# Patient Record
Sex: Male | Born: 1970
Health system: Southern US, Community
[De-identification: ages and names within clinical notes are randomized; demographics above are authoritative.]

## PROBLEM LIST (undated history)

## (undated) ENCOUNTER — Emergency Department (HOSPITAL_COMMUNITY): Admission: EM | Payer: Self-pay | Source: Home / Self Care

## (undated) ENCOUNTER — Ambulatory Visit (HOSPITAL_COMMUNITY): Payer: BLUE CROSS/BLUE SHIELD

## (undated) DIAGNOSIS — C801 Malignant (primary) neoplasm, unspecified: Secondary | ICD-10-CM

## (undated) DIAGNOSIS — N2889 Other specified disorders of kidney and ureter: Secondary | ICD-10-CM

## (undated) DIAGNOSIS — B20 Human immunodeficiency virus [HIV] disease: Secondary | ICD-10-CM

## (undated) DIAGNOSIS — K5792 Diverticulitis of intestine, part unspecified, without perforation or abscess without bleeding: Secondary | ICD-10-CM

## (undated) DIAGNOSIS — I10 Essential (primary) hypertension: Secondary | ICD-10-CM

## (undated) DIAGNOSIS — Z21 Asymptomatic human immunodeficiency virus [HIV] infection status: Secondary | ICD-10-CM

## (undated) DIAGNOSIS — J40 Bronchitis, not specified as acute or chronic: Secondary | ICD-10-CM

## (undated) DIAGNOSIS — N289 Disorder of kidney and ureter, unspecified: Principal | ICD-10-CM

## (undated) DIAGNOSIS — D849 Immunodeficiency, unspecified: Secondary | ICD-10-CM

## (undated) DIAGNOSIS — K219 Gastro-esophageal reflux disease without esophagitis: Secondary | ICD-10-CM

## (undated) HISTORY — PX: NO PAST SURGERIES: SHX2092

## (undated) HISTORY — DX: Disorder of kidney and ureter, unspecified: N28.9

---

## 1999-08-10 ENCOUNTER — Emergency Department (HOSPITAL_COMMUNITY): Admission: EM | Admit: 1999-08-10 | Discharge: 1999-08-11 | Payer: Self-pay | Admitting: Emergency Medicine

## 1999-10-29 ENCOUNTER — Emergency Department (HOSPITAL_COMMUNITY): Admission: EM | Admit: 1999-10-29 | Discharge: 1999-10-29 | Payer: Self-pay | Admitting: Emergency Medicine

## 1999-11-08 ENCOUNTER — Emergency Department (HOSPITAL_COMMUNITY): Admission: EM | Admit: 1999-11-08 | Discharge: 1999-11-08 | Payer: Self-pay | Admitting: *Deleted

## 1999-11-13 ENCOUNTER — Emergency Department (HOSPITAL_COMMUNITY): Admission: EM | Admit: 1999-11-13 | Discharge: 1999-11-13 | Payer: Self-pay | Admitting: Emergency Medicine

## 2000-01-24 ENCOUNTER — Encounter: Payer: Self-pay | Admitting: Emergency Medicine

## 2000-01-24 ENCOUNTER — Emergency Department (HOSPITAL_COMMUNITY): Admission: EM | Admit: 2000-01-24 | Discharge: 2000-01-24 | Payer: Self-pay | Admitting: Emergency Medicine

## 2000-02-01 ENCOUNTER — Emergency Department (HOSPITAL_COMMUNITY): Admission: EM | Admit: 2000-02-01 | Discharge: 2000-02-01 | Payer: Self-pay | Admitting: *Deleted

## 2002-07-22 ENCOUNTER — Emergency Department (HOSPITAL_COMMUNITY): Admission: EM | Admit: 2002-07-22 | Discharge: 2002-07-22 | Payer: Self-pay | Admitting: Emergency Medicine

## 2002-07-23 ENCOUNTER — Emergency Department (HOSPITAL_COMMUNITY): Admission: EM | Admit: 2002-07-23 | Discharge: 2002-07-23 | Payer: Self-pay | Admitting: Emergency Medicine

## 2002-07-24 ENCOUNTER — Emergency Department (HOSPITAL_COMMUNITY): Admission: EM | Admit: 2002-07-24 | Discharge: 2002-07-24 | Payer: Self-pay | Admitting: Emergency Medicine

## 2002-07-24 ENCOUNTER — Emergency Department (HOSPITAL_COMMUNITY): Admission: EM | Admit: 2002-07-24 | Discharge: 2002-07-25 | Payer: Self-pay | Admitting: Emergency Medicine

## 2002-07-25 ENCOUNTER — Inpatient Hospital Stay (HOSPITAL_COMMUNITY): Admission: EM | Admit: 2002-07-25 | Discharge: 2002-07-26 | Payer: Self-pay | Admitting: Emergency Medicine

## 2002-11-02 ENCOUNTER — Emergency Department (HOSPITAL_COMMUNITY): Admission: EM | Admit: 2002-11-02 | Discharge: 2002-11-02 | Payer: Self-pay | Admitting: Emergency Medicine

## 2003-03-02 ENCOUNTER — Emergency Department (HOSPITAL_COMMUNITY): Admission: EM | Admit: 2003-03-02 | Discharge: 2003-03-03 | Payer: Self-pay | Admitting: Emergency Medicine

## 2003-03-16 ENCOUNTER — Emergency Department (HOSPITAL_COMMUNITY): Admission: EM | Admit: 2003-03-16 | Discharge: 2003-03-16 | Payer: Self-pay | Admitting: Emergency Medicine

## 2003-08-14 ENCOUNTER — Emergency Department (HOSPITAL_COMMUNITY): Admission: EM | Admit: 2003-08-14 | Discharge: 2003-08-14 | Payer: Self-pay | Admitting: Emergency Medicine

## 2003-08-15 ENCOUNTER — Emergency Department (HOSPITAL_COMMUNITY): Admission: EM | Admit: 2003-08-15 | Discharge: 2003-08-16 | Payer: Self-pay | Admitting: Emergency Medicine

## 2003-10-06 ENCOUNTER — Emergency Department (HOSPITAL_COMMUNITY): Admission: EM | Admit: 2003-10-06 | Discharge: 2003-10-06 | Payer: Self-pay | Admitting: Family Medicine

## 2004-11-11 ENCOUNTER — Emergency Department (HOSPITAL_COMMUNITY): Admission: EM | Admit: 2004-11-11 | Discharge: 2004-11-11 | Payer: Self-pay | Admitting: Family Medicine

## 2004-11-15 ENCOUNTER — Emergency Department (HOSPITAL_COMMUNITY): Admission: EM | Admit: 2004-11-15 | Discharge: 2004-11-15 | Payer: Self-pay | Admitting: Emergency Medicine

## 2004-11-16 ENCOUNTER — Emergency Department (HOSPITAL_COMMUNITY): Admission: EM | Admit: 2004-11-16 | Discharge: 2004-11-16 | Payer: Self-pay | Admitting: Emergency Medicine

## 2004-11-18 ENCOUNTER — Observation Stay (HOSPITAL_COMMUNITY): Admission: EM | Admit: 2004-11-18 | Discharge: 2004-11-19 | Payer: Self-pay | Admitting: Emergency Medicine

## 2005-09-21 ENCOUNTER — Emergency Department (HOSPITAL_COMMUNITY): Admission: EM | Admit: 2005-09-21 | Discharge: 2005-09-21 | Payer: Self-pay | Admitting: Emergency Medicine

## 2005-09-28 ENCOUNTER — Emergency Department (HOSPITAL_COMMUNITY): Admission: EM | Admit: 2005-09-28 | Discharge: 2005-09-28 | Payer: Self-pay | Admitting: Family Medicine

## 2005-10-13 ENCOUNTER — Emergency Department (HOSPITAL_COMMUNITY): Admission: EM | Admit: 2005-10-13 | Discharge: 2005-10-13 | Payer: Self-pay | Admitting: Emergency Medicine

## 2006-03-07 ENCOUNTER — Emergency Department (HOSPITAL_COMMUNITY): Admission: EM | Admit: 2006-03-07 | Discharge: 2006-03-07 | Payer: Self-pay | Admitting: Emergency Medicine

## 2006-03-17 ENCOUNTER — Emergency Department (HOSPITAL_COMMUNITY): Admission: EM | Admit: 2006-03-17 | Discharge: 2006-03-18 | Payer: Self-pay | Admitting: Emergency Medicine

## 2006-04-09 ENCOUNTER — Emergency Department (HOSPITAL_COMMUNITY): Admission: EM | Admit: 2006-04-09 | Discharge: 2006-04-09 | Payer: Self-pay | Admitting: Family Medicine

## 2006-08-28 DIAGNOSIS — B20 Human immunodeficiency virus [HIV] disease: Secondary | ICD-10-CM | POA: Insufficient documentation

## 2006-08-29 ENCOUNTER — Emergency Department (HOSPITAL_COMMUNITY): Admission: EM | Admit: 2006-08-29 | Discharge: 2006-08-29 | Payer: Self-pay | Admitting: Emergency Medicine

## 2006-10-03 ENCOUNTER — Ambulatory Visit: Payer: Self-pay | Admitting: Internal Medicine

## 2006-10-03 ENCOUNTER — Encounter: Admission: RE | Admit: 2006-10-03 | Discharge: 2006-10-03 | Payer: Self-pay | Admitting: Internal Medicine

## 2006-10-04 ENCOUNTER — Encounter: Payer: Self-pay | Admitting: Internal Medicine

## 2006-10-10 ENCOUNTER — Emergency Department (HOSPITAL_COMMUNITY): Admission: EM | Admit: 2006-10-10 | Discharge: 2006-10-10 | Payer: Self-pay | Admitting: Emergency Medicine

## 2006-10-22 ENCOUNTER — Ambulatory Visit: Payer: Self-pay | Admitting: Internal Medicine

## 2006-10-22 DIAGNOSIS — G44209 Tension-type headache, unspecified, not intractable: Secondary | ICD-10-CM | POA: Insufficient documentation

## 2006-11-12 ENCOUNTER — Telehealth: Payer: Self-pay | Admitting: Internal Medicine

## 2006-11-15 ENCOUNTER — Telehealth: Payer: Self-pay | Admitting: Internal Medicine

## 2006-12-04 ENCOUNTER — Encounter: Admission: RE | Admit: 2006-12-04 | Discharge: 2006-12-04 | Payer: Self-pay | Admitting: Internal Medicine

## 2006-12-04 ENCOUNTER — Ambulatory Visit: Payer: Self-pay | Admitting: Internal Medicine

## 2006-12-04 LAB — CONVERTED CEMR LAB
ALT: 24 units/L (ref 0–53)
AST: 18 units/L (ref 0–37)
Albumin: 3.8 g/dL (ref 3.5–5.2)
Alkaline Phosphatase: 68 units/L (ref 39–117)
BUN: 16 mg/dL (ref 6–23)
Basophils Absolute: 0 10*3/uL (ref 0.0–0.1)
Basophils Relative: 1 % (ref 0–1)
CD4 Count: 260 microliters
CO2: 21 meq/L (ref 19–32)
Calcium: 8.6 mg/dL (ref 8.4–10.5)
Chloride: 106 meq/L (ref 96–112)
Creatinine, Ser: 1.22 mg/dL (ref 0.40–1.50)
Eosinophils Absolute: 0.2 10*3/uL (ref 0.0–0.7)
Eosinophils Relative: 5 % (ref 0–5)
Glucose, Bld: 89 mg/dL (ref 70–99)
HCT: 41.5 % (ref 39.0–52.0)
HIV 1 RNA Quant: 87 copies/mL — ABNORMAL HIGH (ref <50)
HIV-1 RNA Quant, Log: 1.94 — ABNORMAL HIGH (ref <1.70)
Hemoglobin: 14.4 g/dL (ref 13.0–17.0)
Lymphocytes Relative: 50 % — ABNORMAL HIGH (ref 12–46)
Lymphs Abs: 1.9 10*3/uL (ref 0.7–3.3)
MCHC: 34.7 g/dL (ref 30.0–36.0)
MCV: 90.8 fL (ref 78.0–100.0)
Monocytes Absolute: 0.2 10*3/uL (ref 0.2–0.7)
Monocytes Relative: 6 % (ref 3–11)
Neutro Abs: 1.4 10*3/uL — ABNORMAL LOW (ref 1.7–7.7)
Neutrophils Relative %: 38 % — ABNORMAL LOW (ref 43–77)
Platelets: 156 10*3/uL (ref 150–400)
Potassium: 4.1 meq/L (ref 3.5–5.3)
RBC: 4.57 M/uL (ref 4.22–5.81)
RDW: 13.6 % (ref 11.5–14.0)
Sodium: 134 meq/L — ABNORMAL LOW (ref 135–145)
Total Bilirubin: 0.2 mg/dL — ABNORMAL LOW (ref 0.3–1.2)
Total Protein: 8.2 g/dL (ref 6.0–8.3)
WBC: 3.8 10*3/uL — ABNORMAL LOW (ref 4.0–10.5)

## 2006-12-10 ENCOUNTER — Telehealth: Payer: Self-pay | Admitting: Internal Medicine

## 2006-12-11 ENCOUNTER — Emergency Department (HOSPITAL_COMMUNITY): Admission: EM | Admit: 2006-12-11 | Discharge: 2006-12-11 | Payer: Self-pay | Admitting: Emergency Medicine

## 2006-12-18 ENCOUNTER — Telehealth: Payer: Self-pay | Admitting: Internal Medicine

## 2006-12-18 ENCOUNTER — Encounter: Payer: Self-pay | Admitting: Internal Medicine

## 2006-12-20 ENCOUNTER — Ambulatory Visit: Payer: Self-pay | Admitting: Internal Medicine

## 2006-12-20 DIAGNOSIS — F322 Major depressive disorder, single episode, severe without psychotic features: Secondary | ICD-10-CM | POA: Insufficient documentation

## 2006-12-20 DIAGNOSIS — R0789 Other chest pain: Secondary | ICD-10-CM | POA: Insufficient documentation

## 2006-12-27 ENCOUNTER — Telehealth: Payer: Self-pay | Admitting: Internal Medicine

## 2007-01-10 ENCOUNTER — Telehealth: Payer: Self-pay | Admitting: Internal Medicine

## 2007-01-13 ENCOUNTER — Encounter: Payer: Self-pay | Admitting: Internal Medicine

## 2007-01-24 ENCOUNTER — Emergency Department (HOSPITAL_COMMUNITY): Admission: EM | Admit: 2007-01-24 | Discharge: 2007-01-24 | Payer: Self-pay | Admitting: Emergency Medicine

## 2007-02-07 ENCOUNTER — Telehealth: Payer: Self-pay | Admitting: Internal Medicine

## 2007-02-18 ENCOUNTER — Emergency Department (HOSPITAL_COMMUNITY): Admission: EM | Admit: 2007-02-18 | Discharge: 2007-02-18 | Payer: Self-pay | Admitting: Emergency Medicine

## 2007-03-03 ENCOUNTER — Encounter: Admission: RE | Admit: 2007-03-03 | Discharge: 2007-03-03 | Payer: Self-pay | Admitting: Internal Medicine

## 2007-03-03 ENCOUNTER — Ambulatory Visit: Payer: Self-pay | Admitting: Internal Medicine

## 2007-03-03 LAB — CONVERTED CEMR LAB
ALT: 34 units/L (ref 0–53)
AST: 31 units/L (ref 0–37)
Albumin: 3.9 g/dL (ref 3.5–5.2)
Alkaline Phosphatase: 85 units/L (ref 39–117)
BUN: 15 mg/dL (ref 6–23)
Basophils Absolute: 0 10*3/uL (ref 0.0–0.1)
Basophils Relative: 0 % (ref 0–1)
CO2: 22 meq/L (ref 19–32)
Calcium: 8.5 mg/dL (ref 8.4–10.5)
Chloride: 110 meq/L (ref 96–112)
Creatinine, Ser: 1.4 mg/dL (ref 0.40–1.50)
Eosinophils Absolute: 0.2 10*3/uL (ref 0.0–0.7)
Eosinophils Relative: 4 % (ref 0–5)
Glucose, Bld: 91 mg/dL (ref 70–99)
HCT: 42.1 % (ref 39.0–52.0)
HIV 1 RNA Quant: 101 copies/mL — ABNORMAL HIGH (ref <50)
HIV-1 RNA Quant, Log: 2 — ABNORMAL HIGH (ref <1.70)
Hemoglobin: 14.2 g/dL (ref 13.0–17.0)
Lymphocytes Relative: 50 % — ABNORMAL HIGH (ref 12–46)
Lymphs Abs: 2.3 10*3/uL (ref 0.7–3.3)
MCHC: 33.7 g/dL (ref 30.0–36.0)
MCV: 96.6 fL (ref 78.0–100.0)
Monocytes Absolute: 0.3 10*3/uL (ref 0.2–0.7)
Monocytes Relative: 7 % (ref 3–11)
Neutro Abs: 1.8 10*3/uL (ref 1.7–7.7)
Neutrophils Relative %: 39 % — ABNORMAL LOW (ref 43–77)
Platelets: 205 10*3/uL (ref 150–400)
Potassium: 5.1 meq/L (ref 3.5–5.3)
RBC: 4.36 M/uL (ref 4.22–5.81)
RDW: 14.7 % — ABNORMAL HIGH (ref 11.5–14.0)
Sodium: 140 meq/L (ref 135–145)
Total Bilirubin: 0.3 mg/dL (ref 0.3–1.2)
Total Protein: 7.5 g/dL (ref 6.0–8.3)
WBC: 4.6 10*3/uL (ref 4.0–10.5)

## 2007-03-07 ENCOUNTER — Telehealth: Payer: Self-pay | Admitting: Internal Medicine

## 2007-03-12 ENCOUNTER — Telehealth: Payer: Self-pay

## 2007-03-21 ENCOUNTER — Ambulatory Visit: Payer: Self-pay | Admitting: Internal Medicine

## 2007-03-21 DIAGNOSIS — B3789 Other sites of candidiasis: Secondary | ICD-10-CM | POA: Insufficient documentation

## 2007-04-01 ENCOUNTER — Emergency Department (HOSPITAL_COMMUNITY): Admission: EM | Admit: 2007-04-01 | Discharge: 2007-04-01 | Payer: Self-pay | Admitting: Emergency Medicine

## 2007-04-08 ENCOUNTER — Telehealth: Payer: Self-pay | Admitting: Internal Medicine

## 2007-04-23 ENCOUNTER — Telehealth: Payer: Self-pay | Admitting: Internal Medicine

## 2007-05-06 ENCOUNTER — Telehealth: Payer: Self-pay | Admitting: Internal Medicine

## 2007-06-04 ENCOUNTER — Telehealth: Payer: Self-pay | Admitting: Internal Medicine

## 2007-06-09 ENCOUNTER — Telehealth: Payer: Self-pay | Admitting: Internal Medicine

## 2007-06-24 ENCOUNTER — Encounter: Admission: RE | Admit: 2007-06-24 | Discharge: 2007-06-24 | Payer: Self-pay | Admitting: Internal Medicine

## 2007-06-24 ENCOUNTER — Ambulatory Visit: Payer: Self-pay | Admitting: Internal Medicine

## 2007-06-24 LAB — CONVERTED CEMR LAB
ALT: 30 units/L (ref 0–53)
AST: 21 units/L (ref 0–37)
Albumin: 3.9 g/dL (ref 3.5–5.2)
Alkaline Phosphatase: 84 units/L (ref 39–117)
BUN: 11 mg/dL (ref 6–23)
Basophils Absolute: 0 10*3/uL (ref 0.0–0.1)
Basophils Relative: 1 % (ref 0–1)
CO2: 24 meq/L (ref 19–32)
Calcium: 9.2 mg/dL (ref 8.4–10.5)
Chloride: 111 meq/L (ref 96–112)
Creatinine, Ser: 1.07 mg/dL (ref 0.40–1.50)
Eosinophils Absolute: 0.3 10*3/uL (ref 0.2–0.7)
Eosinophils Relative: 7 % — ABNORMAL HIGH (ref 0–5)
Glucose, Bld: 86 mg/dL (ref 70–99)
HCT: 43.7 % (ref 39.0–52.0)
HIV 1 RNA Quant: 59 copies/mL — ABNORMAL HIGH (ref <50)
HIV-1 RNA Quant, Log: 1.77 — ABNORMAL HIGH (ref <1.70)
Hemoglobin: 14.8 g/dL (ref 13.0–17.0)
Lymphocytes Relative: 46 % (ref 12–46)
Lymphs Abs: 2.2 10*3/uL (ref 0.7–4.0)
MCHC: 33.9 g/dL (ref 30.0–36.0)
MCV: 96.5 fL (ref 78.0–100.0)
Monocytes Absolute: 0.5 10*3/uL (ref 0.1–1.0)
Monocytes Relative: 10 % (ref 3–12)
Neutro Abs: 1.7 10*3/uL (ref 1.7–7.7)
Neutrophils Relative %: 36 % — ABNORMAL LOW (ref 43–77)
Platelets: 184 10*3/uL (ref 150–400)
Potassium: 4.1 meq/L (ref 3.5–5.3)
RBC: 4.53 M/uL (ref 4.22–5.81)
RDW: 13.3 % (ref 11.5–15.5)
Sodium: 143 meq/L (ref 135–145)
Total Bilirubin: 0.4 mg/dL (ref 0.3–1.2)
Total Protein: 7.3 g/dL (ref 6.0–8.3)
WBC: 4.6 10*3/uL (ref 4.0–10.5)

## 2007-07-08 ENCOUNTER — Ambulatory Visit: Payer: Self-pay | Admitting: Internal Medicine

## 2007-07-08 DIAGNOSIS — J029 Acute pharyngitis, unspecified: Secondary | ICD-10-CM | POA: Insufficient documentation

## 2007-07-10 ENCOUNTER — Telehealth: Payer: Self-pay | Admitting: Internal Medicine

## 2007-07-28 ENCOUNTER — Encounter: Payer: Self-pay | Admitting: Internal Medicine

## 2007-07-29 ENCOUNTER — Encounter (INDEPENDENT_AMBULATORY_CARE_PROVIDER_SITE_OTHER): Payer: Self-pay | Admitting: *Deleted

## 2007-08-06 ENCOUNTER — Telehealth: Payer: Self-pay | Admitting: Internal Medicine

## 2007-08-25 ENCOUNTER — Encounter: Payer: Self-pay | Admitting: Internal Medicine

## 2007-08-26 ENCOUNTER — Telehealth: Payer: Self-pay | Admitting: Internal Medicine

## 2008-01-22 ENCOUNTER — Encounter: Payer: Self-pay | Admitting: Internal Medicine

## 2008-02-02 ENCOUNTER — Encounter: Payer: Self-pay | Admitting: Internal Medicine

## 2008-03-03 ENCOUNTER — Ambulatory Visit: Payer: Self-pay | Admitting: Internal Medicine

## 2008-03-03 ENCOUNTER — Encounter (INDEPENDENT_AMBULATORY_CARE_PROVIDER_SITE_OTHER): Payer: Self-pay | Admitting: *Deleted

## 2008-03-03 DIAGNOSIS — M25569 Pain in unspecified knee: Secondary | ICD-10-CM | POA: Insufficient documentation

## 2008-03-19 ENCOUNTER — Encounter (INDEPENDENT_AMBULATORY_CARE_PROVIDER_SITE_OTHER): Payer: Self-pay | Admitting: *Deleted

## 2008-04-13 ENCOUNTER — Telehealth (INDEPENDENT_AMBULATORY_CARE_PROVIDER_SITE_OTHER): Payer: Self-pay | Admitting: *Deleted

## 2008-04-14 ENCOUNTER — Ambulatory Visit: Payer: Self-pay | Admitting: Internal Medicine

## 2008-04-14 LAB — CONVERTED CEMR LAB
ALT: 31 units/L (ref 0–53)
AST: 22 units/L (ref 0–37)
Albumin: 3.9 g/dL (ref 3.5–5.2)
Alkaline Phosphatase: 87 units/L (ref 39–117)
BUN: 15 mg/dL (ref 6–23)
Basophils Absolute: 0 10*3/uL (ref 0.0–0.1)
Basophils Relative: 0 % (ref 0–1)
CO2: 21 meq/L (ref 19–32)
Calcium: 8.8 mg/dL (ref 8.4–10.5)
Chloride: 111 meq/L (ref 96–112)
Creatinine, Ser: 1.11 mg/dL (ref 0.40–1.50)
Eosinophils Absolute: 0.3 10*3/uL (ref 0.0–0.7)
Eosinophils Relative: 5 % (ref 0–5)
Glucose, Bld: 78 mg/dL (ref 70–99)
HCT: 42.3 % (ref 39.0–52.0)
HIV 1 RNA Quant: 293 copies/mL — ABNORMAL HIGH (ref <50)
HIV-1 RNA Quant, Log: 2.47 — ABNORMAL HIGH (ref <1.70)
Hemoglobin: 14.1 g/dL (ref 13.0–17.0)
Lymphocytes Relative: 42 % (ref 12–46)
Lymphs Abs: 2.3 10*3/uL (ref 0.7–4.0)
MCHC: 33.3 g/dL (ref 30.0–36.0)
MCV: 96.8 fL (ref 78.0–100.0)
Monocytes Absolute: 0.4 10*3/uL (ref 0.1–1.0)
Monocytes Relative: 6 % (ref 3–12)
Neutro Abs: 2.5 10*3/uL (ref 1.7–7.7)
Neutrophils Relative %: 46 % (ref 43–77)
Platelets: 182 10*3/uL (ref 150–400)
Potassium: 4.1 meq/L (ref 3.5–5.3)
RBC: 4.37 M/uL (ref 4.22–5.81)
RDW: 14 % (ref 11.5–15.5)
Sodium: 142 meq/L (ref 135–145)
Total Bilirubin: 0.2 mg/dL — ABNORMAL LOW (ref 0.3–1.2)
Total Protein: 7.1 g/dL (ref 6.0–8.3)
WBC: 5.5 10*3/uL (ref 4.0–10.5)

## 2008-04-19 ENCOUNTER — Telehealth (INDEPENDENT_AMBULATORY_CARE_PROVIDER_SITE_OTHER): Payer: Self-pay | Admitting: *Deleted

## 2008-04-28 ENCOUNTER — Ambulatory Visit: Payer: Self-pay | Admitting: Internal Medicine

## 2008-04-28 DIAGNOSIS — I10 Essential (primary) hypertension: Secondary | ICD-10-CM | POA: Insufficient documentation

## 2008-04-29 ENCOUNTER — Encounter: Payer: Self-pay | Admitting: Internal Medicine

## 2008-05-13 ENCOUNTER — Telehealth (INDEPENDENT_AMBULATORY_CARE_PROVIDER_SITE_OTHER): Payer: Self-pay | Admitting: *Deleted

## 2008-06-10 ENCOUNTER — Telehealth (INDEPENDENT_AMBULATORY_CARE_PROVIDER_SITE_OTHER): Payer: Self-pay | Admitting: *Deleted

## 2008-06-20 ENCOUNTER — Emergency Department (HOSPITAL_COMMUNITY): Admission: EM | Admit: 2008-06-20 | Discharge: 2008-06-21 | Payer: Self-pay | Admitting: Emergency Medicine

## 2008-06-22 ENCOUNTER — Emergency Department (HOSPITAL_COMMUNITY): Admission: EM | Admit: 2008-06-22 | Discharge: 2008-06-22 | Payer: Self-pay | Admitting: Emergency Medicine

## 2008-06-23 ENCOUNTER — Emergency Department (HOSPITAL_COMMUNITY): Admission: EM | Admit: 2008-06-23 | Discharge: 2008-06-23 | Payer: Self-pay | Admitting: Emergency Medicine

## 2008-07-07 ENCOUNTER — Telehealth (INDEPENDENT_AMBULATORY_CARE_PROVIDER_SITE_OTHER): Payer: Self-pay | Admitting: *Deleted

## 2008-07-28 ENCOUNTER — Ambulatory Visit: Payer: Self-pay | Admitting: Internal Medicine

## 2008-07-28 LAB — CONVERTED CEMR LAB
BUN: 7 mg/dL (ref 6–23)
CO2: 23 meq/L (ref 19–32)
Creatinine, Ser: 1.04 mg/dL (ref 0.40–1.50)
Eosinophils Relative: 6 % — ABNORMAL HIGH (ref 0–5)
Glucose, Bld: 65 mg/dL — ABNORMAL LOW (ref 70–99)
HCT: 40.3 % (ref 39.0–52.0)
HIV 1 RNA Quant: 219 copies/mL — ABNORMAL HIGH (ref <48)
HIV-1 RNA Quant, Log: 2.34 — ABNORMAL HIGH (ref <1.68)
Hemoglobin: 13.5 g/dL (ref 13.0–17.0)
Lymphocytes Relative: 44 % (ref 12–46)
Lymphs Abs: 2.1 10*3/uL (ref 0.7–4.0)
Monocytes Absolute: 0.4 10*3/uL (ref 0.1–1.0)
Monocytes Relative: 8 % (ref 3–12)
Total Bilirubin: 0.4 mg/dL (ref 0.3–1.2)
Total Protein: 7.4 g/dL (ref 6.0–8.3)
WBC: 4.9 10*3/uL (ref 4.0–10.5)

## 2008-08-04 ENCOUNTER — Telehealth (INDEPENDENT_AMBULATORY_CARE_PROVIDER_SITE_OTHER): Payer: Self-pay | Admitting: *Deleted

## 2008-08-18 ENCOUNTER — Ambulatory Visit: Payer: Self-pay | Admitting: Internal Medicine

## 2008-09-01 ENCOUNTER — Telehealth (INDEPENDENT_AMBULATORY_CARE_PROVIDER_SITE_OTHER): Payer: Self-pay | Admitting: *Deleted

## 2008-09-30 ENCOUNTER — Telehealth (INDEPENDENT_AMBULATORY_CARE_PROVIDER_SITE_OTHER): Payer: Self-pay | Admitting: *Deleted

## 2008-10-04 ENCOUNTER — Emergency Department (HOSPITAL_COMMUNITY): Admission: EM | Admit: 2008-10-04 | Discharge: 2008-10-04 | Payer: Self-pay | Admitting: Family Medicine

## 2008-10-20 ENCOUNTER — Encounter (INDEPENDENT_AMBULATORY_CARE_PROVIDER_SITE_OTHER): Payer: Self-pay | Admitting: *Deleted

## 2008-11-16 ENCOUNTER — Ambulatory Visit: Payer: Self-pay | Admitting: Internal Medicine

## 2008-11-16 LAB — CONVERTED CEMR LAB
ALT: 37 units/L (ref 0–53)
AST: 25 units/L (ref 0–37)
Basophils Relative: 1 % (ref 0–1)
Calcium: 8.8 mg/dL (ref 8.4–10.5)
Chloride: 110 meq/L (ref 96–112)
Creatinine, Ser: 1.04 mg/dL (ref 0.40–1.50)
Eosinophils Absolute: 0.2 10*3/uL (ref 0.0–0.7)
Hemoglobin: 14.7 g/dL (ref 13.0–17.0)
MCHC: 34.1 g/dL (ref 30.0–36.0)
MCV: 95.8 fL (ref 78.0–100.0)
Monocytes Absolute: 0.4 10*3/uL (ref 0.1–1.0)
Monocytes Relative: 9 % (ref 3–12)
Neutrophils Relative %: 41 % — ABNORMAL LOW (ref 43–77)
RBC: 4.5 M/uL (ref 4.22–5.81)

## 2008-11-25 ENCOUNTER — Telehealth (INDEPENDENT_AMBULATORY_CARE_PROVIDER_SITE_OTHER): Payer: Self-pay | Admitting: *Deleted

## 2008-12-01 ENCOUNTER — Ambulatory Visit: Payer: Self-pay | Admitting: Internal Medicine

## 2008-12-01 DIAGNOSIS — K648 Other hemorrhoids: Secondary | ICD-10-CM | POA: Insufficient documentation

## 2008-12-21 ENCOUNTER — Ambulatory Visit: Payer: Self-pay | Admitting: Internal Medicine

## 2008-12-21 DIAGNOSIS — K6289 Other specified diseases of anus and rectum: Secondary | ICD-10-CM | POA: Insufficient documentation

## 2009-01-10 ENCOUNTER — Emergency Department (HOSPITAL_COMMUNITY): Admission: EM | Admit: 2009-01-10 | Discharge: 2009-01-10 | Payer: Self-pay | Admitting: *Deleted

## 2009-01-10 ENCOUNTER — Encounter: Payer: Self-pay | Admitting: Internal Medicine

## 2009-01-12 ENCOUNTER — Telehealth (INDEPENDENT_AMBULATORY_CARE_PROVIDER_SITE_OTHER): Payer: Self-pay | Admitting: *Deleted

## 2009-02-08 ENCOUNTER — Telehealth (INDEPENDENT_AMBULATORY_CARE_PROVIDER_SITE_OTHER): Payer: Self-pay | Admitting: *Deleted

## 2009-02-18 ENCOUNTER — Telehealth (INDEPENDENT_AMBULATORY_CARE_PROVIDER_SITE_OTHER): Payer: Self-pay | Admitting: *Deleted

## 2009-02-18 ENCOUNTER — Telehealth: Payer: Self-pay | Admitting: Internal Medicine

## 2009-02-18 ENCOUNTER — Emergency Department (HOSPITAL_COMMUNITY): Admission: EM | Admit: 2009-02-18 | Discharge: 2009-02-18 | Payer: Self-pay | Admitting: Emergency Medicine

## 2009-03-01 ENCOUNTER — Ambulatory Visit: Payer: Self-pay | Admitting: Internal Medicine

## 2009-03-01 LAB — CONVERTED CEMR LAB
Albumin: 3.8 g/dL (ref 3.5–5.2)
BUN: 9 mg/dL (ref 6–23)
Basophils Absolute: 0.1 10*3/uL (ref 0.0–0.1)
CO2: 23 meq/L (ref 19–32)
Calcium: 8.6 mg/dL (ref 8.4–10.5)
Chloride: 111 meq/L (ref 96–112)
Glucose, Bld: 76 mg/dL (ref 70–99)
HIV 1 RNA Quant: 171 copies/mL — ABNORMAL HIGH (ref <48)
HIV-1 RNA Quant, Log: 2.23 — ABNORMAL HIGH (ref <1.68)
Hemoglobin: 13.3 g/dL (ref 13.0–17.0)
Lymphocytes Relative: 51 % — ABNORMAL HIGH (ref 12–46)
Monocytes Absolute: 0.4 10*3/uL (ref 0.1–1.0)
Monocytes Relative: 6 % (ref 3–12)
Neutro Abs: 2.2 10*3/uL (ref 1.7–7.7)
Potassium: 4 meq/L (ref 3.5–5.3)
RBC: 4.18 M/uL — ABNORMAL LOW (ref 4.22–5.81)

## 2009-03-07 ENCOUNTER — Telehealth (INDEPENDENT_AMBULATORY_CARE_PROVIDER_SITE_OTHER): Payer: Self-pay | Admitting: *Deleted

## 2009-03-15 ENCOUNTER — Encounter (INDEPENDENT_AMBULATORY_CARE_PROVIDER_SITE_OTHER): Payer: Self-pay | Admitting: *Deleted

## 2009-03-16 ENCOUNTER — Ambulatory Visit: Payer: Self-pay | Admitting: Internal Medicine

## 2009-04-01 ENCOUNTER — Telehealth (INDEPENDENT_AMBULATORY_CARE_PROVIDER_SITE_OTHER): Payer: Self-pay | Admitting: *Deleted

## 2009-04-25 ENCOUNTER — Telehealth (INDEPENDENT_AMBULATORY_CARE_PROVIDER_SITE_OTHER): Payer: Self-pay | Admitting: *Deleted

## 2009-05-18 ENCOUNTER — Telehealth (INDEPENDENT_AMBULATORY_CARE_PROVIDER_SITE_OTHER): Payer: Self-pay | Admitting: *Deleted

## 2009-06-01 ENCOUNTER — Emergency Department (HOSPITAL_COMMUNITY): Admission: EM | Admit: 2009-06-01 | Discharge: 2009-06-01 | Payer: Self-pay | Admitting: Emergency Medicine

## 2009-06-09 ENCOUNTER — Ambulatory Visit: Payer: Self-pay | Admitting: Internal Medicine

## 2009-06-09 LAB — CONVERTED CEMR LAB
ALT: 19 units/L (ref 0–53)
AST: 22 units/L (ref 0–37)
Albumin: 3.8 g/dL (ref 3.5–5.2)
Alkaline Phosphatase: 84 units/L (ref 39–117)
Basophils Absolute: 0 10*3/uL (ref 0.0–0.1)
Glucose, Bld: 83 mg/dL (ref 70–99)
Hemoglobin: 14 g/dL (ref 13.0–17.0)
Lymphocytes Relative: 45 % (ref 12–46)
Monocytes Absolute: 0.5 10*3/uL (ref 0.1–1.0)
Neutro Abs: 2 10*3/uL (ref 1.7–7.7)
Neutrophils Relative %: 40 % — ABNORMAL LOW (ref 43–77)
Potassium: 4.3 meq/L (ref 3.5–5.3)
RBC: 4.35 M/uL (ref 4.22–5.81)
RDW: 13.2 % (ref 11.5–15.5)
Sodium: 142 meq/L (ref 135–145)
Total Bilirubin: 0.3 mg/dL (ref 0.3–1.2)
Total Protein: 6.5 g/dL (ref 6.0–8.3)

## 2009-06-10 ENCOUNTER — Ambulatory Visit: Payer: Self-pay | Admitting: Internal Medicine

## 2009-06-27 ENCOUNTER — Telehealth (INDEPENDENT_AMBULATORY_CARE_PROVIDER_SITE_OTHER): Payer: Self-pay | Admitting: *Deleted

## 2009-07-12 ENCOUNTER — Telehealth: Payer: Self-pay | Admitting: Internal Medicine

## 2009-07-13 ENCOUNTER — Ambulatory Visit: Payer: Self-pay | Admitting: Internal Medicine

## 2009-07-13 ENCOUNTER — Ambulatory Visit (HOSPITAL_COMMUNITY): Admission: RE | Admit: 2009-07-13 | Discharge: 2009-07-13 | Payer: Self-pay | Admitting: Internal Medicine

## 2009-07-26 ENCOUNTER — Telehealth (INDEPENDENT_AMBULATORY_CARE_PROVIDER_SITE_OTHER): Payer: Self-pay | Admitting: *Deleted

## 2009-08-16 ENCOUNTER — Emergency Department (HOSPITAL_COMMUNITY): Admission: EM | Admit: 2009-08-16 | Discharge: 2009-08-17 | Payer: Self-pay | Admitting: Emergency Medicine

## 2009-09-15 ENCOUNTER — Telehealth (INDEPENDENT_AMBULATORY_CARE_PROVIDER_SITE_OTHER): Payer: Self-pay | Admitting: *Deleted

## 2009-10-04 ENCOUNTER — Encounter (INDEPENDENT_AMBULATORY_CARE_PROVIDER_SITE_OTHER): Payer: Self-pay | Admitting: *Deleted

## 2009-10-10 ENCOUNTER — Telehealth (INDEPENDENT_AMBULATORY_CARE_PROVIDER_SITE_OTHER): Payer: Self-pay | Admitting: *Deleted

## 2009-11-09 ENCOUNTER — Ambulatory Visit: Payer: Self-pay | Admitting: Internal Medicine

## 2009-11-09 DIAGNOSIS — M25539 Pain in unspecified wrist: Secondary | ICD-10-CM | POA: Insufficient documentation

## 2009-11-09 LAB — CONVERTED CEMR LAB
AST: 26 units/L (ref 0–37)
Albumin: 4.1 g/dL (ref 3.5–5.2)
Alkaline Phosphatase: 90 units/L (ref 39–117)
BUN: 13 mg/dL (ref 6–23)
Basophils Absolute: 0 10*3/uL (ref 0.0–0.1)
Calcium: 8.8 mg/dL (ref 8.4–10.5)
Creatinine, Ser: 1.08 mg/dL (ref 0.40–1.50)
Eosinophils Absolute: 0.3 10*3/uL (ref 0.0–0.7)
Eosinophils Relative: 6 % — ABNORMAL HIGH (ref 0–5)
Glucose, Bld: 75 mg/dL (ref 70–99)
HCT: 42.5 % (ref 39.0–52.0)
HDL: 37 mg/dL — ABNORMAL LOW (ref >39)
Hemoglobin: 14.6 g/dL (ref 13.0–17.0)
LDL Cholesterol: 129 mg/dL — ABNORMAL HIGH (ref 0–99)
MCHC: 34.4 g/dL (ref 30.0–36.0)
MCV: 95.9 fL (ref 78.0–100.0)
Monocytes Absolute: 0.5 10*3/uL (ref 0.1–1.0)
Platelets: 170 10*3/uL (ref 150–400)
Potassium: 4.4 meq/L (ref 3.5–5.3)
RDW: 13.8 % (ref 11.5–15.5)
Total CHOL/HDL Ratio: 5
Triglycerides: 96 mg/dL (ref <150)

## 2009-11-14 ENCOUNTER — Telehealth: Payer: Self-pay | Admitting: Internal Medicine

## 2009-12-09 ENCOUNTER — Telehealth: Payer: Self-pay | Admitting: Internal Medicine

## 2009-12-23 ENCOUNTER — Encounter (INDEPENDENT_AMBULATORY_CARE_PROVIDER_SITE_OTHER): Payer: Self-pay | Admitting: Internal Medicine

## 2009-12-23 ENCOUNTER — Emergency Department (HOSPITAL_COMMUNITY): Admission: EM | Admit: 2009-12-23 | Discharge: 2009-12-23 | Payer: Self-pay | Admitting: Emergency Medicine

## 2010-01-05 ENCOUNTER — Telehealth: Payer: Self-pay | Admitting: Internal Medicine

## 2010-01-18 ENCOUNTER — Ambulatory Visit: Payer: Self-pay | Admitting: Internal Medicine

## 2010-01-18 DIAGNOSIS — L0203 Carbuncle of face: Secondary | ICD-10-CM

## 2010-01-18 DIAGNOSIS — L0202 Furuncle of face: Secondary | ICD-10-CM | POA: Insufficient documentation

## 2010-02-06 ENCOUNTER — Ambulatory Visit: Payer: Self-pay | Admitting: Internal Medicine

## 2010-02-06 LAB — CONVERTED CEMR LAB
AST: 40 units/L — ABNORMAL HIGH (ref 0–37)
Albumin: 3.9 g/dL (ref 3.5–5.2)
Alkaline Phosphatase: 116 units/L (ref 39–117)
BUN: 12 mg/dL (ref 6–23)
Basophils Relative: 1 % (ref 0–1)
Creatinine, Ser: 1.13 mg/dL (ref 0.40–1.50)
Eosinophils Absolute: 0.3 10*3/uL (ref 0.0–0.7)
Eosinophils Relative: 5 % (ref 0–5)
Glucose, Bld: 98 mg/dL (ref 70–99)
HCT: 42.7 % (ref 39.0–52.0)
Lymphs Abs: 2.6 10*3/uL (ref 0.7–4.0)
MCHC: 34 g/dL (ref 30.0–36.0)
MCV: 96.4 fL (ref 78.0–100.0)
Monocytes Relative: 8 % (ref 3–12)
RBC: 4.43 M/uL (ref 4.22–5.81)
Total Bilirubin: 0.3 mg/dL (ref 0.3–1.2)
WBC: 7.5 10*3/uL (ref 4.0–10.5)

## 2010-02-13 ENCOUNTER — Telehealth: Payer: Self-pay | Admitting: Internal Medicine

## 2010-02-17 ENCOUNTER — Telehealth: Payer: Self-pay | Admitting: Internal Medicine

## 2010-02-20 ENCOUNTER — Ambulatory Visit: Payer: Self-pay | Admitting: Internal Medicine

## 2010-03-08 ENCOUNTER — Ambulatory Visit: Payer: Self-pay | Admitting: Internal Medicine

## 2010-03-09 ENCOUNTER — Emergency Department (HOSPITAL_COMMUNITY): Admission: EM | Admit: 2010-03-09 | Discharge: 2010-03-09 | Payer: Self-pay | Admitting: Emergency Medicine

## 2010-03-16 ENCOUNTER — Telehealth (INDEPENDENT_AMBULATORY_CARE_PROVIDER_SITE_OTHER): Payer: Self-pay | Admitting: *Deleted

## 2010-04-07 ENCOUNTER — Telehealth: Payer: Self-pay | Admitting: Internal Medicine

## 2010-04-27 ENCOUNTER — Telehealth: Payer: Self-pay | Admitting: Internal Medicine

## 2010-04-28 ENCOUNTER — Telehealth: Payer: Self-pay | Admitting: Internal Medicine

## 2010-05-01 ENCOUNTER — Ambulatory Visit: Payer: Self-pay | Admitting: Internal Medicine

## 2010-05-01 LAB — CONVERTED CEMR LAB
ALT: 35 units/L (ref 0–53)
AST: 27 units/L (ref 0–37)
Albumin: 4.3 g/dL (ref 3.5–5.2)
Alkaline Phosphatase: 89 units/L (ref 39–117)
BUN: 13 mg/dL (ref 6–23)
Basophils Absolute: 0 10*3/uL (ref 0.0–0.1)
Basophils Relative: 1 % (ref 0–1)
Creatinine, Ser: 1.11 mg/dL (ref 0.40–1.50)
Eosinophils Absolute: 0.3 10*3/uL (ref 0.0–0.7)
MCHC: 34.6 g/dL (ref 30.0–36.0)
MCV: 94.4 fL (ref 78.0–100.0)
Monocytes Relative: 8 % (ref 3–12)
Neutrophils Relative %: 65 % (ref 43–77)
Potassium: 3.8 meq/L (ref 3.5–5.3)
RBC: 4.63 M/uL (ref 4.22–5.81)
RDW: 13.4 % (ref 11.5–15.5)

## 2010-05-03 ENCOUNTER — Emergency Department (HOSPITAL_COMMUNITY): Admission: EM | Admit: 2010-05-03 | Discharge: 2010-05-03 | Payer: Self-pay | Admitting: Emergency Medicine

## 2010-05-19 ENCOUNTER — Telehealth (INDEPENDENT_AMBULATORY_CARE_PROVIDER_SITE_OTHER): Payer: Self-pay | Admitting: Licensed Clinical Social Worker

## 2010-06-02 ENCOUNTER — Telehealth (INDEPENDENT_AMBULATORY_CARE_PROVIDER_SITE_OTHER): Payer: Self-pay | Admitting: *Deleted

## 2010-06-21 ENCOUNTER — Encounter (INDEPENDENT_AMBULATORY_CARE_PROVIDER_SITE_OTHER): Payer: Self-pay | Admitting: *Deleted

## 2010-07-14 ENCOUNTER — Emergency Department (HOSPITAL_COMMUNITY)
Admission: EM | Admit: 2010-07-14 | Discharge: 2010-07-14 | Payer: Self-pay | Source: Home / Self Care | Admitting: Emergency Medicine

## 2010-08-07 ENCOUNTER — Encounter: Payer: Self-pay | Admitting: Internal Medicine

## 2010-08-07 ENCOUNTER — Ambulatory Visit
Admission: RE | Admit: 2010-08-07 | Discharge: 2010-08-07 | Payer: Self-pay | Source: Home / Self Care | Attending: Internal Medicine | Admitting: Internal Medicine

## 2010-08-07 LAB — CONVERTED CEMR LAB
Albumin: 4.1 g/dL (ref 3.5–5.2)
Basophils Relative: 1 % (ref 0–1)
Blood, UA: NEGATIVE
CO2: 24 meq/L (ref 19–32)
GC Probe Amp, Urine: NEGATIVE
Glucose, Bld: 78 mg/dL (ref 70–99)
HIV 1 RNA Quant: <20 copies/mL (ref <20)
HIV-1 RNA Quant, Log: <1.3 (ref <1.30)
Leukocytes, UA: NEGATIVE
Lymphocytes Relative: 41 % (ref 12–46)
Lymphs Abs: 2.2 10*3/uL (ref 0.7–4.0)
Monocytes Relative: 9 % (ref 3–12)
Neutro Abs: 2.4 10*3/uL (ref 1.7–7.7)
Neutrophils Relative %: 45 % (ref 43–77)
Potassium: 4.3 meq/L (ref 3.5–5.3)
Protein, ur: NEGATIVE mg/dL
RBC: 4.77 M/uL (ref 4.22–5.81)
Sodium: 140 meq/L (ref 135–145)
Total Protein: 6.9 g/dL (ref 6.0–8.3)
Urine Glucose: NEGATIVE mg/dL
WBC: 5.3 10*3/uL (ref 4.0–10.5)

## 2010-08-14 LAB — T-HELPER CELL (CD4) - (RCID CLINIC ONLY)
CD4 % Helper T Cell: 23 % — ABNORMAL LOW (ref 33–55)
CD4 T Cell Abs: 510 uL (ref 400–2700)

## 2010-08-24 ENCOUNTER — Ambulatory Visit: Admit: 2010-08-24 | Payer: Self-pay | Admitting: Internal Medicine

## 2010-08-25 ENCOUNTER — Telehealth (INDEPENDENT_AMBULATORY_CARE_PROVIDER_SITE_OTHER): Payer: Self-pay | Admitting: Licensed Clinical Social Worker

## 2010-08-25 ENCOUNTER — Ambulatory Visit
Admission: RE | Admit: 2010-08-25 | Discharge: 2010-08-25 | Payer: Self-pay | Source: Home / Self Care | Attending: Adult Health | Admitting: Adult Health

## 2010-08-27 LAB — CONVERTED CEMR LAB
ALT: 24 units/L (ref 0–53)
AST: 28 units/L (ref 0–37)
Albumin: 3.7 g/dL (ref 3.5–5.2)
Alkaline Phosphatase: 64 units/L (ref 39–117)
BUN: 15 mg/dL (ref 6–23)
Basophils Absolute: 0 10*3/uL (ref 0.0–0.1)
Basophils Relative: 1 % (ref 0–1)
Bilirubin Urine: NEGATIVE
CD4 Count: 381 microliters
CO2: 23 meq/L (ref 19–32)
Calcium: 8.7 mg/dL (ref 8.4–10.5)
Chlamydia, Swab/Urine, PCR: NEGATIVE
Chloride: 105 meq/L (ref 96–112)
Cholesterol: 165 mg/dL (ref 0–200)
Creatinine, Ser: 1.08 mg/dL (ref 0.40–1.50)
Eosinophils Absolute: 0.3 10*3/uL (ref 0.0–0.7)
Eosinophils Relative: 6 % — ABNORMAL HIGH (ref 0–5)
GC Probe Amp, Urine: NEGATIVE
Glucose, Bld: 83 mg/dL (ref 70–99)
HCT: 42.9 % (ref 39.0–52.0)
HCV Ab: NEGATIVE
HCV Ab: NEGATIVE
HDL: 42 mg/dL (ref >39)
HIV 1 RNA Quant: 22900 copies/mL — ABNORMAL HIGH (ref <50)
HIV-1 RNA Quant, Log: 4.36 — ABNORMAL HIGH (ref <1.70)
HIV-1 antibody: POSITIVE — AB
HIV-2 Ab: UNDETERMINED — AB
HIV: REACTIVE
Hemoglobin, Urine: NEGATIVE
Hemoglobin: 14.1 g/dL (ref 13.0–17.0)
Hep B Core Total Ab: NEGATIVE
Hep B S Ab: NEGATIVE
Hep B S Ab: NEGATIVE
Hepatitis B Surface Ag: NEGATIVE
Hepatitis B Surface Ag: NEGATIVE
Ketones, ur: NEGATIVE mg/dL
LDL Cholesterol: 108 mg/dL — ABNORMAL HIGH (ref 0–99)
Leukocytes, UA: NEGATIVE
Lymphocytes Relative: 42 % (ref 12–46)
Lymphs Abs: 1.8 10*3/uL (ref 0.7–3.3)
MCHC: 32.9 g/dL (ref 30.0–36.0)
MCV: 94.3 fL (ref 78.0–100.0)
Monocytes Absolute: 0.4 10*3/uL (ref 0.2–0.7)
Monocytes Relative: 8 % (ref 3–11)
Neutro Abs: 1.9 10*3/uL (ref 1.7–7.7)
Neutrophils Relative %: 44 % (ref 43–77)
Nitrite: NEGATIVE
Platelets: 126 10*3/uL — ABNORMAL LOW (ref 150–400)
Potassium: 4.4 meq/L (ref 3.5–5.3)
Protein, ur: NEGATIVE mg/dL
RBC: 4.55 M/uL (ref 4.22–5.81)
RDW: 13.9 % (ref 11.5–14.0)
Sodium: 139 meq/L (ref 135–145)
Specific Gravity, Urine: 1.022 (ref 1.005–1.03)
Total Bilirubin: 0.3 mg/dL (ref 0.3–1.2)
Total CHOL/HDL Ratio: 3.9
Total Protein: 8.4 g/dL — ABNORMAL HIGH (ref 6.0–8.3)
Triglycerides: 74 mg/dL (ref <150)
Urine Glucose: NEGATIVE mg/dL
Urobilinogen, UA: 0.2 (ref 0.0–1.0)
VLDL: 15 mg/dL (ref 0–40)
WBC: 4.2 10*3/uL (ref 4.0–10.5)
pH: 7 (ref 5.0–8.0)

## 2010-08-29 NOTE — Progress Notes (Signed)
Summary: [facility]/pt assist med arrived for June via [facility]  Phone Note Refill Request      Prescriptions: ATRIPLA 600-200-300 MG TABS (EFAVIRENZ-EMTRICITAB-TENOFOVIR) Take 1 tablet by mouth at bedtime  #30 x 0   Entered by:   Paulo Fruit  BS,CPht II,MPH   Authorized by:   Yisroel Ramming MD   Signed by:   Paulo Fruit  BS,CPht II,MPH on 01/05/2010   Method used:   Samples Given   RxID:   [id number]  Patient Assist Medication Verification: Medication name: [name] # [id number] Tech approval:MLD Prescription/Samples picked up by: patient Paulo Fruit  BS,CPht II,MPH  January 05, 2010 9:50 AM

## 2010-08-29 NOTE — Assessment & Plan Note (Signed)
Summary: BOIL/VS   CC:  pt. c/o boil left buttocks x 1 day.  History of Present Illness: Pt c/o recurrent boil on buttock.  It is somewhat painful.  Preventive Screening-Counseling & Management  Alcohol-Tobacco     Alcohol drinks/day: 0     Smoking Status: current     Smoking Cessation Counseling: yes     Smoke Cessation Stage: precontemplative     Packs/Day: 0.5     Year Started: AT THE AGE OF 15     Passive Smoke Exposure: yes  Caffeine-Diet-Exercise     Caffeine use/day: soda 2 per day     Does Patient Exercise: yes     Type of exercise: walking     Exercise (avg: min/session): >60     Times/week: 5  Hep-HIV-STD-Contraception     HIV Risk: no  Safety-Violence-Falls     Seat Belt Use: yes      Sexual History:  n/a.        Drug Use:  former.        Blood Transfusions:  no.        Travel History:  no.    Comments: pt. declined condoms   Updated Prior Medication List: ATRIPLA 600-200-300 MG TABS (EFAVIRENZ-EMTRICITAB-TENOFOVIR) Take 1 tablet by mouth at bedtime DOXYCYCLINE HYCLATE 100 MG CAPS (DOXYCYCLINE HYCLATE) Take 1 capsule by mouth two times a day  Current Allergies (reviewed today): ! IBUPROFEN Past History:  Past Medical History: Last updated: 10/22/2006 HIV disease  Review of Systems  The patient denies anorexia, fever, and weight loss.    Vital Signs:  Patient profile:   40 year old male Height:      65 inches (165.10 cm) Weight:      216.8 pounds (98.55 kg) BMI:     36.21 Temp:     98.6 degrees F (37.00 degrees C) oral Pulse rate:   81 / minute BP sitting:   132 / 88  (left arm)  Vitals Entered By: Wendall Mola CMA Duncan Dull) (March 08, 2010 10:39 AM) CC: pt. c/o boil left buttocks x 1 day Is Patient Diabetic? No Pain Assessment Patient in pain? yes     Location: buttocks Intensity: 3 Type: stinging Onset of pain  Constant Nutritional Status BMI of > 30 = obese Nutritional Status Detail appetite "normal"  Does patient  need assistance? Functional Status Self care Ambulation Normal Comments no missed doses of Atripla per patient   Physical Exam  General:  alert, well-developed, well-nourished, and well-hydrated.   Head:  normocephalic and atraumatic.   Rectal:  indurated area on left buttock that is slightly tender no surrounding erythema    Impression & Recommendations:  Problem # 1:  CARBUNCLE AND FURUNCLE OF FACE (ICD-680.0) will treat with doxy for 10 days Orders: Est. Patient Level III (60454)  Problem # 2:  HIV DISEASE (ICD-042) continue current meds and f/u as scheduled. Diagnostics Reviewed:  HIV: HIV positive - not AIDS (11/09/2009)   HIV-Western blot: POSITIVE (10/03/2006)   CD4: 570 (02/07/2010)   WBC: 7.5 (02/06/2010)   Hgb: 14.5 (02/06/2010)   HCT: 42.7 (02/06/2010)   Platelets: 182 (02/06/2010) HIV-1 RNA: <48 copies/mL (02/06/2010)   HBSAg: NEG (10/03/2006) Prescriptions: DOXYCYCLINE HYCLATE 100 MG CAPS (DOXYCYCLINE HYCLATE) Take 1 capsule by mouth two times a day  #20 x 0   Entered and Authorized by:   Yisroel Ramming MD   Signed by:   Yisroel Ramming MD on 03/08/2010   Method used:   Print then Give  to Patient   RxID:   [id number]

## 2010-08-29 NOTE — Miscellaneous (Signed)
Summary: clinical update/ryan whtie NCADAP appr til 10/28/10  Clinical Lists Changes  Observations: Added new observation of AIDSDAP: Yes 2011 (10/04/2009 10:29)

## 2010-08-29 NOTE — Miscellaneous (Signed)
Summary: RW Update  Clinical Lists Changes  Observations: Added new observation of AIDSDAP: Yes 2012 (06/21/2010 9:53) Added new observation of YEARLYEXPEN: 5801  (06/21/2010 9:53) Added new observation of PCTFPL: 146.04  (06/21/2010 9:53) Added new observation of HOUSEINCOME: 16109  (06/21/2010 9:53) Added new observation of FINASSESSDT: 05/01/2010  (06/21/2010 9:53)  Appended Document: RW Update ADAP should be: Yes 2011

## 2010-08-29 NOTE — Assessment & Plan Note (Signed)
Summary: boil on forehead/jc   History of Present Illness: Pt c/o painful lump on his forhead for several days. He has been putting hear on it.   Updated Prior Medication List: ATRIPLA 600-200-300 MG TABS (EFAVIRENZ-EMTRICITAB-TENOFOVIR) Take 1 tablet by mouth at bedtime PROCHLORPERAZINE MALEATE 5 MG TABS (PROCHLORPERAZINE MALEATE) Take 1-2 tabs by mouth every 8 hours. ULTRAM 50 MG TABS (TRAMADOL HCL) Take 1 tab by mouth every 8 hours as needed for pain. DOXYCYCLINE HYCLATE 100 MG CAPS (DOXYCYCLINE HYCLATE) Take 1 capsule by mouth two times a day  Current Allergies: ! IBUPROFEN Past History:  Past Medical History: Last updated: 10/22/2006 HIV disease  Review of Systems  The patient denies anorexia, fever, and weight loss.    Physical Exam  General:  alert, well-developed, well-nourished, and well-hydrated.   Head:  small boil on right forehead no surrounding erythema.   Impression & Recommendations:  Problem # 1:  CARBUNCLE AND FURUNCLE OF FACE (ICD-680.0)  doxycycline heat  Orders: Est. Patient Level II (16109)

## 2010-08-29 NOTE — Miscellaneous (Signed)
  Clinical Lists Changes  Observations: Added new observation of AIDSDAP: Yes 2011 (06/21/2010 12:52)

## 2010-08-29 NOTE — Assessment & Plan Note (Signed)
Summary: 22month f/u/vs   CC:  follow-up visit and lab work and complaining of right wrist pain x 2 days.  History of Present Illness: Pt c/o pain in his left wrist.  No injury or new activity.  The pain started yesterday. He has not tried anything for it.  Depression History:      The patient is having a depressed mood most of the day and has a diminished interest in his usual daily activities.        The patient denies that he feels like life is not worth living, denies that he wishes that he were dead, and denies that he has thought about ending his life.        Preventive Screening-Counseling & Management  Alcohol-Tobacco     Alcohol drinks/day: 0     Smoking Status: current     Smoking Cessation Counseling: yes     Smoke Cessation Stage: precontemplative     Packs/Day: 0.5     Year Started: AT THE AGE OF 15     Passive Smoke Exposure: yes  Caffeine-Diet-Exercise     Caffeine use/day: no     Does Patient Exercise: yes     Type of exercise: walking     Exercise (avg: min/session): >60     Times/week: 5  Hep-HIV-STD-Contraception     HIV Risk: no  Safety-Violence-Falls     Seat Belt Use: yes      Sexual History:  n/a.        Drug Use:  former.        Blood Transfusions:  no.        Travel History:  no.     Updated Prior Medication List: ATRIPLA 600-200-300 MG TABS (EFAVIRENZ-EMTRICITAB-TENOFOVIR) Take 1 tablet by mouth at bedtime ENSURE  LIQD (NUTRITIONAL SUPPLEMENTS) Drink one can twice a day CELEXA 40 MG TABS (CITALOPRAM HYDROBROMIDE) Take 1 tablet by mouth once a day HYDROCHLOROTHIAZIDE 25 MG TABS (HYDROCHLOROTHIAZIDE) Take 1 tablet by mouth once a day  Current Allergies (reviewed today): ! IBUPROFEN Past History:  Past Medical History: Last updated: 10/22/2006 HIV disease  Review of Systems  The patient denies anorexia, fever, and weight loss.    Vital Signs:  Patient profile:   40 year old male Height:      65 inches (165.10 cm) Weight:       212.8 pounds (96.73 kg) BMI:     35.54 Temp:     97.6 degrees F (36.44 degrees C) oral Pulse rate:   80 / minute BP sitting:   141 / 92  (left arm)  Vitals Entered By: Wendall Mola CMA Duncan Dull) (November 09, 2009 9:05 AM) CC: follow-up visit and lab work, complaining of right wrist pain x 2 days Is Patient Diabetic? No Pain Assessment Patient in pain? yes     Location: right wrist Intensity: 5 Type: sharp Onset of pain  Constant Nutritional Status BMI of > 30 = obese Nutritional Status Detail appetite "so-so"  Does patient need assistance? Functional Status Self care Ambulation Normal Comments no missed doses of meds per patient   Physical Exam  General:  alert, well-developed, well-nourished, and well-hydrated.   Head:  normocephalic and atraumatic.   Lungs:  normal breath sounds.   Msk:  no swelling or point tenderness over right wrist FROM        Medication Adherence: 11/09/2009   Adherence to medications reviewed with patient. Counseling to provide adequate adherence provided  Impression & Recommendations:  Problem # 1:  HIV DISEASE (ICD-042) Wil obtain labs today.  He will call for results. he is to continue his Atripla and f/u in 3 months. Diagnostics Reviewed:  HIV: REACTIVE (10/03/2006)   HIV-Western blot: POSITIVE (10/03/2006)   CD4: 520 (06/10/2009)   WBC: 5.0 (06/09/2009)   Hgb: 14.0 (06/09/2009)   HCT: 41.7 (06/09/2009)   Platelets: 202 (06/09/2009) HIV-1 RNA: 190 (06/09/2009)   HBSAg: NEG (10/03/2006)  Problem # 2:  WRIST PAIN, RIGHT (ICD-719.43) trial of NSAIDs if no improvement pt to call.  Other Orders: T-RPR (Syphilis) [phone]) T-Lipid Profile [phone]) Est. Patient Level III (29562) Future Orders: T-CD4SP ([facility]) (CD4SP) ... 02/07/2010 T-HIV Viral Load [phone]) ... 02/07/2010 T-Comprehensive Metabolic Panel [phone]) ... 02/07/2010 T-CBC w/Diff ([id number]) ...  02/07/2010  Patient Instructions: 1)  Please schedule a follow-up appointment in 3 months, 2 weeks after labs.

## 2010-08-29 NOTE — Progress Notes (Signed)
Summary: ncadap med arrived for Sept--unable to reach pt  Phone Note Refill Request      Prescriptions: ATRIPLA 600-200-300 MG TABS (EFAVIRENZ-EMTRICITAB-TENOFOVIR) Take 1 tablet by mouth at bedtime  #30 x 0   Entered by:   Paulo Fruit  BS,CPht II,MPH   Authorized by:   Yisroel Ramming MD   Signed by:   Paulo Fruit  BS,CPht II,MPH on 04/07/2010   Method used:   Samples Given   RxID:   [id number]  Patient Assist Medication Verification: Medication name: Atripla RX # [id number] Tech approval:MLD  Tried to contact patient at phone number we have on file.  I could not leave a message at the time call was placed because patient has his phone set to not accept incoming call at this time.  No vmPaulo Fruit  BS,CPht II,MPH  April 07, 2010 1:32 PM

## 2010-08-29 NOTE — Progress Notes (Signed)
Summary: ncadap med arrived for Sept-Unable to reach patient  Phone Note Refill Request      Prescriptions: ATRIPLA 600-200-300 MG TABS (EFAVIRENZ-EMTRICITAB-TENOFOVIR) Take 1 tablet by mouth at bedtime  #30 x 0   Entered by:   Paulo Fruit  BS,CPht II,MPH   Authorized by:   Yisroel Ramming MD   Signed by:   Paulo Fruit  BS,CPht II,MPH on 04/28/2010   Method used:   Samples Given   RxID:   [id number]  Patient Assist Medication Verification: Medication name: [name] # [id number] Tech approval:mld Tried to contact patient. Unable to reach him.  At the time call placed; patient's phone was set to not accept incoming calls therefore I could not leave a message to call the office. Paulo Fruit  BS,CPht II,MPH  April 28, 2010 3:21 PM

## 2010-08-29 NOTE — Assessment & Plan Note (Signed)
Summary: TB/VS    Medical History Prior Medications: ATRIPLA 600-200-300 MG TABS (EFAVIRENZ-EMTRICITAB-TENOFOVIR) Take 1 tablet by mouth at bedtime PROCHLORPERAZINE MALEATE 5 MG TABS (PROCHLORPERAZINE MALEATE) Take 1-2 tabs by mouth every 8 hours. ULTRAM 50 MG TABS (TRAMADOL HCL) Take 1 tab by mouth every 8 hours as needed for pain. Current Allergies: ! IBUPROFEN Immunizations Administered:  PPD Skin Test:    Vaccine Type: PPD    Site: right forearm    Mfr: Sanofi Pasteur    Dose: 0.1 ml    Route: ID    Given by: Wendall Mola CMA ( AAMA)    Exp. Date: 12/02/2011    Lot #: [id number]  Orders Added: 1)  TB Skin Test [86580] 2)  Admin 1st Vaccine [90471] Prescriptions: DOXYCYCLINE HYCLATE 100 MG CAPS (DOXYCYCLINE HYCLATE) Take 1 capsule by mouth two times a day  #14 x 0   Entered and Authorized by:   Yisroel Ramming MD   Signed by:   Yisroel Ramming MD on 01/18/2010   Method used:   Print then Give to Patient   RxID:   [id number]   Appended Document: TB Skin test reading     Clinical Lists Changes  Observations: Added new observation of TB PPDRESULT: negative (01/24/2010 11:50) Added new observation of PPD RESULT: < 5mm (01/24/2010 11:50) Added new observation of TB-PPD RDDTE: 01/20/2010 (01/24/2010 11:50)       PPD Results    Date of reading: 01/20/2010    Results: < 5mm    Interpretation: negative

## 2010-08-29 NOTE — Assessment & Plan Note (Signed)
Summary: F/U/VS   CC:  follow-up visit, lab results, c/o boil right buttocks, and frequent headache.  History of Present Illness: Pt here for lab results.  He gets recurrent boils on his buttock.  Currently he does not have one. No missed doses of his Atripla.  Preventive Screening-Counseling & Management  Alcohol-Tobacco     Alcohol drinks/day: 0     Smoking Status: current     Smoking Cessation Counseling: yes     Smoke Cessation Stage: precontemplative     Packs/Day: 0.5     Year Started: AT THE AGE OF 40     Passive Smoke Exposure: yes  Caffeine-Diet-Exercise     Caffeine use/day: soda 2 per day     Does Patient Exercise: yes     Type of exercise: walking     Exercise (avg: min/session): >60     Times/week: 5  Hep-HIV-STD-Contraception     HIV Risk: no  Safety-Violence-Falls     Seat Belt Use: yes      Sexual History:  n/a.        Drug Use:  former.        Blood Transfusions:  no.        Travel History:  no.    Comments: pt. declined condoms   Updated Prior Medication List: ATRIPLA 600-200-300 MG TABS (EFAVIRENZ-EMTRICITAB-TENOFOVIR) Take 1 tablet by mouth at bedtime DOXYCYCLINE HYCLATE 100 MG CAPS (DOXYCYCLINE HYCLATE) Take 1 capsule by mouth two times a day  Current Allergies (reviewed today): ! IBUPROFEN Past History:  Past Medical History: Last updated: 10/22/2006 HIV disease  Review of Systems  The patient denies anorexia, fever, and weight loss.    Vital Signs:  Patient profile:   40 year old male Height:      65 inches (165.10 cm) Weight:      217.8 pounds (99 kg) BMI:     36.37 Temp:     98.4 degrees F (36.89 degrees C) oral Pulse rate:   92 / minute BP sitting:   134 / 87  (right arm)  Vitals Entered By: Wendall Mola CMA Duncan Dull) (February 20, 2010 9:53 AM) CC: follow-up visit, lab results, c/o boil right buttocks, frequent headache Is Patient Diabetic? No Pain Assessment Patient in pain? no      Nutritional Status  BMI of 25 - 29 = overweight Nutritional Status Detail appetite "so-so"  Have you ever been in a relationship where you felt threatened, hurt or afraid?No   Does patient need assistance? Functional Status Self care Ambulation Normal Comments no missed doses of Atripla per patient   Physical Exam  General:  alert, well-developed, well-nourished, and well-hydrated.   Head:  normocephalic and atraumatic.   Mouth:  pharynx pink and moist.   Lungs:  normal breath sounds.   Rectal:  no current boils present    Impression & Recommendations:  Problem # 1:  HIV DISEASE (ICD-042) Pt.s most recent CD4ct was 570 and VL <48 .  Pt instructed to continue the current antiretroviral regimen.  Pt encouraged to take medication regularly and not miss doses.  Pt will f/u in 3 months for repeat blood work and will see me 2 weeks later.  Diagnostics Reviewed:  HIV: HIV positive - not AIDS (11/09/2009)   HIV-Western blot: POSITIVE (10/03/2006)   CD4: 570 (02/07/2010)   WBC: 7.5 (02/06/2010)   Hgb: 14.5 (02/06/2010)   HCT: 42.7 (02/06/2010)   Platelets: 182 (02/06/2010) HIV-1 RNA: <48 copies/mL (02/06/2010)   HBSAg: NEG (  10/03/2006)  Medications Added to Medication List This Visit: 1)  Doxycycline Hyclate 100 Mg Caps (Doxycycline hyclate) .... Take 1 capsule by mouth two times a day  Other Orders: Est. Patient Level III (16109) Future Orders: T-CD4SP ([facility]) (CD4SP) ... 05/21/2010 T-HIV Viral Load [phone]) ... 05/21/2010 T-Comprehensive Metabolic Panel [phone]) ... 05/21/2010 T-CBC w/Diff ([id number]) ... 05/21/2010  Patient Instructions: 1)  Please schedule a follow-up appointment in 3 months, 2 weeks after labs.  Prescriptions: DOXYCYCLINE HYCLATE 100 MG CAPS (DOXYCYCLINE HYCLATE) Take 1 capsule by mouth two times a day  #14 x 0   Entered and Authorized by:   Yisroel Ramming MD   Signed by:   Yisroel Ramming MD on 02/20/2010   Method used:   Print then Give to Patient   RxID:    [id number]

## 2010-08-29 NOTE — Progress Notes (Signed)
Summary: refill/mld  Phone Note Call from Patient   Caller: Patient Reason for Call: Refill Medication Summary of Call: Patient called to inform me that he does not have anymore refills on his medications.  He is aking that we call in refills to the [facility] pharmacy for him. Initial call taken by: Paulo Fruit  BS,CPht II,MPH,  February 13, 2010 11:54 AM    Prescriptions: ATRIPLA 600-200-300 MG TABS (EFAVIRENZ-EMTRICITAB-TENOFOVIR) Take 1 tablet by mouth at bedtime  #30 x 6   Entered by:   Paulo Fruit  BS,CPht II,MPH   Authorized by:   Yisroel Ramming MD   Signed by:   Paulo Fruit  BS,CPht II,MPH on 02/13/2010   Method used:   Electronically to        [facility] [phone]* (retail)       [address]       [place], [place]  [address]       Ph: [phone]       Fax:    RxID:   [id number]  Paulo Fruit  BS,CPht II,MPH  February 13, 2010 11:55 AM

## 2010-08-29 NOTE — Letter (Signed)
Summary: TB Skin Test  All     ,     Phone:   Fax:           TB Skin Test    Drew Cuevas    Date TB Test Placed:  ________________  L or R forearm  TB Test Placed by:  ___________________  Lot #:  __________________        Expiration Date: _____________  Date TB Test Read:  ____________________    Result ___________MM  TB Test Read by:  _______________

## 2010-08-29 NOTE — Progress Notes (Signed)
Summary: Celexa arrived via [facility] 12-26-06/mld  Phone Note Refill Request      Prescriptions: CELEXA 20 MG TABS (CITALOPRAM HYDROBROMIDE) Take 1 tablet by mouth once a day  #17 x 0   Entered by:   Paulo Fruit   Authorized by:   Yisroel Ramming MD   Signed by:   Paulo Fruit on 12/27/2006   Method used:   Samples Given   RxID:   [id number]  Patient Assist Medication Verification: Medication name: Citalopram Hydrobromide 20mg  RX # [id number] Tech approval:MLD  [facility] mail order pharmacy sent enough tablets for patient to get through until his next order is ready to be shipped.  Then, he will receive a 30-day supply.  Call placed to patient with message that assistance medications are ready for pick-up. ...................................................................Paulo Fruit  Dec 27, 2006 8:11 AM

## 2010-08-29 NOTE — Progress Notes (Signed)
Summary: ncadap med arrived for Aug--Unable to reach pt  Phone Note Refill Request      Prescriptions: ATRIPLA 600-200-300 MG TABS (EFAVIRENZ-EMTRICITAB-TENOFOVIR) Take 1 tablet by mouth at bedtime  #30 x 0   Entered by:   Paulo Fruit  BS,CPht II,MPH   Authorized by:   Yisroel Ramming MD   Signed by:   Paulo Fruit  BS,CPht II,MPH on 03/16/2010   Method used:   Samples Given   RxID:   [id number]  Patient Assist Medication Verification: Medication name: Atripla RX #  [id number] Tech approval:MLD  Tried to contact patient. Phone set to not accept phone calls or messages at the time call was placed. Paulo Fruit  BS,CPht II,MPH  March 16, 2010 4:04 PM

## 2010-08-29 NOTE — Progress Notes (Signed)
Summary: NCADAP/pt assist med arrived for May  Phone Note Refill Request      Prescriptions: ATRIPLA 600-200-300 MG TABS (EFAVIRENZ-EMTRICITAB-TENOFOVIR) Take 1 tablet by mouth at bedtime  #30 x 0   Entered by:   Paulo Fruit  BS,CPht II,MPH   Authorized by:   Yisroel Ramming MD   Signed by:   Paulo Fruit  BS,CPht II,MPH on 12/09/2009   Method used:   Samples Given   RxID:   [id number]   Patient Assist Medication Verification: Medication: Atripla Lot# [id number] Exp Date:10 2013 Tech approval:MLD Tried to contact patient.  Unable to leave a message; his phone is set to not accept calls at the time call was placed. Paulo Fruit  BS,CPht II,MPH  Dec 09, 2009 10:53 AM

## 2010-08-29 NOTE — Progress Notes (Signed)
Summary: NCADAP/pt assist med arrived for Mar  Phone Note Refill Request      Prescriptions: ATRIPLA 600-200-300 MG TABS (EFAVIRENZ-EMTRICITAB-TENOFOVIR) Take 1 tablet by mouth at bedtime  #30 x 0   Entered by:   Paulo Fruit  BS,CPht II,MPH   Authorized by:   Yisroel Ramming MD   Signed by:   Paulo Fruit  BS,CPht II,MPH on 10/10/2009   Method used:   Samples Given   RxID:   [id number]   Patient Assist Medication Verification: Medication: Atripla UUV#[id number] Exp Date:02 2013 Tech approval:MLD  **Patient already aware that February's order is ready to be picked up. Paulo Fruit  BS,CPht II,MPH  October 10, 2009 2:40 PM

## 2010-08-29 NOTE — Progress Notes (Signed)
Summary: [facility]AP rx arrived  Phone Note Outgoing Call   Call placed by: Jennet Maduro RN,  June 02, 2010 3:14 PM Call placed to: Patient Action Taken: Phone Call Completed, Assistance medications ready for pick up Summary of Call: [facility] rx arrived, Atripla.  Pt. notified.  Jennet Maduro RN  June 02, 2010 3:15 PM

## 2010-08-29 NOTE — Miscellaneous (Signed)
Summary: Orders Update  Clinical Lists Changes  Orders: Added new Test order of T-CD4SP ([facility]) (CD4SP) - Signed 

## 2010-08-29 NOTE — Miscellaneous (Signed)
Summary: Hospital consultation  INTERNAL MEDICINE CONSULTATION NOTE  PCP: Dr. Philipp Deputy  CC:  HA  HPI:  Pt is a 40 yo male w/ past med hx of well controlled HIV w/ nl CD4 count and undetectable viral load who presented to the ER c/o of an anterior HA that started gradually yesterday.  It is not the worst HA of his life.  He has associated nausea, photophobia, and neck stiffness.  He said he has had a HA just like this in the past and was tx'd in the ER w/ meds and it went away.  He denies fevers, vomiting, tick bites, visual changes, hearing changes, recent URI symptoms, SOB, chest pain, abd'l pain, and sick contacts. His pain was 8/10 but after the compazine is 4-5/10.   ALLERGIES: IBUPROFEN  PAST MEDICAL HISTORY: HIV disease HTN Rectal abscess Tension HA  MEDICATIONS: ATRIPLA 600-200-300 MG TABS (EFAVIRENZ-EMTRICITAB-TENOFOVIR) Take 1 tablet by mouth at bedtime  SOCIAL HISTORY: Current Smoker, denies alcohol use, occasionally smokes pot, denies cocaine.  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   FAMILY HISTORY Family History Hypertension Family History of Prostate CA 1st degree relative <50 Grandmother had a stroke No hx of aneurysms  ROS:  per HPI.  VITALS: T: 98.3 P: 90 BP: 158/99 R: 18 O2SAT:  98% ON: RA PHYSICAL EXAM: General:  alert, oriented, asleep but arouses easily, no distress. Head:  normocephalic and atraumatic.   Eyes:  PERRL, EOMI, no papilledema.    Mouth:  pharynx pink and moist, no erythema, and no exudates.   Neck:  supple, full ROM, no thyromegaly, no JVD, Kernig and Brudenski is negative.   Lungs:  normal respiratory effort, no accessory muscle use, normal breath sounds, no crackles, and no wheezes. Heart:  normal rate, regular rhythm, no murmur, no gallop, and no rub.   Abdomen:  soft, non-tender, normal bowel sounds, no distention, no guarding, no rebound tenderness, no hepatomegaly, and no splenomegaly.   Msk:  no joint  swelling, no joint warmth, and no redness over joints.   Pulses:  2+ DP/PT pulses bilaterally Extremities:  No cyanosis, clubbing, edema Neurologic:  alert & oriented X3, cranial nerves II-XII intact, strength normal in all extremities, sensation intact to light touch, Kernig/Brud normal. Skin: no rashes, multiple tatoos.   Psych:  Oriented X3, memory intact for recent and remote, normally interactive, good eye contact,   LABS:  WBC                                      7.2               4.0-10.5         K/uL  RBC                                      4.28              4.22-5.81        MIL/uL  Hemoglobin (HGB)                         14.6              13.0-17.0        g/dL  Hematocrit (HCT)  41.1              39.0-52.0        %  MCV                                      96.0              78.0-100.0       fL  MCHC                                     35.5              30.0-36.0        g/dL  RDW                                      13.5              11.5-15.5        %  Platelet Count (PLT)                     152               150-400          K/uL  Neutrophils, %                           56                43-77            %  Lymphocytes, %                           31                12-46            %  Monocytes, %                             7                 3-12             %  Eosinophils, %                           5                 0-5              %  Basophils, %                             1                 0-1              %  Neutrophils, Absolute                    4.1               1.7-7.7  K/uL  Lymphocytes, Absolute                    2.2               0.7-4.0          K/uL  Monocytes, Absolute                      0.5               0.1-1.0          K/uL  Eosinophils, Absolute                    0.3               0.0-0.7          K/uL  Basophils, Absolute                      0.0               0.0-0.1          K/uL  TCO2                                      28                0-100            mmol/L  Ionized Calcium                          1.11       l      1.12-1.32        mmol/L  Hemoglobin (HGB)                         15.3              13.0-17.0        g/dL  Hematocrit (HCT)                         45.0              39.0-52.0        %  Sodium (NA)                              142               135-145          mEq/L  Potassium (K)                            3.4        l      3.5-5.1          mEq/L  Chloride                                 106               96-112           mEq/L  Glucose  83                70-99            mg/dL  BUN                                      8                 6-23             mg/dL  Creatinine                               1.2               0.4-1.5          mg/dL  ASSESSMENT AND PLAN:  Pt is a 40 yo male w/: 1. HA: Likely migraine given the hx of previous symptoms, nausea, and photophobia.  He is immunocompetent, making things like crypto, toxo, etc less likely.  However, do consider meningitis on the diff'l given hx of stiff neck and photophobia but he has no focal neurological exam findings, normal white count w/ diff, no fever, and no meningeal signs on exam, this would be extremely unlikely.  Also HSV is on the diff'l but he is not encephalopathic and no prior hx of herpes.  No hx or risk factors for tick illness.  He has declined LP.  Given that he is feeling better w/ conservative tx, think it is ok to send him home w/ close OP f/u.  Dr. Sampson Goon and I have discussed the plan w/ him.  He will return if he develops fevers, rashes, etc.  I have scheduled him an appointment with the [facility] on June 6th at 3 pm with Dr. Onalee Hua for ER f/u but he knows to call/return if he does not get better.   ATTENDING: I performed and/or observed a history and physical examination of the patient.  I discussed the case with the residents as noted and reviewed the residents' notes.  I agree with the  findings and plan--please refer to the attending physician note for more details.  Signature________________________________  Printed Name_____________________________     Appended Document: Hospital consultation Forgot to add: CT scan of the head neg.  Also, pt arranged for transportation home from ER b/c told not to drive b/c of receiving compazine and vicodin.  Also instructed not to drive after given prescription for compazine and ultram.   Clinical Lists Changes  Medications: Added new medication of PROCHLORPERAZINE MALEATE 5 MG TABS (PROCHLORPERAZINE MALEATE) Take 1-2 tabs by mouth every 8 hours. - Signed Added new medication of ULTRAM 50 MG TABS (TRAMADOL HCL) Take 1 tab by mouth every 8 hours as needed for pain. - Signed Removed medication of CELEXA 40 MG TABS (CITALOPRAM HYDROBROMIDE) Take 1 tablet by mouth once a day Removed medication of HYDROCHLOROTHIAZIDE 25 MG TABS (HYDROCHLOROTHIAZIDE) Take 1 tablet by mouth once a day Removed medication of ENSURE  LIQD (NUTRITIONAL SUPPLEMENTS) Drink one can twice a day Rx of PROCHLORPERAZINE MALEATE 5 MG TABS (PROCHLORPERAZINE MALEATE) Take 1-2 tabs by mouth every 8 hours.;  #28 x 0;  Signed;  Entered by: Joaquin Courts  MD;  Authorized by: Joaquin Courts  MD;  Method used: Historical Rx of ULTRAM 50 MG TABS (TRAMADOL HCL) Take 1 tab by mouth every  8 hours as needed for pain.;  #28 x 0;  Signed;  Entered by: Joaquin Courts  MD;  Authorized by: Joaquin Courts  MD;  Method used: Historical    Prescriptions: ULTRAM 50 MG TABS (TRAMADOL HCL) Take 1 tab by mouth every 8 hours as needed for pain.  #28 x 0   Entered and Authorized by:   Joaquin Courts  MD   Signed by:   Joaquin Courts  MD on 12/23/2009   Method used:   Historical   RxID:   [id number] PROCHLORPERAZINE MALEATE 5 MG TABS (PROCHLORPERAZINE MALEATE) Take 1-2 tabs by mouth every 8 hours.  #28 x 0   Entered and Authorized by:   Joaquin Courts  MD   Signed by:   Joaquin Courts  MD on 12/23/2009   Method used:   Historical   RxID:   [id number]

## 2010-08-29 NOTE — Progress Notes (Signed)
Summary: adap medications arrived  Phone Note Outgoing Call   Call placed by: Starleen Arms CMA,  May 19, 2010 4:09 PM Call placed to: Patient Summary of Call: medications arrived from [facility]. Patient aware Initial call taken by: Starleen Arms CMA,  May 19, 2010 4:11 PM

## 2010-08-29 NOTE — Progress Notes (Signed)
Summary: [facility]/pt assist med arrived for Feb  Phone Note Refill Request      Prescriptions: ATRIPLA 600-200-300 MG TABS (EFAVIRENZ-EMTRICITAB-TENOFOVIR) Take 1 tablet by mouth at bedtime  #30 x 0   Entered by:   Paulo Fruit  BS,CPht II,MPH   Authorized by:   Yisroel Ramming MD   Signed by:   Paulo Fruit  BS,CPht II,MPH on 09/15/2009   Method used:   Samples Given   RxID:   [id number]   Patient Assist Medication Verification: Medication: Atripla UXL#[id number] Exp Date:02 2013 Tech approval:MLD Call placed to patient with message that assistance medications are ready for pick-up. Left message on VM.  His future orders after this month will go to his home per his request. Paulo Fruit  BS,CPht II,MPH  September 15, 2009 11:51 AM

## 2010-08-29 NOTE — Progress Notes (Signed)
Summary: [facility]/pt assist med arrived for Jul  Phone Note Refill Request      Prescriptions: ATRIPLA 600-200-300 MG TABS (EFAVIRENZ-EMTRICITAB-TENOFOVIR) Take 1 tablet by mouth at bedtime  #30 x 0   Entered by:   Paulo Fruit  BS,CPht II,MPH   Authorized by:   Yisroel Ramming MD   Signed by:   Paulo Fruit  BS,CPht II,MPH on 02/17/2010   Method used:   Samples Given   RxID:   [id number]  Patient Assist Medication Verification: Medication name: Atripla RX # [id number] Tech approval:MLD Tried to  contact patient.  Could not get through on cell phone. Tried the house number and at the time call was placed it was not accepting incoming calls. Paulo Fruit  BS,CPht II,MPH  February 17, 2010 10:54 AM

## 2010-08-29 NOTE — Progress Notes (Signed)
Summary: leg pain  Phone Note Call from Patient   Caller: Patient Summary of Call: Pt. called c/o leg pain x 1 year, he has a lab appt. tomorrow and appt with Dr.Vollmer for 05/24/10.  Advised pt. to discuss with Dr. Philipp Deputy at office visit. Initial call taken by: Wendall Mola CMA Duncan Dull),  April 27, 2010 4:51 PM

## 2010-08-29 NOTE — Progress Notes (Signed)
Summary: [facility]/pt assist med arrived for Apr  Phone Note Refill Request      Prescriptions: ATRIPLA 600-200-300 MG TABS (EFAVIRENZ-EMTRICITAB-TENOFOVIR) Take 1 tablet by mouth at bedtime  #30 x 0   Entered by:   Paulo Fruit  BS,CPht II,MPH   Authorized by:   Yisroel Ramming MD   Signed by:   Paulo Fruit  BS,CPht II,MPH on 11/14/2009   Method used:   Samples Given   RxID:   [id number]   Patient Assist Medication Verification: Medication: Atripla Lot# [id number] Exp Date:08 2013 Tech approval:MLD Call placed to patient with message that assistance medications are ready for pick-up. Left message for patient to contact [facility] @ [phone] Paulo Fruit  BS,CPht II,MPH  November 14, 2009 10:20 AM

## 2010-08-31 NOTE — Progress Notes (Signed)
  Prescriptions: ATRIPLA 600-200-300 MG TABS (EFAVIRENZ-EMTRICITAB-TENOFOVIR) Take 1 tablet by mouth at bedtime  #30 x 0   Entered and Authorized by:   Starleen Arms CMA   Signed by:   Starleen Arms CMA on 08/25/2010   Method used:   Samples Given   RxID:   [id number]  Medications arrived and patient aware Starleen Arms CMA  August 25, 2010 4:05 PM

## 2010-08-31 NOTE — Miscellaneous (Signed)
Summary: lab orders  Clinical Lists Changes  Orders: Added new Test order of T-Chlamydia  Probe, urine [id number]) - Signed Added new Test order of T-CBC w/Diff [phone]) - Signed Added new Test order of T-CD4SP [facility]) (CD4SP) - Signed Added new Test order of T-GC Probe, urine [id number]) - Signed Added new Test order of T-Comprehensive Metabolic Panel [id number]) - Signed Added new Test order of T-HIV Viral Load [phone]) - Signed Added new Test order of T-RPR (Syphilis) ([id number]) - Signed Added new Test order of T-Urine Culture (Spectrum Order) [phone]) - Signed  Appended Document: Orders Update    Clinical Lists Changes  Orders: Added new Test order of T-Urinalysis ([id number]) - Signed

## 2010-10-12 LAB — CBC
HCT: 39.1 % (ref 39.0–52.0)
MCHC: 34.5 g/dL (ref 30.0–36.0)
Platelets: 172 10*3/uL (ref 150–400)
RDW: 12.8 % (ref 11.5–15.5)

## 2010-10-12 LAB — COMPREHENSIVE METABOLIC PANEL
BUN: 7 mg/dL (ref 6–23)
Calcium: 8.3 mg/dL — ABNORMAL LOW (ref 8.4–10.5)
Glucose, Bld: 84 mg/dL (ref 70–99)
Sodium: 140 mEq/L (ref 135–145)
Total Protein: 6.7 g/dL (ref 6.0–8.3)

## 2010-10-12 LAB — URINALYSIS, ROUTINE W REFLEX MICROSCOPIC
Glucose, UA: NEGATIVE mg/dL
Specific Gravity, Urine: 1.003 — ABNORMAL LOW (ref 1.005–1.030)
pH: 7 (ref 5.0–8.0)

## 2010-10-12 LAB — DIFFERENTIAL
Lymphs Abs: 2.5 10*3/uL (ref 0.7–4.0)
Monocytes Relative: 10 % (ref 3–12)
Neutro Abs: 2.3 10*3/uL (ref 1.7–7.7)
Neutrophils Relative %: 39 % — ABNORMAL LOW (ref 43–77)

## 2010-10-15 LAB — COMPREHENSIVE METABOLIC PANEL
ALT: 51 U/L (ref 0–53)
AST: 39 U/L — ABNORMAL HIGH (ref 0–37)
CO2: 23 mEq/L (ref 19–32)
Calcium: 7.9 mg/dL — ABNORMAL LOW (ref 8.4–10.5)
Chloride: 106 mEq/L (ref 96–112)
GFR calc Af Amer: >60 mL/min (ref >60)
GFR calc non Af Amer: >60 mL/min (ref >60)
Sodium: 135 mEq/L (ref 135–145)

## 2010-10-15 LAB — URINALYSIS, ROUTINE W REFLEX MICROSCOPIC
Bilirubin Urine: NEGATIVE
Glucose, UA: NEGATIVE mg/dL
Hgb urine dipstick: NEGATIVE
Ketones, ur: NEGATIVE mg/dL
Nitrite: NEGATIVE
pH: 8 (ref 5.0–8.0)

## 2010-10-15 LAB — CBC
MCHC: 35.2 g/dL (ref 30.0–36.0)
RBC: 4.03 MIL/uL — ABNORMAL LOW (ref 4.22–5.81)
WBC: 6.5 10*3/uL (ref 4.0–10.5)

## 2010-10-15 LAB — DIFFERENTIAL
Eosinophils Absolute: 0.1 10*3/uL (ref 0.0–0.7)
Eosinophils Relative: 2 % (ref 0–5)
Lymphs Abs: 1 10*3/uL (ref 0.7–4.0)
Monocytes Absolute: 0.5 10*3/uL (ref 0.1–1.0)

## 2010-10-15 LAB — T-HELPER CELL (CD4) - (RCID CLINIC ONLY): CD4 T Cell Abs: 570 uL (ref 400–2700)

## 2010-10-15 LAB — LIPASE, BLOOD: Lipase: 46 U/L (ref 11–59)

## 2010-10-16 LAB — CBC
HCT: 41.1 % (ref 39.0–52.0)
Hemoglobin: 14.6 g/dL (ref 13.0–17.0)
MCHC: 35.5 g/dL (ref 30.0–36.0)
MCV: 96 fL (ref 78.0–100.0)
RDW: 13.5 % (ref 11.5–15.5)

## 2010-10-16 LAB — DIFFERENTIAL
Basophils Absolute: 0 10*3/uL (ref 0.0–0.1)
Basophils Relative: 1 % (ref 0–1)
Eosinophils Relative: 5 % (ref 0–5)
Monocytes Absolute: 0.5 10*3/uL (ref 0.1–1.0)
Monocytes Relative: 7 % (ref 3–12)

## 2010-10-16 LAB — CULTURE, BLOOD (ROUTINE X 2): Culture: NO GROWTH

## 2010-10-16 LAB — POCT I-STAT, CHEM 8
Calcium, Ion: 1.11 mmol/L — ABNORMAL LOW (ref 1.12–1.32)
Glucose, Bld: 83 mg/dL (ref 70–99)
HCT: 45 % (ref 39.0–52.0)
TCO2: 28 mmol/L (ref 0–100)

## 2010-10-18 LAB — T-HELPER CELL (CD4) - (RCID CLINIC ONLY): CD4 T Cell Abs: 530 uL (ref 400–2700)

## 2010-10-31 ENCOUNTER — Inpatient Hospital Stay (INDEPENDENT_AMBULATORY_CARE_PROVIDER_SITE_OTHER)
Admission: RE | Admit: 2010-10-31 | Discharge: 2010-10-31 | Disposition: A | Discharge status: Home / Self Care | Payer: Self-pay | Source: Ambulatory Visit | Attending: Family Medicine | Admitting: Family Medicine

## 2010-10-31 DIAGNOSIS — L989 Disorder of the skin and subcutaneous tissue, unspecified: Secondary | ICD-10-CM

## 2010-11-01 LAB — T-HELPER CELL (CD4) - (RCID CLINIC ONLY)
CD4 % Helper T Cell: 24 % — ABNORMAL LOW (ref 33–55)
CD4 T Cell Abs: 520 uL (ref 400–2700)

## 2010-11-06 LAB — CULTURE, ROUTINE-ABSCESS

## 2010-11-09 ENCOUNTER — Other Ambulatory Visit (INDEPENDENT_AMBULATORY_CARE_PROVIDER_SITE_OTHER): Payer: Self-pay

## 2010-11-09 DIAGNOSIS — Z79899 Other long term (current) drug therapy: Secondary | ICD-10-CM

## 2010-11-09 DIAGNOSIS — B20 Human immunodeficiency virus [HIV] disease: Secondary | ICD-10-CM

## 2010-11-10 LAB — COMPLETE METABOLIC PANEL WITH GFR
ALT: 39 U/L (ref 0–53)
AST: 27 U/L (ref 0–37)
Alkaline Phosphatase: 80 U/L (ref 39–117)
BUN: 13 mg/dL (ref 6–23)
Calcium: 9.3 mg/dL (ref 8.4–10.5)
Chloride: 108 mEq/L (ref 96–112)
Creat: 1.17 mg/dL (ref 0.40–1.50)
Total Bilirubin: 0.3 mg/dL (ref 0.3–1.2)

## 2010-11-10 LAB — LIPID PANEL
Cholesterol: 197 mg/dL (ref 0–200)
HDL: 40 mg/dL (ref >39)
Total CHOL/HDL Ratio: 4.9 Ratio

## 2010-11-10 LAB — CBC WITH DIFFERENTIAL/PLATELET
Basophils Absolute: 0.1 10*3/uL (ref 0.0–0.1)
Basophils Relative: 1 % (ref 0–1)
Eosinophils Absolute: 0.3 10*3/uL (ref 0.0–0.7)
Eosinophils Relative: 5 % (ref 0–5)
HCT: 44.3 % (ref 39.0–52.0)
Lymphocytes Relative: 44 % (ref 12–46)
MCHC: 34.8 g/dL (ref 30.0–36.0)
MCV: 94.9 fL (ref 78.0–100.0)
Monocytes Absolute: 0.2 10*3/uL (ref 0.1–1.0)
Platelets: 191 10*3/uL (ref 150–400)
RDW: 13.6 % (ref 11.5–15.5)
WBC: 5.1 10*3/uL (ref 4.0–10.5)

## 2010-11-10 LAB — T-HELPER CELL (CD4) - (RCID CLINIC ONLY): CD4 T Cell Abs: 450 uL (ref 400–2700)

## 2010-11-11 LAB — HIV-1 RNA QUANT-NO REFLEX-BLD
HIV 1 RNA Quant: <20 copies/mL (ref <20)
HIV-1 RNA Quant, Log: <1.3 {Log} (ref <1.30)

## 2010-11-23 ENCOUNTER — Ambulatory Visit: Payer: Self-pay | Admitting: Adult Health

## 2010-11-24 ENCOUNTER — Emergency Department (HOSPITAL_COMMUNITY)
Admission: EM | Admit: 2010-11-24 | Discharge: 2010-11-24 | Disposition: A | Discharge status: Home / Self Care | Payer: Self-pay | Attending: Emergency Medicine | Admitting: Emergency Medicine

## 2010-11-24 ENCOUNTER — Emergency Department (HOSPITAL_COMMUNITY): Payer: Self-pay

## 2010-11-24 DIAGNOSIS — R05 Cough: Secondary | ICD-10-CM | POA: Insufficient documentation

## 2010-11-24 DIAGNOSIS — J40 Bronchitis, not specified as acute or chronic: Secondary | ICD-10-CM | POA: Insufficient documentation

## 2010-11-24 DIAGNOSIS — R079 Chest pain, unspecified: Secondary | ICD-10-CM | POA: Insufficient documentation

## 2010-11-24 DIAGNOSIS — R059 Cough, unspecified: Secondary | ICD-10-CM | POA: Insufficient documentation

## 2010-11-24 DIAGNOSIS — R6883 Chills (without fever): Secondary | ICD-10-CM | POA: Insufficient documentation

## 2010-11-24 DIAGNOSIS — R112 Nausea with vomiting, unspecified: Secondary | ICD-10-CM | POA: Insufficient documentation

## 2010-11-24 DIAGNOSIS — F17200 Nicotine dependence, unspecified, uncomplicated: Secondary | ICD-10-CM | POA: Insufficient documentation

## 2010-11-24 LAB — POCT CARDIAC MARKERS
CKMB, poc: <1 ng/mL — ABNORMAL LOW (ref 1.0–8.0)
Troponin i, poc: <0.05 ng/mL (ref 0.00–0.09)

## 2010-11-24 LAB — DIFFERENTIAL
Eosinophils Absolute: 0 10*3/uL (ref 0.0–0.7)
Eosinophils Relative: 0 % (ref 0–5)
Lymphs Abs: 0.8 10*3/uL (ref 0.7–4.0)
Monocytes Relative: 5 % (ref 3–12)

## 2010-11-24 LAB — CBC
MCH: 33.3 pg (ref 26.0–34.0)
MCHC: 36.1 g/dL — ABNORMAL HIGH (ref 30.0–36.0)
MCV: 92.3 fL (ref 78.0–100.0)
Platelets: 158 10*3/uL (ref 150–400)
RDW: 12.6 % (ref 11.5–15.5)

## 2010-11-24 LAB — POCT I-STAT, CHEM 8
BUN: 14 mg/dL (ref 6–23)
Calcium, Ion: 1.08 mmol/L — ABNORMAL LOW (ref 1.12–1.32)
Chloride: 108 mEq/L (ref 96–112)
Glucose, Bld: 93 mg/dL (ref 70–99)
HCT: 44 % (ref 39.0–52.0)
Potassium: 3.8 mEq/L (ref 3.5–5.1)

## 2010-11-28 ENCOUNTER — Ambulatory Visit: Payer: Self-pay | Admitting: Adult Health

## 2010-11-30 ENCOUNTER — Other Ambulatory Visit: Payer: Self-pay | Admitting: *Deleted

## 2010-11-30 ENCOUNTER — Telehealth: Payer: Self-pay | Admitting: *Deleted

## 2010-11-30 DIAGNOSIS — J069 Acute upper respiratory infection, unspecified: Secondary | ICD-10-CM

## 2010-11-30 MED ORDER — CIPROFLOXACIN HCL 500 MG PO TABS: 500.0000 mg | ORAL_TABLET | Freq: Two times a day (BID) | ORAL | Status: DC

## 2010-11-30 MED ORDER — CIPROFLOXACIN HCL 500 MG PO TABS: 500.0000 mg | ORAL_TABLET | Freq: Two times a day (BID) | ORAL | Status: AC

## 2010-11-30 NOTE — Telephone Encounter (Signed)
[phone] uses [facility] on [address].  Went to ED for bronchitis & was given rx for azithromycin & albuterol inhaler.  Cannot afford these.  Wants something else from the $4 list. Told him I will check with his provider & will call him with the response

## 2010-12-07 ENCOUNTER — Telehealth: Payer: Self-pay | Admitting: *Deleted

## 2010-12-07 NOTE — Telephone Encounter (Signed)
States he did get the new antibiotic last week & is almost finished with it. Has not been able to afford inhaler. Does not feel much better. Offered appt. States he will wait for his appt next week

## 2010-12-08 ENCOUNTER — Ambulatory Visit: Payer: Self-pay | Admitting: Adult Health

## 2010-12-11 ENCOUNTER — Ambulatory Visit: Payer: Self-pay | Admitting: Adult Health

## 2010-12-12 ENCOUNTER — Ambulatory Visit (HOSPITAL_COMMUNITY)
Admission: RE | Admit: 2010-12-12 | Discharge: 2010-12-12 | Disposition: A | Discharge status: Home / Self Care | Payer: Self-pay | Source: Ambulatory Visit | Attending: Adult Health | Admitting: Adult Health

## 2010-12-12 ENCOUNTER — Ambulatory Visit (INDEPENDENT_AMBULATORY_CARE_PROVIDER_SITE_OTHER): Payer: Self-pay | Admitting: Adult Health

## 2010-12-12 ENCOUNTER — Telehealth: Payer: Self-pay | Admitting: *Deleted

## 2010-12-12 ENCOUNTER — Encounter: Payer: Self-pay | Admitting: Adult Health

## 2010-12-12 DIAGNOSIS — M79605 Pain in left leg: Secondary | ICD-10-CM

## 2010-12-12 DIAGNOSIS — M25561 Pain in right knee: Secondary | ICD-10-CM

## 2010-12-12 DIAGNOSIS — B20 Human immunodeficiency virus [HIV] disease: Secondary | ICD-10-CM

## 2010-12-12 DIAGNOSIS — M25669 Stiffness of unspecified knee, not elsewhere classified: Secondary | ICD-10-CM | POA: Insufficient documentation

## 2010-12-12 DIAGNOSIS — Z21 Asymptomatic human immunodeficiency virus [HIV] infection status: Secondary | ICD-10-CM

## 2010-12-12 DIAGNOSIS — M25569 Pain in unspecified knee: Secondary | ICD-10-CM

## 2010-12-12 DIAGNOSIS — F322 Major depressive disorder, single episode, severe without psychotic features: Secondary | ICD-10-CM

## 2010-12-12 DIAGNOSIS — M79609 Pain in unspecified limb: Secondary | ICD-10-CM

## 2010-12-12 DIAGNOSIS — M25469 Effusion, unspecified knee: Secondary | ICD-10-CM | POA: Insufficient documentation

## 2010-12-12 DIAGNOSIS — J45909 Unspecified asthma, uncomplicated: Secondary | ICD-10-CM

## 2010-12-12 MED ORDER — CITALOPRAM HYDROBROMIDE 40 MG PO TABS: 40.0000 mg | ORAL_TABLET | Freq: Every day | ORAL | Status: DC

## 2010-12-12 MED ORDER — ALBUTEROL SULFATE HFA 108 (90 BASE) MCG/ACT IN AERS: 2.0000 | INHALATION_SPRAY | Freq: Four times a day (QID) | RESPIRATORY_TRACT | Status: DC | PRN

## 2010-12-12 NOTE — Telephone Encounter (Signed)
He called to let us know he had his orange [facility] card. Provider entered x ray orders & pt proceeded to [facility] to get them done

## 2010-12-12 NOTE — Progress Notes (Signed)
Subjective:    Patient ID: Drew Cuevas, male    DOB: [birth year], 40 y.o.   MRN: [id number]  HPI Presents to clinic for followup. Remains adherent to his antiretrovirals with good tolerance. Presents with ongoing upper respiratory symptoms. That has been occurring for the past month. Was treated in urgent care with antibiotics and was prescribed an albuterol inhaler for which he did not have money to purchase. Still having sinus drainage and congestion, with slight cough, but no wheezing or shortness of breath. Also complains of pain with FWB to the left lower extremity, from thigh region downward. Also, pain with FWB to right knee while standing. Pain is described as dull ache and "stiffness." He also related to the nurse that he has been having problems with depression and anger in such a way that it has affected his work performance and he has been sent home from work as a result of this. He previously was on citalopram therapy for depression, but has been off of it for some time. When discussing additional issues with him, he declined to discuss the same matters with this provider.   Review of Systems  Constitutional: Negative for fever, chills, diaphoresis, activity change, appetite change, fatigue and unexpected weight change.  HENT: Positive for congestion, rhinorrhea and postnasal drip. Negative for hearing loss, ear pain, nosebleeds, sore throat, facial swelling, sneezing, drooling, mouth sores, trouble swallowing, neck pain, neck stiffness, dental problem, voice change, sinus pressure, tinnitus and ear discharge.   Eyes: Negative for photophobia, pain, discharge, redness, itching and visual disturbance.  Respiratory: Positive for cough. Negative for apnea, choking, chest tightness, shortness of breath, wheezing and stridor.   Cardiovascular: Negative for chest pain, palpitations and leg swelling.  Gastrointestinal: Negative.   Genitourinary: Negative.   Musculoskeletal: Positive for  arthralgias and gait problem. Negative for myalgias, back pain and joint swelling.  Skin: Negative for color change, pallor, rash and wound.  Neurological: Negative for dizziness, tremors, seizures, syncope, facial asymmetry, speech difficulty, weakness, light-headedness, numbness and headaches.  Hematological: Negative for adenopathy. Does not bruise/bleed easily.  Psychiatric/Behavioral: Positive for behavioral problems, decreased concentration and agitation. Negative for suicidal ideas, hallucinations, confusion, sleep disturbance, self-injury and dysphoric mood. The patient is nervous/anxious. The patient is not hyperactive.        All emotional subjective complaints related via interview with the clinic nurse.       Objective:   Physical Exam  Constitutional: He is oriented to person, place, and time. He appears well-developed and well-nourished. No distress.  HENT:  Head: Normocephalic and atraumatic.  Right Ear: External ear normal.  Left Ear: External ear normal.  Mouth/Throat: Oropharynx is clear and moist.       Active postnasal drip noted. Rhinorrhea noted  Eyes: Conjunctivae are normal.  Neck: Normal range of motion. Neck supple.  Cardiovascular: Normal rate, regular rhythm, normal heart sounds and intact distal pulses.   Pulmonary/Chest: Effort normal and breath sounds normal. No respiratory distress. He has no wheezes. He has no rales. He exhibits no tenderness.  Abdominal: Soft. Bowel sounds are normal.  Musculoskeletal:       Demonstrates some enlargement of the right knee joint, but no active edema noted. Good flexion and extension observed. Demonstrates no difficulty in flexion or extension of the left hip, although didn't elicit some discomfort with full weightbearing.  Neurological: He is alert and oriented to person, place, and time. No cranial nerve deficit. He exhibits normal muscle tone. Coordination normal.  Skin: Skin is warm  and dry.  Psychiatric: He has a normal  mood and affect. His behavior is normal. Judgment and thought content normal.          Assessment & Plan:  1. HIV. Labs obtained on 11/24/2010 show a CD4 count of 450 at 23% with a viral load less than 20 copies per mL. Stable on current regimen. Recommend continuing present management with repeat labs in 10 weeks and followup in 3 months. Verbally acknowledged and agreed with plan of care.  2. Hip and Knee Pain. Originally ordered x-rays of the left hip and right knee, but because he currently has a [employer]. Card, we need to make a referral to orthopedics for them to proceed with the radiographic films. We will write an outside, and it for a referral for orthopedic evaluation.  3. Reactive Airway Disease. Seems more associated with a rhinitis. That is causing some reactive lung condition. Although no active wheezing, we will go ahead and prescribe an albuterol MDI HFA for him to use when necessary. Additionally, we recommend that he should begin cetirizine, 10 mg by mouth daily, until otherwise notified. He verbally acknowledged this and agreed with plan of care.  4. Depression and Agitation. Given the history that was provided by the nurse, we offered him to resume his Celexa at 40 mg per day. He was instructed that should that does be initially too much. He may take one half tablet daily for a week and then increase to 1 full tab thereafter. A prescription for 90 day supply was written. According to clinic nurse, he verbally acknowledged this information and agreed with plan of care. We will followup with this to monitor this on his next visit.

## 2010-12-15 NOTE — Op Note (Signed)
   NAME:  Drew Cuevas, Drew Cuevas                            ACCOUNT NO.:  [id number]   MEDICAL RECORD NO.:  [id number]                   PATIENT TYPE:  EMS   LOCATION:  [facility]                                 FACILITY:  [facility]   PHYSICIAN:  Currie Paris, M.D.           DATE OF BIRTH:  July 23, 1971   DATE OF PROCEDURE:  07/25/2002  DATE OF DISCHARGE:                                 OPERATIVE REPORT   PREOPERATIVE DIAGNOSIS:  Posterior perirectal abscess.   POSTOPERATIVE DIAGNOSIS:  Posterior perirectal abscess.   PROCEDURE:  Examination under anesthesia with drainage of perirectal  abscess.   ANESTHESIA:  General.   INDICATIONS FOR PROCEDURE:  This patient is a 40 year old male who has had a  prior perirectal abscess drained about 5 years ago.  He has presented back  to the emergency room for about his 5th time in 5 days with rectal pain.  On  exam today, he had what appeared to be a fluctuant area posteriorly.  After  discussion with the patient, he elected to let us take him to the operating  room for drainage.   DESCRIPTION OF PROCEDURE:  The patient was brought to the operating room and  after satisfactory general anesthesia had been obtained, was placed in the  lithotomy position. The perianal area was prepped. I placed an anoscope in  and immediately saw internal drainage of purulent fluid from what looked  like a left posterior crypt. However, I could not get a probe into that, and  the patient was markedly fluctuant posteriorly.  I opened him over the most  marked area of fluctuance and immediately got severe purulent material,  which actually drained through the muscle and from deep posterior.  I was  able to get a Kocher clamp in here and get this well-drained,and actually  put a suction in.  I then put a Penrose drain in and left that in situ, once  I thought all the bleeding was controlled. I could not make a communication  from there into the rectum, so I elected not to put  any sort of seaton, or  try to open through any muscle.  I got good drainage, and I felt should  improve with this.  The patient tolerated the procedure well. There were no  complications noted.                                                  Currie Paris, M.D.    CJS/MEDQ  D:  07/25/2002  T:  07/25/2002  Job:  [id number]

## 2010-12-15 NOTE — H&P (Signed)
NAME:  Drew Cuevas, Drew Cuevas NO.:  [id number]   MEDICAL RECORD NO.:  [id number]                   PATIENT TYPE:  EMS   LOCATION:  ED                                   FACILITY:  [facility]   PHYSICIAN:  Currie Paris, M.D.           DATE OF BIRTH:  February 13, 1971   DATE OF ADMISSION:  07/25/2002  DATE OF DISCHARGE:                                HISTORY & PHYSICAL   CHIEF COMPLAINT:  Rectal pain.   CLINICAL HISTORY:  The patient is a generally healthy 40 year old male who  has been in and out of the emergency room apparently five times over the  last week with severe rectal pain.  This has been treated as hemorrhoids.  The pain continues to get worse and he is back again today.  He was seen by  the EDT who thought he might have a perirectal abscess and asked for  surgical opinion.   The patient rates his pain as fairly significant and he has really not felt  hungry.  He has not had fevers or chills at home.  He apparently had an  abscess lanced back here about five years ago and that was done in [facility].  He is not really sure what the problem was.  They did not  apparently give him a specific diagnosis.  To the best of his memory, it was  in the same area that is currently hurting which is the posterior rectal  area.   PAST MEDICAL HISTORY:   OPERATIONS:  None other than the prior drainage of the abscess.   MEDICATIONS:  None.   ALLERGIES:  1. He says he is allergic to PENICILLIN.  2. IBUPROFEN.   HABITS:  He does smoke about a pack per day, does drink occasionally.   FAMILY HISTORY:  No significant family illnesses in terms of heart disease,  cancer, etc.   REVIEW OF SYSTEMS:  HEENT:  Negative.  CHEST:  No cough or shortness of  breath.  HEART:  No history of hypertension, heart murmurs, or other cardiac  problems.  ABDOMEN:  Negative except for HPI.  GENITOURINARY:  Negative.  EXTREMITIES:  Negative.   PHYSICAL EXAMINATION:  GENERAL:   The patient is a generally healthy-  appearing 40 year old who is uncomfortable.  He is alert and oriented.  VITAL SIGNS:  Blood pressure 135/83, pulse 93, respirations 26.  HEENT:  Basically normal.  LUNGS:  Show normal respirations and lungs are clear.  HEART:  Regular rhythm.  Heart sounds are normal.  ABDOMEN:  Benign, soft, nontender.  There are no masses, no organomegaly,  normal bowel sounds.  RECTAL:  He is exquisitely tender, directly posteriorly and there is  apparently an abscess which looks like it is starting to bulge through there  and this is the tender area.  I could not really do an internal rectal exam  because of the  patient's pain.  EXTREMITIES:  No cyanosis or edema.   IMPRESSION:  Recurrent posterior perirectal abscess.   PLAN:  I do not think this is going to be trainable in the emergency room  with local and I have talked to him about that so we are going to take him  to the operating room once some preoperative labs are back for review and do  an exam under anesthesia and attempt drainage of this perirectal abscess.  He understands the plans and all questions answered.                                                  Currie Paris, M.D.    CJS/MEDQ  D:  07/25/2002  T:  07/25/2002  Job:  [id number]

## 2010-12-15 NOTE — Op Note (Signed)
[name], Drew Cuevas                ACCOUNT NO.:  [id number]   MEDICAL RECORD NO.:  [id number]          PATIENT TYPE:  OBV   LOCATION:  0476                         FACILITY:  [facility]   PHYSICIAN:  Velora Heckler, MD      DATE OF BIRTH:  23-Jun-1971   DATE OF PROCEDURE:  11/18/2004  DATE OF DISCHARGE:                                 OPERATIVE REPORT   PREOPERATIVE DIAGNOSIS:  Perirectal abscess.   POSTOPERATIVE DIAGNOSIS:  Perirectal abscess.   PROCEDURE:  Incision and drainage of extensive perirectal abscess with open  packing.   SURGEON:  Velora Heckler, MD   ANESTHESIA:  General.   ESTIMATED BLOOD LOSS:  Minimal.   PREPARATION:  Betadine.   COMPLICATIONS:  None.   INDICATIONS:  The patient is a 39 year old black male presents with a 12-day  history of perianal and left buttock pain.  The patient presented to the  emergency department for assessment.  Dr. Linwood Dibbles contacted general  surgery for consultation.  Diagnosis of perirectal abscess was made.  The  patient is now prepared for the operating room.   Procedure is done in OR #10 at the [facility].  The  patient is brought to the operating room, placed in a supine position on the  operating room table.  Following administration of general anesthesia, the  patient is prepped and draped in the usual strict aseptic fashion.  After  ascertaining that an adequate level of anesthesia been obtained, the  fluctuant mass in the medial portion of the right buttock is incised with a  #15 blade.  Dissection is carried through subcutaneous tissues, and the  abscess cavity is entered.  A copious amount of anaerobic pus is evacuated.  There is a strong odor.  There is at least 150 mL of purulent fluid.  This  was evacuated with suction aspiration.  Digital examination of the abscess  cavity shows an extensive abscess cavity tracking  posteriorly to well above the level of the levator muscles.  The cavity is  manually  debrided and then irrigated copiously with warm saline.  Cavity is  then packed with 1-inch Iodoform gauze packing.  Dry gauze dressings and ABD  pads are placed.  The patient tolerated the procedure well.      TMG/MEDQ  D:  11/18/2004  T:  11/18/2004  Job:  [id number]   cc:   [name], MD  Fax: [phone]

## 2010-12-15 NOTE — H&P (Signed)
Drew Cuevas, Drew Cuevas NO.:  [id number]   MEDICAL RECORD NO.:  [id number]          PATIENT TYPE:  OBV   LOCATION:  0102                         FACILITY:  [facility]   PHYSICIAN:  Velora Heckler, MD      DATE OF BIRTH:  [birth year]   DATE OF ADMISSION:  11/18/2004  DATE OF DISCHARGE:                                HISTORY & PHYSICAL   REFERRING PHYSICIAN:  Dr. Linwood Dibbles, emergency department.   CHIEF COMPLAINT:  Left buttock pain.   HISTORY OF PRESENT ILLNESS:  The patient is a 40 year old black male,  presents with a 12 day history of increasing pain and swelling in the medial  left buttock.  He notes increased pain with bowel movement.  He denies  fevers, chills.  He denies drainage.  He does give a history of previous  perirectal abscess which required operative intervention with incision and  drainage by Dr. Danna Hefty approximately 3 years ago.  The patient  now has similar symptoms.  The patient was seen in the emergency department  by Dr. Linwood Dibbles.  General surgery was consulted.   PAST MEDICAL HISTORY:  History of perirectal abscess.   MEDICATIONS:  Vicodin p.r.n.   ALLERGIES:  1.  PENICILLIN.  2.  IBUPROFEN.   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  He uses marijuana.  He denies alcohol use.  He does smoke tobacco.  He lives alone.   FAMILY HISTORY:  Noncontributory.   REVIEW OF SYSTEMS:  15 system review otherwise unremarkable.   PHYSICAL EXAMINATION:  GENERAL:  A 40 year old well-developed, well-  nourished black male, no acute distress.  VITAL SIGNS:  Temp 98.0, pulse 112, respirations 20, blood pressure 116/69,  O2 saturation 98% room air.  HEENT:  Normocephalic, atraumatic, sclerae clear, conjunctivae clear, pupils  equal and reactive, dentition fair, mucous membranes moist.  NECK:  Supple without mass.  Thyroid normal without nodularity.  There is no  anterior or posterior cervical  lymphadenopathy.  Voice quality is normal.  LUNGS:  Clear to auscultation bilaterally without rales, rhonchi, or wheeze.  CARDIAC:  Regular rate and rhythm without murmur.  Peripheral pulses are  full.  ABDOMEN:  Soft without distention.  It is nontender.  There is no  hepatosplenomegaly.  EXTREMITIES:  Nontender without edema.  GENITOURINARY EXAM:  Perineum shows an obvious indurated, fluctuant lesion  in the left posterior perineum.  There is an old surgical wound at this  site.  This is consistent with recurrent perirectal abscess.  It is  exquisitely tender.  NEUROLOGIC:  The patient is alert and oriented without focal deficit.   LABORATORY DATA:  CBC, BMET, and prothrombin time pending at the time of  dictation.   EKG pending at the time of dictation.   IMPRESSION:  Perirectal abscess.   PLAN:  1.  Admission to [facility].  2.  Laboratory studies and EKG in preparation for surgery.  3.  To OR for incision and drainage.  4.  Routine postoperative care.  TMG/MEDQ  D:  11/18/2004  T:  11/18/2004  Job:  [id number]   cc:   [name], MD  Fax: [phone]

## 2010-12-17 ENCOUNTER — Emergency Department (HOSPITAL_COMMUNITY)
Admission: EM | Admit: 2010-12-17 | Discharge: 2010-12-17 | Disposition: A | Discharge status: Home / Self Care | Payer: Self-pay | Attending: Emergency Medicine | Admitting: Emergency Medicine

## 2010-12-17 ENCOUNTER — Emergency Department (HOSPITAL_COMMUNITY): Payer: Self-pay

## 2010-12-17 DIAGNOSIS — Z21 Asymptomatic human immunodeficiency virus [HIV] infection status: Secondary | ICD-10-CM | POA: Insufficient documentation

## 2010-12-17 DIAGNOSIS — Z79899 Other long term (current) drug therapy: Secondary | ICD-10-CM | POA: Insufficient documentation

## 2010-12-17 DIAGNOSIS — M25519 Pain in unspecified shoulder: Secondary | ICD-10-CM | POA: Insufficient documentation

## 2010-12-21 ENCOUNTER — Telehealth: Payer: Self-pay | Admitting: *Deleted

## 2010-12-21 NOTE — Telephone Encounter (Signed)
rec'd faxed letter from [facility]. They were unable to find a volunteer orthopedist. Pt got the same letter. They will notify us when one becomes available

## 2011-01-16 ENCOUNTER — Emergency Department (HOSPITAL_COMMUNITY)
Admission: EM | Admit: 2011-01-16 | Discharge: 2011-01-16 | Disposition: A | Discharge status: Home / Self Care | Payer: Self-pay | Attending: Emergency Medicine | Admitting: Emergency Medicine

## 2011-01-16 DIAGNOSIS — M25569 Pain in unspecified knee: Secondary | ICD-10-CM | POA: Insufficient documentation

## 2011-01-16 DIAGNOSIS — Z21 Asymptomatic human immunodeficiency virus [HIV] infection status: Secondary | ICD-10-CM | POA: Insufficient documentation

## 2011-02-13 ENCOUNTER — Emergency Department (HOSPITAL_COMMUNITY)
Admission: EM | Admit: 2011-02-13 | Discharge: 2011-02-13 | Disposition: A | Discharge status: Home / Self Care | Payer: Self-pay | Attending: Emergency Medicine | Admitting: Emergency Medicine

## 2011-02-13 ENCOUNTER — Emergency Department (HOSPITAL_COMMUNITY): Payer: Self-pay

## 2011-02-13 DIAGNOSIS — F329 Major depressive disorder, single episode, unspecified: Secondary | ICD-10-CM | POA: Insufficient documentation

## 2011-02-13 DIAGNOSIS — Z21 Asymptomatic human immunodeficiency virus [HIV] infection status: Secondary | ICD-10-CM | POA: Insufficient documentation

## 2011-02-13 DIAGNOSIS — M542 Cervicalgia: Secondary | ICD-10-CM | POA: Insufficient documentation

## 2011-02-13 DIAGNOSIS — Z79899 Other long term (current) drug therapy: Secondary | ICD-10-CM | POA: Insufficient documentation

## 2011-02-13 DIAGNOSIS — M545 Low back pain, unspecified: Secondary | ICD-10-CM | POA: Insufficient documentation

## 2011-02-13 DIAGNOSIS — F3289 Other specified depressive episodes: Secondary | ICD-10-CM | POA: Insufficient documentation

## 2011-02-27 ENCOUNTER — Telehealth: Payer: Self-pay | Admitting: *Deleted

## 2011-02-27 ENCOUNTER — Emergency Department (HOSPITAL_COMMUNITY)
Admission: EM | Admit: 2011-02-27 | Discharge: 2011-02-27 | Discharge status: Against Medical Advice | Payer: Self-pay | Attending: Emergency Medicine | Admitting: Emergency Medicine

## 2011-02-27 ENCOUNTER — Emergency Department (HOSPITAL_COMMUNITY): Payer: Self-pay

## 2011-02-27 DIAGNOSIS — Z0389 Encounter for observation for other suspected diseases and conditions ruled out: Secondary | ICD-10-CM | POA: Insufficient documentation

## 2011-02-27 NOTE — Telephone Encounter (Signed)
Patient called c/o leg and back pain.  Told him that we had tried to refer to ortho through [facility] and they did not have any participating doctors.  We may have to refer to [facility] outpatient ortho, and this can take a couple of weeks for the referral. Drew Cuevas CMA

## 2011-02-28 ENCOUNTER — Other Ambulatory Visit: Payer: Self-pay

## 2011-02-28 ENCOUNTER — Emergency Department (HOSPITAL_COMMUNITY)
Admission: EM | Admit: 2011-02-28 | Discharge: 2011-02-28 | Disposition: A | Discharge status: Home / Self Care | Payer: Self-pay | Attending: Emergency Medicine | Admitting: Emergency Medicine

## 2011-02-28 ENCOUNTER — Emergency Department (HOSPITAL_COMMUNITY): Payer: Self-pay

## 2011-02-28 ENCOUNTER — Ambulatory Visit: Payer: Self-pay

## 2011-02-28 DIAGNOSIS — W219XXA Striking against or struck by unspecified sports equipment, initial encounter: Secondary | ICD-10-CM | POA: Insufficient documentation

## 2011-02-28 DIAGNOSIS — M25469 Effusion, unspecified knee: Secondary | ICD-10-CM | POA: Insufficient documentation

## 2011-02-28 DIAGNOSIS — Z21 Asymptomatic human immunodeficiency virus [HIV] infection status: Secondary | ICD-10-CM | POA: Insufficient documentation

## 2011-02-28 DIAGNOSIS — Z79899 Other long term (current) drug therapy: Secondary | ICD-10-CM | POA: Insufficient documentation

## 2011-02-28 DIAGNOSIS — S8990XA Unspecified injury of unspecified lower leg, initial encounter: Secondary | ICD-10-CM | POA: Insufficient documentation

## 2011-02-28 DIAGNOSIS — F329 Major depressive disorder, single episode, unspecified: Secondary | ICD-10-CM | POA: Insufficient documentation

## 2011-02-28 DIAGNOSIS — IMO0002 Reserved for concepts with insufficient information to code with codable children: Secondary | ICD-10-CM | POA: Insufficient documentation

## 2011-02-28 DIAGNOSIS — M25569 Pain in unspecified knee: Secondary | ICD-10-CM | POA: Insufficient documentation

## 2011-02-28 DIAGNOSIS — F3289 Other specified depressive episodes: Secondary | ICD-10-CM | POA: Insufficient documentation

## 2011-02-28 DIAGNOSIS — Y9239 Other specified sports and athletic area as the place of occurrence of the external cause: Secondary | ICD-10-CM | POA: Insufficient documentation

## 2011-02-28 DIAGNOSIS — I10 Essential (primary) hypertension: Secondary | ICD-10-CM | POA: Insufficient documentation

## 2011-02-28 DIAGNOSIS — Y9361 Activity, american tackle football: Secondary | ICD-10-CM | POA: Insufficient documentation

## 2011-03-01 ENCOUNTER — Other Ambulatory Visit (INDEPENDENT_AMBULATORY_CARE_PROVIDER_SITE_OTHER): Payer: Self-pay

## 2011-03-01 DIAGNOSIS — B20 Human immunodeficiency virus [HIV] disease: Secondary | ICD-10-CM

## 2011-03-01 LAB — CBC WITH DIFFERENTIAL/PLATELET
Eosinophils Absolute: 0.2 10*3/uL (ref 0.0–0.7)
Hemoglobin: 15 g/dL (ref 13.0–17.0)
Lymphs Abs: 2.3 10*3/uL (ref 0.7–4.0)
MCH: 32.8 pg (ref 26.0–34.0)
MCV: 95.8 fL (ref 78.0–100.0)
Monocytes Relative: 3 % (ref 3–12)
Neutrophils Relative %: 68 % (ref 43–77)
RBC: 4.57 MIL/uL (ref 4.22–5.81)

## 2011-03-02 LAB — T-HELPER CELL (CD4) - (RCID CLINIC ONLY): CD4 % Helper T Cell: 25 % — ABNORMAL LOW (ref 33–55)

## 2011-03-02 LAB — COMPLETE METABOLIC PANEL WITH GFR
ALT: 40 U/L (ref 0–53)
AST: 31 U/L (ref 0–37)
Alkaline Phosphatase: 85 U/L (ref 39–117)
Creat: 1.2 mg/dL (ref 0.50–1.35)
GFR, Est African American: >60 mL/min (ref >60)
Total Bilirubin: 0.2 mg/dL — ABNORMAL LOW (ref 0.3–1.2)

## 2011-03-05 LAB — HIV-1 RNA QUANT-NO REFLEX-BLD
HIV 1 RNA Quant: <20 copies/mL (ref <20)
HIV-1 RNA Quant, Log: <1.3 {Log} (ref <1.30)

## 2011-03-14 ENCOUNTER — Ambulatory Visit: Payer: Self-pay

## 2011-03-14 ENCOUNTER — Encounter: Payer: Self-pay | Admitting: Adult Health

## 2011-03-14 ENCOUNTER — Ambulatory Visit (INDEPENDENT_AMBULATORY_CARE_PROVIDER_SITE_OTHER): Payer: Self-pay | Admitting: Adult Health

## 2011-03-14 VITALS — BP 139/94 | HR 71 | Temp 98.5°F | Ht 65.0 in | Wt 217.0 lb

## 2011-03-14 DIAGNOSIS — Z23 Encounter for immunization: Secondary | ICD-10-CM

## 2011-03-14 DIAGNOSIS — M25559 Pain in unspecified hip: Secondary | ICD-10-CM

## 2011-03-14 DIAGNOSIS — M25551 Pain in right hip: Secondary | ICD-10-CM

## 2011-03-14 DIAGNOSIS — B20 Human immunodeficiency virus [HIV] disease: Secondary | ICD-10-CM

## 2011-03-14 MED ORDER — HYDROCODONE-ACETAMINOPHEN 5-325 MG PO TABS: 1.0000 | ORAL_TABLET | Freq: Four times a day (QID) | ORAL | Status: AC | PRN

## 2011-03-14 NOTE — Patient Instructions (Signed)

## 2011-03-14 NOTE — Progress Notes (Signed)
  Subjective:    Patient ID: Drew Cuevas, male    DOB: 1970/11/19, 40 y.o.   MRN: [id number]  HPI Presents to clinic for scheduled followup. Endorses adherence to his antiretroviral medications with good tolerance and no complications. He continues to complain of pain that radiates from the top of his right femur down to his right knee. Radiographic evaluations both ordered by [place] and by the emergency department showed no active pathology, however, no evaluations of the right hip were made. He states that the pain occurs most often when he is full weightbearing in standing. 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 He states that after a period of time, he has difficulty, ambulating with pain and stiffness in the right femur area.   Review of Systems  Constitutional: Negative.   HENT: Negative.   Eyes: Negative.   Respiratory: Positive for wheezing.   Cardiovascular: Negative.   Gastrointestinal: Negative.   Genitourinary: Negative.   Musculoskeletal: Positive for myalgias, arthralgias and gait problem.  Skin: Negative.   Neurological: Negative.   Hematological: Negative.   Psychiatric/Behavioral: Negative.        Objective:   Physical Exam  Constitutional: He is oriented to person, place, and time. He appears well-developed. No distress.       Obese appearing  HENT:  Head: Normocephalic and atraumatic.  Eyes: Conjunctivae and EOM are normal. Pupils are equal, round, and reactive to light.  Neck: Normal range of motion. Neck supple.  Cardiovascular: Normal rate and regular rhythm.   Pulmonary/Chest: Effort normal.  Abdominal: Soft. Bowel sounds are normal.  Musculoskeletal: He exhibits tenderness. He exhibits no edema.       Some point tenderness noted at the greater trochanter area on the right, but no range of motion difficulties observed.  Neurological: He is alert and oriented to person, place, and time. No cranial nerve deficit. He  exhibits normal muscle tone. Coordination normal.  Skin: Skin is warm and dry.  Psychiatric: He has a normal mood and affect. His behavior is normal. Judgment and thought content normal.          Assessment & Plan:  1. HIV. Labs obtained 03/01/2011 show a CD4 count of 610 at 25% with a viral load of less than 20 copies per mL. Clinically stable on current regimen. Recommend continuing present management, and with respect to his HIV, followup. Should be within 4 months with repeat staging labs 2 weeks before next appointment.  2. Pain In Right Hip and Right Thigh. As we have not had radiographic evaluation of the right hip and given the normal. Results of other radiographic films, we will order an MRI of the right hip to determine whether or not there's any bony or abnormalities or other pathology before any further evaluation or referral. We will also give him hydrocodone/APAP 5/325 one by mouth every 6 hours when necessary for pain. We will ask that he return to clinic in 2 weeks for followup. After reviewing the results of his MRI.  He verbally acknowledged all information that was provided to him and agreed with plan of care.

## 2011-03-26 ENCOUNTER — Inpatient Hospital Stay (HOSPITAL_COMMUNITY): Admission: RE | Admit: 2011-03-26 | Payer: Self-pay | Source: Ambulatory Visit

## 2011-03-29 ENCOUNTER — Ambulatory Visit (HOSPITAL_COMMUNITY)
Admission: RE | Admit: 2011-03-29 | Discharge: 2011-03-29 | Disposition: A | Discharge status: Home / Self Care | Payer: Self-pay | Source: Ambulatory Visit | Attending: Adult Health | Admitting: Adult Health

## 2011-03-29 DIAGNOSIS — M25559 Pain in unspecified hip: Secondary | ICD-10-CM | POA: Insufficient documentation

## 2011-03-29 DIAGNOSIS — M25551 Pain in right hip: Secondary | ICD-10-CM

## 2011-03-29 MED ORDER — GADOBENATE DIMEGLUMINE 529 MG/ML IV SOLN
20.0000 mL | Freq: Once | INTRAVENOUS | Status: AC
  Administered 2011-03-29: 20 mL via INTRAVENOUS

## 2011-04-17 ENCOUNTER — Ambulatory Visit: Payer: Self-pay | Admitting: Adult Health

## 2011-04-30 ENCOUNTER — Encounter: Payer: Self-pay | Admitting: Infectious Diseases

## 2011-04-30 ENCOUNTER — Ambulatory Visit (INDEPENDENT_AMBULATORY_CARE_PROVIDER_SITE_OTHER): Payer: Self-pay | Admitting: Infectious Diseases

## 2011-04-30 DIAGNOSIS — B20 Human immunodeficiency virus [HIV] disease: Secondary | ICD-10-CM

## 2011-04-30 DIAGNOSIS — M25561 Pain in right knee: Secondary | ICD-10-CM | POA: Insufficient documentation

## 2011-04-30 DIAGNOSIS — M25569 Pain in unspecified knee: Secondary | ICD-10-CM

## 2011-04-30 DIAGNOSIS — R21 Rash and other nonspecific skin eruption: Secondary | ICD-10-CM | POA: Insufficient documentation

## 2011-04-30 DIAGNOSIS — Z23 Encounter for immunization: Secondary | ICD-10-CM

## 2011-04-30 MED ORDER — HYDROCODONE-ACETAMINOPHEN 5-500 MG PO TABS: 1.0000 | ORAL_TABLET | Freq: Four times a day (QID) | ORAL | Status: AC | PRN

## 2011-04-30 NOTE — Assessment & Plan Note (Signed)
Would ? If this is psoriatic arthritis?  He has had improvement with use of brace, encourage him to continue this. Will write him rx fr low dose pain medication. He is allergic to NSAID (i almost died) unfortunately, this would be first choice.

## 2011-04-30 NOTE — Progress Notes (Signed)
  Subjective:    Patient ID: Deshane Cotroneo, male    DOB: 25-Mar-1971, 40 y.o.   MRN: [id number]  HPI 40 yo M with HIV+ on atripla, CD4 810, VL <20 (03-01-11) Has been having R knee pain for several months. Has since developed hip pain as well. 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 Has changed his shoes several times. Has not gotten into see ortho yet.   Review of Systems  Constitutional: Negative for fever, chills and unexpected weight change.  Gastrointestinal: Negative for diarrhea and constipation.  Genitourinary: Negative for dysuria.  Musculoskeletal: Positive for joint swelling.       Objective:   Physical Exam  Constitutional: He appears well-developed and well-nourished.  Eyes: EOM are normal. Pupils are equal, round, and reactive to light.  Neck: Neck supple.  Cardiovascular: Normal rate, regular rhythm and normal heart sounds.   Pulmonary/Chest: Effort normal and breath sounds normal.  Abdominal: Soft. Bowel sounds are normal. There is no tenderness.  Musculoskeletal:       Legs: Lymphadenopathy:    He has no cervical adenopathy.  Skin:       Multiple small placques on hands and feet.           Assessment & Plan:

## 2011-04-30 NOTE — Assessment & Plan Note (Signed)
Again, would ? That this is psoriasis. wil ask for derm eval.

## 2011-04-30 NOTE — Assessment & Plan Note (Signed)
He is doing well, flu shot up to date. Offered condoms. Will give Hep A #2 Will see him back in 4-5 months.

## 2011-04-30 NOTE — Progress Notes (Signed)
Addended by: Wendall Mola A on: 04/30/2011 11:33 AM   Modules accepted: Orders

## 2011-05-01 LAB — URINALYSIS, ROUTINE W REFLEX MICROSCOPIC
Bilirubin Urine: NEGATIVE
Glucose, UA: NEGATIVE
Hgb urine dipstick: NEGATIVE
Hgb urine dipstick: NEGATIVE
Ketones, ur: NEGATIVE
Nitrite: NEGATIVE
Nitrite: NEGATIVE
Protein, ur: NEGATIVE
Protein, ur: NEGATIVE
Specific Gravity, Urine: 1.023
Specific Gravity, Urine: 1.027
Urobilinogen, UA: 0.2
Urobilinogen, UA: 0.2
pH: 7

## 2011-05-01 LAB — COMPREHENSIVE METABOLIC PANEL
AST: 34
Albumin: 3.5
Alkaline Phosphatase: 106
BUN: 5 — ABNORMAL LOW
BUN: 9
CO2: 24
Calcium: 9.1
Chloride: 102
Chloride: 108
Creatinine, Ser: 0.99
Creatinine, Ser: 1.01
GFR calc Af Amer: >60
GFR calc non Af Amer: >60
GFR calc non Af Amer: >60
Glucose, Bld: 98
Potassium: 3.9
Total Bilirubin: 0.5
Total Bilirubin: 1

## 2011-05-01 LAB — COMPREHENSIVE METABOLIC PANEL WITH GFR
ALT: 36
Albumin: 3.9
Alkaline Phosphatase: 98
Glucose, Bld: 85
Potassium: 4.1
Sodium: 134 — ABNORMAL LOW
Total Protein: 7.2

## 2011-05-01 LAB — CBC
HCT: 44.6
HCT: 44.8
Hemoglobin: 15
Hemoglobin: 15.7
MCHC: 35
MCV: 93.3
MCV: 95.5
Platelets: 177
Platelets: 194
RBC: 4.8
RDW: 13.3
RDW: 13.5
WBC: 11.4 — ABNORMAL HIGH
WBC: 5.8

## 2011-05-01 LAB — URINE CULTURE
Colony Count: NO GROWTH
Culture: NO GROWTH

## 2011-05-01 LAB — DIFFERENTIAL
Basophils Absolute: 0
Basophils Absolute: 0
Basophils Relative: 0
Basophils Relative: 1
Eosinophils Absolute: 0.3
Eosinophils Relative: 5
Lymphocytes Relative: 18
Lymphocytes Relative: 33
Lymphs Abs: 1.9
Monocytes Absolute: 0.5
Monocytes Relative: 8
Neutro Abs: 3.1
Neutro Abs: 8.3 — ABNORMAL HIGH
Neutrophils Relative %: 53
Neutrophils Relative %: 73

## 2011-05-01 LAB — LIPASE, BLOOD
Lipase: 23
Lipase: 35

## 2011-05-03 ENCOUNTER — Ambulatory Visit: Payer: Self-pay | Admitting: Family Medicine

## 2011-05-04 LAB — T-HELPER CELL (CD4) - (RCID CLINIC ONLY)
CD4 % Helper T Cell: 23 % — ABNORMAL LOW (ref 33–55)
CD4 T Cell Abs: 460 uL (ref 400–2700)

## 2011-05-11 LAB — CULTURE, ROUTINE-ABSCESS
Culture: NO GROWTH
Gram Stain: NONE SEEN

## 2011-05-14 ENCOUNTER — Ambulatory Visit: Payer: Self-pay | Admitting: Family Medicine

## 2011-05-14 LAB — T-HELPER CELL (CD4) - (RCID CLINIC ONLY): CD4 T Cell Abs: 490

## 2011-05-16 LAB — COMPREHENSIVE METABOLIC PANEL
Albumin: 3.3 — ABNORMAL LOW
BUN: 8
Creatinine, Ser: 1.14
GFR calc Af Amer: >60
Potassium: 4.3
Total Protein: 7.1

## 2011-05-16 LAB — URINALYSIS, ROUTINE W REFLEX MICROSCOPIC
Glucose, UA: NEGATIVE
Ketones, ur: NEGATIVE
Protein, ur: NEGATIVE
Urobilinogen, UA: 1

## 2011-05-16 LAB — CBC
HCT: 39.8
MCV: 93.3
Platelets: 169
RDW: 15.1 — ABNORMAL HIGH

## 2011-05-16 LAB — DIFFERENTIAL
Lymphocytes Relative: 42
Monocytes Absolute: 0.4
Monocytes Relative: 7
Neutro Abs: 2.3

## 2011-06-04 ENCOUNTER — Telehealth: Payer: Self-pay | Admitting: *Deleted

## 2011-06-04 NOTE — Telephone Encounter (Signed)
Called [facility] to inquire about Dermatology referral.  This was sent 04/30/11, left message with Judeth Cornfield to call me back regarding status of this referral. Wendall Mola CMA

## 2011-06-12 ENCOUNTER — Other Ambulatory Visit: Payer: Self-pay | Admitting: Infectious Diseases

## 2011-06-12 ENCOUNTER — Ambulatory Visit: Payer: Self-pay

## 2011-06-12 ENCOUNTER — Other Ambulatory Visit: Payer: Self-pay

## 2011-06-12 DIAGNOSIS — B20 Human immunodeficiency virus [HIV] disease: Secondary | ICD-10-CM

## 2011-06-13 ENCOUNTER — Ambulatory Visit (INDEPENDENT_AMBULATORY_CARE_PROVIDER_SITE_OTHER): Payer: Self-pay | Admitting: *Deleted

## 2011-06-13 DIAGNOSIS — B20 Human immunodeficiency virus [HIV] disease: Secondary | ICD-10-CM

## 2011-06-13 LAB — COMPLETE METABOLIC PANEL WITH GFR
AST: 27 U/L (ref 0–37)
Albumin: 4.2 g/dL (ref 3.5–5.2)
BUN: 15 mg/dL (ref 6–23)
CO2: 23 mEq/L (ref 19–32)
Calcium: 9.3 mg/dL (ref 8.4–10.5)
Chloride: 107 mEq/L (ref 96–112)
Glucose, Bld: 74 mg/dL (ref 70–99)
Potassium: 4.5 mEq/L (ref 3.5–5.3)

## 2011-06-13 LAB — CBC
HCT: 44.1 % (ref 39.0–52.0)
Hemoglobin: 15 g/dL (ref 13.0–17.0)
RBC: 4.66 MIL/uL (ref 4.22–5.81)
RDW: 13.2 % (ref 11.5–15.5)
WBC: 5.3 10*3/uL (ref 4.0–10.5)

## 2011-06-13 LAB — HIV-1 RNA QUANT-NO REFLEX-BLD

## 2011-06-13 LAB — T-HELPER CELL (CD4) - (RCID CLINIC ONLY)
CD4 % Helper T Cell: 27 % — ABNORMAL LOW (ref 33–55)
CD4 T Cell Abs: 740 uL (ref 400–2700)

## 2011-06-13 LAB — LIPID PANEL
Cholesterol: 205 mg/dL — ABNORMAL HIGH (ref 0–200)
VLDL: 19 mg/dL (ref 0–40)

## 2011-06-15 ENCOUNTER — Telehealth: Payer: Self-pay | Admitting: *Deleted

## 2011-06-15 NOTE — Telephone Encounter (Signed)
Notified patient that at this time there are not any dermatologists participating with the discount card.  Received notification of this through [facility]. Wendall Mola CMA

## 2011-06-28 ENCOUNTER — Emergency Department (HOSPITAL_COMMUNITY)
Admission: EM | Admit: 2011-06-28 | Discharge: 2011-06-29 | Disposition: A | Discharge status: Home / Self Care | Payer: Self-pay | Attending: Emergency Medicine | Admitting: Emergency Medicine

## 2011-06-28 ENCOUNTER — Emergency Department (HOSPITAL_COMMUNITY): Payer: Self-pay

## 2011-06-28 ENCOUNTER — Other Ambulatory Visit: Payer: Self-pay | Admitting: *Deleted

## 2011-06-28 ENCOUNTER — Encounter (HOSPITAL_COMMUNITY): Payer: Self-pay | Admitting: Emergency Medicine

## 2011-06-28 DIAGNOSIS — S7001XA Contusion of right hip, initial encounter: Secondary | ICD-10-CM

## 2011-06-28 DIAGNOSIS — Z21 Asymptomatic human immunodeficiency virus [HIV] infection status: Secondary | ICD-10-CM | POA: Insufficient documentation

## 2011-06-28 DIAGNOSIS — W19XXXA Unspecified fall, initial encounter: Secondary | ICD-10-CM

## 2011-06-28 DIAGNOSIS — M25559 Pain in unspecified hip: Secondary | ICD-10-CM | POA: Insufficient documentation

## 2011-06-28 DIAGNOSIS — M545 Low back pain, unspecified: Secondary | ICD-10-CM

## 2011-06-28 DIAGNOSIS — Z79899 Other long term (current) drug therapy: Secondary | ICD-10-CM | POA: Insufficient documentation

## 2011-06-28 DIAGNOSIS — S7000XA Contusion of unspecified hip, initial encounter: Secondary | ICD-10-CM | POA: Insufficient documentation

## 2011-06-28 DIAGNOSIS — W11XXXA Fall on and from ladder, initial encounter: Secondary | ICD-10-CM | POA: Insufficient documentation

## 2011-06-28 DIAGNOSIS — F17200 Nicotine dependence, unspecified, uncomplicated: Secondary | ICD-10-CM | POA: Insufficient documentation

## 2011-06-28 DIAGNOSIS — B20 Human immunodeficiency virus [HIV] disease: Secondary | ICD-10-CM

## 2011-06-28 DIAGNOSIS — I10 Essential (primary) hypertension: Secondary | ICD-10-CM | POA: Insufficient documentation

## 2011-06-28 HISTORY — DX: Asymptomatic human immunodeficiency virus (hiv) infection status: Z21

## 2011-06-28 HISTORY — DX: Essential (primary) hypertension: I10

## 2011-06-28 HISTORY — DX: Human immunodeficiency virus (HIV) disease: B20

## 2011-06-28 MED ORDER — EFAVIRENZ-EMTRICITAB-TENOFOVIR 600-200-300 MG PO TABS: 1.0000 | ORAL_TABLET | Freq: Every day | ORAL | Status: DC

## 2011-06-28 MED ORDER — HYDROCODONE-ACETAMINOPHEN 5-325 MG PO TABS: 1.0000 | ORAL_TABLET | ORAL | Status: DC | PRN

## 2011-06-28 MED ORDER — HYDROCODONE-ACETAMINOPHEN 5-325 MG PO TABS: 1.0000 | ORAL_TABLET | ORAL | Status: AC | PRN

## 2011-06-28 MED ORDER — OXYCODONE-ACETAMINOPHEN 5-325 MG PO TABS
2.0000 | ORAL_TABLET | Freq: Once | ORAL | Status: AC
  Administered 2011-06-28: 2 via ORAL
  Filled 2011-06-28: qty 2

## 2011-06-28 NOTE — ED Provider Notes (Signed)
History     CSN: [id number] Arrival date & time: 06/28/2011  8:13 PM   First MD Initiated Contact with Patient 06/28/11 2333      Chief Complaint  Patient presents with  . Fall    (Consider location/radiation/quality/duration/timing/severity/associated sxs/prior treatment) Patient is a 40 y.o. male presenting with fall. The history is provided by the patient.  Fall  fall from ladder while putting up home christmas lights. Pain in right hip and lower back. Pain worse with movement and palpation. Pain is constant. Pain is moderate in severity. Denies neck pain and upper and lower extremity weakness.   Past Medical History  Diagnosis Date  . Hypertension   . HIV (human immunodeficiency virus infection)     History reviewed. No pertinent past surgical history.  No family history on file.  History  Substance Use Topics  . Smoking status: Current Everyday Smoker -- 0.3 packs/day for 23 years    Types: Cigarettes  . Smokeless tobacco: Never Used  . Alcohol Use: No      Review of Systems  All other systems reviewed and are negative.    Allergies  Ibuprofen  Home Medications   Current Outpatient Rx  Name Route Sig Dispense Refill  . CITALOPRAM HYDROBROMIDE 40 MG PO TABS Oral Take 1 tablet (40 mg total) by mouth daily. 90 tablet 2  . EFAVIRENZ-EMTRICITAB-TENOFOVIR 600-200-300 MG PO TABS Oral Take 1 tablet by mouth at bedtime. 30 tablet 6    BP 126/87  Pulse 79  Temp(Src) 98.6 F (37 C) (Oral)  Resp 18  SpO2 98%  Physical Exam  Nursing note and vitals reviewed. Constitutional: He is oriented to person, place, and time. He appears well-developed and well-nourished.  HENT:  Head: Normocephalic.  Eyes: EOM are normal.  Neck: Normal range of motion. Neck supple.  Pulmonary/Chest: Effort normal.  Musculoskeletal: Normal range of motion.       Mild L spine and para L spine tenderness. Pain with palpation of right hip.   Neurological: He is alert and oriented to  person, place, and time.  Psychiatric: He has a normal mood and affect.    ED Course  Procedures (including critical care time)  Labs Reviewed - No data to display Dg Lumbar Spine Complete  06/28/2011  *RADIOLOGY REPORT*  Clinical Data: Fall  LUMBAR SPINE - COMPLETE 4+ VIEW  Comparison: 06/20/2008  Findings: Anatomic alignment.  No acute fracture.  Disc height is maintained.  IMPRESSION: No acute bony pathology in the lumbar spine.  Original Report Authenticated By: Donavan Burnet, M.D.   Dg Hip Complete Right  06/28/2011  *RADIOLOGY REPORT*  Clinical Data: 40 year old male with right hip pain following fall.  RIGHT HIP - COMPLETE 2+ VIEW  Comparison: None  Findings: No evidence of acute fracture, subluxation or dislocation identified.  No radio-opaque foreign bodies are present.  No focal bony lesions are noted.  The joint spaces are unremarkable.  IMPRESSION: No acute bony abnormalities.  Original Report Authenticated By: Rosendo Gros, M.D.   i personally reviewed the xrays  1. Contusion of right hip   2. Fall   3. Pain in lower back       MDM  Contusion without fracture. Pain treated in ER. Home with pain meds        Lyanne Co, MD 06/28/11 2342

## 2011-06-28 NOTE — ED Notes (Signed)
Pt presents to department for evaluation of fall. States he was on ladder putting christmas tree lights up when he slipped and fell off. Pt states he landed on his back. Now c/o lower back and R sided hip pain. No obvious deformities noted. Pt ambulatory without difficulty. He is alert and oriented x4. Denies LOC. No signs of distress at the present.

## 2011-06-28 NOTE — ED Notes (Signed)
PT. SLIPPED AND FELL FROM LADDER THIS EVENING WHILE INSTALLING CHRISTMAS LIGHTS , PRESENTS WITH LOW BACK AND RIGHT HIP PAIN , AMBULATORY.

## 2011-06-28 NOTE — ED Notes (Signed)
Pt discharged home. No further questions regarding medication/follow up. Vital signs stable at discharge.

## 2011-07-06 ENCOUNTER — Emergency Department (INDEPENDENT_AMBULATORY_CARE_PROVIDER_SITE_OTHER)
Admission: EM | Admit: 2011-07-06 | Discharge: 2011-07-06 | Disposition: A | Discharge status: Other Acute Inpatient Hospital | Payer: Self-pay | Source: Home / Self Care | Attending: Emergency Medicine | Admitting: Emergency Medicine

## 2011-07-06 ENCOUNTER — Encounter (HOSPITAL_COMMUNITY): Payer: Self-pay | Admitting: Emergency Medicine

## 2011-07-06 ENCOUNTER — Emergency Department (HOSPITAL_COMMUNITY)
Admission: EM | Admit: 2011-07-06 | Discharge: 2011-07-06 | Discharge status: Against Medical Advice | Payer: Self-pay | Attending: Emergency Medicine | Admitting: Emergency Medicine

## 2011-07-06 DIAGNOSIS — L290 Pruritus ani: Secondary | ICD-10-CM

## 2011-07-06 DIAGNOSIS — R197 Diarrhea, unspecified: Secondary | ICD-10-CM | POA: Insufficient documentation

## 2011-07-06 DIAGNOSIS — R21 Rash and other nonspecific skin eruption: Secondary | ICD-10-CM | POA: Insufficient documentation

## 2011-07-06 DIAGNOSIS — L0501 Pilonidal cyst with abscess: Secondary | ICD-10-CM

## 2011-07-06 DIAGNOSIS — L0291 Cutaneous abscess, unspecified: Secondary | ICD-10-CM | POA: Insufficient documentation

## 2011-07-06 MED ORDER — TRAMADOL HCL 50 MG PO TABS: 100.0000 mg | ORAL_TABLET | Freq: Three times a day (TID) | ORAL | Status: AC | PRN

## 2011-07-06 MED ORDER — CEPHALEXIN 500 MG PO CAPS: 500.0000 mg | ORAL_CAPSULE | Freq: Three times a day (TID) | ORAL | Status: AC

## 2011-07-06 MED ORDER — NYSTATIN 100000 UNIT/GM EX CREA: TOPICAL_CREAM | CUTANEOUS | Status: DC

## 2011-07-06 MED ORDER — METRONIDAZOLE 500 MG PO TABS: 500.0000 mg | ORAL_TABLET | Freq: Three times a day (TID) | ORAL | Status: AC

## 2011-07-06 NOTE — ED Provider Notes (Signed)
History     CSN: [id number] Arrival date & time: 07/06/2011  3:27 PM   First MD Initiated Contact with Patient 07/06/11 1603      Chief Complaint  Patient presents with  . Rash    (Consider location/radiation/quality/duration/timing/severity/associated sxs/prior treatment) HPI Comments: Drew Cuevas is a 40 year old HIV positive male who comes in today with several complaints including perianal rash and itching, and Benadryl and the diarrhea, and headache. He is on a triple for his HIV positivity. He states his last viral titer was undetectable, and he does not know what his CD4 count was.  The perianal rash has been going on for months. It comes and goes. Heitching, pain, and spots of blood. He denies any diarrhea or constipation prior to the last couple days. He's tried some over-the-counter creams, changing to the paper, and using baby wipes with out success.  He has a boil at this. End of the intergluteal cleft going on for the past 3 days. It's tender to touch and has not drained. He has had previous portals they're in the same place before. He denies any fever or chills.  For the past 2 days she's had some headache and for 3 days she's had frequent loose stools. He denies any abdominal pain, cramping, nausea, or vomiting.  Patient is a 40 y.o. male presenting with rash.  Rash     Past Medical History  Diagnosis Date  . Hypertension   . HIV (human immunodeficiency virus infection)     History reviewed. No pertinent past surgical history.  History reviewed. No pertinent family history.  History  Substance Use Topics  . Smoking status: Current Everyday Smoker -- 0.3 packs/day for 23 years    Types: Cigarettes  . Smokeless tobacco: Never Used  . Alcohol Use: No      Review of Systems  Constitutional: Negative for fever, chills, appetite change and unexpected weight change.  Respiratory: Negative for cough, shortness of breath and wheezing.   Cardiovascular: Negative for  chest pain.  Gastrointestinal: Positive for diarrhea, blood in stool, anal bleeding and rectal pain. Negative for nausea, vomiting, abdominal pain, constipation and abdominal distention.  Genitourinary: Negative for dysuria, urgency and frequency.  Skin: Positive for rash.  Neurological: Positive for headaches.    Allergies  Ibuprofen  Home Medications   Current Outpatient Rx  Name Route Sig Dispense Refill  . CITALOPRAM HYDROBROMIDE 40 MG PO TABS Oral Take 1 tablet (40 mg total) by mouth daily. 90 tablet 2  . EFAVIRENZ-EMTRICITAB-TENOFOVIR 600-200-300 MG PO TABS Oral Take 1 tablet by mouth at bedtime. 30 tablet 6  . HYDROCHLOROTHIAZIDE 12.5 MG PO CAPS Oral Take 12.5 mg by mouth daily.      . CEPHALEXIN 500 MG PO CAPS Oral Take 1 capsule (500 mg total) by mouth 3 (three) times daily. 30 capsule 0  . HYDROCODONE-ACETAMINOPHEN 5-325 MG PO TABS Oral Take 1 tablet by mouth every 4 (four) hours as needed for pain. 8 tablet 0  . METRONIDAZOLE 500 MG PO TABS Oral Take 1 tablet (500 mg total) by mouth 3 (three) times daily. 30 tablet 0  . NYSTATIN 100000 UNIT/GM EX CREA  Apply to affected area 2 times daily 30 g 0  . TRAMADOL HCL 50 MG PO TABS Oral Take 2 tablets (100 mg total) by mouth every 8 (eight) hours as needed for pain. Maximum dose= 8 tablets per day 30 tablet 0    BP 139/80  Pulse 76  Temp(Src) 98.5 F (36.9 C) (Oral)  Resp 16  SpO2 100%  Physical Exam  Nursing note and vitals reviewed. Constitutional: He appears well-developed and well-nourished. No distress.  Eyes: No scleral icterus.  Cardiovascular: Normal rate, regular rhythm and normal heart sounds.  Exam reveals no gallop and no friction rub.   No murmur heard. Pulmonary/Chest: Effort normal and breath sounds normal. No respiratory distress. He has no wheezes. He has no rales.  Abdominal: Soft. Bowel sounds are normal. He exhibits no distension and no mass. There is no hepatosplenomegaly. There is no tenderness. There  is no rebound, no guarding and no CVA tenderness.  Genitourinary:       Exam of his perianal area reveals some excoriation, but no rash and no lesions at all. In the intergluteal cleft on the left side towards the superior end, there was a small, 1 cm, indurated area. This was not fluctuant and not draining, and did not appear to need incision and drainage right now.  Skin: Skin is warm and dry. No rash noted. He is not diaphoretic.    ED Course  Procedures (including critical care time)  Labs Reviewed - No data to display No results found.   1. Anal itch   2. Pilonidal abscess       MDM  He has pruritus ani which may be idiopathic, or caused by yeast infection, bacterial infection, or pinworms. Right now we'll start off with a round of Keflex ineffective, a Vermox tablet might be with trying. If this is ineffective treatment for pruritus would be the next option which would include something like Analpram 1% or 2.5%.  He also has an early pilonidal cyst. This does not appear to need incision and drainage right now. Will ahead and treat with metronidazole and he's also on the cephalexin which should help.  The diarrhea and headache may be viral.        Roque Lias, MD 07/06/11 [phone]

## 2011-07-06 NOTE — ED Notes (Signed)
Called pt several times in all the waiting rooms, no answer

## 2011-07-06 NOTE — ED Notes (Signed)
Abcess on bottom, and rash around anus that has been there for months. PCP gave him hydrocortisone cream, did not work. He has also used benadryl cream and another cream that has not really helped. Pt also started having diarrhea 2 days ago. Pt has tried changing tissue, wipes, and other remedies. Pt has severe pain when wiping, and itchiness.

## 2011-08-15 ENCOUNTER — Ambulatory Visit: Payer: Self-pay

## 2011-08-28 ENCOUNTER — Ambulatory Visit: Payer: Self-pay

## 2011-08-29 ENCOUNTER — Encounter: Payer: Self-pay | Admitting: Infectious Disease

## 2011-08-29 ENCOUNTER — Ambulatory Visit (INDEPENDENT_AMBULATORY_CARE_PROVIDER_SITE_OTHER): Payer: Self-pay | Admitting: Infectious Disease

## 2011-08-29 ENCOUNTER — Ambulatory Visit: Payer: Self-pay

## 2011-08-29 VITALS — BP 132/88 | HR 89 | Temp 98.0°F | Ht 65.0 in | Wt 218.0 lb

## 2011-08-29 DIAGNOSIS — M25561 Pain in right knee: Secondary | ICD-10-CM

## 2011-08-29 DIAGNOSIS — M25569 Pain in unspecified knee: Secondary | ICD-10-CM

## 2011-08-29 DIAGNOSIS — I10 Essential (primary) hypertension: Secondary | ICD-10-CM

## 2011-08-29 DIAGNOSIS — B20 Human immunodeficiency virus [HIV] disease: Secondary | ICD-10-CM

## 2011-08-29 LAB — CK: Total CK: 326 U/L — ABNORMAL HIGH (ref 7–232)

## 2011-08-29 MED ORDER — HYDROCODONE-ACETAMINOPHEN 5-500 MG PO TABS: 1.0000 | ORAL_TABLET | Freq: Two times a day (BID) | ORAL | Status: AC | PRN

## 2011-08-29 NOTE — Assessment & Plan Note (Signed)
Refer to sports medicine. I cannot find anything clear cut causing these symptoms. Could consider MRI L spine. 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 WIll give him vicodin #6o for the time being

## 2011-08-29 NOTE — Assessment & Plan Note (Signed)
Superb control. 

## 2011-08-29 NOTE — Assessment & Plan Note (Signed)
Reasonable contrtol

## 2011-08-29 NOTE — Progress Notes (Signed)
  Subjective:    Patient ID: Drew Cuevas, male    DOB: 12/20/1970, 41 y.o.   MRN: [id number]  HPI  Drew Cuevas is a 41 y.o. male who is doing superbly well on  His antiviral regimen, with undetectable viral load and health cd4 count. He continues to complain of knee pain that starts in his knee and radiates up his thigh. He has had knee filsm that are unrevealing. He had MRI of hip which is unrevealing. He is allergic to ibuprofen and he does not have good pain control with his tylenol  Review of Systems  Constitutional: Negative for fever, chills, diaphoresis, activity change, appetite change, fatigue and unexpected weight change.  HENT: Negative for congestion, sore throat, rhinorrhea, sneezing, trouble swallowing and sinus pressure.   Eyes: Negative for photophobia and visual disturbance.  Respiratory: Negative for cough, chest tightness, shortness of breath, wheezing and stridor.   Cardiovascular: Negative for chest pain, palpitations and leg swelling.  Gastrointestinal: Negative for nausea, vomiting, abdominal pain, diarrhea, constipation, blood in stool, abdominal distention and anal bleeding.  Genitourinary: Negative for dysuria, hematuria, flank pain and difficulty urinating.  Musculoskeletal: Positive for myalgias and joint swelling. Negative for back pain, arthralgias and gait problem.  Skin: Negative for color change, pallor, rash and wound.  Neurological: Negative for dizziness, tremors, weakness and light-headedness.  Hematological: Negative for adenopathy. Does not bruise/bleed easily.  Psychiatric/Behavioral: Negative for behavioral problems, confusion, sleep disturbance, dysphoric mood, decreased concentration and agitation.       Objective:   Physical Exam  Constitutional: He is oriented to person, place, and time. He appears well-developed and well-nourished. No distress.  HENT:  Head: Normocephalic and atraumatic.  Mouth/Throat: Oropharynx is clear and moist. No  oropharyngeal exudate.  Eyes: Conjunctivae and EOM are normal. Pupils are equal, round, and reactive to light. No scleral icterus.  Neck: Normal range of motion. Neck supple. No JVD present.  Cardiovascular: Normal rate, regular rhythm and normal heart sounds.  Exam reveals no gallop and no friction rub.   No murmur heard. Pulmonary/Chest: Effort normal and breath sounds normal. No respiratory distress. He has no wheezes. He has no rales. He exhibits no tenderness.  Abdominal: He exhibits no distension and no mass. There is no tenderness. There is no rebound and no guarding.  Musculoskeletal: He exhibits no edema and no tenderness.       Right knee: He exhibits normal range of motion, no swelling, no effusion, no ecchymosis, no deformity, no laceration, no erythema, normal alignment, no LCL laxity, no bony tenderness, normal meniscus and no MCL laxity. tenderness found. Lateral joint line tenderness noted. No MCL, no LCL and no patellar tendon tenderness noted.  Lymphadenopathy:    He has no cervical adenopathy.  Neurological: He is alert and oriented to person, place, and time. He has normal reflexes. He exhibits normal muscle tone. Coordination normal.  Skin: Skin is warm and dry. He is not diaphoretic. No erythema. No pallor.  Psychiatric: He has a normal mood and affect. His behavior is normal. Judgment and thought content normal.          Assessment & Plan:  HIV DISEASE Superb control!  Right knee pain Refer to sports medicine. I cannot find anything clear cut causing these symptoms. Could consider MRI L spine. 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 WIll give him vicodin #6o for the time being  ESSENTIAL HYPERTENSION, BENIGN Reasonable contrtol

## 2011-09-05 ENCOUNTER — Other Ambulatory Visit: Payer: Self-pay | Admitting: Infectious Disease

## 2011-09-05 DIAGNOSIS — M791 Myalgia, unspecified site: Secondary | ICD-10-CM

## 2011-09-05 NOTE — Progress Notes (Signed)
[place], Mr Scales needs redraw on his CPK which was high at last visit

## 2011-09-06 ENCOUNTER — Ambulatory Visit: Payer: Self-pay

## 2011-09-14 ENCOUNTER — Ambulatory Visit: Payer: Self-pay

## 2011-09-14 ENCOUNTER — Telehealth: Payer: Self-pay

## 2011-09-14 NOTE — Telephone Encounter (Signed)
Patient came in today to renew [facility] card - he did not understand why I figured his income - advised with orange card had to count income in 2012 even though he lost job - his one job ended in August 2012, also have to count the new job which started in Nov 2012 and project til over the year - advised this is requirement - he has was approved for [employer], Juanell Fairly and for 95% discount on [facility]. He said I "was talking down to him" and ever since I came to do "our paperwork" he has had to bring so much and work so hard and never had to do that before. I did say the requirements have changed and it is getting more difficult to receive any financial assistance as the funds are not there like they used to be, plus I do have to follow the rules as best I can. Also, reiterated to him I would never intentionally "talk down" to someone and apologized that it came across that way. He said let's just get this over with and the papers signed and not talk about it anymore" - I said that was fine. Next time will involve a manager if it happens again.

## 2011-09-21 ENCOUNTER — Encounter (HOSPITAL_COMMUNITY): Payer: Self-pay

## 2011-09-21 ENCOUNTER — Emergency Department (HOSPITAL_COMMUNITY)
Admission: EM | Admit: 2011-09-21 | Discharge: 2011-09-21 | Disposition: A | Discharge status: Home / Self Care | Payer: Self-pay | Attending: Emergency Medicine | Admitting: Emergency Medicine

## 2011-09-21 DIAGNOSIS — L0291 Cutaneous abscess, unspecified: Secondary | ICD-10-CM

## 2011-09-21 DIAGNOSIS — L0501 Pilonidal cyst with abscess: Secondary | ICD-10-CM | POA: Insufficient documentation

## 2011-09-21 DIAGNOSIS — I10 Essential (primary) hypertension: Secondary | ICD-10-CM | POA: Insufficient documentation

## 2011-09-21 DIAGNOSIS — F17200 Nicotine dependence, unspecified, uncomplicated: Secondary | ICD-10-CM | POA: Insufficient documentation

## 2011-09-21 DIAGNOSIS — Z21 Asymptomatic human immunodeficiency virus [HIV] infection status: Secondary | ICD-10-CM | POA: Insufficient documentation

## 2011-09-21 LAB — DIFFERENTIAL
Basophils Absolute: 0 10*3/uL (ref 0.0–0.1)
Basophils Relative: 1 % (ref 0–1)
Lymphocytes Relative: 40 % (ref 12–46)
Monocytes Absolute: 0.4 10*3/uL (ref 0.1–1.0)
Neutro Abs: 3.2 10*3/uL (ref 1.7–7.7)
Neutrophils Relative %: 49 % (ref 43–77)

## 2011-09-21 LAB — CBC
HCT: 41.4 % (ref 39.0–52.0)
MCHC: 35 g/dL (ref 30.0–36.0)
Platelets: 169 10*3/uL (ref 150–400)
RDW: 13.3 % (ref 11.5–15.5)
WBC: 6.5 10*3/uL (ref 4.0–10.5)

## 2011-09-21 LAB — POCT I-STAT, CHEM 8
BUN: 8 mg/dL (ref 6–23)
Calcium, Ion: 1.16 mmol/L (ref 1.12–1.32)
Chloride: 107 mEq/L (ref 96–112)
HCT: 43 % (ref 39.0–52.0)
Potassium: 4.4 mEq/L (ref 3.5–5.1)
Sodium: 141 mEq/L (ref 135–145)

## 2011-09-21 MED ORDER — CLINDAMYCIN PHOSPHATE 600 MG/50ML IV SOLN
600.0000 mg | Freq: Once | INTRAVENOUS | Status: AC
  Administered 2011-09-21: 600 mg via INTRAVENOUS
  Filled 2011-09-21: qty 50

## 2011-09-21 MED ORDER — SODIUM CHLORIDE 0.9 % IV SOLN
INTRAVENOUS | Status: DC
  Administered 2011-09-21: 18:00:00 via INTRAVENOUS

## 2011-09-21 MED ORDER — HYDROMORPHONE HCL PF 1 MG/ML IJ SOLN
1.0000 mg | Freq: Once | INTRAMUSCULAR | Status: AC
  Administered 2011-09-21: 1 mg via INTRAVENOUS
  Filled 2011-09-21: qty 1

## 2011-09-21 MED ORDER — CLINDAMYCIN HCL 150 MG PO CAPS: 150.0000 mg | ORAL_CAPSULE | Freq: Three times a day (TID) | ORAL | Status: AC

## 2011-09-21 MED ORDER — OXYCODONE-ACETAMINOPHEN 5-325 MG PO TABS: 2.0000 | ORAL_TABLET | ORAL | Status: AC | PRN

## 2011-09-21 MED ORDER — SODIUM CHLORIDE 0.9 % IV BOLUS (SEPSIS)
500.0000 mL | Freq: Once | INTRAVENOUS | Status: AC
  Administered 2011-09-21: 1000 mL via INTRAVENOUS

## 2011-09-21 NOTE — ED Provider Notes (Signed)
History     CSN: [id number]  Arrival date & time 09/21/11  1522   First MD Initiated Contact with Patient 09/21/11 1634      Chief Complaint  Patient presents with  . Abscess    (Consider location/radiation/quality/duration/timing/severity/associated sxs/prior treatment) Patient is a 41 y.o. male presenting with abscess. The history is provided by the patient. No language interpreter was used.  Abscess  This is a recurrent problem. The current episode started more than one week ago. The onset was gradual. The problem occurs frequently. The problem has been gradually worsening. The abscess is present on the left buttock. The problem is moderate. The abscess is characterized by itchiness, painfulness, swelling and redness. Pertinent negatives include no fever and no vomiting. Recently, medical care has been given at this facility. Services received include medications given.   Recurrent   Abscess to the L buttocks  with positive HIV patient. States it is even had surgery on this area before. Abscess today is around 4-6 cm tender palpable area with fluctuance. Past Medical History  Diagnosis Date  . Hypertension   . HIV (human immunodeficiency virus infection)     History reviewed. No pertinent past surgical history.  History reviewed. No pertinent family history.  History  Substance Use Topics  . Smoking status: Current Everyday Smoker -- 0.3 packs/day for 23 years    Types: Cigarettes  . Smokeless tobacco: Never Used  . Alcohol Use: No      Review of Systems  Constitutional: Negative for fever.  Gastrointestinal: Negative for vomiting.  All other systems reviewed and are negative.    Allergies  Ibuprofen  Home Medications   Current Outpatient Rx  Name Route Sig Dispense Refill  . EFAVIRENZ-EMTRICITAB-TENOFOVIR 600-200-300 MG PO TABS Oral Take 1 tablet by mouth at bedtime. 30 tablet 6    BP 141/100  Pulse 84  Temp(Src) 98.1 F (36.7 C) (Oral)  Resp 18  Ht  5\' 5"  (1.651 m)  Wt 216 lb (97.977 kg)  BMI 35.94 kg/m2  SpO2 97%  Physical Exam  Nursing note and vitals reviewed. Constitutional: He is oriented to person, place, and time. He appears well-developed and well-nourished.  HENT:  Head: Normocephalic and atraumatic.  Eyes: Pupils are equal, round, and reactive to light.  Neck: Neck supple.  Cardiovascular: Normal rate and regular rhythm.  Exam reveals no gallop and no friction rub.   No murmur heard. Pulmonary/Chest: Breath sounds normal. No respiratory distress.  Abdominal: Soft. He exhibits no distension.  Musculoskeletal: Normal range of motion.  Neurological: He is alert and oriented to person, place, and time. No cranial nerve deficit.  Skin: Skin is warm and dry.       4-6 cm abscess to L buttocks.  Psychiatric: He has a normal mood and affect.    ED Course  INCISION AND DRAINAGE Date/Time: 09/21/2011 5:25 AM Performed by: Jethro Bastos Authorized by: Jethro Bastos Consent: Verbal consent obtained. Consent given by: patient Patient understanding: patient states understanding of the procedure being performed Procedure consent: procedure consent matches procedure scheduled Patient identity confirmed: verbally with patient, arm band, provided demographic data and hospital-assigned identification number Type: pilonidal cyst Location: buttocks. Anesthesia: local infiltration Local anesthetic: lidocaine 2% without epinephrine Anesthetic total: 10 ml Patient sedated: no Scalpel size: 11 Needle gauge: 22 Complexity: simple Drainage: serous Drainage amount: scant Wound treatment: wound left open Packing material: 1/4 in iodoform gauze Patient tolerance: Patient tolerated the procedure well with no immediate complications.   (  including critical care time)   Labs Reviewed  CBC  DIFFERENTIAL  POCT I-STAT, CHEM 8   No results found.   No diagnosis found.    MDM  1730  Report received from Dr. Rubin Payor who  is moving his HIV positive patient to CDU for an pilonidal abscess drainage and packing. PMH of multiple drainages in this area. Went to urgent care last month and was put on antibiotics but it was not drained. Patient was compliant with antibiotics. The abscess improved but did not resolve.  When he was in prison he had to have surgery for this abscess in the past. 6:07 PM   4 cm abscess to the upper left buttocks drained with minimal purulent drainage packed with a minimal packing.  Patient will be referred to a surgeon or if he cannot get into surgery and he will return in 2 days for a recheck. We will put him on clindamycin.. No fever. He received a dose of clindamycin 600mg  here in the ER today.         Jethro Bastos, NP 09/22/11 1124  Jethro Bastos, NP 09/22/11 [phone]

## 2011-09-21 NOTE — ED Notes (Signed)
Pt. Has an abscess

## 2011-09-21 NOTE — Discharge Instructions (Signed)
Mr. last name you have any abscess severe left buttocks. We drained it today and will start you on antibiotics. He had antibiotics in the ER or see her IV today. Will also give me pain medicine the do not drive with narcotic pain medicine. He can also take ibuprofen 800 mg by mouth every 6 hours was started.  Use hot compresses on your abscess 3 times a day and squeeze. Packing may follow out before the return for recheck in 2 days.   Abscess An abscess (boil or furuncle) is an infected area under your skin. This area is filled with yellowish white fluid (pus). HOME CARE   Only take medicine as told by your doctor.   Keep the skin clean around your abscess. Keep clothes that may touch the abscess clean.   Change any bandages (dressings) as told by your doctor.   Avoid direct skin contact with other people. The infection can spread by skin contact with others.   Practice good hygiene and do not share personal care items.   Do not share athletic equipment, towels, or whirlpools. Shower after every practice or work out session.   If a draining area cannot be covered:   Do not play sports.   Children should not go to daycare until the wound has healed or until fluid (drainage) stops coming out of the wound.   See your doctor for a follow-up visit as told.  GET HELP RIGHT AWAY IF:   There is more pain, puffiness (swelling), and redness in the wound site.   There is fluid or bleeding from the wound site.   You have muscle aches, chills, fever, or feel sick.   You or your child has a temperature by mouth above 102 F (38.9 C), not controlled by medicine.   Your baby is older than 3 months with a rectal temperature of 102 F (38.9 C) or higher.  MAKE SURE YOU:   Understand these instructions.   Will watch your condition.   Will get help right away if you are not doing well or get worse.  Document Released: 01/02/2008 Document Revised: 03/28/2011 Document Reviewed:  01/02/2008 ExitCare Patient Information 2012 ExitCare, LLCCellulitis Cellulitis is an infection of the tissue under the skin. The infected area is usually red and tender. This is caused by germs. These germs enter the body through cuts or sores. This usually happens in the arms or lower legs. HOME CARE   Take your medicine as told. Finish it even if you start to feel better.   If the infection is on the arm or leg, keep it raised (elevated).   Use a warm cloth on the infected area several times a day.   See your doctor for a follow-up visit as told.  GET HELP RIGHT AWAY IF:   You are tired or confused.   You throw up (vomit).   You have watery poop (diarrhea).   You feel ill and have muscle aches.   You have a fever.  MAKE SURE YOU:   Understand these instructions.   Will watch your condition.   Will get help right away if you are not doing well or get worse.  Document Released: 01/02/2008 Document Revised: 03/28/2011 Document Reviewed: 06/17/2009 [facility] Patient Information 2012 [place], [place].Marland Kitchen

## 2011-09-21 NOTE — ED Notes (Signed)
Abscess to buttocks, developed 4 weeks ago went to urgent care took antibiotics, but ineffective.

## 2011-09-21 NOTE — ED Provider Notes (Signed)
History     CSN: [id number]  Arrival date & time 09/21/11  1522   First MD Initiated Contact with Patient 09/21/11 1634      Chief Complaint  Patient presents with  . Abscess    (Consider location/radiation/quality/duration/timing/severity/associated sxs/prior treatment) Patient is a 41 y.o. male presenting with abscess. The history is provided by the patient.  Abscess  Associated symptoms include a fever. Pertinent negatives include no diarrhea and no vomiting.   patient states he's had an abscess to the buttocks for the last few weeks. He states he gets seen in urgent care and given antibiotics. On chart review it appears to be Keflex. He states his continued to get bigger and is having increased pain at. He states he is having fevers and chills. Has not drained. It is worse with palpation. He states he's had abscesses previously. He's used warm soaks without effect.  Past Medical History  Diagnosis Date  . Hypertension   . HIV (human immunodeficiency virus infection)     History reviewed. No pertinent past surgical history.  History reviewed. No pertinent family history.  History  Substance Use Topics  . Smoking status: Current Everyday Smoker -- 0.3 packs/day for 23 years    Types: Cigarettes  . Smokeless tobacco: Never Used  . Alcohol Use: No      Review of Systems  Constitutional: Positive for fever and chills. Negative for fatigue.  Respiratory: Negative for chest tightness and shortness of breath.   Cardiovascular: Negative for chest pain.  Gastrointestinal: Negative for nausea, vomiting and diarrhea.  Musculoskeletal: Negative for back pain.  Skin: Positive for wound.  Neurological: Negative for seizures and syncope.  Psychiatric/Behavioral: Negative for confusion.    Allergies  Ibuprofen  Home Medications   Current Outpatient Rx  Name Route Sig Dispense Refill  . EFAVIRENZ-EMTRICITAB-TENOFOVIR 600-200-300 MG PO TABS Oral Take 1 tablet by mouth at  bedtime. 30 tablet 6    BP 141/100  Pulse 84  Temp(Src) 98.1 F (36.7 C) (Oral)  Resp 18  Ht 5\' 5"  (1.651 m)  Wt 216 lb (97.977 kg)  BMI 35.94 kg/m2  SpO2 97%  Physical Exam  Nursing note and vitals reviewed. Constitutional: He is oriented to person, place, and time. He appears well-developed and well-nourished.  HENT:  Head: Normocephalic and atraumatic.  Eyes: EOM are normal. Pupils are equal, round, and reactive to light.  Neck: Normal range of motion. Neck supple.  Cardiovascular: Normal rate, regular rhythm and normal heart sounds.   No murmur heard. Pulmonary/Chest: Effort normal and breath sounds normal.  Abdominal: Soft. Bowel sounds are normal. He exhibits no distension and no mass. There is no tenderness. There is no rebound and no guarding.  Musculoskeletal: Normal range of motion. He exhibits no edema.  Neurological: He is alert and oriented to person, place, and time. No cranial nerve deficit.  Skin: Skin is warm and dry.       Tender indurated area at the superior gluteal full. On the right aspect is approximately 1 cm fluctuant area. The total duration is approximately 5 cm. No active drainage  Psychiatric: He has a normal mood and affect.    ED Course  Procedures (including critical care time)   Labs Reviewed  CBC  DIFFERENTIAL   No results found.   No diagnosis found.    MDM  Likely pilonidal abscess. He has been on antibiotics. We will attempt incision and drainage and IV antibiotics. I am lab work in what we get  with the drainage, admission versus discharge with different antibiotics       Juliet Rude. Rubin Payor, MD 09/21/11 [phone]

## 2011-09-21 NOTE — ED Notes (Deleted)
Chest pain began a one  hour ago center of the chest ,  Sob present and dizziness,

## 2011-09-21 NOTE — ED Notes (Signed)
Pt c/o abscess at coccyx area x's 3 weeks.  St's no drainage, very painful;

## 2011-09-22 NOTE — ED Provider Notes (Signed)
Medical screening examination/treatment/procedure(s) were performed by non-physician practitioner and as supervising physician I was immediately available for consultation/collaboration.  Clarance Bollard R. Brittony Billick, MD 09/22/11 1613 

## 2011-10-24 ENCOUNTER — Emergency Department (HOSPITAL_COMMUNITY): Payer: Self-pay

## 2011-10-24 ENCOUNTER — Emergency Department (HOSPITAL_COMMUNITY)
Admission: EM | Admit: 2011-10-24 | Discharge: 2011-10-24 | Disposition: A | Discharge status: Home / Self Care | Payer: Self-pay | Attending: Emergency Medicine | Admitting: Emergency Medicine

## 2011-10-24 ENCOUNTER — Encounter (HOSPITAL_COMMUNITY): Payer: Self-pay | Admitting: Emergency Medicine

## 2011-10-24 ENCOUNTER — Encounter (HOSPITAL_COMMUNITY): Payer: Self-pay

## 2011-10-24 DIAGNOSIS — J329 Chronic sinusitis, unspecified: Secondary | ICD-10-CM | POA: Insufficient documentation

## 2011-10-24 DIAGNOSIS — Z21 Asymptomatic human immunodeficiency virus [HIV] infection status: Secondary | ICD-10-CM | POA: Insufficient documentation

## 2011-10-24 DIAGNOSIS — I10 Essential (primary) hypertension: Secondary | ICD-10-CM | POA: Insufficient documentation

## 2011-10-24 DIAGNOSIS — R51 Headache: Secondary | ICD-10-CM | POA: Insufficient documentation

## 2011-10-24 DIAGNOSIS — J069 Acute upper respiratory infection, unspecified: Secondary | ICD-10-CM | POA: Insufficient documentation

## 2011-10-24 DIAGNOSIS — F17200 Nicotine dependence, unspecified, uncomplicated: Secondary | ICD-10-CM | POA: Insufficient documentation

## 2011-10-24 MED ORDER — ACETAMINOPHEN-CODEINE 120-12 MG/5ML PO SOLN
5.0000 mL | Freq: Once | ORAL | Status: DC
  Filled 2011-10-24: qty 10

## 2011-10-24 MED ORDER — AZITHROMYCIN 250 MG PO TABS: ORAL_TABLET | ORAL | Status: AC

## 2011-10-24 MED ORDER — ACETAMINOPHEN-CODEINE 120-12 MG/5ML PO SUSP: 5.0000 mL | Freq: Four times a day (QID) | ORAL | Status: AC | PRN

## 2011-10-24 MED ORDER — ONDANSETRON 4 MG PO TBDP
4.0000 mg | ORAL_TABLET | Freq: Once | ORAL | Status: AC
  Administered 2011-10-24: 4 mg via ORAL

## 2011-10-24 MED ORDER — AZITHROMYCIN 250 MG PO TABS
ORAL_TABLET | ORAL | Status: AC
  Filled 2011-10-24: qty 2

## 2011-10-24 MED ORDER — BENADRYL 25 MG PO TABS: 25.0000 mg | ORAL_TABLET | Freq: Four times a day (QID) | ORAL | Status: DC | PRN

## 2011-10-24 MED ORDER — ONDANSETRON HCL 4 MG PO TABS: 4.0000 mg | ORAL_TABLET | Freq: Four times a day (QID) | ORAL | Status: AC

## 2011-10-24 MED ORDER — AZITHROMYCIN 250 MG PO TABS
500.0000 mg | ORAL_TABLET | Freq: Once | ORAL | Status: AC
  Administered 2011-10-24: 500 mg via ORAL

## 2011-10-24 MED ORDER — ACETAMINOPHEN 500 MG PO TABS
ORAL_TABLET | ORAL | Status: AC
  Filled 2011-10-24: qty 2

## 2011-10-24 MED ORDER — ACETAMINOPHEN 500 MG PO TABS
1000.0000 mg | ORAL_TABLET | Freq: Once | ORAL | Status: AC
  Administered 2011-10-24: 1000 mg via ORAL

## 2011-10-24 MED ORDER — ONDANSETRON 4 MG PO TBDP
ORAL_TABLET | ORAL | Status: AC
  Filled 2011-10-24: qty 1

## 2011-10-24 MED ORDER — AZITHROMYCIN 250 MG PO TABS: 250.0000 mg | ORAL_TABLET | Freq: Every day | ORAL | Status: DC

## 2011-10-24 NOTE — ED Notes (Signed)
Pt left without taking discharge instructions.  

## 2011-10-24 NOTE — ED Notes (Signed)
Pt reports HA with productive cough.  States he has coughed up blood.  Denies n/v, alert and oriented x 4, ambulatory.

## 2011-10-24 NOTE — ED Provider Notes (Signed)
History     CSN: [id number]  Arrival date & time 10/24/11  0556   None     Chief Complaint  Patient presents with  . Cough  . Headache    (Consider location/radiation/quality/duration/timing/severity/associated sxs/prior treatment) Patient is a 41 y.o. male presenting with cough and headaches. The history is provided by the patient. No language interpreter was used.  Cough This is a new problem. The current episode started 12 to 24 hours ago. The problem occurs constantly. The problem has been gradually worsening. The cough is productive of sputum. There has been no fever. Associated symptoms include chills, ear congestion, headaches, rhinorrhea and sore throat. Pertinent negatives include no chest pain, no sweats, no ear pain, no shortness of breath and no wheezing. He has tried nothing for the symptoms. Risk factors include travel to endemic areas. He is a smoker. His past medical history does not include pneumonia or asthma.  Headache  Pertinent negatives include no fever, no shortness of breath, no nausea and no vomiting.  Patient reports 24 hours of nasal congestion, non-productive cough, sore throat and body aches and pains.  Coming from  where he was seen for the same.  Patient left the hospital without his rx or discharge papers saying they did nothing for him.  He was given a rx for z-pack and tylenol with codiene.  Patient is a smoker with pmh of hypertension and HIV.    Past Medical History  Diagnosis Date  . Hypertension   . HIV (human immunodeficiency virus infection)     History reviewed. No pertinent past surgical history.  History reviewed. No pertinent family history.  History  Substance Use Topics  . Smoking status: Current Everyday Smoker -- 0.3 packs/day for 23 years    Types: Cigarettes  . Smokeless tobacco: Never Used  . Alcohol Use: No      Review of Systems  Constitutional: Positive for chills. Negative for fever.  HENT: Positive for  congestion, sore throat, rhinorrhea and sinus pressure. Negative for ear pain and neck stiffness.   Respiratory: Positive for cough. Negative for shortness of breath and wheezing.   Cardiovascular: Negative for chest pain.  Gastrointestinal: Negative for nausea, vomiting, abdominal pain and diarrhea.  Musculoskeletal: Negative for back pain.  Skin: Negative for rash.  Neurological: Positive for headaches.  Psychiatric/Behavioral: Negative.     Allergies  Ibuprofen  Home Medications   Current Outpatient Rx  Name Route Sig Dispense Refill  . EFAVIRENZ-EMTRICITAB-TENOFOVIR 600-200-300 MG PO TABS Oral Take 1 tablet by mouth at bedtime. 30 tablet 6  . ACETAMINOPHEN-CODEINE 120-12 MG/5ML PO SUSP Oral Take 5 mLs by mouth every 6 (six) hours as needed for pain. 60 mL 0  . AZITHROMYCIN 250 MG PO TABS  Take as directed 6 each 0    BP 145/101  Pulse 97  Temp(Src) 98.4 F (36.9 C) (Oral)  Resp 18  SpO2 99%  Physical Exam  Nursing note and vitals reviewed. Constitutional: He is oriented to person, place, and time. He appears well-developed and well-nourished.  HENT:  Head: Normocephalic.  Right Ear: Hearing normal. No drainage. Tympanic membrane is injected and bulging.  Left Ear: Hearing normal. No drainage. Tympanic membrane is injected and bulging.  Nose: Mucosal edema and rhinorrhea present. No sinus tenderness.  Mouth/Throat: Uvula is midline, oropharynx is clear and moist and mucous membranes are normal.  Eyes: Conjunctivae and EOM are normal. Pupils are equal, round, and reactive to light.  Neck: Normal range of motion. Neck  supple.  Cardiovascular: Normal rate.   Pulmonary/Chest: Effort normal.  Abdominal: Soft.  Musculoskeletal: Normal range of motion.  Neurological: He is alert and oriented to person, place, and time.  Skin: Skin is warm and dry.  Psychiatric: He has a normal mood and affect.    ED Course  Procedures (including critical care time)  Labs Reviewed - No  data to display Dg Chest 2 View  10/24/2011  *RADIOLOGY REPORT*  Clinical Data: Cough, congestion, body aches and headache.  CHEST - 2 VIEW  Comparison: Chest radiograph performed 11/24/2010  Findings: The lungs are well-aerated and clear.  There is no evidence of focal opacification, pleural effusion or pneumothorax.  The heart is normal in size; the mediastinal contour is within normal limits.  No acute osseous abnormalities are seen.  IMPRESSION: No acute cardiopulmonary process seen.  Original Report Authenticated By: Tonia Ghent, M.D.     No diagnosis found.    MDM  Treated for URI with z-pack.  Started in the ER.  Tylenol for body aches.  Instructed to use the zofran for nausea, tylenol for body aches, benadryl for congestion and finish z-pack (first dose in ER).  Follow up at [facility].        Jethro Bastos, NP 10/24/11 [phone]

## 2011-10-24 NOTE — ED Notes (Signed)
CN into room to speak with pt (called by primary RN), pt unhappy with & disagrees with dx & tx. States, "should have gotten more for his wait". tx & dx not based on wait times explained. tx and dx explained with rationale. Pt beligerent. Choosing to leave w/o speaking with MD. Left choosing to leave Rx and instructions. Did have non-productive & beligerent words with EDP in h/w on the way out the door.

## 2011-10-24 NOTE — ED Notes (Signed)
Patient c/o progressive headache, stuffy nose, congested, productive cough, nausea, diarrhea, decreased appetite, generalized pain and chills since approx. Noon yesterday. Denies emesis. Patient recently d/c'd from [facility] with diagnosis of sinus infection, but states "does not think it is that".

## 2011-10-24 NOTE — ED Provider Notes (Signed)
Medical screening examination/treatment/procedure(s) were performed by non-physician practitioner and as supervising physician I was immediately available for consultation/collaboration.  According to physician at site at which pt was at previously, pt was treated for sx with zithromax - refused medicine and left without giving reasonable explanation - pt has been ambulatory, appears in no distress and is amenable to treatment consistent with prior visit.    Vida Roller, MD 10/24/11 [phone]

## 2011-10-24 NOTE — ED Notes (Signed)
Passed pt in hallway leaving, asked him if he wanted his dose of pain medication.  Pt stated he did not want it and was leaving, EDP was present.  EDP asked pt to stay for medication and re-informed of tx.  Pt stated he was going to another hospital because we did not do enough.

## 2011-10-24 NOTE — Discharge Instructions (Signed)

## 2011-10-24 NOTE — Discharge Instructions (Signed)
Drew Cuevas we gave you the first 2 doses of your z-pack in the ER today.  Starting tomorrow take 1 pill every day x 4 days.  Take tylenol for body aches and pains.  Take benadryl for nasal/earl congestion.  Follow up at the [facility] this week if not better.  Return to the ER for severe pain or uncontrolled nausea and vomiting.

## 2011-10-24 NOTE — ED Notes (Signed)
Pt was just treated and discharged from [facility], but wasn't happy with his diagnosis, left without notifying the staff and came here for further treatment

## 2011-10-24 NOTE — ED Notes (Signed)
PT. REPORTS HEADACHE WITH PRODUCTIVE COUGH , NASAL CONGESTION Drew Cuevas NOSE AND BODY ACHES ONSET YESTERDAY .

## 2011-10-24 NOTE — ED Notes (Signed)
Went to give pt d/c instructions, pt states that "we have not done anything for him".  Explained that Dr has seen the x-ray, assessed him, and prescribed pain medication and abx.  Pt not satisfied.  Contacted MD about dose of pain medication before discharge, orders placed.  Charge nurse notified and will come speak with pt.

## 2011-10-24 NOTE — ED Provider Notes (Signed)
History     CSN: [id number]  Arrival date & time 10/24/11  4098   First MD Initiated Contact with Patient 10/24/11 [phone]      Chief Complaint  Patient presents with  . Headache    (Consider location/radiation/quality/duration/timing/severity/associated sxs/prior treatment) HPI Sinus pressure with some cough and congestion. No fevers. No productive sputum. Patient is HIV positive and followed by outpatient clinics. No recent antibiotics. No rashes. No recent travel. There is associated mild headache, dull in quality and located all over. No neck stiffness. No chest pain or difficulty breathing. Moderate in severity. No known aggravating or alleviating factors. Patient has not taken any medications for this with onset of symptoms yesterday Past Medical History  Diagnosis Date  . Hypertension   . HIV (human immunodeficiency virus infection)     No past surgical history on file.  No family history on file.  History  Substance Use Topics  . Smoking status: Current Everyday Smoker -- 0.3 packs/day for 23 years    Types: Cigarettes  . Smokeless tobacco: Never Used  . Alcohol Use: No      Review of Systems  Constitutional: Negative for fever and chills.  HENT: Positive for congestion, sore throat, voice change and sinus pressure. Negative for drooling, trouble swallowing, neck pain and neck stiffness.   Eyes: Negative for pain.  Respiratory: Positive for cough. Negative for shortness of breath.   Cardiovascular: Negative for chest pain.  Gastrointestinal: Negative for abdominal pain.  Genitourinary: Negative for dysuria.  Musculoskeletal: Negative for back pain.  Skin: Negative for rash.  Neurological: Positive for headaches.  All other systems reviewed and are negative.    Allergies  Ibuprofen  Home Medications   Current Outpatient Rx  Name Route Sig Dispense Refill  . EFAVIRENZ-EMTRICITAB-TENOFOVIR 600-200-300 MG PO TABS Oral Take 1 tablet by mouth at bedtime. 30  tablet 6    BP 146/101  Pulse 102  Temp(Src) 98.3 F (36.8 C) (Oral)  Resp 24  SpO2 100%  Physical Exam  Constitutional: He is oriented to person, place, and time. He appears well-developed and well-nourished.  HENT:  Head: Normocephalic and atraumatic.  Mouth/Throat: No oropharyngeal exudate.       Nasal congestion  Eyes: Conjunctivae and EOM are normal. Pupils are equal, round, and reactive to light.  Neck: Trachea normal. Neck supple. No thyromegaly present.  Cardiovascular: Normal rate, regular rhythm, S1 normal, S2 normal and normal pulses.     No systolic murmur is present   No diastolic murmur is present  Pulses:      Radial pulses are 2+ on the right side, and 2+ on the left side.  Pulmonary/Chest: Effort normal and breath sounds normal. He has no wheezes. He has no rhonchi. He has no rales. He exhibits no tenderness.  Abdominal: Soft. Normal appearance and bowel sounds are normal. There is no tenderness. There is no CVA tenderness and negative Murphy's sign.  Musculoskeletal:       BLE:s Calves nontender, no cords or erythema, negative Homans sign  Neurological: He is alert and oriented to person, place, and time. He has normal strength and normal reflexes. No cranial nerve deficit or sensory deficit. He exhibits normal muscle tone. Coordination normal. GCS eye subscore is 4. GCS verbal subscore is 5. GCS motor subscore is 6.  Skin: Skin is warm and dry. No rash noted. He is not diaphoretic.  Psychiatric: His speech is normal.       Cooperative and appropriate    ED  Course  Procedures (including critical care time)  Labs Reviewed - No data to display Dg Chest 2 View  10/24/2011  *RADIOLOGY REPORT*  Clinical Data: Cough, congestion, body aches and headache.  CHEST - 2 VIEW  Comparison: Chest radiograph performed 11/24/2010  Findings: The lungs are well-aerated and clear.  There is no evidence of focal opacification, pleural effusion or pneumothorax.  The heart is normal  in size; the mediastinal contour is within normal limits.  No acute osseous abnormalities are seen.  IMPRESSION: No acute cardiopulmonary process seen.  Original Report Authenticated By: Tonia Ghent, M.D.   Patient tolerating by mouth fluids. Tylenol codeine provided for cough and headache. Chest x-ray obtained and reviewed as above.  MDM    Sinusitis with URI symptoms HIV positive male. Plan Z-Pak and outpatient followup.        Sunnie Nielsen, MD 10/24/11 [phone]

## 2011-10-25 ENCOUNTER — Encounter (HOSPITAL_COMMUNITY): Payer: Self-pay

## 2011-10-25 ENCOUNTER — Emergency Department (HOSPITAL_COMMUNITY)
Admission: EM | Admit: 2011-10-25 | Discharge: 2011-10-26 | Disposition: A | Discharge status: Home / Self Care | Payer: Self-pay | Attending: Emergency Medicine | Admitting: Emergency Medicine

## 2011-10-25 DIAGNOSIS — B9789 Other viral agents as the cause of diseases classified elsewhere: Secondary | ICD-10-CM | POA: Insufficient documentation

## 2011-10-25 DIAGNOSIS — J309 Allergic rhinitis, unspecified: Secondary | ICD-10-CM | POA: Insufficient documentation

## 2011-10-25 DIAGNOSIS — Z21 Asymptomatic human immunodeficiency virus [HIV] infection status: Secondary | ICD-10-CM | POA: Insufficient documentation

## 2011-10-25 DIAGNOSIS — I10 Essential (primary) hypertension: Secondary | ICD-10-CM | POA: Insufficient documentation

## 2011-10-25 DIAGNOSIS — F17200 Nicotine dependence, unspecified, uncomplicated: Secondary | ICD-10-CM | POA: Insufficient documentation

## 2011-10-25 DIAGNOSIS — Z886 Allergy status to analgesic agent status: Secondary | ICD-10-CM | POA: Insufficient documentation

## 2011-10-25 DIAGNOSIS — J069 Acute upper respiratory infection, unspecified: Secondary | ICD-10-CM | POA: Insufficient documentation

## 2011-10-25 DIAGNOSIS — J302 Other seasonal allergic rhinitis: Secondary | ICD-10-CM

## 2011-10-25 NOTE — ED Notes (Signed)
Pt complains of vomiting and diarrhea since Monday, was seen here earlier this week, pt states he is worse and now short of breath

## 2011-10-26 MED ORDER — PREDNISONE 10 MG PO TABS: 40.0000 mg | ORAL_TABLET | Freq: Every day | ORAL | Status: DC

## 2011-10-26 MED ORDER — ONDANSETRON HCL 4 MG/2ML IJ SOLN
4.0000 mg | Freq: Once | INTRAMUSCULAR | Status: AC
  Administered 2011-10-26: 4 mg via INTRAVENOUS
  Filled 2011-10-26: qty 2

## 2011-10-26 MED ORDER — LORATADINE 10 MG PO TABS: 10.0000 mg | ORAL_TABLET | Freq: Every day | ORAL | Status: DC

## 2011-10-26 MED ORDER — LORATADINE 10 MG PO TABS
10.0000 mg | ORAL_TABLET | Freq: Once | ORAL | Status: AC
  Administered 2011-10-26: 10 mg via ORAL
  Filled 2011-10-26 (×2): qty 1

## 2011-10-26 MED ORDER — SODIUM CHLORIDE 0.9 % IV BOLUS (SEPSIS)
1000.0000 mL | Freq: Once | INTRAVENOUS | Status: AC
  Administered 2011-10-26: 1000 mL via INTRAVENOUS

## 2011-10-26 MED ORDER — DEXAMETHASONE SODIUM PHOSPHATE 10 MG/ML IJ SOLN
10.0000 mg | Freq: Once | INTRAMUSCULAR | Status: AC
  Administered 2011-10-26: 10 mg via INTRAVENOUS
  Filled 2011-10-26: qty 1

## 2011-10-26 NOTE — ED Provider Notes (Signed)
History     CSN: [id number]  Arrival date & time 10/25/11  2324   First MD Initiated Contact with Patient 10/26/11 0008      Chief Complaint  Patient presents with  . Nausea and vomiting     (Consider location/radiation/quality/duration/timing/severity/associated sxs/prior treatment) HPI Comments: Patient is HIV positive male who presents with a week history of cough, nasal congestion, headache, sorethroat, fever, now with vomiting and diarrhea - states that he has been on antibiotics for the cough, but reports no improvement - states that he is continueing to smoke but does not believe this coorelates with his symptoms.  Reports nasal congestion, sneezing, watery eyes and mild sore throat as well.  States vomited x 1 today, denies fever, chills, abdominal pain.  Patient is a 41 y.o. male presenting with cough. The history is provided by the patient. No language interpreter was used.  Cough This is a new problem. The current episode started more than 1 week ago. The problem occurs constantly. The problem has not changed since onset.The cough is non-productive. There has been no fever. Associated symptoms include headaches, rhinorrhea, sore throat and myalgias. Pertinent negatives include no chest pain, no chills, no sweats, no weight loss, no ear congestion, no ear pain, no shortness of breath, no wheezing and no eye redness. He has tried cough syrup for the symptoms. The treatment provided no relief. He is a smoker. His past medical history does not include pneumonia.    Past Medical History  Diagnosis Date  . Hypertension   . HIV (human immunodeficiency virus infection)     History reviewed. No pertinent past surgical history.  History reviewed. No pertinent family history.  History  Substance Use Topics  . Smoking status: Current Everyday Smoker -- 0.3 packs/day for 23 years    Types: Cigarettes  . Smokeless tobacco: Never Used  . Alcohol Use: No      Review of Systems    Constitutional: Negative for chills and weight loss.  HENT: Positive for sore throat, rhinorrhea and sneezing. Negative for ear pain.   Eyes: Positive for itching. Negative for redness.  Respiratory: Positive for cough. Negative for shortness of breath and wheezing.   Cardiovascular: Negative for chest pain.  Musculoskeletal: Positive for myalgias.  Neurological: Positive for headaches.  All other systems reviewed and are negative.    Allergies  Ibuprofen  Home Medications   Current Outpatient Rx  Name Route Sig Dispense Refill  . ACETAMINOPHEN-CODEINE 120-12 MG/5ML PO SUSP Oral Take 5 mLs by mouth every 6 (six) hours as needed for pain. 60 mL 0  . AZITHROMYCIN 250 MG PO TABS  Take as directed 6 each 0  . BENADRYL 25 MG PO TABS Oral Take 1 tablet (25 mg total) by mouth every 6 (six) hours as needed for itching. 20 tablet 0    Dispense as written.  . EFAVIRENZ-EMTRICITAB-TENOFOVIR 600-200-300 MG PO TABS Oral Take 1 tablet by mouth at bedtime. 30 tablet 6  . ONDANSETRON HCL 4 MG PO TABS Oral Take 1 tablet (4 mg total) by mouth every 6 (six) hours. 12 tablet 0    BP 148/96  Pulse 110  Temp(Src) 99 F (37.2 C) (Oral)  Resp 18  SpO2 98%  Physical Exam  Nursing note and vitals reviewed. Constitutional: He is oriented to person, place, and time. He appears well-developed and well-nourished. No distress.  HENT:  Head: Normocephalic and atraumatic.  Right Ear: External ear normal.  Left Ear: External ear normal.  Mouth/Throat:  Oropharynx is clear and moist.       Mild pharyngeal erythema - boggy nasal mucosa.  Eyes: Conjunctivae are normal. Pupils are equal, round, and reactive to light. No scleral icterus.  Neck: Normal range of motion. Neck supple.  Cardiovascular: Regular rhythm and normal heart sounds.  Exam reveals no gallop and no friction rub.   No murmur heard.      tachycardia  Pulmonary/Chest: Effort normal and breath sounds normal. No respiratory distress. He has no  wheezes. He has no rales. He exhibits no tenderness.  Abdominal: Soft. Bowel sounds are normal. He exhibits no distension. There is no tenderness.  Musculoskeletal: Normal range of motion. He exhibits no edema and no tenderness.  Lymphadenopathy:    He has no cervical adenopathy.  Neurological: He is alert and oriented to person, place, and time. No cranial nerve deficit.  Skin: Skin is warm and dry. No rash noted. No erythema. No pallor.  Psychiatric: He has a normal mood and affect. His behavior is normal. Judgment and thought content normal.    ED Course  Procedures (including critical care time)  Labs Reviewed - No data to display No results found.   Seasonal allergies Viral URI    MDM  Patient with a history of HIV with nondetetable viral load and normal CD4 count present with continued symptoms now with one episode of vomiting and diarrhea today.  Given his symptoms which include allergy type symptoms, I do not feel the need to repeat the chest x-ray - plan to give patient fluids, steroids, and anti-histamines.  Patient to increase his fluid intake.  Reports feeling much better after interventions here.        Izola Price [place], [place] 10/26/11 [phone]

## 2011-10-26 NOTE — ED Provider Notes (Signed)
Medical screening examination/treatment/procedure(s) were performed by non-physician practitioner and as supervising physician I was immediately available for consultation/collaboration.   Hanley Seamen, MD 10/26/11 [phone]

## 2011-10-26 NOTE — Discharge Instructions (Signed)
Allergic Rhinitis  Allergic rhinitis is when the mucous membranes in the nose respond to allergens. Allergens are particles in the air that cause your body to have an allergic reaction. This causes you to release allergic antibodies. Through a chain of events, these eventually cause you to release histamine into the blood stream (hence the use of antihistamines). Although meant to be protective to the body, it is this release that causes your discomfort, such as frequent sneezing, congestion and an itchy runny nose.    CAUSES    The pollen allergens may come from grasses, trees, and weeds. This is seasonal allergic rhinitis, or "hay fever." Other allergens cause year-round allergic rhinitis (perennial allergic rhinitis) such as house dust mite allergen, pet dander and mold spores.    SYMPTOMS     Nasal stuffiness (congestion).    Runny, itchy nose with sneezing and tearing of the eyes.    There is often an itching of the mouth, eyes and ears.   It cannot be cured, but it can be controlled with medications.  DIAGNOSIS    If you are unable to determine the offending allergen, skin or blood testing may find it.  TREATMENT     Avoid the allergen.    Medications and allergy shots (immunotherapy) can help.    Hay fever may often be treated with antihistamines in pill or nasal spray forms. Antihistamines block the effects of histamine. There are over-the-counter medicines that may help with nasal congestion and swelling around the eyes. Check with your caregiver before taking or giving this medicine.   If the treatment above does not work, there are many new medications your caregiver can prescribe. Stronger medications may be used if initial measures are ineffective. Desensitizing injections can be used if medications and avoidance fails. Desensitization is when a patient is given ongoing shots until the body becomes less sensitive to the allergen. Make sure you follow up with your caregiver if problems continue.   SEEK MEDICAL CARE IF:     You develop fever (more than 100.5 F (38.1 C).    You develop a cough that does not stop easily (persistent).    You have shortness of breath.    You start wheezing.    Symptoms interfere with normal daily activities.   Document Released: 04/10/2001 Document Revised: 07/05/2011 Document Reviewed: 10/20/2008  [employer] Patient Information 2012 [employer].    Cough, Adult   A cough is a reflex that helps clear your throat and airways. It can help heal the body or may be a reaction to an irritated airway. A cough may only last 2 or 3 weeks (acute) or may last more than 8 weeks (chronic).    CAUSES  Acute cough:   Viral or bacterial infections.   Chronic cough:   Infections.    Allergies.    Asthma.    Post-nasal drip.    Smoking.    Heartburn or acid reflux.    Some medicines.    Chronic lung problems (COPD).    Cancer.   SYMPTOMS    Cough.    Fever.    Chest pain.    Increased breathing rate.    High-pitched whistling sound when breathing (wheezing).    Colored mucus that you cough up (sputum).   TREATMENT     A bacterial cough may be treated with antibiotic medicine.    A viral cough must run its course and will not respond to antibiotics.    Your caregiver may   in a semi-upright position if your cough is worse at night.   Rest as needed.   Stop smoking if you smoke.  SEEK IMMEDIATE MEDICAL CARE IF:   You have pus in your sputum.   Your cough starts to worsen.   You cannot control your cough with suppressants and are losing sleep.   You begin coughing up blood.   You have difficulty breathing.   You  develop pain which is getting worse or is uncontrolled with medicine.   You have a fever.  MAKE SURE YOU:   Understand these instructions.   Will watch your condition.   Will get help right away if you are not doing well or get worse.  Document Released: 01/12/2011 Document Revised: 07/05/2011 Document Reviewed: 01/12/2011 [facility] Patient Information 2012 [place], [place].Headache and Allergies The relationship between allergies and headaches is unclear. Many people with allergic or infectious nasal problems also have headaches (migraines or sinus headaches). However, sometimes allergies can cause pressure that feels like a headache, and sometimes headaches can cause allergy-like symptoms. It is not always clear whether your symptoms are caused by allergies or by a headache. CAUSES   Migraine: The cause of a migraine is not always known.   Sinus Headache: The cause of a sinus headache may be a sinus infection. Other conditions that may be related to sinus headaches include:   Hay fever (allergic rhinitis).   Deviation of the nasal septum.   Swelling or clogging of the nasal passages.  SYMPTOMS  Migraine headache symptoms (which often last 4 to 72 hours) include:  Intense, throbbing pain on one or both sides of the head.   Nausea.   Vomiting.   Being extra sensitive to light.   Being extra sensitive to sound.   Nervous system reactions that appear similar to an allergic reaction:   Stuffy nose.   Runny nose.   Tearing.  Sinus headaches are felt as facial pain or pressure.  DIAGNOSIS  Because there is some overlap in symptoms, sinus and migraine headaches are often misdiagnosed. For example, a person with migraines may also feel facial pressure. Likewise, many people with hay fever may get migraine headaches rather than sinus headaches. These migraines can be triggered by the histamine release during an allergic reaction. An antihistamine medicine can eliminate this  pain. There are standard criteria that help clarify the difference between these headaches and related allergy or allergy-like symptoms. Your caregiver can use these criteria to determine the proper diagnosis and provide you the best care. TREATMENT  Migraine medicine may help people who have persistent migraine headaches even though their hay fever is controlled. For some people, anti-inflammatory treatments do not work to relieve migraines. Medicines called triptans (such as sumatriptan) can be helpful for those people. Document Released: 10/06/2003 Document Revised: 07/05/2011 Document Reviewed: 10/28/2009 [facility] Patient Information 2012 [place], [place].

## 2011-11-06 ENCOUNTER — Ambulatory Visit: Payer: Self-pay | Admitting: Internal Medicine

## 2011-11-07 ENCOUNTER — Encounter: Payer: Self-pay | Admitting: Internal Medicine

## 2011-11-07 ENCOUNTER — Ambulatory Visit (INDEPENDENT_AMBULATORY_CARE_PROVIDER_SITE_OTHER): Payer: Self-pay | Admitting: Internal Medicine

## 2011-11-07 VITALS — BP 139/92 | HR 108 | Temp 98.7°F | Wt 223.0 lb

## 2011-11-07 DIAGNOSIS — B20 Human immunodeficiency virus [HIV] disease: Secondary | ICD-10-CM

## 2011-11-07 DIAGNOSIS — R197 Diarrhea, unspecified: Secondary | ICD-10-CM

## 2011-11-07 NOTE — Progress Notes (Signed)
Patient ID: Drew Cuevas, male   DOB: 1971/07/21, 41 y.o.   MRN: [id number]  INFECTIOUS DISEASE PROGRESS NOTE    Subjective: Drew Cuevas is in for a work in visit. He was in the emergency department with a right arm abscess in February. He was treated with antibiotics and it resolved. However recently he has developed some intermittent nausea and diarrhea. He is also been bothered by itchy, watery eyes and sinus congestion for the last 3 weeks. He has had similar difficulties in the late spring over the past few years. He has been using over-the-counter Claritin with only moderate relief. He has not had any fever or, chills or sweats. He was seen in the emergency department in late March and was treated with prednisone, again with only moderate relief of his symptoms.  About 3 weeks ago he became concerned about weight gain and started to restrict his calories and has been watching his salt intake. He states that he also has a desire to quit smoking. The only time he has never quit previously was when he was in prison. He currently has no plan of how he might quit.  He denies missing any doses of his Atripla  Objective: Temp: 98.7 F (37.1 C) (04/10 1541) Temp src: Oral (04/10 1541) BP: 139/92 mmHg (04/10 1541) Pulse Rate: 108  (04/10 1541)  General: He is in good spirits Skin: No rash Eyes: Clear Lungs: Clear Cor: Regular S1 and S2 no murmurs Abdomen: Soft and nontender   Lab Results HIV 1 RNA Quant (copies/mL)  Date Value  06/12/2011 NOT DETECTED   03/01/2011 <20   11/09/2010 <20      CD4 T Cell Abs (cmm)  Date Value  06/12/2011 740   03/01/2011 610   11/09/2010 450      Assessment: His HIV infection has been under very good control but I will need to repeat lab work today. I will continue Atripla for now.  I will check a C. difficile toxin PCR since he developed diarrhea after being on antibiotics.  I suspect that his sinus symptoms are due to seasonal allergies. I will have him  continue over-the-counter Claritin and use saline eye drops and saline nasal spray. Prescription steroid nasal sprays or out of his financial reach.  His blood pressure remains mildly elevated but I suggest lifestyle modification as a first step.  I talked to him about cigarette cessation planning given him the number for the [place] quit line.  Plan: 1. Continue Atripla 2. Check lab work today 3. Lifestyle modification 4. Cigarette cessation plan 5. Return to clinic in 3 months   Cliffton Asters, MD [facility] for Infectious Diseases [facility] [phone] pager   [phone] cell 11/07/2011, 4:11 PM

## 2011-11-08 LAB — COMPREHENSIVE METABOLIC PANEL
ALT: 33 U/L (ref 0–53)
Albumin: 4.3 g/dL (ref 3.5–5.2)
Alkaline Phosphatase: 94 U/L (ref 39–117)
CO2: 23 mEq/L (ref 19–32)
Glucose, Bld: 67 mg/dL — ABNORMAL LOW (ref 70–99)
Potassium: 4.1 mEq/L (ref 3.5–5.3)
Sodium: 139 mEq/L (ref 135–145)
Total Bilirubin: 0.3 mg/dL (ref 0.3–1.2)
Total Protein: 7 g/dL (ref 6.0–8.3)

## 2011-11-08 LAB — CBC
Hemoglobin: 15.6 g/dL (ref 13.0–17.0)
MCH: 33.1 pg (ref 26.0–34.0)
Platelets: 233 10*3/uL (ref 150–400)
RBC: 4.72 MIL/uL (ref 4.22–5.81)

## 2011-11-08 LAB — LIPID PANEL
Cholesterol: 229 mg/dL — ABNORMAL HIGH (ref 0–200)
LDL Cholesterol: 162 mg/dL — ABNORMAL HIGH (ref 0–99)
VLDL: 26 mg/dL (ref 0–40)

## 2011-11-08 LAB — T-HELPER CELL (CD4) - (RCID CLINIC ONLY)
CD4 % Helper T Cell: 27 % — ABNORMAL LOW (ref 33–55)
CD4 T Cell Abs: 720 uL (ref 400–2700)

## 2011-11-09 LAB — HIV-1 RNA QUANT-NO REFLEX-BLD
HIV 1 RNA Quant: <20 copies/mL (ref <20)
HIV-1 RNA Quant, Log: <1.3 {Log} (ref <1.30)

## 2011-12-04 ENCOUNTER — Telehealth: Payer: Self-pay | Admitting: *Deleted

## 2011-12-04 ENCOUNTER — Emergency Department (HOSPITAL_COMMUNITY): Payer: Self-pay

## 2011-12-04 ENCOUNTER — Encounter (HOSPITAL_COMMUNITY): Payer: Self-pay | Admitting: *Deleted

## 2011-12-04 ENCOUNTER — Emergency Department (HOSPITAL_COMMUNITY)
Admission: EM | Admit: 2011-12-04 | Discharge: 2011-12-04 | Disposition: A | Discharge status: Home / Self Care | Payer: Self-pay | Attending: Emergency Medicine | Admitting: Emergency Medicine

## 2011-12-04 DIAGNOSIS — J40 Bronchitis, not specified as acute or chronic: Secondary | ICD-10-CM | POA: Insufficient documentation

## 2011-12-04 DIAGNOSIS — Z21 Asymptomatic human immunodeficiency virus [HIV] infection status: Secondary | ICD-10-CM | POA: Insufficient documentation

## 2011-12-04 DIAGNOSIS — I10 Essential (primary) hypertension: Secondary | ICD-10-CM | POA: Insufficient documentation

## 2011-12-04 MED ORDER — AZITHROMYCIN 250 MG PO TABS: 250.0000 mg | ORAL_TABLET | Freq: Every day | ORAL | Status: AC

## 2011-12-04 MED ORDER — ALBUTEROL SULFATE HFA 108 (90 BASE) MCG/ACT IN AERS
2.0000 | INHALATION_SPRAY | RESPIRATORY_TRACT | Status: DC
  Administered 2011-12-04: 2 via RESPIRATORY_TRACT
  Filled 2011-12-04: qty 6.7

## 2011-12-04 NOTE — ED Provider Notes (Signed)
History     CSN: [id number]  Arrival date & time 12/04/11  1257   First MD Initiated Contact with Patient 12/04/11 [phone]      Chief Complaint  Patient presents with  . URI    (Consider location/radiation/quality/duration/timing/severity/associated sxs/prior treatment) The history is provided by the patient.   the patient reports to 3 days of nasal congestion cough and chest congestion.  He also reports mild shortness of breath.  Denies history of DVT or PE in the past.  He denies unilateral leg swelling.  He's had chills without documented fever.  He reports his cough is productive and is moderate in severity.  He has no cough suppressants at home.  He has had no recent sick contacts.  He has a history of HIV and is compliant with his medications.  He has no active chest pain.  He denies abdominal pain.  No nausea vomiting or diarrhea.  He denies melena.  He denies orthopnea or dyspnea on exertion  Past Medical History  Diagnosis Date  . Hypertension   . HIV (human immunodeficiency virus infection)     History reviewed. No pertinent past surgical history.  History reviewed. No pertinent family history.  History  Substance Use Topics  . Smoking status: Current Everyday Smoker -- 0.3 packs/day for 23 years    Types: Cigarettes  . Smokeless tobacco: Never Used  . Alcohol Use: No      Review of Systems  All other systems reviewed and are negative.    Allergies  Ibuprofen  Home Medications   Current Outpatient Rx  Name Route Sig Dispense Refill  . EFAVIRENZ-EMTRICITAB-TENOFOVIR 600-200-300 MG PO TABS Oral Take 1 tablet by mouth at bedtime. 30 tablet 6  . LORATADINE 10 MG PO TABS Oral Take 1 tablet (10 mg total) by mouth daily. 30 tablet 0  . AZITHROMYCIN 250 MG PO TABS Oral Take 1 tablet (250 mg total) by mouth daily. Take 2 tabs for first dose, then 1 tab for each additional dose 6 tablet 0    BP 136/93  Pulse 85  Temp(Src) 98.5 F (36.9 C) (Oral)  Resp 16  SpO2  99%  Physical Exam  Nursing note and vitals reviewed. Constitutional: He is oriented to person, place, and time. He appears well-developed and well-nourished.  HENT:  Head: Normocephalic and atraumatic.  Eyes: EOM are normal.  Neck: Normal range of motion.  Cardiovascular: Normal rate, regular rhythm, normal heart sounds and intact distal pulses.   Pulmonary/Chest: Effort normal and breath sounds normal. No respiratory distress.  Abdominal: Soft. He exhibits no distension. There is no tenderness.  Musculoskeletal: Normal range of motion.  Neurological: He is alert and oriented to person, place, and time.  Skin: Skin is warm and dry.  Psychiatric: He has a normal mood and affect. Judgment normal.    ED Course  Procedures (including critical care time)  Labs Reviewed - No data to display Dg Chest 2 View  12/04/2011  *RADIOLOGY REPORT*  Clinical Data: Cough, congestion, shortness of breath, loss of appetite, history smoking, hypertension, HIV  CHEST - 2 VIEW  Comparison: 10/24/2011  Findings: Upper-normal size of cardiac silhouette. Mediastinal contours and pulmonary vascularity normal. Peribronchial thickening increased since previous exam, particularly on right. No definite pulmonary infiltrate, pleural effusion or pneumothorax. No osseous findings.  IMPRESSION: Increased bronchitic changes since previous study.  Original Report Authenticated By: Lollie Marrow, M.D.     1. Bronchitis       MDM  This appears  to be a bronchitis.  His chest x-ray shows no pneumonia.  Albuterol inhaler now.  He does have a history of HIV and is on treatment for this.  He no longer smoking cigarettes secondary to his illness.  Home with azithromycin.  Albuterol for cough        Lyanne Co, MD 12/04/11 [phone]

## 2011-12-04 NOTE — Discharge Instructions (Signed)

## 2011-12-04 NOTE — Telephone Encounter (Signed)
Patient called and advised he is sick for 3-4 days now. He advised that he has a non-productive cough, his chest hurts, headaches and is having hot flashed. He has no fever a stuffy nose. Advised the patient we can get him in on next Tuesday to see the doctor if he wants to be seen. Advised the patient to take OTC mucinex for the cough and chest congestion, and Zyrtec for the drainage and he could also use some saline for his stuffy nose. Advised him to stay hydrated and to call us back if he develops a fever or he gets worse. He advised he will try these things and call us back if he does not get any better. He refused an appt at this time. Advised him again to call us if he gets worse.

## 2011-12-04 NOTE — ED Notes (Signed)
Pt in c/o cough, congestion and shortness of breath x2 days

## 2011-12-12 ENCOUNTER — Encounter (HOSPITAL_COMMUNITY): Payer: Self-pay | Admitting: *Deleted

## 2011-12-12 ENCOUNTER — Emergency Department (INDEPENDENT_AMBULATORY_CARE_PROVIDER_SITE_OTHER)
Admission: EM | Admit: 2011-12-12 | Discharge: 2011-12-12 | Disposition: A | Discharge status: Home / Self Care | Payer: Self-pay | Source: Home / Self Care | Attending: Family Medicine | Admitting: Family Medicine

## 2011-12-12 DIAGNOSIS — L309 Dermatitis, unspecified: Secondary | ICD-10-CM

## 2011-12-12 DIAGNOSIS — L259 Unspecified contact dermatitis, unspecified cause: Secondary | ICD-10-CM

## 2011-12-12 MED ORDER — HYDROCORTISONE ACE-PRAMOXINE 1-1 % RE CREA: TOPICAL_CREAM | Freq: Two times a day (BID) | RECTAL | Status: DC

## 2011-12-12 MED ORDER — HYDROXYZINE HCL 50 MG PO TABS: 50.0000 mg | ORAL_TABLET | Freq: Three times a day (TID) | ORAL | Status: DC | PRN

## 2011-12-12 MED ORDER — KETOCONAZOLE 2 % EX GEL: 1.0000 "application " | Freq: Every day | CUTANEOUS | Status: DC

## 2011-12-12 NOTE — ED Provider Notes (Signed)
History     CSN: [id number]  Arrival date & time 12/12/11  7673   First MD Initiated Contact with Patient 12/12/11 1934      Chief Complaint  Patient presents with  . Rash    (Consider location/radiation/quality/duration/timing/severity/associated sxs/prior treatment) HPI Comments: 41 year old smoker male with history of HIV virus infection and hypertension. Here complaining of rash and itchiness in sun exposed areas: face, neck and upper extremities for about 2 weeks. Patient reports that he had been taking sulfa antibiotics before the beginning of his rash. Rash is pruriginous. Denies fever or chills. Denies headache, body aches or malaise. No difficulty breathing or lesions inside the mouth. Denies h/o hepatitis. Patient has been on antiretrovirals for    Past Medical History  Diagnosis Date  . Hypertension   . HIV (human immunodeficiency virus infection)     History reviewed. No pertinent past surgical history.  No family history on file.  History  Substance Use Topics  . Smoking status: Current Everyday Smoker -- 0.3 packs/day for 23 years    Types: Cigarettes  . Smokeless tobacco: Never Used  . Alcohol Use: No      Review of Systems  Constitutional: Negative for fever, chills, activity change, appetite change and fatigue.  HENT: Positive for congestion and sneezing. Negative for sore throat, facial swelling, mouth sores and trouble swallowing.   Respiratory: Negative for cough and shortness of breath.   Gastrointestinal: Negative for nausea, abdominal pain and diarrhea.  Genitourinary: Negative for dysuria and genital sores.  Musculoskeletal: Negative for myalgias and arthralgias.  Skin: Positive for rash.  Neurological: Negative for dizziness and headaches.    Allergies  Ibuprofen  Home Medications   Current Outpatient Rx  Name Route Sig Dispense Refill  . EFAVIRENZ-EMTRICITAB-TENOFOVIR 600-200-300 MG PO TABS Oral Take 1 tablet by mouth at bedtime. 30  tablet 6  . HYDROXYZINE HCL 50 MG PO TABS Oral Take 1 tablet (50 mg total) by mouth every 8 (eight) hours as needed for itching. 20 tablet 0  . KETOCONAZOLE 2 % EX GEL Apply externally Apply 1 application topically daily. 15 g 0  . LORATADINE 10 MG PO TABS Oral Take 1 tablet (10 mg total) by mouth daily. 30 tablet 0  . HYDROCORTISONE ACE-PRAMOXINE 1-1 % RE CREA Rectal Place rectally 2 (two) times daily. 30 g 0    BP 182/98  Pulse 72  Temp(Src) 98.6 F (37 C) (Oral)  Resp 18  SpO2 100%  Physical Exam  Vitals reviewed. Constitutional: He is oriented to person, place, and time. He appears well-developed and well-nourished. No distress.  HENT:  Head: Normocephalic and atraumatic.  Eyes: Conjunctivae and EOM are normal. Pupils are equal, round, and reactive to light. No scleral icterus.  Neck: Neck supple.  Cardiovascular: Normal heart sounds.   Pulmonary/Chest: Breath sounds normal.  Abdominal: Soft. He exhibits no mass. There is no tenderness.  Lymphadenopathy:    He has no cervical adenopathy.  Neurological: He is alert and oriented to person, place, and time.  Skin: Rash noted.       Papular pruriginous rash scattered in upper extremities and face. Few excoriations associated . There is concomitant dry skin hyperpigmented non raised plaques in neck and upper back.   Psychiatric: He has a normal mood and affect. His behavior is normal.    ED Course  Procedures (including critical care time)  Labs Reviewed - No data to display No results found.   1. Dermatitis  MDM  Possible photodermatitis or drug related skin rash vs HIV related rash. There are few lesion in neck and back that could be related to tinea versicolor vs excema. Prescribed hydroxyzine, pramoxin/hydrocortisone and ketoconazol topical. Follow up with PCP if persistent or worsening despite treatment.        Sharin Grave, MD 12/13/11 1005

## 2011-12-12 NOTE — Discharge Instructions (Signed)
It is possible your rash is related to allergic reaction to sulfa and some exposure. Take the prescribed medication as instructed. Followup with your primary care provider if persistent symptoms despite following treatment after 1 week or return earlier if worsening symptoms despite following treatment.

## 2011-12-12 NOTE — ED Notes (Signed)
Pt   HAS  ITCHY  RASH       X  SEV  WEEKS   OVER BODY  NO  NEW  MEDS  NO    KNOWN CAUSATIVE  AGENTS      Pt is  Sitting  Upright on  Exam table  In no  Distress   Speaking in  Complete   sentances         No  Angio edema

## 2011-12-18 ENCOUNTER — Ambulatory Visit (INDEPENDENT_AMBULATORY_CARE_PROVIDER_SITE_OTHER): Payer: Self-pay | Admitting: Internal Medicine

## 2011-12-18 ENCOUNTER — Encounter: Payer: Self-pay | Admitting: Internal Medicine

## 2011-12-18 VITALS — BP 152/99 | HR 89 | Temp 98.3°F | Wt 223.0 lb

## 2011-12-18 DIAGNOSIS — L309 Dermatitis, unspecified: Secondary | ICD-10-CM

## 2011-12-18 DIAGNOSIS — L259 Unspecified contact dermatitis, unspecified cause: Secondary | ICD-10-CM

## 2011-12-18 MED ORDER — HYDROXYZINE HCL 50 MG PO TABS: 50.0000 mg | ORAL_TABLET | Freq: Three times a day (TID) | ORAL | Status: DC | PRN

## 2011-12-18 MED ORDER — HYDROCORTISONE 1 % EX CREA: TOPICAL_CREAM | CUTANEOUS | Status: DC

## 2011-12-18 MED ORDER — HYDROXYZINE HCL 10 MG PO TABS: 10.0000 mg | ORAL_TABLET | Freq: Three times a day (TID) | ORAL | Status: AC | PRN

## 2011-12-18 NOTE — ED Notes (Signed)
No pain

## 2011-12-23 ENCOUNTER — Encounter (HOSPITAL_COMMUNITY): Payer: Self-pay | Admitting: *Deleted

## 2011-12-23 ENCOUNTER — Emergency Department (HOSPITAL_COMMUNITY)
Admission: EM | Admit: 2011-12-23 | Discharge: 2011-12-23 | Disposition: A | Discharge status: Home / Self Care | Payer: Self-pay | Attending: Emergency Medicine | Admitting: Emergency Medicine

## 2011-12-23 DIAGNOSIS — Z79899 Other long term (current) drug therapy: Secondary | ICD-10-CM | POA: Insufficient documentation

## 2011-12-23 DIAGNOSIS — L259 Unspecified contact dermatitis, unspecified cause: Secondary | ICD-10-CM | POA: Insufficient documentation

## 2011-12-23 DIAGNOSIS — I10 Essential (primary) hypertension: Secondary | ICD-10-CM | POA: Insufficient documentation

## 2011-12-23 DIAGNOSIS — Z21 Asymptomatic human immunodeficiency virus [HIV] infection status: Secondary | ICD-10-CM | POA: Insufficient documentation

## 2011-12-23 DIAGNOSIS — F17200 Nicotine dependence, unspecified, uncomplicated: Secondary | ICD-10-CM | POA: Insufficient documentation

## 2011-12-23 DIAGNOSIS — L309 Dermatitis, unspecified: Secondary | ICD-10-CM

## 2011-12-23 MED ORDER — HYDROXYZINE HCL 10 MG PO TABS: 10.0000 mg | ORAL_TABLET | Freq: Three times a day (TID) | ORAL | Status: AC | PRN

## 2011-12-23 MED ORDER — HYDROCORTISONE ACE-PRAMOXINE 2.5-1 % EX CREA: 1.0000 "application " | TOPICAL_CREAM | Freq: Three times a day (TID) | CUTANEOUS | Status: DC

## 2011-12-23 NOTE — ED Notes (Signed)
Pt. reports having and on and off again rash.  Pt. Has been treated for this before with creams and oral steroids but has not gotten better.  Pt. report having the rash today on his neck and arms.  Pt. reports pain with this rash.

## 2011-12-23 NOTE — ED Provider Notes (Signed)
History     CSN: [id number]  Arrival date & time 12/23/11  7829   First MD Initiated Contact with Patient 12/23/11 0957      Chief Complaint  Patient presents with  . Rash    (Consider location/radiation/quality/duration/timing/severity/associated sxs/prior treatment) HPI Comments: Patient with a history of HIV and hypertension presents emergency department chief complaint of rash.  Onset of rash began about one month ago and patient has been evaluated for this last week.  He reports that he is currently been using ketoconazole without relief and that he was mistakenly prescribed a rectal corticosteroid that he has not used.  Patient reports his last CD4 count was "good" and that he has been compliant on his antiviral medication.  Patient denies fevers, night sweats, chills, shortness of breath, nighttime pruritis, the use of new products or signs of infection including erythema, warmth, or streaking up arm.  Patient has no other complaints at this time.  Patient is a 41 y.o. male presenting with rash. The history is provided by the patient.  Rash     Past Medical History  Diagnosis Date  . Hypertension   . HIV (human immunodeficiency virus infection)     History reviewed. No pertinent past surgical history.  History reviewed. No pertinent family history.  History  Substance Use Topics  . Smoking status: Current Everyday Smoker -- 0.3 packs/day for 23 years    Types: Cigarettes  . Smokeless tobacco: Never Used  . Alcohol Use: No      Review of Systems  Constitutional: Negative for fever, chills and appetite change.  HENT: Negative for congestion.   Eyes: Negative for visual disturbance.  Respiratory: Negative for shortness of breath.   Cardiovascular: Negative for chest pain and leg swelling.  Gastrointestinal: Negative for abdominal pain.  Genitourinary: Negative for dysuria, urgency and frequency.  Skin: Positive for rash.  Neurological: Negative for dizziness,  syncope, weakness, light-headedness, numbness and headaches.  Psychiatric/Behavioral: Negative for confusion.  All other systems reviewed and are negative.    Allergies  Ibuprofen  Home Medications   Current Outpatient Rx  Name Route Sig Dispense Refill  . ALBUTEROL SULFATE HFA 108 (90 BASE) MCG/ACT IN AERS Inhalation Inhale 2 puffs into the lungs every 6 (six) hours as needed. Shortness of breath    . EFAVIRENZ-EMTRICITAB-TENOFOVIR 600-200-300 MG PO TABS Oral Take 1 tablet by mouth at bedtime. 30 tablet 6  . HYDROXYZINE HCL 10 MG PO TABS Oral Take 1 tablet (10 mg total) by mouth 3 (three) times daily as needed for itching. 30 tablet 0  . PRESCRIPTION MEDICATION Oral Take 1 tablet by mouth daily.      BP 145/97  Pulse 100  Temp 97.8 F (36.6 C)  Resp 20  SpO2 99%  Physical Exam  Nursing note and vitals reviewed. Constitutional: He is oriented to person, place, and time. He appears well-developed and well-nourished. No distress.  HENT:  Head: Normocephalic and atraumatic.  Eyes: Conjunctivae and EOM are normal.  Neck: Normal range of motion.  Pulmonary/Chest: Effort normal.  Musculoskeletal: Normal range of motion.  Neurological: He is alert and oriented to person, place, and time.  Skin: Skin is warm and dry. Rash noted. He is not diaphoretic.       Papular rash located on extensor surfaces of arms bilaterally and neck.  Excoriations present on the arms.  No evidence of rash in finger webs.  Psychiatric: He has a normal mood and affect. His behavior is normal.  ED Course  Procedures (including critical care time)  Labs Reviewed - No data to display No results found.   No diagnosis found.    MDM  Patient presents with a dermatitis.  Patient treated with Atarax and hydrocortisone pramoxine cream.  Patient advised to followup with dermatology for better evaluation of rash. Discussed importance of keeping ID follow up & medication compliance. Patient verbalizes  understanding. No current evidence of skin infection, VSS & in NAD.         Jaci Carrel, [place] 12/23/11 1023

## 2011-12-23 NOTE — Discharge Instructions (Signed)
Call the dermatologist listed above for follow up if treatment does not work. Use atarax to help with the itiching. Return to the ED if signs of infection present, including fevers, night sweats, chills, warmth, or redness. Eczema Atopic dermatitis, or eczema, is an inherited type of sensitive skin. Often people with eczema have a family history of allergies, asthma, or hay fever. It causes a red itchy rash and dry scaly skin. The itchiness may occur before the skin rash and may be very intense. It is not contagious. Eczema is generally worse during the cooler winter months and often improves with the warmth of summer. Eczema usually starts showing signs in infancy. Some children outgrow eczema, but it may last through adulthood. Flare-ups may be caused by:  Eating something or contact with something you are sensitive or allergic to.   Stress.  DIAGNOSIS  The diagnosis of eczema is usually based upon symptoms and medical history. TREATMENT  Eczema cannot be cured, but symptoms usually can be controlled with treatment or avoidance of allergens (things to which you are sensitive or allergic to).  Controlling the itching and scratching.   Use over-the-counter antihistamines as directed for itching. It is especially useful at night when the itching tends to be worse.   Use over-the-counter steroid creams as directed for itching.   Scratching makes the rash and itching worse and may cause impetigo (a skin infection) if fingernails are contaminated (dirty).   Keeping the skin well moisturized with creams every day. This will seal in moisture and help prevent dryness. Lotions containing alcohol and water can dry the skin and are not recommended.   Limiting exposure to allergens.   Recognizing situations that cause stress.   Developing a plan to manage stress.  HOME CARE INSTRUCTIONS   Take prescription and over-the-counter medicines as directed by your caregiver.   Do not use anything on the  skin without checking with your caregiver.   Keep baths or showers short (5 minutes) in warm (not hot) water. Use mild cleansers for bathing. You may add non-perfumed bath oil to the bath water. It is best to avoid soap and bubble bath.   Immediately after a bath or shower, when the skin is still damp, apply a moisturizing ointment to the entire body. This ointment should be a petroleum ointment. This will seal in moisture and help prevent dryness. The thicker the ointment the better. These should be unscented.   Keep fingernails cut short and wash hands often. If your child has eczema, it may be necessary to put soft gloves or mittens on your child at night.   Dress in clothes made of cotton or cotton blends. Dress lightly, as heat increases itching.   Avoid foods that may cause flare-ups. Common foods include cow's milk, peanut butter, eggs and wheat.   Keep a child with eczema away from anyone with fever blisters. The virus that causes fever blisters (herpes simplex) can cause a serious skin infection in children with eczema.  SEEK MEDICAL CARE IF:   Itching interferes with sleep.   The rash gets worse or is not better within one week following treatment.   The rash looks infected (pus or soft yellow scabs).   You or your child has an oral temperature above 102 F (38.9 C).   Your baby is older than 3 months with a rectal temperature of 100.5 F (38.1 C) or higher for more than 1 day.   The rash flares up after contact with someone  who has fever blisters.  SEEK IMMEDIATE MEDICAL CARE IF:   Your baby is older than 3 months with a rectal temperature of 102 F (38.9 C) or higher.   Your baby is older than 3 months or younger with a rectal temperature of 100.4 F (38 C) or higher.  Document Released: 07/13/2000 Document Revised: 07/05/2011 Document Reviewed: 05/18/2009 [facility] Patient Information 2012 [place], [place].Contact Dermatitis Contact dermatitis is a reaction to certain  substances that touch the skin. Contact dermatitis can be either irritant contact dermatitis or allergic contact dermatitis. Irritant contact dermatitis does not require previous exposure to the substance for a reaction to occur.Allergic contact dermatitis only occurs if you have been exposed to the substance before. Upon a repeat exposure, your body reacts to the substance.  CAUSES  Many substances can cause contact dermatitis. Irritant dermatitis is most commonly caused by repeated exposure to mildly irritating substances, such as:  Makeup.   Soaps.   Detergents.   Bleaches.   Acids.   Metal salts, such as nickel.  Allergic contact dermatitis is most commonly caused by exposure to:  Poisonous plants.   Chemicals (deodorants, shampoos).   Jewelry.   Latex.   Neomycin in triple antibiotic cream.   Preservatives in products, including clothing.  SYMPTOMS  The area of skin that is exposed may develop:  Dryness or flaking.   Redness.   Cracks.   Itching.   Pain or a burning sensation.   Blisters.  With allergic contact dermatitis, there may also be swelling in areas such as the eyelids, mouth, or genitals.  DIAGNOSIS  Your caregiver can usually tell what the problem is by doing a physical exam. In cases where the cause is uncertain and an allergic contact dermatitis is suspected, a patch skin test may be performed to help determine the cause of your dermatitis. TREATMENT Treatment includes protecting the skin from further contact with the irritating substance by avoiding that substance if possible. Barrier creams, powders, and gloves may be helpful. Your caregiver may also recommend:  Steroid creams or ointments applied 2 times daily. For best results, soak the rash area in cool water for 20 minutes. Then apply the medicine. Cover the area with a plastic wrap. You can store the steroid cream in the refrigerator for a "chilly" effect on your rash. That may decrease  itching. Oral steroid medicines may be needed in more severe cases.   Antibiotics or antibacterial ointments if a skin infection is present.   Antihistamine lotion or an antihistamine taken by mouth to ease itching.   Lubricants to keep moisture in your skin.   Burow's solution to reduce redness and soreness or to dry a weeping rash. Mix one packet or tablet of solution in 2 cups cool water. Dip a clean washcloth in the mixture, wring it out a bit, and put it on the affected area. Leave the cloth in place for 30 minutes. Do this as often as possible throughout the day.   Taking several cornstarch or baking soda baths daily if the area is too large to cover with a washcloth.  Harsh chemicals, such as alkalis or acids, can cause skin damage that is like a burn. You should flush your skin for 15 to 20 minutes with cold water after such an exposure. You should also seek immediate medical care after exposure. Bandages (dressings), antibiotics, and pain medicine may be needed for severely irritated skin.  HOME CARE INSTRUCTIONS  Avoid the substance that caused your reaction.  Keep the area of skin that is affected away from hot water, soap, sunlight, chemicals, acidic substances, or anything else that would irritate your skin.   Do not scratch the rash. Scratching may cause the rash to become infected.   You may take cool baths to help stop the itching.   Only take over-the-counter or prescription medicines as directed by your caregiver.   See your caregiver for follow-up care as directed to make sure your skin is healing properly.  SEEK MEDICAL CARE IF:   Your condition is not better after 3 days of treatment.   You seem to be getting worse.   You see signs of infection such as swelling, tenderness, redness, soreness, or warmth in the affected area.   You have any problems related to your medicines.  Document Released: 07/13/2000 Document Revised: 07/05/2011 Document Reviewed:  12/19/2010 [facility] Patient Information 2012 [place], [place].

## 2011-12-24 NOTE — ED Provider Notes (Signed)
Medical screening examination/treatment/procedure(s) were performed by non-physician practitioner and as supervising physician I was immediately available for consultation/collaboration.   Carleene Cooper III, MD 12/24/11 [phone]

## 2011-12-25 ENCOUNTER — Telehealth: Payer: Self-pay | Admitting: *Deleted

## 2011-12-25 NOTE — Telephone Encounter (Signed)
States his rash is still bothering him a lot. He went to ED Sunday & was given more meds. He wants to be seen by a dermatologiist. He has an orange card. Given to E.E., RN to handle. I told him we will call him asap

## 2012-01-15 ENCOUNTER — Telehealth: Payer: Self-pay | Admitting: *Deleted

## 2012-01-15 NOTE — Telephone Encounter (Signed)
rec'd a fax from [facility]. He has an appt with Dr. Terri Piedra on 02/18/12 at 9:55am. He needs to bring $40 with him as a co-pay. They are at [address] [facility] is [phone]. To bring orange card. Call if unable to go. If a no show twice in 12 months, he "will be dropped from specialty care services" I left him a message to call me for appt info

## 2012-01-16 NOTE — Telephone Encounter (Signed)
I left him another message.

## 2012-01-17 ENCOUNTER — Telehealth: Payer: Self-pay | Admitting: *Deleted

## 2012-01-17 NOTE — Telephone Encounter (Signed)
States he knows about the appt

## 2012-01-17 NOTE — Telephone Encounter (Signed)
RN spoke with pt to give him the Dermatology appt information.  Pt wrote the information down and read it back.  RN advised that pt needs to call Dr. Dorita Sciara office if he needs to reschedule or cancel prior to the appt due to [facility] policy of not allowing 2-No Show appt in a 75-month time period.  Pt verbalized understanding.

## 2012-02-04 ENCOUNTER — Emergency Department (HOSPITAL_COMMUNITY): Payer: Self-pay

## 2012-02-04 ENCOUNTER — Encounter (HOSPITAL_COMMUNITY): Payer: Self-pay | Admitting: Emergency Medicine

## 2012-02-04 ENCOUNTER — Emergency Department (HOSPITAL_COMMUNITY)
Admission: EM | Admit: 2012-02-04 | Discharge: 2012-02-04 | Disposition: A | Discharge status: Home / Self Care | Payer: Self-pay | Attending: Emergency Medicine | Admitting: Emergency Medicine

## 2012-02-04 DIAGNOSIS — Z21 Asymptomatic human immunodeficiency virus [HIV] infection status: Secondary | ICD-10-CM | POA: Insufficient documentation

## 2012-02-04 DIAGNOSIS — S7001XA Contusion of right hip, initial encounter: Secondary | ICD-10-CM

## 2012-02-04 DIAGNOSIS — M25519 Pain in unspecified shoulder: Secondary | ICD-10-CM | POA: Insufficient documentation

## 2012-02-04 DIAGNOSIS — M25559 Pain in unspecified hip: Secondary | ICD-10-CM | POA: Insufficient documentation

## 2012-02-04 DIAGNOSIS — I10 Essential (primary) hypertension: Secondary | ICD-10-CM | POA: Insufficient documentation

## 2012-02-04 DIAGNOSIS — J40 Bronchitis, not specified as acute or chronic: Secondary | ICD-10-CM | POA: Insufficient documentation

## 2012-02-04 DIAGNOSIS — J3489 Other specified disorders of nose and nasal sinuses: Secondary | ICD-10-CM | POA: Insufficient documentation

## 2012-02-04 DIAGNOSIS — IMO0002 Reserved for concepts with insufficient information to code with codable children: Secondary | ICD-10-CM | POA: Insufficient documentation

## 2012-02-04 DIAGNOSIS — W19XXXA Unspecified fall, initial encounter: Secondary | ICD-10-CM

## 2012-02-04 DIAGNOSIS — F17200 Nicotine dependence, unspecified, uncomplicated: Secondary | ICD-10-CM | POA: Insufficient documentation

## 2012-02-04 DIAGNOSIS — S46911A Strain of unspecified muscle, fascia and tendon at shoulder and upper arm level, right arm, initial encounter: Secondary | ICD-10-CM

## 2012-02-04 DIAGNOSIS — W010XXA Fall on same level from slipping, tripping and stumbling without subsequent striking against object, initial encounter: Secondary | ICD-10-CM | POA: Insufficient documentation

## 2012-02-04 DIAGNOSIS — S7000XA Contusion of unspecified hip, initial encounter: Secondary | ICD-10-CM | POA: Insufficient documentation

## 2012-02-04 MED ORDER — IPRATROPIUM BROMIDE 0.02 % IN SOLN
0.5000 mg | Freq: Once | RESPIRATORY_TRACT | Status: AC
  Administered 2012-02-04: 0.5 mg via RESPIRATORY_TRACT
  Filled 2012-02-04: qty 2.5

## 2012-02-04 MED ORDER — ALBUTEROL SULFATE (5 MG/ML) 0.5% IN NEBU
2.5000 mg | INHALATION_SOLUTION | Freq: Once | RESPIRATORY_TRACT | Status: AC
  Administered 2012-02-04: 2.5 mg via RESPIRATORY_TRACT
  Filled 2012-02-04: qty 0.5

## 2012-02-04 MED ORDER — OXYCODONE-ACETAMINOPHEN 5-325 MG PO TABS: 1.0000 | ORAL_TABLET | ORAL | Status: AC | PRN

## 2012-02-04 MED ORDER — ALBUTEROL SULFATE HFA 108 (90 BASE) MCG/ACT IN AERS
2.0000 | INHALATION_SPRAY | RESPIRATORY_TRACT | Status: DC | PRN
  Filled 2012-02-04: qty 6.7

## 2012-02-04 NOTE — ED Notes (Addendum)
PT fell a Sunday on right hip; reports been hurting him especially when walking up stairs. Pt also reports yellow mucous that he coughs up after being asleep.

## 2012-02-04 NOTE — ED Notes (Signed)
Patient transported to X-ray 

## 2012-02-04 NOTE — ED Provider Notes (Signed)
History     CSN: [id number]  Arrival date & time 02/04/12  1944   First MD Initiated Contact with Patient 02/04/12 2204      Chief Complaint  Patient presents with  . Leg Pain    (Consider location/radiation/quality/duration/timing/severity/associated sxs/prior treatment) Patient is a 41 y.o. male presenting with leg pain. The history is provided by the patient.  Leg Pain   Yesterday, he fell while he was playing with his children and as he fell, he reached up with his right arm to try and catch himself. He landed on his right hip periods complaining of pain in the right hip which is worse when he goes up steps, and she also has pain in his right shoulder which is worse if he tries to reach out with his arm. Pain is moderate and rated 6/10. He also has noted that he feels congested in his chest and he noticed an mostly when he wakes up. There's a slight cough and some mild dyspnea. He denies fever, chills, sweats. What cough there is, is nonproductive.  Past Medical History  Diagnosis Date  . Hypertension   . HIV (human immunodeficiency virus infection)     No past surgical history on file.  No family history on file.  History  Substance Use Topics  . Smoking status: Current Everyday Smoker -- 0.3 packs/day for 23 years    Types: Cigarettes  . Smokeless tobacco: Never Used  . Alcohol Use: No      Review of Systems  All other systems reviewed and are negative.    Allergies  Ibuprofen  Home Medications   Current Outpatient Rx  Name Route Sig Dispense Refill  . ALBUTEROL SULFATE HFA 108 (90 BASE) MCG/ACT IN AERS Inhalation Inhale 2 puffs into the lungs every 6 (six) hours as needed. Shortness of breath    . EFAVIRENZ-EMTRICITAB-TENOFOVIR 600-200-300 MG PO TABS Oral Take 1 tablet by mouth at bedtime.      BP 127/93  Pulse 85  Temp 98.7 F (37.1 C) (Oral)  SpO2 98%  Physical Exam  Nursing note and vitals reviewed.  40 year old male who is resting comfortably  and in no acute distress. Vital signs are normal. Oxygen saturation is 90% which is normal. Head is normocephalic and atraumatic. PERRLA, EOMI. Neck is nontender and supple. Back is nontender. Lungs are clear without rales, wheezes, or rhonchi, however, there is a prolonged exhalation phase. Heart has a regular rate and rhythm without murmur. Abdomen is soft, flat, nontender without masses or hepatosplenomegaly. Extremities: There is mild tenderness to the soft tissues of the right shoulder and rotator cuff impingement signs are present. There is mild tenderness palpation of the right hip over the inguinal fold anteriorly. There is mild pain with range of motion, but no point tenderness. No other extremity injuries seen. There is no swelling or deformity. Distal neurovascular exam is intact with strong pulses and prompt capillary refill. Skin is warm and dry without rash. Neurologic: Mental status is normal, cranial nerves are intact, there are no motor or sensory deficits  ED Course  Procedures (including critical care time)  Dg Chest 2 View  02/04/2012  *RADIOLOGY REPORT*  Clinical Data: Fall  CHEST - 2 VIEW  Comparison: 12/04/2011  Findings: Upper normal heart size.  Stable bronchitic changes.  No peripheral consolidation.  No pneumothorax.  No pleural effusion. Stable thoracic spine.  IMPRESSION: Stable bronchitic changes.  Original Report Authenticated By: Donavan Burnet, M.D.   Dg Shoulder Right  02/04/2012  *RADIOLOGY REPORT*  Clinical Data: Fall  RIGHT SHOULDER - 2+ VIEW  Comparison: 12/17/2010  Findings: No acute fracture and no dislocation.  Unremarkable soft tissues.  IMPRESSION: No acute bony pathology.  Original Report Authenticated By: Donavan Burnet, M.D.   Dg Hip Complete Right  02/04/2012  *RADIOLOGY REPORT*  Clinical Data: Fall  RIGHT HIP - COMPLETE 2+ VIEW  Comparison: 06/28/2011  Findings: No acute fracture and no dislocation.  Unremarkable soft tissues.  IMPRESSION: No acute bony  pathology.  Original Report Authenticated By: Donavan Burnet, M.D.     1. Fall   2. Contusion of right hip   3. Strain of right shoulder   4. Bronchitis       MDM  Fall with apparent right rotator cuff injury and contusion or strain of the right hip. This condition is most likely bronchitis as he is a cigarette smoker. This x-ray and x-rays of the shoulder and hip will be obtained and he will be given albuterol nebulizer treatment and reassessed.   He is feeling considerably better after albuterol with Atrovent. He will be sent home with an albuterol inhaler. X-rays are negative for fracture he is given a prescription for Percocet for pain. NSAIDs are not given because of allergy.     Dione Booze, MD 02/04/12 [phone]

## 2012-02-04 NOTE — ED Notes (Signed)
Pt states that he feels"alittle better" since his breathing treatment.

## 2012-02-10 ENCOUNTER — Other Ambulatory Visit: Payer: Self-pay | Admitting: Infectious Diseases

## 2012-02-11 ENCOUNTER — Other Ambulatory Visit: Payer: Self-pay | Admitting: Licensed Clinical Social Worker

## 2012-02-11 DIAGNOSIS — B20 Human immunodeficiency virus [HIV] disease: Secondary | ICD-10-CM

## 2012-02-11 MED ORDER — EFAVIRENZ-EMTRICITAB-TENOFOVIR 600-200-300 MG PO TABS: 1.0000 | ORAL_TABLET | Freq: Every day | ORAL | Status: DC

## 2012-02-19 ENCOUNTER — Other Ambulatory Visit: Payer: Self-pay

## 2012-02-19 ENCOUNTER — Other Ambulatory Visit (INDEPENDENT_AMBULATORY_CARE_PROVIDER_SITE_OTHER): Payer: Self-pay

## 2012-02-19 ENCOUNTER — Ambulatory Visit: Payer: Self-pay

## 2012-02-19 DIAGNOSIS — B20 Human immunodeficiency virus [HIV] disease: Secondary | ICD-10-CM

## 2012-02-19 LAB — COMPLETE METABOLIC PANEL WITH GFR
AST: 31 U/L (ref 0–37)
Alkaline Phosphatase: 75 U/L (ref 39–117)
BUN: 11 mg/dL (ref 6–23)
Calcium: 8.9 mg/dL (ref 8.4–10.5)
Creat: 1.3 mg/dL (ref 0.50–1.35)
Total Bilirubin: 0.3 mg/dL (ref 0.3–1.2)

## 2012-02-19 LAB — CBC WITH DIFFERENTIAL/PLATELET
Eosinophils Relative: 4 % (ref 0–5)
HCT: 44 % (ref 39.0–52.0)
Lymphocytes Relative: 44 % (ref 12–46)
Lymphs Abs: 2.4 10*3/uL (ref 0.7–4.0)
MCV: 95 fL (ref 78.0–100.0)
Platelets: 195 10*3/uL (ref 150–400)
RBC: 4.63 MIL/uL (ref 4.22–5.81)
WBC: 5.5 10*3/uL (ref 4.0–10.5)

## 2012-02-20 LAB — HIV-1 RNA QUANT-NO REFLEX-BLD: HIV 1 RNA Quant: <20 copies/mL (ref <20)

## 2012-03-04 ENCOUNTER — Ambulatory Visit: Payer: Self-pay

## 2012-03-04 ENCOUNTER — Ambulatory Visit (INDEPENDENT_AMBULATORY_CARE_PROVIDER_SITE_OTHER): Payer: Self-pay | Admitting: Internal Medicine

## 2012-03-04 ENCOUNTER — Encounter: Payer: Self-pay | Admitting: Internal Medicine

## 2012-03-04 VITALS — BP 145/82 | HR 98 | Temp 98.8°F | Ht 66.0 in | Wt 221.8 lb

## 2012-03-04 DIAGNOSIS — B20 Human immunodeficiency virus [HIV] disease: Secondary | ICD-10-CM

## 2012-03-04 DIAGNOSIS — I10 Essential (primary) hypertension: Secondary | ICD-10-CM

## 2012-03-04 DIAGNOSIS — F322 Major depressive disorder, single episode, severe without psychotic features: Secondary | ICD-10-CM

## 2012-03-04 NOTE — Progress Notes (Signed)
Patient ID: Drew Cuevas, male   DOB: July 02, 1971, 41 y.o.   MRN: [id number]    [facility] for Infectious Disease  Patient Active Problem List  Diagnosis  . HIV DISEASE  . DEPRESSION, ACUTE  . Essential hypertension, benign  . INTERNAL HEMORRHOIDS WITHOUT MENTION COMP  . Recurrent boils  . Right knee pain  . Diarrhea    Patient's Medications  New Prescriptions   No medications on file  Previous Medications   ALBUTEROL (PROVENTIL HFA;VENTOLIN HFA) 108 (90 BASE) MCG/ACT INHALER    Inhale 2 puffs into the lungs every 6 (six) hours as needed. Shortness of breath   EFAVIRENZ-EMTRICITABINE-TENOFOVIR (ATRIPLA) 600-200-300 MG PER TABLET    Take 1 tablet by mouth at bedtime.  Modified Medications   No medications on file  Discontinued Medications   ATRIPLA 600-200-300 MG PER TABLET    TAKE 1 TABLET BY MOUTH EVERY NIGHT AT BEDTIME    Subjective: Abhishek is in for his routine visit. He recalls missing only one dose of his Atripla since his last visit. This occurred when he fell asleep before taking it. He continues to smoke cigarettes and does not yet have a plan to quit completely. He continues to be bothered by intermittent asthma and has been using a friend's nebulizer. He feels very tired every day he admits to being depressed ever since he was diagnosed with HIV infection. His only told one other person, his roommate. He recalls taking Celexa for about 2 years before he was diagnosed. He states that the Celexa made him feel even more sleepy and did not help his depression. He has never seen a Veterinary surgeon. He had thoughts of hurting himself many years ago but has not had any recently.  Objective: Temp: 98.8 F (37.1 C) (08/06 1447) Temp src: Oral (08/06 1447) BP: 145/82 mmHg (08/06 1447) Pulse Rate: 98  (08/06 1447)  General: He is tearful. His body mass index is just over 35 Skin: No rash Lungs: Clear Cor: Regular S1-S2 no murmurs Abdomen: Obese, soft and  nontender  Assessment: His HIV infection remains under excellent control. I will continue his Atripla.  He has chronic depression and I've encouraged him to see our mental health counselor.  I have also given him the [place] quit line number and encouraged him to call so that he can develop a personalize plan to quit smoking cigarettes.  His blood pressure is slightly elevated today.  Plan: 1. Continue Atripla 2. Mental health counseling 3. Cigarette cessation counseling 4. Followup in 6 weeks   Cliffton Asters, MD [facility] for Infectious Disease [facility] [phone] pager   [phone] cell 03/04/2012, 3:25 PM

## 2012-03-06 ENCOUNTER — Encounter (HOSPITAL_COMMUNITY): Payer: Self-pay

## 2012-03-06 ENCOUNTER — Telehealth: Payer: Self-pay | Admitting: *Deleted

## 2012-03-06 ENCOUNTER — Emergency Department (HOSPITAL_COMMUNITY)
Admission: EM | Admit: 2012-03-06 | Discharge: 2012-03-06 | Disposition: A | Discharge status: Home / Self Care | Payer: Medicaid Other | Attending: Emergency Medicine | Admitting: Emergency Medicine

## 2012-03-06 DIAGNOSIS — Z21 Asymptomatic human immunodeficiency virus [HIV] infection status: Secondary | ICD-10-CM | POA: Insufficient documentation

## 2012-03-06 DIAGNOSIS — I10 Essential (primary) hypertension: Secondary | ICD-10-CM | POA: Insufficient documentation

## 2012-03-06 DIAGNOSIS — Z886 Allergy status to analgesic agent status: Secondary | ICD-10-CM | POA: Insufficient documentation

## 2012-03-06 DIAGNOSIS — Z79899 Other long term (current) drug therapy: Secondary | ICD-10-CM | POA: Insufficient documentation

## 2012-03-06 DIAGNOSIS — F17200 Nicotine dependence, unspecified, uncomplicated: Secondary | ICD-10-CM | POA: Insufficient documentation

## 2012-03-06 DIAGNOSIS — M25559 Pain in unspecified hip: Secondary | ICD-10-CM | POA: Insufficient documentation

## 2012-03-06 MED ORDER — HYDROMORPHONE HCL PF 1 MG/ML IJ SOLN
1.0000 mg | Freq: Once | INTRAMUSCULAR | Status: AC
  Administered 2012-03-06: 1 mg via INTRAMUSCULAR
  Filled 2012-03-06: qty 1

## 2012-03-06 MED ORDER — TRAMADOL HCL 50 MG PO TABS
50.0000 mg | ORAL_TABLET | Freq: Four times a day (QID) | ORAL | Status: AC | PRN
Start: 2012-03-06 — End: 2012-03-16

## 2012-03-06 MED ORDER — CYCLOBENZAPRINE HCL 10 MG PO TABS: 10.0000 mg | ORAL_TABLET | Freq: Two times a day (BID) | ORAL | Status: AC | PRN

## 2012-03-06 NOTE — ED Notes (Signed)
Pt reports (R) hip pain radiating down (R) leg starting yesterday, reports chronic hx x2 years. Pt denies any injury or heavy lifting

## 2012-03-06 NOTE — Telephone Encounter (Signed)
Patient walked into clinic c/o right knee pain.  Was seen 2 days ago and it was not hurting at that time, this is an ongoing problem.  Told him he could wait to be seen at the end of the morning or if we had a no show he could be worked in.  He is going to wait. Wendall Mola CMA

## 2012-03-06 NOTE — ED Provider Notes (Signed)
History  Scribed for [name], MD, the patient was seen in room TR06C/TR06C. This chart was scribed by Candelaria Stagers. The patient's care started at 11:45 AM   CSN: [id number]  Arrival date & time 03/06/12  4782   First MD Initiated Contact with Patient 03/06/12 1144      Chief Complaint  Patient presents with  . Hip Pain     The history is provided by the patient.   Drew Cuevas is a 41 y.o. male who presents to the Emergency Department complaining of chronic right leg and right hip pain that became worse yesterday.  Pt was seen by his PCP four days ago.  Moving makes the pain worse.  Nothing seems to make the pain better or worse.     Past Medical History  Diagnosis Date  . Hypertension   . HIV (human immunodeficiency virus infection)     History reviewed. No pertinent past surgical history.  History reviewed. No pertinent family history.  History  Substance Use Topics  . Smoking status: Current Everyday Smoker -- 0.3 packs/day for 23 years    Types: Cigarettes  . Smokeless tobacco: Never Used  . Alcohol Use: No      Review of Systems  Constitutional: Negative for fever.       10 Systems reviewed and are negative for acute change except as noted in the HPI.  HENT: Negative for congestion.   Eyes: Negative for discharge and redness.  Respiratory: Negative for cough and shortness of breath.   Cardiovascular: Negative for chest pain.  Gastrointestinal: Negative for vomiting and abdominal pain.  Musculoskeletal: Positive for back pain and arthralgias (right hip pain).  Skin: Negative for rash.  Neurological: Negative for syncope, numbness and headaches.  Psychiatric/Behavioral:       No behavior change.    Allergies  Ibuprofen  Home Medications   Current Outpatient Rx  Name Route Sig Dispense Refill  . ALBUTEROL SULFATE HFA 108 (90 BASE) MCG/ACT IN AERS Inhalation Inhale 2 puffs into the lungs every 6 (six) hours as needed. Shortness of breath    .  EFAVIRENZ-EMTRICITAB-TENOFOVIR 600-200-300 MG PO TABS Oral Take 1 tablet by mouth at bedtime. 30 tablet 11    BP 135/93  Pulse 86  Temp 98.4 F (36.9 C) (Oral)  Resp 16  SpO2 98%  Physical Exam  Nursing note and vitals reviewed. Constitutional: He appears well-developed and well-nourished.  HENT:  Head: Normocephalic and atraumatic.  Right Ear: External ear normal.  Left Ear: External ear normal.  Nose: Nose normal.  Eyes: Conjunctivae and EOM are normal.  Neck: Neck supple. No tracheal deviation present.  Pulmonary/Chest: Effort normal. No stridor. No respiratory distress.  Musculoskeletal: He exhibits no edema and no tenderness.       Lumbar back: He exhibits decreased range of motion, tenderness, pain and spasm. He exhibits no swelling and no edema.       Right sided back and hip pain.   Neurological: He is alert. He is not disoriented. No cranial nerve deficit or sensory deficit. He exhibits normal muscle tone. Coordination normal.  Reflex Scores:      Patellar reflexes are 2+ on the right side and 2+ on the left side.      Achilles reflexes are 2+ on the right side and 2+ on the left side. Skin: Skin is warm and dry. No rash noted. He is not diaphoretic. No erythema.  Psychiatric: He has a normal mood and affect. His behavior is  normal. Thought content normal.    ED Course  Procedures  DIAGNOSTIC STUDIES: Oxygen Saturation is 98% on room air, normal by my interpretation.    COORDINATION OF CARE:     Labs Reviewed - No data to display No results found.   1. Hip pain       MDM  The patient's symptoms suggest the possibility of sciatica. I have reviewed his old records however, he has a normal MRI of his lumbar spine in 2012. Patient also has had x-rays of his hip. He is not showing symptoms to suggest a septic arthritis. He is fever or other signs of infection. Patient be treated with medications for pain and muscle relaxants. Recommend followup with a primary  care Dr. or possibly an orthopedic or sports medicine Dr. I personally performed the services described in this documentation, which was scribed in my presence.  The recorded information has been reviewed and considered.         [name], MD 03/06/12 1200

## 2012-03-06 NOTE — ED Notes (Signed)
Pt here for right hip and leg pain onset 1 year ago. Pt seen for same and had normal xray and mri, no hx of sciatica per pt. Hurts to cough

## 2012-03-18 ENCOUNTER — Ambulatory Visit: Payer: Self-pay

## 2012-03-19 ENCOUNTER — Encounter (HOSPITAL_COMMUNITY): Payer: Self-pay

## 2012-03-19 ENCOUNTER — Encounter (HOSPITAL_COMMUNITY): Payer: Self-pay | Admitting: *Deleted

## 2012-03-19 ENCOUNTER — Emergency Department (HOSPITAL_COMMUNITY)
Admission: EM | Admit: 2012-03-19 | Discharge: 2012-03-19 | Disposition: A | Discharge status: Home / Self Care | Payer: Medicaid Other | Attending: Emergency Medicine | Admitting: Emergency Medicine

## 2012-03-19 ENCOUNTER — Emergency Department (INDEPENDENT_AMBULATORY_CARE_PROVIDER_SITE_OTHER)
Admission: EM | Admit: 2012-03-19 | Discharge: 2012-03-19 | Disposition: A | Discharge status: Home / Self Care | Payer: Medicaid Other | Source: Home / Self Care | Attending: Emergency Medicine | Admitting: Emergency Medicine

## 2012-03-19 ENCOUNTER — Encounter (HOSPITAL_COMMUNITY): Payer: Self-pay | Admitting: Emergency Medicine

## 2012-03-19 DIAGNOSIS — I10 Essential (primary) hypertension: Secondary | ICD-10-CM | POA: Insufficient documentation

## 2012-03-19 DIAGNOSIS — J329 Chronic sinusitis, unspecified: Secondary | ICD-10-CM

## 2012-03-19 DIAGNOSIS — F17200 Nicotine dependence, unspecified, uncomplicated: Secondary | ICD-10-CM | POA: Insufficient documentation

## 2012-03-19 DIAGNOSIS — J310 Chronic rhinitis: Secondary | ICD-10-CM

## 2012-03-19 DIAGNOSIS — Z888 Allergy status to other drugs, medicaments and biological substances status: Secondary | ICD-10-CM | POA: Insufficient documentation

## 2012-03-19 DIAGNOSIS — Z21 Asymptomatic human immunodeficiency virus [HIV] infection status: Secondary | ICD-10-CM | POA: Insufficient documentation

## 2012-03-19 HISTORY — DX: Immunodeficiency, unspecified: D84.9

## 2012-03-19 MED ORDER — FEXOFENADINE-PSEUDOEPHED ER 60-120 MG PO TB12: 1.0000 | ORAL_TABLET | Freq: Two times a day (BID) | ORAL | Status: DC

## 2012-03-19 MED ORDER — OXYMETAZOLINE HCL 0.05 % NA SOLN: 2.0000 | Freq: Two times a day (BID) | NASAL | Status: AC

## 2012-03-19 MED ORDER — HYDROCODONE-ACETAMINOPHEN 5-325 MG PO TABS: ORAL_TABLET | ORAL | Status: AC

## 2012-03-19 MED ORDER — HYDROCODONE-ACETAMINOPHEN 5-325 MG PO TABS
1.0000 | ORAL_TABLET | Freq: Once | ORAL | Status: AC
  Administered 2012-03-19: 1 via ORAL
  Filled 2012-03-19: qty 1

## 2012-03-19 MED ORDER — DOXYCYCLINE HYCLATE 100 MG PO CAPS: 100.0000 mg | ORAL_CAPSULE | Freq: Two times a day (BID) | ORAL | Status: DC

## 2012-03-19 MED ORDER — MUPIROCIN 2 % EX OINT: TOPICAL_OINTMENT | Freq: Three times a day (TID) | CUTANEOUS | Status: DC

## 2012-03-19 NOTE — ED Notes (Signed)
C/o facial pain, worse when bends over , coughs or sneezes; some yellow nasal secretions ; concerns for sinus infection

## 2012-03-19 NOTE — ED Notes (Signed)
Pt reports he was seen at Urgent care this AM and diagnosed with sinus infection.  Pt was given prescription for antibiotic.  Pt reports pain across nose and cheek. Pt reports nasal congestion and headache. Pt was not given pain medication at Urgent care.

## 2012-03-19 NOTE — ED Provider Notes (Signed)
History     CSN: [id number]  Arrival date & time 03/19/12  1952   First MD Initiated Contact with Patient 03/19/12 2013      Chief Complaint  Patient presents with  . Nasal Congestion  . Facial Pain    (Consider location/radiation/quality/duration/timing/severity/associated sxs/prior treatment) HPI Comments: Patient with h/o HIV, complicant with HAART, states last CD4 count was 'excellent' -- presents with 7 day history of sinus congestion and pain. Patient was seen at [facility] today and started on course of doxycycline for sinusitis. He presents here due to pain and states he needs something for pain relief of L sided sinus symptoms. He has filled doxycycline today. No other treatments prior to arrival. Patient reports unilateral L sided nasal congestion and sinus pressure. He has headache. No fevers. He denies photophobia or pain with movement of his eyes. He denies neck pain or trouble moving his neck. He has yellow rhinorrhea. No N/V/D. Onset gradual. Course constant. Nothing makes symptoms better or worse.   Patient is a 41 y.o. male presenting with URI. The history is provided by the patient and medical records.  URI The primary symptoms include headaches and ear pain (L sided). Primary symptoms do not include fever, sore throat, cough, abdominal pain, nausea, vomiting, myalgias or rash. The current episode started more than 1 week ago. This is a new problem. The problem has not changed since onset. The headache is not associated with photophobia, eye pain or neck stiffness.  Symptoms associated with the illness include facial pain, sinus pressure, congestion and rhinorrhea. The illness is not associated with chills or plugged ear sensation.    Past Medical History  Diagnosis Date  . Hypertension   . HIV (human immunodeficiency virus infection)   . Immune deficiency disorder     History reviewed. No pertinent past surgical history.  No family history on file.  History  Substance  Use Topics  . Smoking status: Current Everyday Smoker -- 0.3 packs/day for 23 years    Types: Cigarettes  . Smokeless tobacco: Never Used  . Alcohol Use: No      Review of Systems  Constitutional: Negative for fever and chills.  HENT: Positive for ear pain (L sided), congestion, rhinorrhea and sinus pressure. Negative for sore throat, trouble swallowing, neck pain, neck stiffness, dental problem and ear discharge.   Eyes: Negative for photophobia, pain, discharge, redness and visual disturbance.  Respiratory: Negative for cough and shortness of breath.   Cardiovascular: Negative for chest pain.  Gastrointestinal: Negative for nausea, vomiting, abdominal pain and diarrhea.  Musculoskeletal: Negative for myalgias.  Skin: Negative for rash.  Neurological: Positive for headaches.  Hematological: Negative for adenopathy.  Psychiatric/Behavioral: Negative for confusion.    Allergies  Ibuprofen  Home Medications   Current Outpatient Rx  Name Route Sig Dispense Refill  . ALBUTEROL SULFATE HFA 108 (90 BASE) MCG/ACT IN AERS Inhalation Inhale 2 puffs into the lungs every 6 (six) hours as needed. Shortness of breath    . DOXYCYCLINE HYCLATE 100 MG PO CAPS Oral Take 100 mg by mouth 2 (two) times daily.    . EFAVIRENZ-EMTRICITAB-TENOFOVIR 600-200-300 MG PO TABS Oral Take 1 tablet by mouth at bedtime. 30 tablet 11    BP 147/95  Pulse 96  Temp 98.8 F (37.1 C) (Oral)  Resp 18  SpO2 100%  Physical Exam  Nursing note and vitals reviewed. Constitutional: He appears well-developed and well-nourished.  HENT:  Head: Normocephalic and atraumatic. Head is without right periorbital erythema and  without left periorbital erythema. No trismus in the jaw.  Right Ear: Tympanic membrane, external ear and ear canal normal. Tympanic membrane is not erythematous.  Left Ear: Tympanic membrane, external ear and ear canal normal. Tympanic membrane is not erythematous.  Nose: Mucosal edema, rhinorrhea and  sinus tenderness present. No nose lacerations, nasal deformity, septal deviation or nasal septal hematoma. No epistaxis.  No foreign bodies. Right sinus exhibits no frontal sinus tenderness. Left sinus exhibits maxillary sinus tenderness. Left sinus exhibits no frontal sinus tenderness.  Mouth/Throat: Uvula is midline, oropharynx is clear and moist and mucous membranes are normal. No uvula swelling. No oropharyngeal exudate, posterior oropharyngeal edema, posterior oropharyngeal erythema or tonsillar abscesses.       Significant swelling/edema of L nare. No purulent drainage.   Eyes: Conjunctivae are normal. Right eye exhibits no discharge. Left eye exhibits no discharge.  Neck: Normal range of motion. Neck supple.  Cardiovascular: Normal rate, regular rhythm and normal heart sounds.   Pulmonary/Chest: Effort normal and breath sounds normal.  Abdominal: Soft. There is no tenderness.  Neurological: He is alert.  Skin: Skin is warm and dry.  Psychiatric: He has a normal mood and affect.    ED Course  Procedures (including critical care time)  Labs Reviewed - No data to display No results found.   1. Sinusitis     8:22 PM Patient seen and examined.    Vital signs reviewed and are as follows: Filed Vitals:   03/19/12 1955  BP: 147/95  Pulse: 96  Temp: 98.8 F (37.1 C)  Resp: 18   Will treat for pain as well as give rx for afrin. Instructed pt on proper use of afrin.   Urged close follow-up if not improving in 2-3 days or sooner if feeling worse, if he develops fever, significant eye symptoms, skin redness on face, neck pain.   Patient verbalizes understanding and agrees with plan.   Patient counseled on use of narcotic pain medications. Counseled not to combine these medications with others containing tylenol. Urged not to drink alcohol, drive, or perform any other activities that requires focus while taking these medications. The patient verbalizes understanding and agrees with  the plan.    MDM  Patient with sinusitis -- per patient report, his HIV is controlled and CD4 counts are stable and acceptable. Do not suspect any complications of sinusitis at this time including extension to central nervous system (meningitis), orbit of the eye (orbital cellulitis), orbital tissues (orbital cellulitis or osteitis of the sinus bones). Patient is afebrile and non-toxic in appearance. I do agree with antibiotic treatment given duration of sx and unilateral symptoms including significant edema and tenderness on exam. For this reason, I have also given strict follow-up and return instructions.        [place], [place] 03/20/12 [phone]

## 2012-03-19 NOTE — ED Provider Notes (Addendum)
History     CSN: [id number]  Arrival date & time 03/19/12  1132   First MD Initiated Contact with Patient 03/19/12 1148      Chief Complaint  Patient presents with  . Facial Pain    (Consider location/radiation/quality/duration/timing/severity/associated sxs/prior treatment) HPI Comments: Patient presents to urgent care complaining of about 10 days of facial pain and congestion with postnasal dripping some cough and sporadic sneezing. Describes his secretions this yellow, and describes that his sinus pressure is increasing in intensity and discomfort. He also describes that his nose especially the left nostril seem to be sensitive and tender at touch. Patient denies any fevers, chills, or shortness of breath. Patient describes the pressure and points to both maxillary regions. Describes the pain gets much worse when he leans forward  The history is provided by the patient.    Past Medical History  Diagnosis Date  . Hypertension   . HIV (human immunodeficiency virus infection)   . Immune deficiency disorder     History reviewed. No pertinent past surgical history.  History reviewed. No pertinent family history.  History  Substance Use Topics  . Smoking status: Current Everyday Smoker -- 0.3 packs/day for 23 years    Types: Cigarettes  . Smokeless tobacco: Never Used  . Alcohol Use: No      Review of Systems  Constitutional: Negative for fever, chills, appetite change and fatigue.  HENT: Positive for congestion, rhinorrhea, sneezing, postnasal drip and sinus pressure. Negative for sore throat, facial swelling, trouble swallowing, neck pain, dental problem and voice change.   Respiratory: Positive for cough. Negative for shortness of breath and stridor.     Allergies  Ibuprofen  Home Medications   Current Outpatient Rx  Name Route Sig Dispense Refill  . ALBUTEROL SULFATE HFA 108 (90 BASE) MCG/ACT IN AERS Inhalation Inhale 2 puffs into the lungs every 6 (six) hours  as needed. Shortness of breath    . DOXYCYCLINE HYCLATE 100 MG PO CAPS Oral Take 100 mg by mouth 2 (two) times daily.    . EFAVIRENZ-EMTRICITAB-TENOFOVIR 600-200-300 MG PO TABS Oral Take 1 tablet by mouth at bedtime. 30 tablet 11  . HYDROCODONE-ACETAMINOPHEN 5-325 MG PO TABS  Take 1-2 tablets every 6 hours as needed for severe pain 12 tablet 0  . OXYMETAZOLINE HCL 0.05 % NA SOLN Nasal Place 2 sprays into the nose 2 (two) times daily. Do not use for more than 3 days. 30 mL 0    BP 148/96  Pulse 75  Temp 98.4 F (36.9 C) (Oral)  Resp 18  SpO2 98%  Physical Exam  HENT:  Head: Normocephalic.    Nose: Mucosal edema present. Right sinus exhibits maxillary sinus tenderness. Left sinus exhibits maxillary sinus tenderness.    Pulmonary/Chest: Effort normal and breath sounds normal. He has no decreased breath sounds.    ED Course  Procedures (including critical care time)  Labs Reviewed - No data to display No results found.   1. Sinusitis   2. Rhinitis       MDM  Patient with maxillary sinusitis symptoms and predominantly left-sided tinnitus. Patient was prescribed a course of doxycycline along with an antihistamine with decongestant structure cycle for 2 weeks with topical nasal Mupirocin.         Jimmie Molly, MD 03/19/12 2121  Jimmie Molly, MD 03/19/12 2121

## 2012-03-20 ENCOUNTER — Encounter (HOSPITAL_COMMUNITY): Payer: Self-pay | Admitting: *Deleted

## 2012-04-15 ENCOUNTER — Ambulatory Visit: Payer: Self-pay | Admitting: Internal Medicine

## 2012-04-24 ENCOUNTER — Telehealth: Payer: Self-pay | Admitting: *Deleted

## 2012-04-24 ENCOUNTER — Ambulatory Visit: Payer: Self-pay | Admitting: Internal Medicine

## 2012-04-24 NOTE — Telephone Encounter (Signed)
Message left for pt to call for new appt w/ Dr. Orvan Falconer.

## 2012-05-01 ENCOUNTER — Ambulatory Visit (INDEPENDENT_AMBULATORY_CARE_PROVIDER_SITE_OTHER): Payer: Self-pay | Admitting: Internal Medicine

## 2012-05-01 ENCOUNTER — Encounter: Payer: Self-pay | Admitting: Internal Medicine

## 2012-05-01 VITALS — BP 132/89 | HR 102 | Temp 97.8°F | Ht 66.0 in | Wt 216.5 lb

## 2012-05-01 DIAGNOSIS — B20 Human immunodeficiency virus [HIV] disease: Secondary | ICD-10-CM

## 2012-05-01 DIAGNOSIS — Z23 Encounter for immunization: Secondary | ICD-10-CM

## 2012-05-01 NOTE — Progress Notes (Signed)
Patient ID: Drew Cuevas, male   DOB: 04-Dec-1970, 41 y.o.   MRN: [id number]     [facility] for Infectious Disease  Patient Active Problem List  Diagnosis  . HIV DISEASE  . DEPRESSION, ACUTE  . Essential hypertension, benign  . INTERNAL HEMORRHOIDS WITHOUT MENTION COMP  . Recurrent boils  . Right knee pain  . Diarrhea    Patient's Medications  New Prescriptions   No medications on file  Previous Medications   ALBUTEROL (PROVENTIL HFA;VENTOLIN HFA) 108 (90 BASE) MCG/ACT INHALER    Inhale 2 puffs into the lungs every 6 (six) hours as needed. Shortness of breath   EFAVIRENZ-EMTRICITABINE-TENOFOVIR (ATRIPLA) 600-200-300 MG PER TABLET    Take 1 tablet by mouth at bedtime.  Modified Medications   No medications on file  Discontinued Medications   No medications on file    Subjective: Drew Cuevas is in for his routine visit. As usual, he has not missed any doses of his Atripla. He states that he is feeling better. He is not as depressed. He attributes his improvement to smoking refer which he says helps his mood. He is also started back to regular exercise doing the insanity workouts from a DVD. He still smoking about 4 cigarettes daily. He has the information about the [place] quit line that I gave him but never called them. He states that he is interested in quitting.  Objective: Temp: 97.8 F (36.6 C) (10/03 1354) Temp src: Oral (10/03 1354) BP: 132/89 mmHg (10/03 1354) Pulse Rate: 102  (10/03 1354)  General: he appears to be in better spirits Skin: no rash, multiple tattoos Lungs: clear Cor: regular S1 and S2 and no murmurs Abdomen: nontender  Lab Results HIV 1 RNA Quant (copies/mL)  Date Value  02/19/2012 <20   11/07/2011 <20   06/12/2011 NOT DETECTED      CD4 T Cell Abs (cmm)  Date Value  02/19/2012 680   11/07/2011 720   06/12/2011 740      Assessment: His HIV remains under excellent control. I will continue Atripla.  His depression seems to be under  better control. I encouraged him to continue regular exercise.  His blood pressure remains modestly elevated. I encouraged him to continue regular exercise.  I encouraged him to call the [place] quit line today and try to get their help devising a plan to quit.  Plan: 1. Continue Atripla 2. Continue regular exercise 3. Cigarette cessation counseling 4. Monitor blood pressure 5. Followup after lab work in 6 months   Cliffton Asters, MD [facility] for Infectious Disease [facility] [phone] pager   [phone] cell 05/01/2012, 2:15 PM

## 2012-05-05 ENCOUNTER — Ambulatory Visit: Payer: Self-pay

## 2012-05-05 NOTE — Progress Notes (Signed)
HIV CLINIC VISIT  RFV : rash Subjective:    Patient ID: Drew Cuevas, male    DOB: 24-Jul-1971, 41 y.o.   MRN: [id number]  [facility] Male with HIV, CD 4 count of 720/ VL <20 on atripla. He reports having rash developing in sun exposed areas for the last 2 wks. Went to urgent care on 5/15. He states that he thought that the rash started on neck roughly 4 wks ago, then arms nadn now bilateral forearms. Somewhat pruritic. No new detergent, no new clothing that could account for this. No exposure to plants/hiking.  Current Outpatient Prescriptions on File Prior to Visit  Medication Sig Dispense Refill  . albuterol (PROVENTIL HFA;VENTOLIN HFA) 108 (90 BASE) MCG/ACT inhaler Inhale 2 puffs into the lungs every 6 (six) hours as needed. Shortness of breath       Active Ambulatory Problems    Diagnosis Date Noted  . HIV DISEASE 08/28/2006  . DEPRESSION, ACUTE 12/20/2006  . Essential hypertension, benign 04/28/2008  . INTERNAL HEMORRHOIDS WITHOUT MENTION COMP 12/01/2008  . Recurrent boils 06/10/2009  . Right knee pain 04/30/2011  . Diarrhea 11/07/2011   Resolved Ambulatory Problems    Diagnosis Date Noted  . CANDIDIASIS, RECTAL 03/21/2007  . HEADACHE, TENSION 10/22/2006  . PHARYNGITIS, ACUTE 07/08/2007  . RECTAL PAIN 12/21/2008  . Carbuncle and furuncle of face 01/18/2010  . WRIST PAIN, RIGHT 11/09/2009  . KNEE PAIN, BILATERAL 03/03/2008  . CHEST PAIN, ATYPICAL 12/20/2006  . RAD (reactive airway disease) 12/12/2010  . Rash 04/30/2011   Past Medical History  Diagnosis Date  . Hypertension   . HIV (human immunodeficiency virus infection)   . Immune deficiency disorder        Review of Systems Review of Systems  Constitutional: Negative for fever, chills, diaphoresis, activity change, appetite change, fatigue and unexpected weight change.  HENT: Negative for congestion, sore throat, rhinorrhea, sneezing, trouble swallowing and sinus pressure.  Eyes: Negative for photophobia and visual  disturbance.  Respiratory: Negative for cough, chest tightness, shortness of breath, wheezing and stridor.  Cardiovascular: Negative for chest pain, palpitations and leg swelling.  Gastrointestinal: Negative for nausea, vomiting, abdominal pain, diarrhea, constipation, blood in stool, abdominal distention and anal bleeding.  Genitourinary: Negative for dysuria, hematuria, flank pain and difficulty urinating.  Musculoskeletal: Negative for myalgias, back pain, joint swelling, arthralgias and gait problem.  Skin: per hpi Neurological: Negative for dizziness, tremors, weakness and light-headedness.  Hematological: Negative for adenopathy. Does not bruise/bleed easily.  Psychiatric/Behavioral: Negative for behavioral problems, confusion, sleep disturbance, dysphoric mood, decreased concentration and agitation.       Objective:   Physical Exam  BP 152/99  Pulse 89  Temp 98.3 F (36.8 C) (Oral)  Wt 223 lb (101.152 kg) Physical Exam  Constitutional: He is oriented to person, place, and time. He appears well-developed and well-nourished. No distress.  HENT:  Mouth/Throat: Oropharynx is clear and moist. No oropharyngeal exudate.  Cardiovascular: Normal rate, regular rhythm and normal heart sounds. Exam reveals no gallop and no friction rub.  No murmur heard.  Pulmonary/Chest: Effort normal and breath sounds normal. No respiratory distress. He has no wheezes.  Abdominal: Soft. Bowel sounds are normal. He exhibits no distension. There is no tenderness.  Lymphadenopathy:  He has no cervical adenopathy.  Neurological: He is alert and oriented to person, place, and time.  Skin: Skin is warm and dry. Areas of hypopigmentation macular coalesced predominantlh seen on arms bilaterally  Psychiatric: He has a normal mood and affect. His behavior  is normal.         Assessment & Plan:  Rash=  Appears to be c/w photosensitivity dermatitis? Possibly tinea vesicolor can try an OTC steroid cream,  hydrocortisone 1% for starters to affected area to see if that helps his symptoms; have arms covered to see if no further sun exposure helps his rash. If not improved, will refer to derm  hiv = continue with [place]

## 2012-05-12 ENCOUNTER — Ambulatory Visit (INDEPENDENT_AMBULATORY_CARE_PROVIDER_SITE_OTHER): Payer: Self-pay | Admitting: *Deleted

## 2012-05-12 DIAGNOSIS — B20 Human immunodeficiency virus [HIV] disease: Secondary | ICD-10-CM

## 2012-05-15 ENCOUNTER — Encounter: Payer: Self-pay | Admitting: Internal Medicine

## 2012-05-22 ENCOUNTER — Telehealth: Payer: Self-pay | Admitting: Internal Medicine

## 2012-05-23 ENCOUNTER — Telehealth: Payer: Self-pay | Admitting: *Deleted

## 2012-05-23 NOTE — Telephone Encounter (Signed)
error 

## 2012-05-23 NOTE — Telephone Encounter (Signed)
Patient sent email c/o elevated B/P of 155/111, also low sugar.  Forwarded to Dr. Orvan Falconer, but unsure if he will be able to see.  Emailed pt back asking why he is checking sugars as he is not diabetic and how low his readings have been.  Please advise regarding B/P. Also asked patient if he has been exercising as recommended by MD, awaiting response. Called patient this AM but he has not returned call. Drew Cuevas CMA

## 2012-05-26 ENCOUNTER — Ambulatory Visit (INDEPENDENT_AMBULATORY_CARE_PROVIDER_SITE_OTHER): Payer: Self-pay | Admitting: Internal Medicine

## 2012-05-26 ENCOUNTER — Encounter: Payer: Self-pay | Admitting: Internal Medicine

## 2012-05-26 VITALS — BP 124/85 | HR 93 | Temp 98.4°F | Wt 220.0 lb

## 2012-05-26 DIAGNOSIS — B20 Human immunodeficiency virus [HIV] disease: Secondary | ICD-10-CM

## 2012-05-26 DIAGNOSIS — I10 Essential (primary) hypertension: Secondary | ICD-10-CM

## 2012-05-26 DIAGNOSIS — E669 Obesity, unspecified: Secondary | ICD-10-CM | POA: Insufficient documentation

## 2012-05-26 NOTE — Progress Notes (Signed)
Patient ID: Drew Cuevas, male   DOB: October 26, 1970, 41 y.o.   MRN: [id number]     [facility] for Infectious Disease  Patient Active Problem List  Diagnosis  . HIV DISEASE  . DEPRESSION, ACUTE  . Essential hypertension, benign  . INTERNAL HEMORRHOIDS WITHOUT MENTION COMP  . Recurrent boils  . Right knee pain  . Diarrhea    Patient's Medications  New Prescriptions   No medications on file  Previous Medications   ALBUTEROL (PROVENTIL HFA;VENTOLIN HFA) 108 (90 BASE) MCG/ACT INHALER    Inhale 2 puffs into the lungs every 6 (six) hours as needed. Shortness of breath   EFAVIRENZ-EMTRICITABINE-TENOFOVIR (ATRIPLA) 600-200-300 MG PER TABLET    Take 1 tablet by mouth at bedtime.  Modified Medications   No medications on file  Discontinued Medications   No medications on file    Subjective: Ori is in for a work in visit. He has been checking his blood pressure and blood sugar intermittently since his last visit. 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 He states that he checks his blood pressure about twice a week and yesterday it was quite elevated. He did not feel any worse than usual. He states that he is also found his blood sugar to occasionally be in the high 60s he has felt well. He was able to quit smoking cigarettes 3 weeks ago. He wants to lose weight but has no current plan.  Objective: Temp: 98.4 F (36.9 C) (10/28 1329) Temp src: Oral (10/28 1329) BP: 124/85 mmHg (10/28 1329) Pulse Rate: 93  (10/28 1329)  General: his body mass index is just over 35 Skin: no rash Lungs: clear Cor: regular S1 and S2 no murmurs Abdomen: obese, soft nontender  Lab Results HIV 1 RNA Quant (copies/mL)  Date Value  02/19/2012 <20   11/07/2011 <20   06/12/2011 NOT DETECTED      CD4 T Cell Abs (cmm)  Date Value  02/19/2012 680   11/07/2011 720   06/12/2011 740      Assessment: His HIV infection remains under excellent control. I will continue his current  regimen.  I talked to him at length about lifestyle modification to help him lose weight and is a stable fashion.  He has borderline hypertension. He will continue to work on lifestyle modification rather than restart hydrochlorothiazide that he took when he was incarcerated several years ago.  Plan: 1. Continue current antiretroviral regimen 2. Lifestyle modification 3. Followup in 6 months   Cliffton Asters, MD [facility] for Infectious Disease [facility] [phone] pager   [phone] cell 05/26/2012, 2:07 PM

## 2012-05-26 NOTE — Telephone Encounter (Signed)
His blood pressure has not been that high on recent visits. If he would like, please schedule him for a followup visit with me in the near future. Also, I do not know a reason why he should be checking his blood sugars.

## 2012-05-28 ENCOUNTER — Emergency Department (HOSPITAL_COMMUNITY): Payer: Self-pay

## 2012-05-28 ENCOUNTER — Encounter (HOSPITAL_COMMUNITY): Payer: Self-pay

## 2012-05-28 ENCOUNTER — Emergency Department (HOSPITAL_COMMUNITY)
Admission: EM | Admit: 2012-05-28 | Discharge: 2012-05-28 | Disposition: A | Discharge status: Home / Self Care | Payer: Self-pay | Attending: Emergency Medicine | Admitting: Emergency Medicine

## 2012-05-28 DIAGNOSIS — Z79899 Other long term (current) drug therapy: Secondary | ICD-10-CM | POA: Insufficient documentation

## 2012-05-28 DIAGNOSIS — R059 Cough, unspecified: Secondary | ICD-10-CM | POA: Insufficient documentation

## 2012-05-28 DIAGNOSIS — R0602 Shortness of breath: Secondary | ICD-10-CM | POA: Insufficient documentation

## 2012-05-28 DIAGNOSIS — J020 Streptococcal pharyngitis: Secondary | ICD-10-CM | POA: Insufficient documentation

## 2012-05-28 DIAGNOSIS — I10 Essential (primary) hypertension: Secondary | ICD-10-CM | POA: Insufficient documentation

## 2012-05-28 DIAGNOSIS — K859 Acute pancreatitis without necrosis or infection, unspecified: Secondary | ICD-10-CM | POA: Insufficient documentation

## 2012-05-28 DIAGNOSIS — R0982 Postnasal drip: Secondary | ICD-10-CM | POA: Insufficient documentation

## 2012-05-28 DIAGNOSIS — D849 Immunodeficiency, unspecified: Secondary | ICD-10-CM | POA: Insufficient documentation

## 2012-05-28 DIAGNOSIS — R52 Pain, unspecified: Secondary | ICD-10-CM | POA: Insufficient documentation

## 2012-05-28 DIAGNOSIS — B20 Human immunodeficiency virus [HIV] disease: Secondary | ICD-10-CM | POA: Insufficient documentation

## 2012-05-28 DIAGNOSIS — H9209 Otalgia, unspecified ear: Secondary | ICD-10-CM | POA: Insufficient documentation

## 2012-05-28 DIAGNOSIS — H6690 Otitis media, unspecified, unspecified ear: Secondary | ICD-10-CM | POA: Insufficient documentation

## 2012-05-28 DIAGNOSIS — Z87891 Personal history of nicotine dependence: Secondary | ICD-10-CM | POA: Insufficient documentation

## 2012-05-28 DIAGNOSIS — R05 Cough: Secondary | ICD-10-CM | POA: Insufficient documentation

## 2012-05-28 LAB — CBC WITH DIFFERENTIAL/PLATELET
Basophils Absolute: 0.1 10*3/uL (ref 0.0–0.1)
Basophils Relative: 0 % (ref 0–1)
Eosinophils Relative: 2 % (ref 0–5)
Lymphocytes Relative: 19 % (ref 12–46)
Neutro Abs: 8.4 10*3/uL — ABNORMAL HIGH (ref 1.7–7.7)
Platelets: 163 10*3/uL (ref 150–400)
RDW: 13.1 % (ref 11.5–15.5)
WBC: 11.8 10*3/uL — ABNORMAL HIGH (ref 4.0–10.5)

## 2012-05-28 LAB — COMPREHENSIVE METABOLIC PANEL
ALT: 46 U/L (ref 0–53)
AST: 35 U/L (ref 0–37)
Albumin: 3.3 g/dL — ABNORMAL LOW (ref 3.5–5.2)
CO2: 24 mEq/L (ref 19–32)
Calcium: 8.7 mg/dL (ref 8.4–10.5)
Chloride: 108 mEq/L (ref 96–112)
GFR calc non Af Amer: 65 mL/min — ABNORMAL LOW (ref >90)
Sodium: 139 mEq/L (ref 135–145)

## 2012-05-28 LAB — URINALYSIS, ROUTINE W REFLEX MICROSCOPIC
Glucose, UA: NEGATIVE mg/dL
Hgb urine dipstick: NEGATIVE
Leukocytes, UA: NEGATIVE
Protein, ur: NEGATIVE mg/dL
pH: 7 (ref 5.0–8.0)

## 2012-05-28 LAB — RAPID STREP SCREEN (MED CTR MEBANE ONLY): Streptococcus, Group A Screen (Direct): POSITIVE — AB

## 2012-05-28 MED ORDER — HYDROCODONE-ACETAMINOPHEN 5-325 MG PO TABS: 2.0000 | ORAL_TABLET | ORAL | Status: DC | PRN

## 2012-05-28 MED ORDER — SODIUM CHLORIDE 0.9 % IV BOLUS (SEPSIS)
1000.0000 mL | Freq: Once | INTRAVENOUS | Status: AC
  Administered 2012-05-28: 1000 mL via INTRAVENOUS

## 2012-05-28 MED ORDER — DEXTROSE 5 % IV SOLN
1.0000 g | Freq: Once | INTRAVENOUS | Status: AC
  Administered 2012-05-28: 1 g via INTRAVENOUS
  Filled 2012-05-28: qty 10

## 2012-05-28 MED ORDER — ONDANSETRON HCL 4 MG/2ML IJ SOLN: 4.0000 mg | Freq: Once | INTRAMUSCULAR | Status: DC

## 2012-05-28 MED ORDER — ONDANSETRON HCL 4 MG/2ML IJ SOLN
4.0000 mg | Freq: Once | INTRAMUSCULAR | Status: AC
  Administered 2012-05-28: 4 mg via INTRAVENOUS
  Filled 2012-05-28: qty 2

## 2012-05-28 MED ORDER — AMOXICILLIN 500 MG PO CAPS: 500.0000 mg | ORAL_CAPSULE | Freq: Three times a day (TID) | ORAL | Status: DC

## 2012-05-28 MED ORDER — PROMETHAZINE HCL 25 MG PO TABS: 25.0000 mg | ORAL_TABLET | Freq: Four times a day (QID) | ORAL | Status: DC | PRN

## 2012-05-28 NOTE — ED Provider Notes (Signed)
Medical screening examination/treatment/procedure(s) were performed by non-physician practitioner and as supervising physician I was immediately available for consultation/collaboration.  Gerhard Munch, MD 05/28/12 1606

## 2012-05-28 NOTE — ED Notes (Signed)
Pt denies nausea.

## 2012-05-28 NOTE — ED Provider Notes (Signed)
History     CSN: [id number]  Arrival date & time 05/28/12  5621   First MD Initiated Contact with Patient 05/28/12 1010      Chief Complaint  Patient presents with  . Sore Throat  . Generalized Body Aches    (Consider location/radiation/quality/duration/timing/severity/associated sxs/prior treatment) Patient is a 41 y.o. male presenting with pharyngitis. The history is provided by the patient and medical records.  Sore Throat Associated symptoms include arthralgias, coughing, headaches, nausea, a sore throat and vomiting. Pertinent negatives include no abdominal pain, chest pain, congestion, diaphoresis, fatigue, fever, neck pain or rash.    Lealand Elting is a 41 y.o. male presents to the emergency department complaining of sore throat. He has associated nausea, vomiting, coughing, mild shortness of breath, headache, arthralgias.  Symptoms began gradually, have been intermittent, gradually worsened.  Pt denies fever, chills, chest pain, abdominal pain (pt reports soreness from vomiting), weakness, dizziness, syncope. Pt states symptoms began Sunday evening, were better on Monday, but began again Monday evening.  Pt states symptoms are worse at night.  Nothing makes the symptoms better, eating makes the symptoms worse.  Past Medical History  Diagnosis Date  . Hypertension   . HIV (human immunodeficiency virus infection)   . Immune deficiency disorder     No past surgical history on file.  No family history on file.  History  Substance Use Topics  . Smoking status: Former Smoker    Types: Cigarettes    Quit date: 05/05/2012  . Smokeless tobacco: Never Used  . Alcohol Use: No      Review of Systems  Constitutional: Negative for fever, diaphoresis, appetite change, fatigue and unexpected weight change.  HENT: Positive for ear pain, sore throat and postnasal drip. Negative for nosebleeds, congestion, mouth sores, trouble swallowing, neck pain, neck stiffness and sinus  pressure.   Eyes: Negative for visual disturbance.  Respiratory: Positive for cough and shortness of breath. Negative for chest tightness and wheezing.   Cardiovascular: Negative for chest pain.  Gastrointestinal: Positive for nausea, vomiting and diarrhea. Negative for abdominal pain and constipation.  Genitourinary: Negative for dysuria, urgency, frequency and hematuria.  Musculoskeletal: Positive for arthralgias.  Skin: Negative for rash.  Neurological: Positive for headaches. Negative for syncope and light-headedness.  Psychiatric/Behavioral: Negative for disturbed wake/sleep cycle. The patient is not nervous/anxious.   All other systems reviewed and are negative.    Allergies  Ibuprofen  Home Medications   Current Outpatient Rx  Name Route Sig Dispense Refill  . ACETAMINOPHEN 500 MG PO TABS Oral Take 1,000 mg by mouth every 6 (six) hours as needed. For pain    . ALBUTEROL SULFATE HFA 108 (90 BASE) MCG/ACT IN AERS Inhalation Inhale 2 puffs into the lungs every 6 (six) hours as needed. Shortness of breath    . EFAVIRENZ-EMTRICITAB-TENOFOVIR 600-200-300 MG PO TABS Oral Take 1 tablet by mouth at bedtime. 30 tablet 11  . AMOXICILLIN 500 MG PO CAPS Oral Take 1 capsule (500 mg total) by mouth 3 (three) times daily. 30 capsule 0  . HYDROCODONE-ACETAMINOPHEN 5-325 MG PO TABS Oral Take 2 tablets by mouth every 4 (four) hours as needed for pain. 6 tablet 0  . PROMETHAZINE HCL 25 MG PO TABS Oral Take 1 tablet (25 mg total) by mouth every 6 (six) hours as needed for nausea. 12 tablet 0    BP 132/88  Pulse 95  Temp 99 F (37.2 C) (Oral)  Resp 22  SpO2 99%  Physical Exam  Nursing note  and vitals reviewed. Constitutional: He is oriented to person, place, and time. He appears well-developed and well-nourished. No distress.  HENT:  Head: Normocephalic and atraumatic.  Right Ear: External ear normal. Tympanic membrane is injected and bulging. A middle ear effusion is present.  Left Ear:  Tympanic membrane, external ear and ear canal normal.  Nose: Nose normal. Right sinus exhibits no maxillary sinus tenderness and no frontal sinus tenderness. Left sinus exhibits no maxillary sinus tenderness and no frontal sinus tenderness.  Mouth/Throat: Uvula is midline and mucous membranes are normal. Mucous membranes are not cyanotic. Oropharyngeal exudate and posterior oropharyngeal erythema present. No tonsillar abscesses.  Eyes: Conjunctivae normal are normal. Pupils are equal, round, and reactive to light. No scleral icterus.  Neck: Normal range of motion. Neck supple.  Cardiovascular: Regular rhythm and intact distal pulses.  Tachycardia present.  Exam reveals no gallop and no friction rub.   No murmur heard. Pulses:      Radial pulses are 2+ on the right side, and 2+ on the left side.       Dorsalis pedis pulses are 2+ on the right side, and 2+ on the left side.       Posterior tibial pulses are 2+ on the right side, and 2+ on the left side.  Pulmonary/Chest: Effort normal. No respiratory distress. He has decreased breath sounds (throughout). He has no wheezes. He has no rales. He exhibits no tenderness.  Abdominal: Soft. Bowel sounds are normal. He exhibits no mass. There is tenderness (General, mild) in the epigastric area. There is no rebound and no guarding.  Musculoskeletal: Normal range of motion. He exhibits no edema.  Neurological: He is alert and oriented to person, place, and time. He exhibits normal muscle tone. Coordination normal.       Speech is clear and goal oriented Moves extremities without ataxia  Skin: Skin is warm and dry. He is not diaphoretic.  Psychiatric: He has a normal mood and affect.    ED Course  Procedures (including critical care time)  Labs Reviewed  RAPID STREP SCREEN - Abnormal; Notable for the following:    Streptococcus, Group A Screen (Direct) POSITIVE (*)     All other components within normal limits  CBC WITH DIFFERENTIAL - Abnormal;  Notable for the following:    WBC 11.8 (*)     Neutro Abs 8.4 (*)     All other components within normal limits  COMPREHENSIVE METABOLIC PANEL - Abnormal; Notable for the following:    Albumin 3.3 (*)     GFR calc non Af Amer 65 (*)     GFR calc Af Amer 75 (*)     All other components within normal limits  LIPASE, BLOOD - Abnormal; Notable for the following:    Lipase 115 (*)     All other components within normal limits  URINALYSIS, ROUTINE W REFLEX MICROSCOPIC   Dg Chest 2 View  05/28/2012  *RADIOLOGY REPORT*  Clinical Data: And cough, shortness of breath, dizziness.  CHEST - 2 VIEW  Comparison: 02/04/2012  Findings: Heart and mediastinal contours are within normal limits. No focal opacities or effusions.  No acute bony abnormality.  IMPRESSION: No active cardiopulmonary disease.   Original Report Authenticated By: Cyndie Chime, M.D.    Results for orders placed during the hospital encounter of 05/28/12  RAPID STREP SCREEN      Component Value Range   Streptococcus, Group A Screen (Direct) POSITIVE (*) NEGATIVE  CBC WITH DIFFERENTIAL  Component Value Range   WBC 11.8 (*) 4.0 - 10.5 K/uL   RBC 4.36  4.22 - 5.81 MIL/uL   Hemoglobin 14.3  13.0 - 17.0 g/dL   HCT 16.1  09.6 - 04.5 %   MCV 93.3  78.0 - 100.0 fL   MCH 32.8  26.0 - 34.0 pg   MCHC 35.1  30.0 - 36.0 g/dL   RDW 40.9  81.1 - 91.4 %   Platelets 163  150 - 400 K/uL   Neutrophils Relative 72  43 - 77 %   Neutro Abs 8.4 (*) 1.7 - 7.7 K/uL   Lymphocytes Relative 19  12 - 46 %   Lymphs Abs 2.2  0.7 - 4.0 K/uL   Monocytes Relative 7  3 - 12 %   Monocytes Absolute 0.8  0.1 - 1.0 K/uL   Eosinophils Relative 2  0 - 5 %   Eosinophils Absolute 0.3  0.0 - 0.7 K/uL   Basophils Relative 0  0 - 1 %   Basophils Absolute 0.1  0.0 - 0.1 K/uL  COMPREHENSIVE METABOLIC PANEL      Component Value Range   Sodium 139  135 - 145 mEq/L   Potassium 4.2  3.5 - 5.1 mEq/L   Chloride 108  96 - 112 mEq/L   CO2 24  19 - 32 mEq/L    Glucose, Bld 88  70 - 99 mg/dL   BUN 13  6 - 23 mg/dL   Creatinine, Ser 7.82  0.50 - 1.35 mg/dL   Calcium 8.7  8.4 - 95.6 mg/dL   Total Protein 6.9  6.0 - 8.3 g/dL   Albumin 3.3 (*) 3.5 - 5.2 g/dL   AST 35  0 - 37 U/L   ALT 46  0 - 53 U/L   Alkaline Phosphatase 103  39 - 117 U/L   Total Bilirubin 0.3  0.3 - 1.2 mg/dL   GFR calc non Af Amer 65 (*) >90 mL/min   GFR calc Af Amer 75 (*) >90 mL/min  LIPASE, BLOOD      Component Value Range   Lipase 115 (*) 11 - 59 U/L  URINALYSIS, ROUTINE W REFLEX MICROSCOPIC      Component Value Range   Color, Urine YELLOW  YELLOW   APPearance CLEAR  CLEAR   Specific Gravity, Urine 1.009  1.005 - 1.030   pH 7.0  5.0 - 8.0   Glucose, UA NEGATIVE  NEGATIVE mg/dL   Hgb urine dipstick NEGATIVE  NEGATIVE   Bilirubin Urine NEGATIVE  NEGATIVE   Ketones, ur NEGATIVE  NEGATIVE mg/dL   Protein, ur NEGATIVE  NEGATIVE mg/dL   Urobilinogen, UA 0.2  0.0 - 1.0 mg/dL   Nitrite NEGATIVE  NEGATIVE   Leukocytes, UA NEGATIVE  NEGATIVE   Dg Chest 2 View  05/28/2012  *RADIOLOGY REPORT*  Clinical Data: And cough, shortness of breath, dizziness.  CHEST - 2 VIEW  Comparison: 02/04/2012  Findings: Heart and mediastinal contours are within normal limits. No focal opacities or effusions.  No acute bony abnormality.  IMPRESSION: No active cardiopulmonary disease.   Original Report Authenticated By: Cyndie Chime, M.D.     1. Otitis media   2. Strep throat   3. Pancreatitis   4. HIV DISEASE       MDM  [name] presents with URI symptoms.  Concern for strep throat with tonsillar exudate and Otitis media of the R ear based on exam.  Concern for pancreatitis versus gallbladder stones  with epigastric tenderness.  Patient elevated lipase but normal AST and ALT making cholecystitis unlikely.  Chest x-ray without evidence of infiltrate, Patient given Rocephin 1 g here in the emergency department for coverage of otitis media and strep throat. Will DC home with amoxicillin. I  discussed these findings with the patient. Also recommended followup with primary care physician this week for discussion of pancreatitis.  I have also discussed reasons to return immediately to the ER.  Patient expresses understanding and agrees with plan.  1. Medications: Norco, Phenergan, amoxicillin 2. Treatment: Rest, drink plenty of fluids, clear liquid diet and progressed slowly 3. Follow Up: With primary care physician this week for resolution of pancreatitis        Dierdre Forth, PA-C 05/28/12 1454

## 2012-05-28 NOTE — ED Notes (Signed)
Pt c/o slight nausea.

## 2012-05-28 NOTE — ED Notes (Signed)
Pt requesting something to eat. Given ginger ale and saltine crackers.

## 2012-05-28 NOTE — ED Notes (Signed)
Pt complains of sorethroat, headache, and body aches. Onset 2 days ago.

## 2012-07-02 ENCOUNTER — Encounter (HOSPITAL_COMMUNITY): Payer: Self-pay | Admitting: *Deleted

## 2012-07-02 ENCOUNTER — Emergency Department (INDEPENDENT_AMBULATORY_CARE_PROVIDER_SITE_OTHER)
Admission: EM | Admit: 2012-07-02 | Discharge: 2012-07-02 | Disposition: A | Discharge status: Home / Self Care | Payer: No Typology Code available for payment source | Source: Home / Self Care | Attending: Emergency Medicine | Admitting: Emergency Medicine

## 2012-07-02 DIAGNOSIS — J069 Acute upper respiratory infection, unspecified: Secondary | ICD-10-CM

## 2012-07-02 DIAGNOSIS — J019 Acute sinusitis, unspecified: Secondary | ICD-10-CM

## 2012-07-02 MED ORDER — DIPHENOXYLATE-ATROPINE 2.5-0.025 MG PO TABS: 1.0000 | ORAL_TABLET | Freq: Four times a day (QID) | ORAL | Status: DC | PRN

## 2012-07-02 MED ORDER — ONDANSETRON 8 MG PO TBDP: 8.0000 mg | ORAL_TABLET | Freq: Three times a day (TID) | ORAL | Status: DC | PRN

## 2012-07-02 MED ORDER — AMOXICILLIN 500 MG PO CAPS: 500.0000 mg | ORAL_CAPSULE | Freq: Three times a day (TID) | ORAL | Status: DC

## 2012-07-02 NOTE — ED Provider Notes (Signed)
Chief Complaint  Patient presents with  . Facial Pain    History of Present Illness:   Drew Cuevas is a 41 year old HIV-positive male who has had a history of frequent episodes of sinusitis in the past. For the past 3 days he's had nasal congestion and postnasal drainage which has been yellowish to brown in color. He has had some headache and a cough which is occasionally productive. He denies any fever, chills, or sore throat. He describes pain in his maxillary areas bilaterally. He's also had some diarrhea, nausea, and upper abdominal pain. No vomiting or blood in the stool. He is allergic to ibuprofen. His only medication is Atripla. He is HIV positive and is followed by Dr. Cliffton Asters. His viral loads have been undetectable and his CD4 count ranges in the 6-700 range. He also has hypertension but is not on any treatment for this right now.  Review of Systems:  Other than noted above, the patient denies any of the following symptoms. Systemic:  No fever, chills, sweats, fatigue, myalgias, headache, or anorexia. Eye:  No redness, pain or drainage. ENT:  No earache, ear congestion, nasal congestion, sneezing, rhinorrhea, sinus pressure, sinus pain, post nasal drip, or sore throat. Lungs:  No cough, sputum production, wheezing, shortness of breath, or chest pain. GI:  No abdominal pain, nausea, vomiting, or diarrhea.  PMFSH:  Past medical history, family history, social history, meds, and allergies were reviewed.  Physical Exam:   Vital signs:  BP 132/88  Pulse 72  Temp 98.6 F (37 C) (Oral)  Resp 18  SpO2 100% General:  Alert, in no distress. Eye:  No conjunctival injection or drainage. Lids were normal. ENT:  TMs and canals were normal, without erythema or inflammation.  Nasal mucosa was clear and uncongested, without drainage.  Mucous membranes were moist.  Pharynx was erythematous and swollen, without exudate or drainage.  There were no oral ulcerations or lesions. Neck:  Supple, no  adenopathy, tenderness or mass. Lungs:  No respiratory distress.  Lungs were clear to auscultation, without wheezes, rales or rhonchi.  Breath sounds were clear and equal bilaterally.  Heart:  Regular rhythm, without gallops, murmers or rubs. Abdomen: Soft, flat, nondistended. He has mild tenderness to palpation across the entire upper abdomen without guarding or rebound. No organomegaly or mass. No lower abdominal tenderness to palpation. Bowel sounds are normally active. Skin:  Clear, warm, and dry, without rash or lesions.  Assessment:  The primary encounter diagnosis was Viral upper respiratory infection. A diagnosis of Acute sinusitis was also pertinent to this visit.  Plan:   1.  The following meds were prescribed:   New Prescriptions   AMOXICILLIN (AMOXIL) 500 MG CAPSULE    Take 1 capsule (500 mg total) by mouth 3 (three) times daily.   DIPHENOXYLATE-ATROPINE (LOMOTIL) 2.5-0.025 MG PER TABLET    Take 1 tablet by mouth 4 (four) times daily as needed for diarrhea or loose stools.   ONDANSETRON (ZOFRAN ODT) 8 MG DISINTEGRATING TABLET    Take 1 tablet (8 mg total) by mouth every 8 (eight) hours as needed for nausea.   2.  The patient was instructed in symptomatic care and handouts were given. 3.  The patient was told to return if becoming worse in any way, if no better in 3 or 4 days, and given some red flag symptoms that would indicate earlier return.   [name], MD 07/02/12 1024

## 2012-07-02 NOTE — ED Notes (Signed)
Pt  Reports  Symptoms  Of  Sinus  Congestion  With     Sinus  Drainage           Stuffy  Nose       As  Well  As   Diarrhea   Symptoms  X       2  Days       Pt  Is  Awake  And  Alert  And oriented              Symptoms  Not releived  By  otc meds

## 2012-08-06 ENCOUNTER — Emergency Department (HOSPITAL_COMMUNITY)
Admission: EM | Admit: 2012-08-06 | Discharge: 2012-08-06 | Disposition: A | Discharge status: Home / Self Care | Payer: Self-pay | Attending: Emergency Medicine | Admitting: Emergency Medicine

## 2012-08-06 ENCOUNTER — Encounter (HOSPITAL_COMMUNITY): Payer: Self-pay | Admitting: *Deleted

## 2012-08-06 DIAGNOSIS — L0201 Cutaneous abscess of face: Secondary | ICD-10-CM

## 2012-08-06 DIAGNOSIS — R197 Diarrhea, unspecified: Secondary | ICD-10-CM | POA: Insufficient documentation

## 2012-08-06 DIAGNOSIS — R51 Headache: Secondary | ICD-10-CM | POA: Insufficient documentation

## 2012-08-06 NOTE — ED Provider Notes (Signed)
History     CSN: [id number]  Arrival date & time 08/06/12  1033   First MD Initiated Contact with Patient 08/06/12 1133      Chief Complaint  Patient presents with  . Diarrhea  . Facial Pain    (Consider location/radiation/quality/duration/timing/severity/associated sxs/prior treatment) Patient is a 42 y.o. male presenting with diarrhea. The history is provided by the patient.  Diarrhea The primary symptoms include nausea and diarrhea. Primary symptoms do not include fever, fatigue, abdominal pain, vomiting, melena, hematemesis, hematochezia, dysuria, myalgias, arthralgias or rash. The illness began 3 to 5 days ago. The onset was sudden. The problem has not changed since onset. The illness is also significant for chills. The illness does not include anorexia or constipation. Associated symptoms comments: Pt states that the diarrhea began 2-3 days ago and that he goes about 1-2 times per day.  The stools are loose and without blood or mucous. He does not have abdominal pain, but does have some cramping with the diarrhea.  He did have some tenderness to palpation, but no masses or distention.. Associated medical issues do not include inflammatory bowel disease, GERD, alcohol abuse, gastric bypass or bowel resection. Associated medical issues comments: Pt is HIV positive with an "almost undetectable viral load and a CD4 count that is 600-700".    Past Medical History  Diagnosis Date  . Hypertension   . HIV (human immunodeficiency virus infection)   . Immune deficiency disorder     History reviewed. No pertinent past surgical history.  History reviewed. No pertinent family history.  History  Substance Use Topics  . Smoking status: Former Smoker    Types: Cigarettes    Quit date: 05/05/2012  . Smokeless tobacco: Never Used  . Alcohol Use: No      Review of Systems  Constitutional: Positive for chills. Negative for fever, diaphoresis and fatigue.  HENT: Positive for congestion,  rhinorrhea and postnasal drip. Negative for hearing loss, ear pain, nosebleeds, facial swelling, sneezing, neck pain, neck stiffness and ear discharge.        Pt also had complaint of sinusitis that has been ongoing since early last year.  He has been treated with several rounds of antibiotics, but the infection only clears for a while and then returns.  He states that he doesn't have a lot of mucous or rhinorrhea, but more drainage at night.  He also has a sore on the right nostril that bothers him that appeared on Sunday.  Eyes: Negative for pain, discharge and redness.  Respiratory: Negative for apnea, cough, chest tightness, shortness of breath, wheezing and stridor.   Cardiovascular: Negative for chest pain and palpitations.  Gastrointestinal: Positive for nausea and diarrhea. Negative for vomiting, abdominal pain, constipation, blood in stool, melena, hematochezia, abdominal distention, anorexia and hematemesis.  Genitourinary: Negative for dysuria, urgency, frequency, hematuria and difficulty urinating.  Musculoskeletal: Negative for myalgias and arthralgias.  Skin: Negative for rash.  Neurological: Positive for headaches. Negative for dizziness, syncope, facial asymmetry, weakness, light-headedness and numbness.  Hematological: Negative for adenopathy.  Psychiatric/Behavioral: Negative for behavioral problems, confusion and agitation.    Allergies  Ibuprofen and Penicillins  Home Medications   Current Outpatient Rx  Name  Route  Sig  Dispense  Refill  . ALBUTEROL SULFATE HFA 108 (90 BASE) MCG/ACT IN AERS   Inhalation   Inhale 2 puffs into the lungs every 6 (six) hours as needed. Shortness of breath         . EFAVIRENZ-EMTRICITAB-TENOFOVIR 600-200-300 MG PO  TABS   Oral   Take 1 tablet by mouth at bedtime.   30 tablet   11   . ONDANSETRON 8 MG PO TBDP   Oral   Take 1 tablet (8 mg total) by mouth every 8 (eight) hours as needed for nausea.   20 tablet   0   . PROMETHAZINE  HCL 25 MG PO TABS   Oral   Take 1 tablet (25 mg total) by mouth every 6 (six) hours as needed for nausea.   12 tablet   0     BP 139/84  Pulse 92  Temp 98.2 F (36.8 C) (Oral)  Resp 16  Ht 5\' 6"  (1.676 m)  Wt 225 lb 9.6 oz (102.331 kg)  BMI 36.41 kg/m2  SpO2 99%  Physical Exam  Constitutional: He is oriented to person, place, and time. He appears well-developed and well-nourished. No distress.  HENT:  Head: Normocephalic and atraumatic.  Nose: Nose normal.  Mouth/Throat: Oropharynx is clear and moist.       Turbinates were swollen and erythematous. No rhinorrhea noted on exam.  Sore noted on inside of right nostril.  Eyes: Conjunctivae normal are normal. Right eye exhibits no discharge. Left eye exhibits no discharge.  Neck: Normal range of motion. Neck supple. No thyromegaly present.  Cardiovascular: Normal rate, regular rhythm and normal heart sounds.  Exam reveals no gallop and no friction rub.   No murmur heard. Pulmonary/Chest: Effort normal and breath sounds normal. No stridor. No respiratory distress. He has no wheezes. He has no rales. He exhibits no tenderness.  Abdominal: Soft. Bowel sounds are normal. He exhibits no distension and no mass. There is tenderness. There is guarding. There is no rebound.       Abdomen was tender to palpation with slight guarding.  Lymphadenopathy:    He has no cervical adenopathy.  Neurological: He is alert and oriented to person, place, and time.  Skin: Skin is warm and dry. No rash noted. He is not diaphoretic.  Psychiatric: He has a normal mood and affect. His behavior is normal.    ED Course  Procedures (including critical care time)  Labs Reviewed - No data to display No results found.   No diagnosis found.  1. Intranasal abscess 2. Diarrhea   MDM  The patient left AMA after being called away stating "emergency at home".         Arnoldo Hooker, PA-C 08/14/12 1912

## 2012-08-06 NOTE — ED Notes (Signed)
Pt reports HA, stomach aches and diarrhea since that past weekend. Reports diarrhea x's 3 in past 24hrs. Also states "I think I have a sinus infection, its draining white pus."

## 2012-08-06 NOTE — ED Notes (Signed)
Pt out in hall states he has an emergency at home he can't wait any longer. I encouraged pt to stay but he stated he can't. Pt notified to come back if needed

## 2012-08-08 ENCOUNTER — Telehealth: Payer: Self-pay | Admitting: *Deleted

## 2012-08-08 NOTE — Telephone Encounter (Signed)
Called patient to try and schedule him an office visit for next week but he did not answer phone and not able to leave a message no voicemail available.

## 2012-08-14 ENCOUNTER — Other Ambulatory Visit: Payer: Self-pay | Admitting: Infectious Diseases

## 2012-08-16 NOTE — ED Provider Notes (Signed)
Medical screening examination/treatment/procedure(s) were performed by non-physician practitioner and as supervising physician I was immediately available for consultation/collaboration.   Mckyla Deckman L Leodis Alcocer, MD 08/16/12 0754 

## 2012-09-02 ENCOUNTER — Ambulatory Visit: Payer: Self-pay

## 2012-09-03 ENCOUNTER — Telehealth: Payer: Self-pay | Admitting: *Deleted

## 2012-09-03 MED ORDER — DOXYCYCLINE HYCLATE 100 MG PO TABS: 100.0000 mg | ORAL_TABLET | Freq: Two times a day (BID) | ORAL | Status: AC

## 2012-09-03 NOTE — Telephone Encounter (Signed)
Called patient and advised him that a medication was sent to his pharmacy. Advised him that if he is not feeling better in a few days to call for an appt he may need to seen. Patient advised he understands.

## 2012-09-03 NOTE — Telephone Encounter (Signed)
Patient called and advised he has a boil on his tailbone. He advised it has been there for 2+days and is getting bigger. He advised it is painful and at this time not leaking, he has not had a fever and wants the doctor to call in an antibiotic as he has had this before and knows what it is. Advised him his provider is out of the office and I will have to send him a message. I also asked him if he would be able to come to the office as sometimes they need to see what they are treating prior to giving an antibiotic. He advised he has several appts but due to the pain if necessary he will come in to see a doctor. Advised him will call him back as soon as I get a response from provider. Verified contact info and was given new cell number [phone].

## 2012-09-03 NOTE — Telephone Encounter (Signed)
I can't find one culture from several years ago of a perirectal abscess that grew MSSA. That isolate was also sensitive to doxycycline. I will treat him now with doxycycline in case this boil is due to MRSA.

## 2012-09-03 NOTE — Addendum Note (Signed)
Addended by: Cliffton Asters on: 09/03/2012 11:41 AM   Modules accepted: Orders

## 2012-09-08 IMAGING — CR DG CHEST 2V
2 series · 2 of 2 positions shown · non-contrast
Comparison: 10/24/2011

CLINICAL DATA: Cough, congestion, shortness of breath, loss of
appetite, history smoking, hypertension, HIV

CHEST - 2 VIEW

[w chest pa]
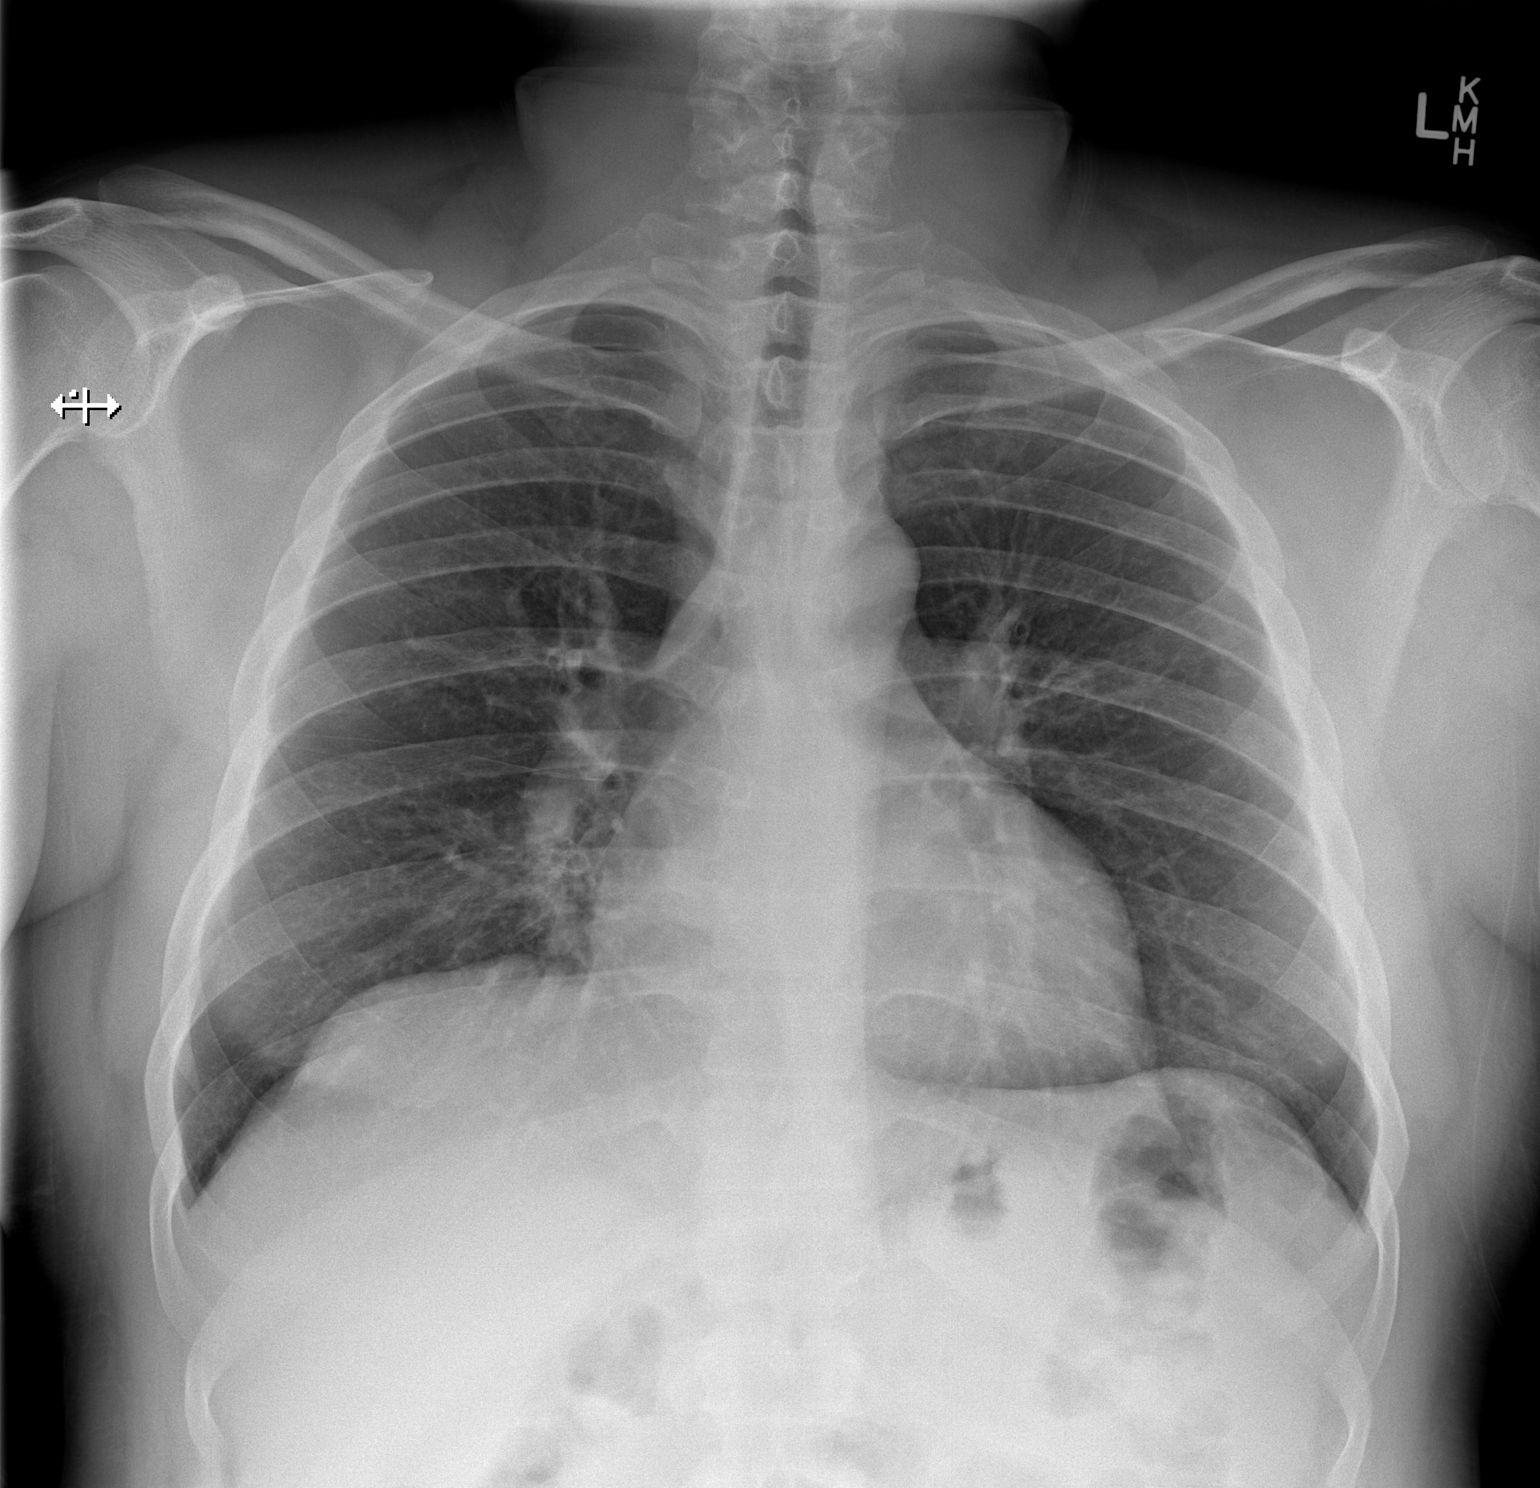

[w chest lat]
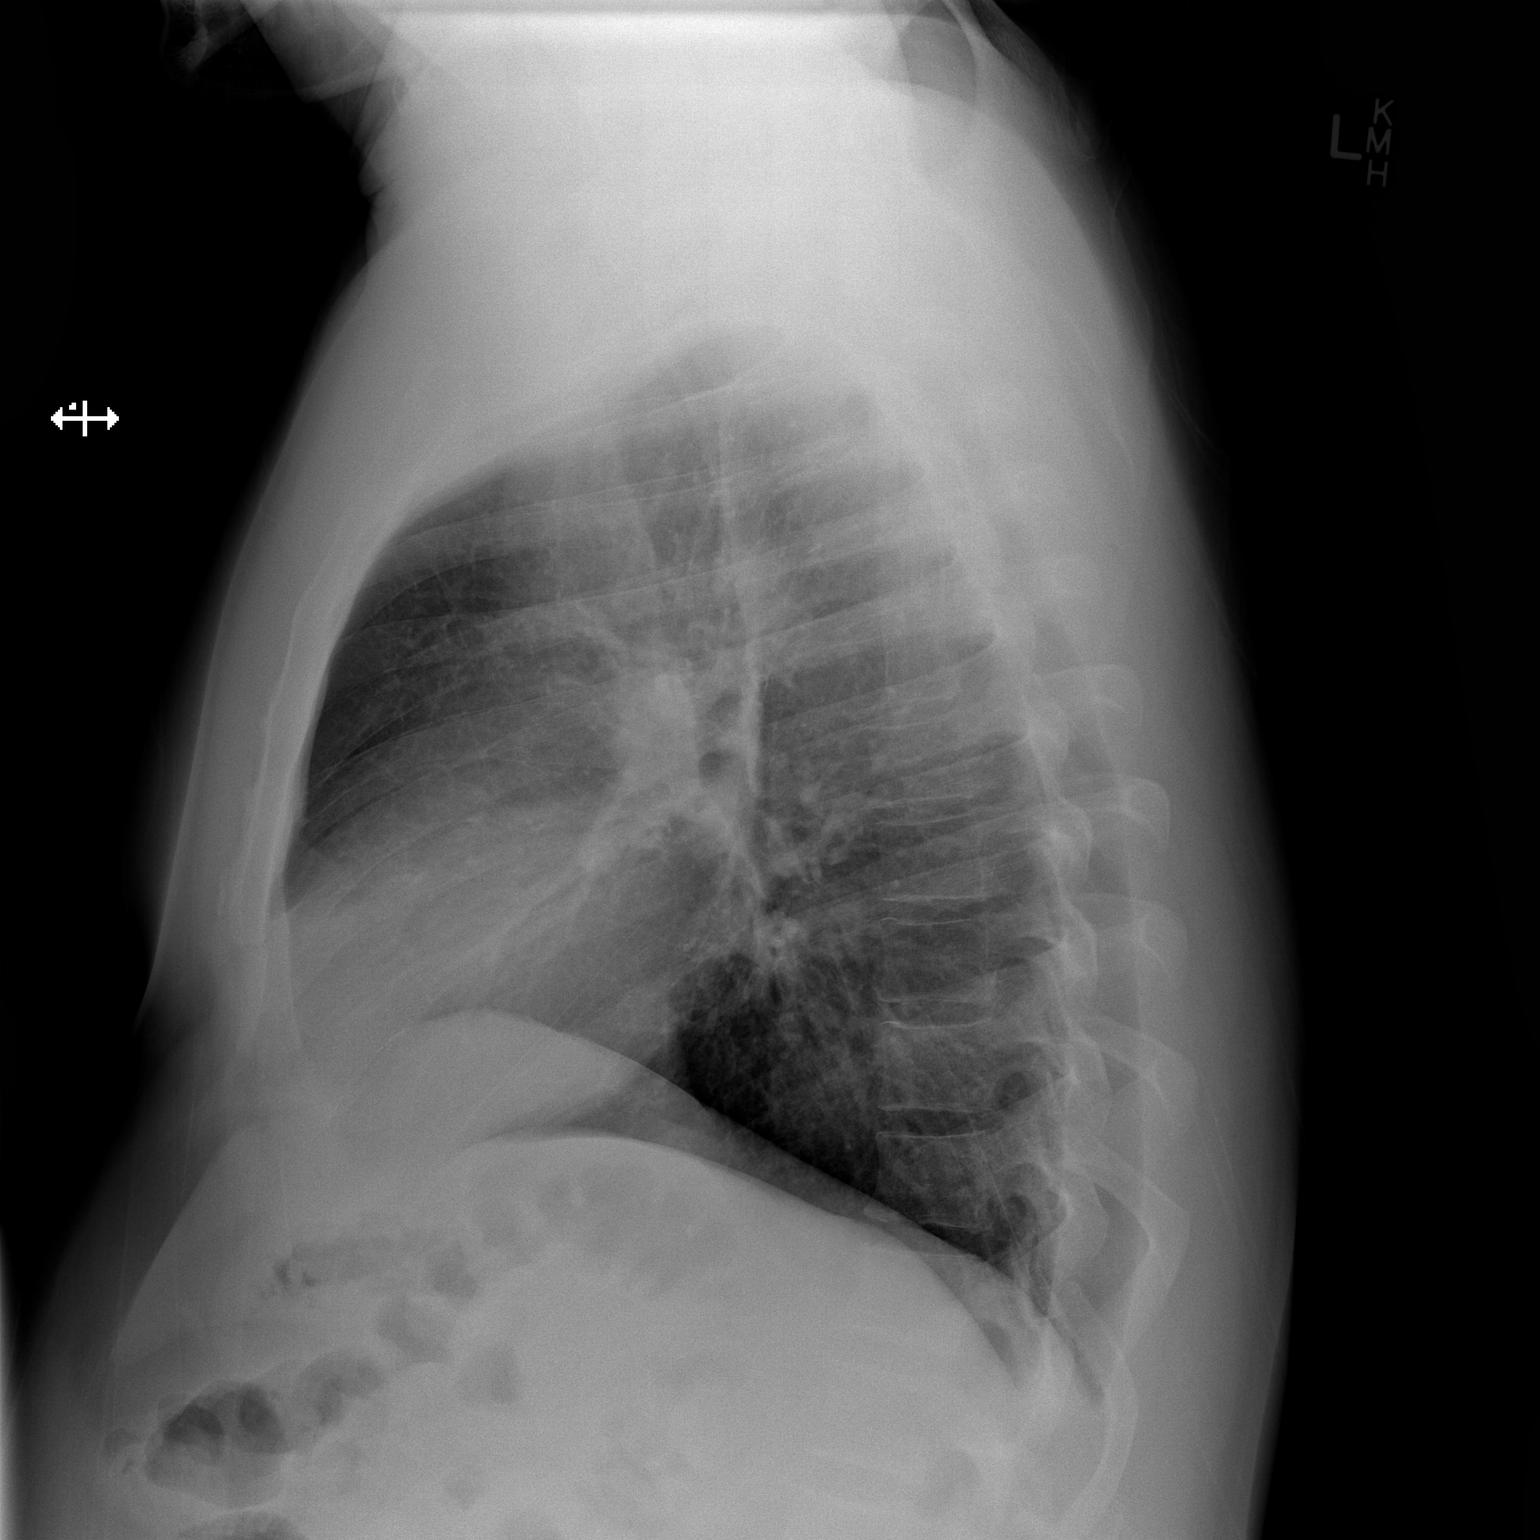

[2 of 2 positions shown; findings below may reference images not displayed]

FINDINGS: Upper-normal size of cardiac silhouette.
Mediastinal contours and pulmonary vascularity normal.
Peribronchial thickening increased since previous exam,
particularly on right.
No definite pulmonary infiltrate, pleural effusion or pneumothorax.
No osseous findings.
IMPRESSION: Increased bronchitic changes since previous study.

## 2012-10-31 ENCOUNTER — Encounter: Payer: Self-pay | Admitting: Internal Medicine

## 2012-10-31 ENCOUNTER — Other Ambulatory Visit: Payer: Self-pay | Admitting: Licensed Clinical Social Worker

## 2012-10-31 DIAGNOSIS — K611 Rectal abscess: Secondary | ICD-10-CM

## 2012-10-31 MED ORDER — SULFAMETHOXAZOLE-TMP DS 800-160 MG PO TABS: 2.0000 | ORAL_TABLET | Freq: Two times a day (BID) | ORAL | Status: DC

## 2012-11-09 IMAGING — CR DG CHEST 2V
2 series · 2 of 2 positions shown · non-contrast
Comparison: 12/04/2011

CLINICAL DATA: Fall

CHEST - 2 VIEW

[w chest pa]
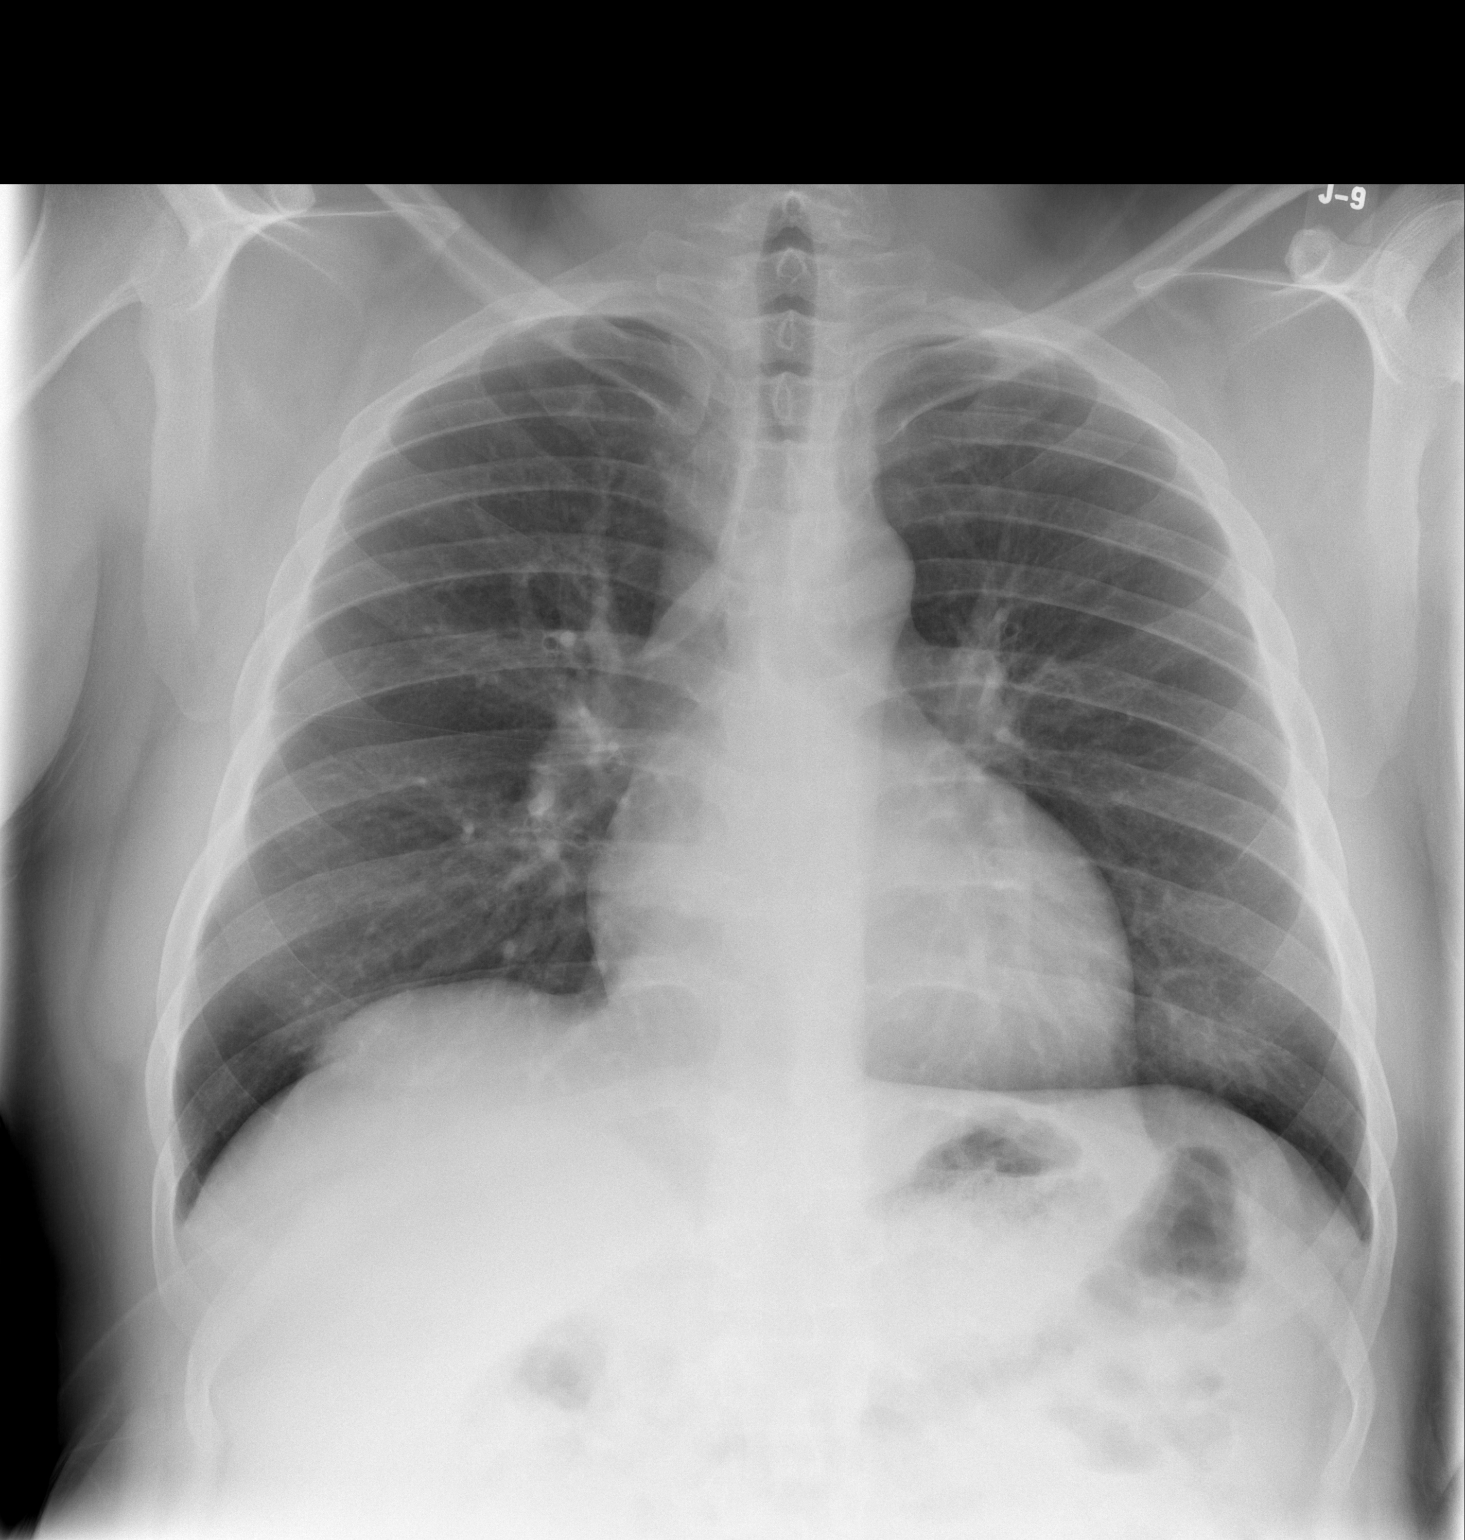

[w chest lat]
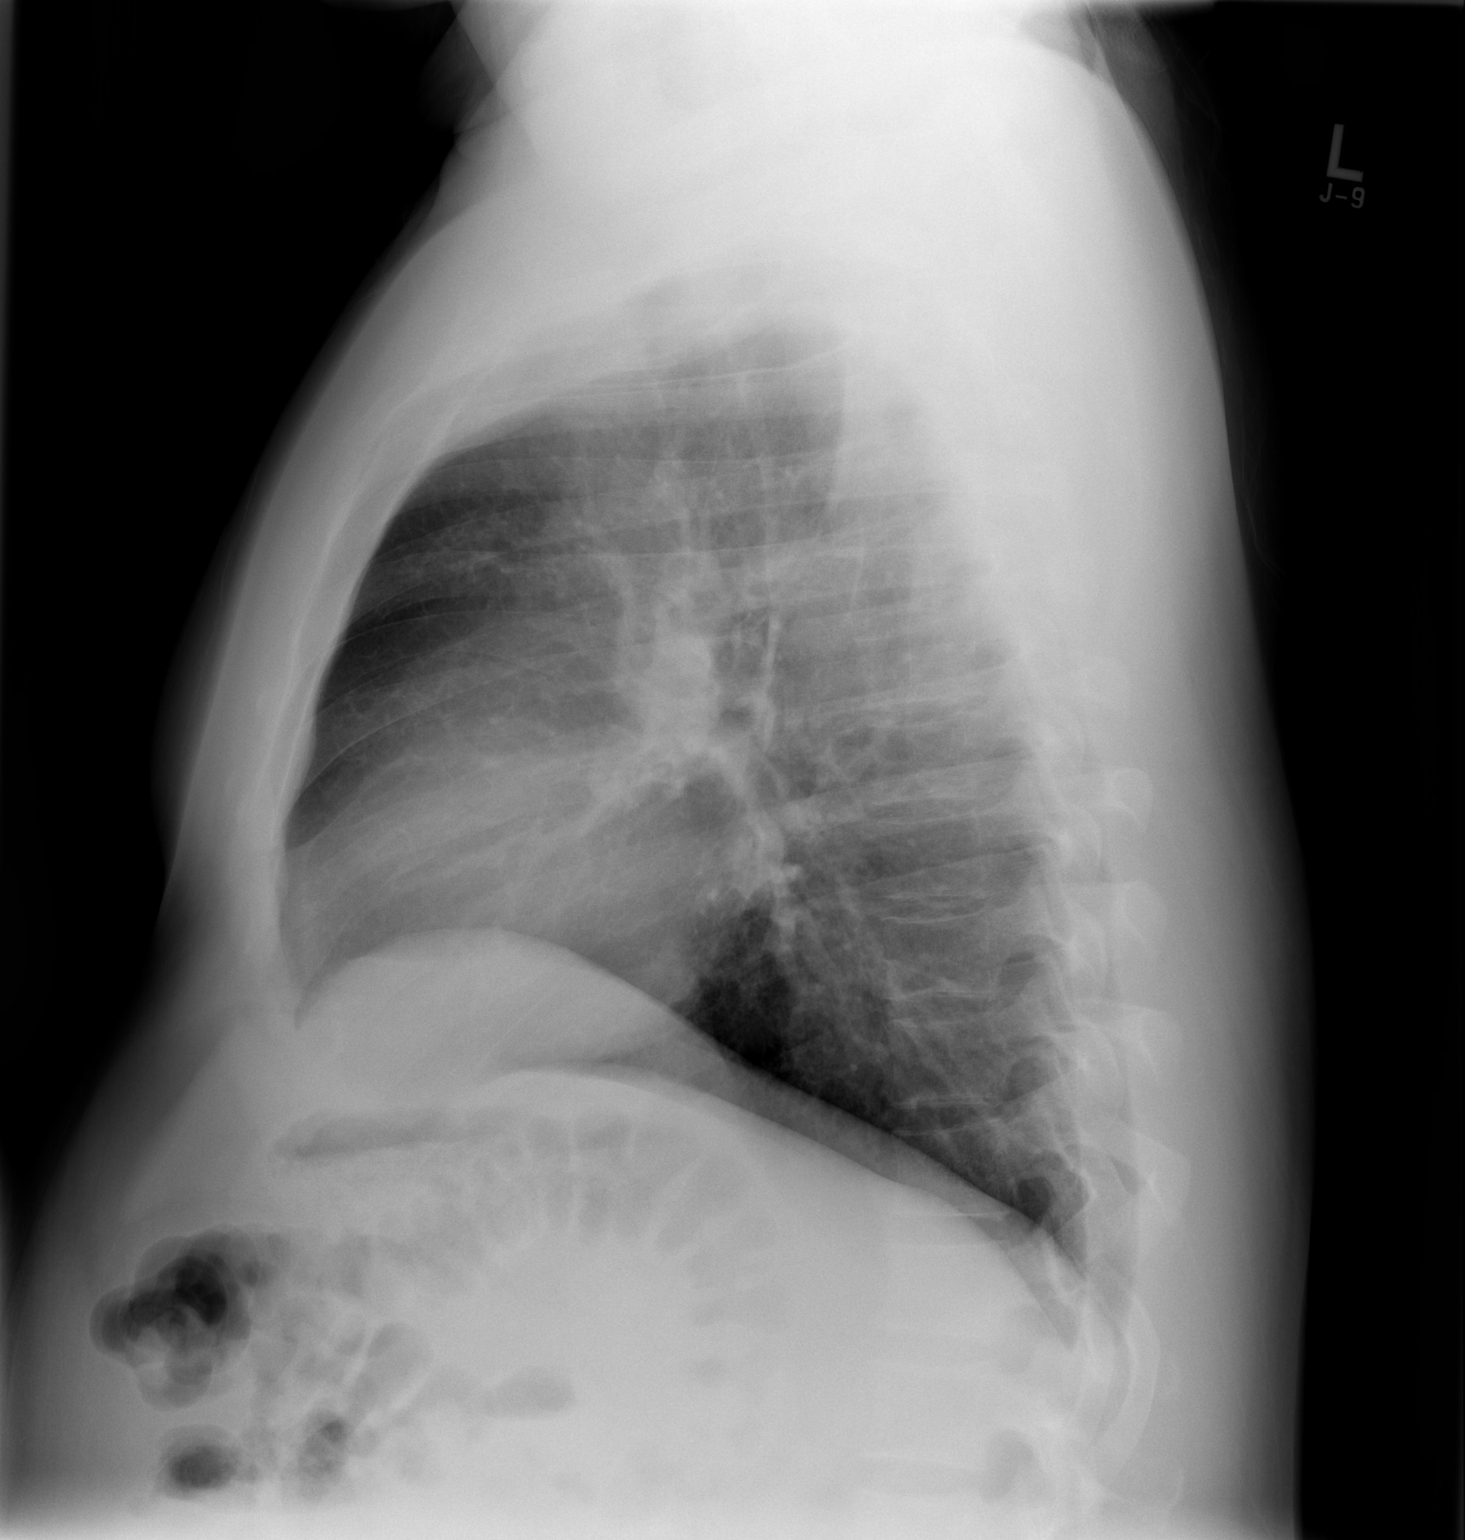

[2 of 2 positions shown; findings below may reference images not displayed]

FINDINGS: Upper normal heart size.  Stable bronchitic changes.  No
peripheral consolidation.  No pneumothorax.  No pleural effusion.
Stable thoracic spine.
IMPRESSION: Stable bronchitic changes.

## 2012-11-13 ENCOUNTER — Other Ambulatory Visit: Payer: Self-pay | Admitting: Internal Medicine

## 2012-11-13 ENCOUNTER — Other Ambulatory Visit (INDEPENDENT_AMBULATORY_CARE_PROVIDER_SITE_OTHER): Payer: No Typology Code available for payment source

## 2012-11-13 DIAGNOSIS — Z79899 Other long term (current) drug therapy: Secondary | ICD-10-CM

## 2012-11-13 DIAGNOSIS — B20 Human immunodeficiency virus [HIV] disease: Secondary | ICD-10-CM

## 2012-11-13 DIAGNOSIS — Z113 Encounter for screening for infections with a predominantly sexual mode of transmission: Secondary | ICD-10-CM

## 2012-11-13 LAB — COMPREHENSIVE METABOLIC PANEL
ALT: 24 U/L (ref 0–53)
Albumin: 3.8 g/dL (ref 3.5–5.2)
CO2: 25 mEq/L (ref 19–32)
Chloride: 109 mEq/L (ref 96–112)
Glucose, Bld: 103 mg/dL — ABNORMAL HIGH (ref 70–99)
Potassium: 4.4 mEq/L (ref 3.5–5.3)
Sodium: 139 mEq/L (ref 135–145)
Total Protein: 6.3 g/dL (ref 6.0–8.3)

## 2012-11-13 LAB — LIPID PANEL
Cholesterol: 173 mg/dL (ref 0–200)
Triglycerides: 97 mg/dL (ref <150)
VLDL: 19 mg/dL (ref 0–40)

## 2012-11-13 LAB — CBC
Hemoglobin: 14.7 g/dL (ref 13.0–17.0)
Platelets: 197 10*3/uL (ref 150–400)
RBC: 4.54 MIL/uL (ref 4.22–5.81)
WBC: 4.4 10*3/uL (ref 4.0–10.5)

## 2012-11-14 LAB — T-HELPER CELL (CD4) - (RCID CLINIC ONLY)
CD4 % Helper T Cell: 25 % — ABNORMAL LOW (ref 33–55)
CD4 T Cell Abs: 460 uL (ref 400–2700)

## 2012-11-14 LAB — HIV-1 RNA QUANT-NO REFLEX-BLD
HIV 1 RNA Quant: <20 copies/mL (ref <20)
HIV-1 RNA Quant, Log: <1.3 {Log} (ref <1.30)

## 2012-11-27 ENCOUNTER — Ambulatory Visit (INDEPENDENT_AMBULATORY_CARE_PROVIDER_SITE_OTHER): Payer: No Typology Code available for payment source | Admitting: Internal Medicine

## 2012-11-27 VITALS — BP 138/96 | HR 80 | Temp 97.6°F | Ht 66.0 in | Wt 221.5 lb

## 2012-11-27 DIAGNOSIS — B20 Human immunodeficiency virus [HIV] disease: Secondary | ICD-10-CM

## 2012-11-27 NOTE — Progress Notes (Signed)
Patient ID: Drew Cuevas, male   DOB: October 01, 1970, 42 y.o.   MRN: [id number]          [facility]  Patient Active Problem List   Diagnosis Date Noted  . HIV DISEASE 08/28/2006    Priority: High  . Essential hypertension, benign 04/28/2008    Priority: Medium  . Obesity (BMI 35.0-39.9 without comorbidity) 05/26/2012  . Diarrhea 11/07/2011  . Right knee pain 04/30/2011  . Recurrent boils 06/10/2009  . INTERNAL HEMORRHOIDS WITHOUT MENTION COMP 12/01/2008  . DEPRESSION, ACUTE 12/20/2006    Patient's Medications  New Prescriptions   No medications on file  Previous Medications   ALBUTEROL (PROVENTIL HFA;VENTOLIN HFA) 108 (90 BASE) MCG/ACT INHALER    Inhale 2 puffs into the lungs every 6 (six) hours as needed. Shortness of breath   ATRIPLA 600-200-300 MG PER TABLET    TAKE 1 TABLET BY MOUTH EVERY NIGHT AT BEDTIME   ONDANSETRON (ZOFRAN ODT) 8 MG DISINTEGRATING TABLET    Take 1 tablet (8 mg total) by mouth every 8 (eight) hours as needed for nausea.  Modified Medications   No medications on file  Discontinued Medications   EFAVIRENZ-EMTRICITABINE-TENOFOVIR (ATRIPLA) 600-200-300 MG PER TABLET    Take 1 tablet by mouth at bedtime.   PROMETHAZINE (PHENERGAN) 25 MG TABLET    Take 1 tablet (25 mg total) by mouth every 6 (six) hours as needed for nausea.   SULFAMETHOXAZOLE-TRIMETHOPRIM (BACTRIM DS) 800-160 MG PER TABLET    Take 2 tablets by mouth 2 (two) times daily.    Subjective: Drew Cuevas is in for his routine visit. He denies missing any Atripla since his last visit. He gets very little regular exercise and when he does it's usually only walking slowly for about 1/2 mile. He says he is not worried about his weight because everyone in his family is happy and he has always been heavy. He was able to successfully quit smoking last October and has had no relapses.  Objective: Temp: 97.6 F (36.4 C) (05/01 1004) Temp src: Oral (05/01 1004) BP: 138/96 mmHg (05/01  1004) Pulse Rate: 80 (05/01 1004)  General: He is in no distress. His body mass index is just over 35 Skin: No rash Lungs: Clear Cor: Regular S1 and S2 no murmurs Abdomen: Obese, soft and nontender   Lab Results HIV 1 RNA Quant (copies/mL)  Date Value  11/13/2012 <20   02/19/2012 <20   11/07/2011 <20      CD4 T Cell Abs (cmm)  Date Value  11/13/2012 460   02/19/2012 680   11/07/2011 720    BMET    Component Value Date/Time   NA 139 11/13/2012 0957   K 4.4 11/13/2012 0957   CL 109 11/13/2012 0957   CO2 25 11/13/2012 0957   GLUCOSE 103* 11/13/2012 0957   BUN 9 11/13/2012 0957   CREATININE 1.32 11/13/2012 0957   CREATININE 1.35 05/28/2012 1020   CALCIUM 9.0 11/13/2012 0957   GFRNONAA 65* 05/28/2012 1020   GFRAA 75* 05/28/2012 1020   Lab Results  Component Value Date   ALT 24 11/13/2012   AST 22 11/13/2012   ALKPHOS 85 11/13/2012   BILITOT 0.3 11/13/2012   Lab Results  Component Value Date   WBC 4.4 11/13/2012   HGB 14.7 11/13/2012   HCT 42.1 11/13/2012   MCV 92.7 11/13/2012   PLT 197 11/13/2012   Lab Results  Component Value Date   CHOL 173 11/13/2012   HDL 31* 11/13/2012  LDLCALC 123* 11/13/2012   TRIG 97 11/13/2012   CHOLHDL 5.6 11/13/2012     Assessment: His HIV infection remains under excellent control. I more concerned about his morbid obesity, borderline hypertension and dyslipidemia. He is not interested in a nutrition referral. I talked to him at length about lifestyle modification.  Plan: 1. Continue Atripla 2. Lifestyle modification counseling provided 3. Follow up after blood work in 6 months   Cliffton Asters, MD [facility] [phone] pager   [phone] cell 11/27/2012, 11:14 AM

## 2012-12-04 ENCOUNTER — Emergency Department (HOSPITAL_COMMUNITY): Payer: No Typology Code available for payment source

## 2012-12-04 ENCOUNTER — Encounter (HOSPITAL_COMMUNITY): Payer: Self-pay | Admitting: Emergency Medicine

## 2012-12-04 ENCOUNTER — Emergency Department (HOSPITAL_COMMUNITY)
Admission: EM | Admit: 2012-12-04 | Discharge: 2012-12-04 | Disposition: A | Discharge status: Home / Self Care | Payer: No Typology Code available for payment source | Attending: Emergency Medicine | Admitting: Emergency Medicine

## 2012-12-04 DIAGNOSIS — D849 Immunodeficiency, unspecified: Secondary | ICD-10-CM | POA: Insufficient documentation

## 2012-12-04 DIAGNOSIS — K5732 Diverticulitis of large intestine without perforation or abscess without bleeding: Secondary | ICD-10-CM | POA: Insufficient documentation

## 2012-12-04 DIAGNOSIS — Z79899 Other long term (current) drug therapy: Secondary | ICD-10-CM | POA: Insufficient documentation

## 2012-12-04 DIAGNOSIS — Z21 Asymptomatic human immunodeficiency virus [HIV] infection status: Secondary | ICD-10-CM | POA: Insufficient documentation

## 2012-12-04 DIAGNOSIS — I10 Essential (primary) hypertension: Secondary | ICD-10-CM | POA: Insufficient documentation

## 2012-12-04 DIAGNOSIS — Z880 Allergy status to penicillin: Secondary | ICD-10-CM | POA: Insufficient documentation

## 2012-12-04 DIAGNOSIS — Z87891 Personal history of nicotine dependence: Secondary | ICD-10-CM | POA: Insufficient documentation

## 2012-12-04 DIAGNOSIS — K5792 Diverticulitis of intestine, part unspecified, without perforation or abscess without bleeding: Secondary | ICD-10-CM

## 2012-12-04 LAB — CBC WITH DIFFERENTIAL/PLATELET
Basophils Absolute: 0 10*3/uL (ref 0.0–0.1)
Basophils Relative: 1 % (ref 0–1)
HCT: 37.8 % — ABNORMAL LOW (ref 39.0–52.0)
Hemoglobin: 13.9 g/dL (ref 13.0–17.0)
Lymphocytes Relative: 30 % (ref 12–46)
MCHC: 36.8 g/dL — ABNORMAL HIGH (ref 30.0–36.0)
Monocytes Absolute: 0.5 10*3/uL (ref 0.1–1.0)
Monocytes Relative: 8 % (ref 3–12)
Neutro Abs: 3.5 10*3/uL (ref 1.7–7.7)
Neutrophils Relative %: 57 % (ref 43–77)
RDW: 13 % (ref 11.5–15.5)
WBC: 6.1 10*3/uL (ref 4.0–10.5)

## 2012-12-04 LAB — URINALYSIS, ROUTINE W REFLEX MICROSCOPIC
Glucose, UA: NEGATIVE mg/dL
Leukocytes, UA: NEGATIVE
Nitrite: NEGATIVE
Specific Gravity, Urine: 1.028 (ref 1.005–1.030)
pH: 6.5 (ref 5.0–8.0)

## 2012-12-04 LAB — BASIC METABOLIC PANEL
CO2: 23 mEq/L (ref 19–32)
Chloride: 106 mEq/L (ref 96–112)
GFR calc Af Amer: 74 mL/min — ABNORMAL LOW (ref >90)
Potassium: 3.7 mEq/L (ref 3.5–5.1)

## 2012-12-04 MED ORDER — OXYCODONE-ACETAMINOPHEN 5-325 MG PO TABS
1.0000 | ORAL_TABLET | Freq: Once | ORAL | Status: AC
  Administered 2012-12-04: 1 via ORAL
  Filled 2012-12-04: qty 1

## 2012-12-04 MED ORDER — CIPROFLOXACIN HCL 500 MG PO TABS
500.0000 mg | ORAL_TABLET | Freq: Two times a day (BID) | ORAL | Status: DC
Start: 2012-12-04 — End: 2013-03-20

## 2012-12-04 MED ORDER — METRONIDAZOLE 500 MG PO TABS: 500.0000 mg | ORAL_TABLET | Freq: Two times a day (BID) | ORAL | Status: DC

## 2012-12-04 MED ORDER — CIPROFLOXACIN HCL 500 MG PO TABS
500.0000 mg | ORAL_TABLET | Freq: Once | ORAL | Status: AC
  Administered 2012-12-04: 500 mg via ORAL
  Filled 2012-12-04: qty 1

## 2012-12-04 MED ORDER — IOHEXOL 300 MG/ML  SOLN
25.0000 mL | INTRAMUSCULAR | Status: AC
  Administered 2012-12-04 (×2): 25 mL via ORAL

## 2012-12-04 MED ORDER — METRONIDAZOLE 500 MG PO TABS
500.0000 mg | ORAL_TABLET | Freq: Once | ORAL | Status: AC
  Administered 2012-12-04: 500 mg via ORAL
  Filled 2012-12-04: qty 1

## 2012-12-04 MED ORDER — IOHEXOL 300 MG/ML  SOLN
100.0000 mL | Freq: Once | INTRAMUSCULAR | Status: AC | PRN
  Administered 2012-12-04: 100 mL via INTRAVENOUS

## 2012-12-04 NOTE — ED Notes (Signed)
Urine sent down to lab at 1319. Lab unable to see orders. Reordered urinalysis and lab called to ensure that it is completed.

## 2012-12-04 NOTE — ED Provider Notes (Signed)
History     CSN: [id number]  Arrival date & time 12/04/12  1210   First MD Initiated Contact with Patient 12/04/12 1228      Chief Complaint  Patient presents with  . Abdominal Pain    (Consider location/radiation/quality/duration/timing/severity/associated sxs/prior treatment) HPI Comments: Pt with PMH significant for HIV, HTN presents to the ED for intermittent LLQ abdominal pain x 3 weeks.  States the pain will come and go but came back last night is now worse than ever.  Pt states sometimes when he attempts to move he "feels a pulling sensation" in his lower abdomen.  Pain does not radiate into his groin or testicles.  Pt denies any incidents of heavy lifting or strenuous physical activity.  No prior abdominal surgeries.  BM normal.  Denies any dysuria, hematuria, or increased frequency of urination.  Denies any chest pain, SOB, nausea, vomiting, or diarrhea.  Patient is a 42 y.o. male presenting with abdominal pain. The history is provided by the patient.  Abdominal Pain   Past Medical History  Diagnosis Date  . Hypertension   . HIV (human immunodeficiency virus infection)   . Immune deficiency disorder     History reviewed. No pertinent past surgical history.  History reviewed. No pertinent family history.  History  Substance Use Topics  . Smoking status: Former Smoker    Types: Cigarettes    Quit date: 05/05/2012  . Smokeless tobacco: Never Used  . Alcohol Use: No      Review of Systems  Gastrointestinal: Positive for abdominal pain.  All other systems reviewed and are negative.    Allergies  Ibuprofen and Penicillins  Home Medications   Current Outpatient Rx  Name  Route  Sig  Dispense  Refill  . albuterol (PROVENTIL HFA;VENTOLIN HFA) 108 (90 BASE) MCG/ACT inhaler   Inhalation   Inhale 2 puffs into the lungs every 6 (six) hours as needed. Shortness of breath         . ATRIPLA 600-200-300 MG per tablet      TAKE 1 TABLET BY MOUTH EVERY NIGHT AT  BEDTIME   30 tablet   11   . ondansetron (ZOFRAN ODT) 8 MG disintegrating tablet   Oral   Take 1 tablet (8 mg total) by mouth every 8 (eight) hours as needed for nausea.   20 tablet   0     BP 126/82  Pulse 104  Temp(Src) 98.1 F (36.7 C) (Oral)  Resp 22  SpO2 96%  Physical Exam  Nursing note and vitals reviewed. Constitutional: He is oriented to person, place, and time. He appears well-developed and well-nourished.  HENT:  Head: Normocephalic and atraumatic.  Mouth/Throat: Oropharynx is clear and moist.  Eyes: Conjunctivae and EOM are normal. Pupils are equal, round, and reactive to light.  Neck: Normal range of motion.  Cardiovascular: Normal rate, regular rhythm and normal heart sounds.   Pulmonary/Chest: Effort normal and breath sounds normal.  Abdominal: Soft. Bowel sounds are normal. There is tenderness in the left lower quadrant. There is no CVA tenderness, no tenderness at McBurney's point and negative Murphy's sign. No hernia.    Genitourinary:  No ventral hernia, inguinal hernia, mass, or lymphadenopathy appreciated; testicles descended, non-tender, and symmetrical in appearance  Musculoskeletal: Normal range of motion.  Lymphadenopathy:       Right: No inguinal adenopathy present.       Left: No inguinal adenopathy present.  Neurological: He is alert and oriented to person, place, and time.  Skin:  Skin is warm and dry.  Psychiatric: He has a normal mood and affect.    ED Course  Procedures (including critical care time)  Labs Reviewed  CBC WITH DIFFERENTIAL - Abnormal; Notable for the following:    RBC 4.20 (*)    HCT 37.8 (*)    MCHC 36.8 (*)    All other components within normal limits  BASIC METABOLIC PANEL - Abnormal; Notable for the following:    Glucose, Bld 110 (*)    Creatinine, Ser 1.36 (*)    Calcium 8.3 (*)    GFR calc non Af Amer 64 (*)    GFR calc Af Amer 74 (*)    All other components within normal limits  URINALYSIS, ROUTINE W  REFLEX MICROSCOPIC  URINALYSIS, MICROSCOPIC ONLY   Ct Abdomen Pelvis W Contrast  12/04/2012  *RADIOLOGY REPORT*  Clinical Data: Left lower quadrant pain.  CT ABDOMEN AND PELVIS WITH CONTRAST  Technique:  Multidetector CT imaging of the abdomen and pelvis was performed following the standard protocol during bolus administration of intravenous contrast.  Contrast: OMNIPAQUE IOHEXOL 300 MG/ML  SOLN  Comparison: 06/20/2008  Findings: The bases are clear.  There is no evidence for free intraperitoneal air.  No gross abnormality to the liver, gallbladder and portal venous system.  Normal appearance of the spleen, pancreas and adrenal glands.  Normal appearance of the left kidney with a small cortical cyst.  There is a exophytic 1.8 cm structure in the right kidney which has enlarged from 1.2 cm. Hounsfield units measure 18 and most likely represents a cyst.  No significant free fluid or lymphadenopathy.  Normal appendix in the right lower quadrant.  There is a small focus of pericolonic inflammation in the left lower anterior abdomen.  This is near the junction of the sigmoid colon and descending colon.  The patient does have colonic diverticula in this area.  Findings are concerning for a small focus of diverticulitis versus epiploic appendagitis.  There is no evidence for an abscess collection.  No gross abnormality to the prostate, urinary bladder or seminal vesicles.  No acute bony abnormality.  IMPRESSION: Pericolonic inflammation in the anterior left lower abdomen. Differential diagnosis includes an acute diverticulitis versus epiploic appendagitis.  No evidence for an abscess collection.  Probable renal cysts as described.   Original Report Authenticated By: Richarda Overlie, M.D.      1. Diverticulitis       MDM   Pt presenting to the ED for intermittent LLQ pain x 3 weeks.  Pain does not radiate into groin or testicles.  No episodes of heavy lifting.  Pt afebrile, non-toxic appearing, VS  stable.  No hernia appreciated on physical exam.  Mild renal impairment noted, similar to previous from April 2014- this appears to be his baseline. No leukocytosis.  CT abd/pelvis pending at this time- possible diverticulitis.  Signed out to Dr. Anitra Lauth for continuation of care and disposition when appropriate.     Garlon Hatchet, PA-C 12/04/12 2139

## 2012-12-04 NOTE — ED Notes (Signed)
Pt c/o LLQ pain intermittently x 3 weeks; pt denies pain in groin or testicle

## 2012-12-05 NOTE — ED Provider Notes (Signed)
Medical screening examination/treatment/procedure(s) were conducted as a shared visit with non-physician practitioner(s) and myself.  I personally evaluated the patient during the encounter   Loren Racer, MD 12/05/12 1620

## 2013-01-08 ENCOUNTER — Emergency Department (HOSPITAL_COMMUNITY)
Admission: EM | Admit: 2013-01-08 | Discharge: 2013-01-09 | Disposition: A | Discharge status: Home / Self Care | Payer: No Typology Code available for payment source | Attending: Emergency Medicine | Admitting: Emergency Medicine

## 2013-01-08 ENCOUNTER — Encounter (HOSPITAL_COMMUNITY): Payer: Self-pay

## 2013-01-08 DIAGNOSIS — I10 Essential (primary) hypertension: Secondary | ICD-10-CM | POA: Insufficient documentation

## 2013-01-08 DIAGNOSIS — Z862 Personal history of diseases of the blood and blood-forming organs and certain disorders involving the immune mechanism: Secondary | ICD-10-CM | POA: Insufficient documentation

## 2013-01-08 DIAGNOSIS — Z8639 Personal history of other endocrine, nutritional and metabolic disease: Secondary | ICD-10-CM | POA: Insufficient documentation

## 2013-01-08 DIAGNOSIS — Z880 Allergy status to penicillin: Secondary | ICD-10-CM | POA: Insufficient documentation

## 2013-01-08 DIAGNOSIS — L02219 Cutaneous abscess of trunk, unspecified: Secondary | ICD-10-CM | POA: Insufficient documentation

## 2013-01-08 DIAGNOSIS — Z79899 Other long term (current) drug therapy: Secondary | ICD-10-CM | POA: Insufficient documentation

## 2013-01-08 DIAGNOSIS — Z87891 Personal history of nicotine dependence: Secondary | ICD-10-CM | POA: Insufficient documentation

## 2013-01-08 DIAGNOSIS — Z21 Asymptomatic human immunodeficiency virus [HIV] infection status: Secondary | ICD-10-CM | POA: Insufficient documentation

## 2013-01-08 DIAGNOSIS — L03319 Cellulitis of trunk, unspecified: Secondary | ICD-10-CM | POA: Insufficient documentation

## 2013-01-08 DIAGNOSIS — L0291 Cutaneous abscess, unspecified: Secondary | ICD-10-CM

## 2013-01-08 NOTE — ED Notes (Signed)
Pt complains of an abcess in his groin area, not draining yet but sore

## 2013-01-08 NOTE — ED Provider Notes (Signed)
History  This chart was scribed for non-physician practitioner Magnus Sinning, PA-C working with Sunnie Nielsen, MD, by Candelaria Stagers, ED Scribe. This patient was seen in room WTR6/WTR6 and the patient's care was started at 11:47 PM   CSN: [id number]  Arrival date & time 01/08/13  2207   First MD Initiated Contact with Patient 01/08/13 2302      Chief Complaint  Patient presents with  . Recurrent Skin Infections     The history is provided by the patient. No language interpreter was used.   HPI Comments: Drew Cuevas is a 42 y.o. male who presents to the Emergency Department complaining of an abscess to the right groin that started three days ago and has gradually worsened.  He has not had any drainage from the area.  Pt has h/o abscesses.  He has applied warm compresses with no relief.  He denies h/o diabetes.  He denies fever or chills.     Past Medical History  Diagnosis Date  . Hypertension   . HIV (human immunodeficiency virus infection)   . Immune deficiency disorder     History reviewed. No pertinent past surgical history.  History reviewed. No pertinent family history.  History  Substance Use Topics  . Smoking status: Former Smoker    Types: Cigarettes    Quit date: 05/05/2012  . Smokeless tobacco: Never Used  . Alcohol Use: No      Review of Systems  Constitutional: Negative for fever and chills.  Skin:       Right inguinal abscess   All other systems reviewed and are negative.    Allergies  Ibuprofen and Penicillins  Home Medications   Current Outpatient Rx  Name  Route  Sig  Dispense  Refill  . albuterol (PROVENTIL HFA;VENTOLIN HFA) 108 (90 BASE) MCG/ACT inhaler   Inhalation   Inhale 2 puffs into the lungs every 6 (six) hours as needed for wheezing. Shortness of breath         . ciprofloxacin (CIPRO) 500 MG tablet   Oral   Take 1 tablet (500 mg total) by mouth every 12 (twelve) hours.   14 tablet   0   .  efavirenz-emtricitabine-tenofovir (ATRIPLA) 600-200-300 MG per tablet   Oral   Take 1 tablet by mouth at bedtime.         . metroNIDAZOLE (FLAGYL) 500 MG tablet   Oral   Take 1 tablet (500 mg total) by mouth 2 (two) times daily.   14 tablet   0     BP 141/84  Pulse 90  Temp(Src) 99 F (37.2 C) (Oral)  Resp 20  SpO2 100%  Physical Exam  Nursing note and vitals reviewed. Constitutional: He is oriented to person, place, and time. He appears well-developed and well-nourished. No distress.  HENT:  Head: Normocephalic and atraumatic.  Eyes: EOM are normal.  Neck: Neck supple. No tracheal deviation present.  Cardiovascular: Normal rate.   Pulmonary/Chest: Effort normal. No respiratory distress.  Musculoskeletal: Normal range of motion.  Neurological: He is alert and oriented to person, place, and time.  Skin: Skin is warm and dry.  1.5 cm right inguinal fluctuant abscess tender to palpation no surrounding erythema or edema.   Psychiatric: He has a normal mood and affect. His behavior is normal.    ED Course  Procedures   DIAGNOSTIC STUDIES: Oxygen Saturation is 100% on room air, normal by my interpretation.    COORDINATION OF CARE:  11:49 PM Discussed course of  care with pt which includes I&D of abscess. Pt understands and agrees.   INCISION AND DRAINAGE PROCEDURE NOTE: Patient identification was confirmed and verbal consent was obtained. This procedure was performed by Magnus Sinning, PA-C at 11:50 PM. Site: right groin Sterile procedures observed Needle size: 25 Gauge Anesthetic used (type and amt): 2% lidocaine with epi, Blade size: 1 Drainage: moderate Site anesthetized, incision made over site, wound drained and explored loculations, rinsed with copious amounts of normal saline, covered with dry, sterile dressing.  Pt tolerated procedure well without complications.  Instructions for care discussed verbally and pt provided with additional  written instructions for homecare and f/u.  Labs Reviewed - No data to display No results found.   No diagnosis found.    MDM  Patient with skin abscess amenable to incision and drainage.  Abscess was not large enough to warrant packing or drain,  wound recheck in 2 days. Encouraged home warm soaks and flushing.  No signs of cellulitis is surrounding skin.  Will d/c to home.  No antibiotic therapy is indicated.  I personally performed the services described in this documentation, which was scribed in my presence. The recorded information has been reviewed and is accurate.        Pascal Lux Diggins, PA-C 01/09/13 [phone]

## 2013-01-10 NOTE — ED Provider Notes (Signed)
Medical screening examination/treatment/procedure(s) were performed by non-physician practitioner and as supervising physician I was immediately available for consultation/collaboration.  Sunnie Nielsen, MD 01/10/13 [phone]

## 2013-01-12 ENCOUNTER — Emergency Department (HOSPITAL_COMMUNITY)
Admission: EM | Admit: 2013-01-12 | Discharge: 2013-01-12 | Discharge status: Against Medical Advice | Payer: No Typology Code available for payment source | Attending: Emergency Medicine | Admitting: Emergency Medicine

## 2013-01-12 ENCOUNTER — Emergency Department (HOSPITAL_COMMUNITY)
Admission: EM | Admit: 2013-01-12 | Discharge: 2013-01-12 | Disposition: A | Discharge status: Home / Self Care | Payer: No Typology Code available for payment source | Source: Home / Self Care | Attending: Emergency Medicine | Admitting: Emergency Medicine

## 2013-01-12 ENCOUNTER — Encounter (HOSPITAL_COMMUNITY): Payer: Self-pay | Admitting: Emergency Medicine

## 2013-01-12 DIAGNOSIS — R209 Unspecified disturbances of skin sensation: Secondary | ICD-10-CM | POA: Insufficient documentation

## 2013-01-12 DIAGNOSIS — X500XXA Overexertion from strenuous movement or load, initial encounter: Secondary | ICD-10-CM | POA: Insufficient documentation

## 2013-01-12 DIAGNOSIS — I10 Essential (primary) hypertension: Secondary | ICD-10-CM | POA: Insufficient documentation

## 2013-01-12 DIAGNOSIS — Y9289 Other specified places as the place of occurrence of the external cause: Secondary | ICD-10-CM | POA: Insufficient documentation

## 2013-01-12 DIAGNOSIS — Y9389 Activity, other specified: Secondary | ICD-10-CM | POA: Insufficient documentation

## 2013-01-12 DIAGNOSIS — Y990 Civilian activity done for income or pay: Secondary | ICD-10-CM | POA: Insufficient documentation

## 2013-01-12 DIAGNOSIS — IMO0002 Reserved for concepts with insufficient information to code with codable children: Secondary | ICD-10-CM | POA: Insufficient documentation

## 2013-01-12 DIAGNOSIS — Z21 Asymptomatic human immunodeficiency virus [HIV] infection status: Secondary | ICD-10-CM | POA: Insufficient documentation

## 2013-01-12 NOTE — ED Notes (Signed)
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 Pt c/o pain in lower back and right hip. Pt reports intermittent right arm numbness since yesterday. Pt MAE, alert, oriented x4, NAD

## 2013-01-12 NOTE — ED Notes (Signed)
No answer in lobby x 3 

## 2013-01-12 NOTE — ED Notes (Signed)
Pt told registration desk staff. He didn't want to wait and walked out of the dept.

## 2013-02-05 NOTE — Addendum Note (Signed)
Addended by: Jennet Maduro D on: 02/05/2013 04:12 PM   Modules accepted: Orders

## 2013-02-27 ENCOUNTER — Ambulatory Visit: Payer: No Typology Code available for payment source

## 2013-03-04 ENCOUNTER — Emergency Department (INDEPENDENT_AMBULATORY_CARE_PROVIDER_SITE_OTHER)
Admission: EM | Admit: 2013-03-04 | Discharge: 2013-03-04 | Disposition: A | Discharge status: Home / Self Care | Payer: Self-pay | Source: Home / Self Care

## 2013-03-04 ENCOUNTER — Encounter (HOSPITAL_COMMUNITY): Payer: Self-pay | Admitting: *Deleted

## 2013-03-04 DIAGNOSIS — L290 Pruritus ani: Secondary | ICD-10-CM

## 2013-03-04 MED ORDER — TERBINAFINE HCL 1 % EX CREA: TOPICAL_CREAM | Freq: Two times a day (BID) | CUTANEOUS | Status: DC

## 2013-03-04 NOTE — ED Notes (Signed)
Pt   Reports  Symptoms  Of       Rectal  Itching       X  sev  Weeks     -    Has  Had  The  Symptoms  For  Quite  A  While   Worse  Recently     Mild  Pain  /  Bleeding        Denys  Any      Constipation

## 2013-03-09 ENCOUNTER — Ambulatory Visit: Payer: Self-pay

## 2013-03-10 NOTE — ED Provider Notes (Signed)
CSN: [id number]     Arrival date & time 03/04/13  9562 History     First MD Initiated Contact with Patient 03/04/13 1105     Chief Complaint  Patient presents with  . Pruritis   (Consider location/radiation/quality/duration/timing/severity/associated sxs/prior Treatment) HPI  42 yo bm comes in today with complaints of anal itching.  Hx pos for HIV.  States that current problem ongoing for several weeks but has been treated multiple times in the last couple of years for the same issue.  Has used over the counter creams and powders without relief.  States that itching is external.  Denies rectal discharge, bleeding, fever, nausea, vomiting, abd pain, constipation, diarrhea.  Denies having male or male sexual partners. Has not discussed this problem with his infectious disease physician.    Past Medical History  Diagnosis Date  . Hypertension   . HIV (human immunodeficiency virus infection)   . Immune deficiency disorder    History reviewed. No pertinent past surgical history. No family history on file. History  Substance Use Topics  . Smoking status: Former Smoker    Types: Cigarettes    Quit date: 05/05/2012  . Smokeless tobacco: Never Used  . Alcohol Use: No    Review of Systems  Constitutional: Negative.   HENT: Negative.   Eyes: Negative.   Respiratory: Negative.   Cardiovascular: Negative.   Gastrointestinal: Negative for nausea, vomiting, abdominal pain, diarrhea, constipation, blood in stool, abdominal distention, anal bleeding and rectal pain.  Genitourinary: Negative.   Musculoskeletal: Negative.   Skin: Negative.   Neurological: Negative.   Psychiatric/Behavioral: Negative.     Allergies  Ibuprofen and Penicillins  Home Medications   Current Outpatient Rx  Name  Route  Sig  Dispense  Refill  . albuterol (PROVENTIL HFA;VENTOLIN HFA) 108 (90 BASE) MCG/ACT inhaler   Inhalation   Inhale 2 puffs into the lungs every 6 (six) hours as needed for wheezing.  Shortness of breath         . ciprofloxacin (CIPRO) 500 MG tablet   Oral   Take 1 tablet (500 mg total) by mouth every 12 (twelve) hours.   14 tablet   0   . efavirenz-emtricitabine-tenofovir (ATRIPLA) 600-200-300 MG per tablet   Oral   Take 1 tablet by mouth at bedtime.         . metroNIDAZOLE (FLAGYL) 500 MG tablet   Oral   Take 1 tablet (500 mg total) by mouth 2 (two) times daily.   14 tablet   0   . terbinafine (LAMISIL AT) 1 % cream   Topical   Apply topically 2 (two) times daily. To affected area.   30 g   1    BP 140/96  Pulse 94  Temp(Src) 98.3 F (36.8 C) (Oral)  Resp 18  SpO2 100% Physical Exam  Constitutional: He is oriented to person, place, and time. He appears well-nourished.  HENT:  Head: Normocephalic and atraumatic.  Eyes: EOM are normal. Pupils are equal, round, and reactive to light.  Neck: Normal range of motion.  Genitourinary:  Externally he has multiple papular lesions.  Examined with dr Denyse Amass and thinks this may be a possible fungal infection.  No drainage, redness.  Area nontender.   Neurological: He is alert and oriented to person, place, and time.  Skin: Skin is warm.  Psychiatric: He has a normal mood and affect.    ED Course   Procedures (including critical care time)  Labs Reviewed - No data to  display No results found. 1. Anal itching     MDM  Patient given medication listed below.  If not getting better within the next couple of weeks he will f/u with his infectious disease physician.  All questions answered.    Meds ordered this encounter  Medications  . terbinafine (LAMISIL AT) 1 % cream    Sig: Apply topically 2 (two) times daily. To affected area.    Dispense:  30 g    Refill:  1    Saulo Anthis, PA-C 03/10/13 1622

## 2013-03-12 NOTE — ED Provider Notes (Signed)
Medical screening examination/treatment/procedure(s) were performed by a resident physician or non-physician practitioner and as the supervising physician I was immediately available for consultation/collaboration.  Nikolai Wilczak, MD   Devlin Brink S Sidi Dzikowski, MD 03/12/13 1450 

## 2013-03-19 ENCOUNTER — Encounter: Payer: Self-pay | Admitting: Internal Medicine

## 2013-03-20 ENCOUNTER — Encounter (HOSPITAL_COMMUNITY): Payer: Self-pay | Admitting: Emergency Medicine

## 2013-03-20 ENCOUNTER — Other Ambulatory Visit: Payer: Self-pay | Admitting: Licensed Clinical Social Worker

## 2013-03-20 ENCOUNTER — Emergency Department (HOSPITAL_COMMUNITY)
Admission: EM | Admit: 2013-03-20 | Discharge: 2013-03-21 | Disposition: A | Discharge status: Home / Self Care | Payer: No Typology Code available for payment source | Attending: Emergency Medicine | Admitting: Emergency Medicine

## 2013-03-20 DIAGNOSIS — D849 Immunodeficiency, unspecified: Secondary | ICD-10-CM | POA: Insufficient documentation

## 2013-03-20 DIAGNOSIS — Z21 Asymptomatic human immunodeficiency virus [HIV] infection status: Secondary | ICD-10-CM | POA: Insufficient documentation

## 2013-03-20 DIAGNOSIS — Z87891 Personal history of nicotine dependence: Secondary | ICD-10-CM | POA: Insufficient documentation

## 2013-03-20 DIAGNOSIS — R0982 Postnasal drip: Secondary | ICD-10-CM

## 2013-03-20 DIAGNOSIS — I10 Essential (primary) hypertension: Secondary | ICD-10-CM | POA: Insufficient documentation

## 2013-03-20 DIAGNOSIS — Z79899 Other long term (current) drug therapy: Secondary | ICD-10-CM | POA: Insufficient documentation

## 2013-03-20 DIAGNOSIS — R42 Dizziness and giddiness: Secondary | ICD-10-CM | POA: Insufficient documentation

## 2013-03-20 DIAGNOSIS — Z880 Allergy status to penicillin: Secondary | ICD-10-CM | POA: Insufficient documentation

## 2013-03-20 MED ORDER — ONDANSETRON 8 MG PO TBDP: 8.0000 mg | ORAL_TABLET | Freq: Three times a day (TID) | ORAL | Status: DC | PRN

## 2013-03-20 NOTE — ED Provider Notes (Signed)
CSN: [id number]     Arrival date & time 03/20/13  2242 History     First MD Initiated Contact with Patient 03/20/13 2344     Chief Complaint  Patient presents with  . Dizziness  . Cough  . Nasal Congestion   (Consider location/radiation/quality/duration/timing/severity/associated sxs/prior Treatment) Patient is a 42 y.o. male presenting with URI. The history is provided by the patient.  URI Presenting symptoms: congestion and rhinorrhea   Presenting symptoms: no facial pain, no fatigue, no fever and no sore throat   Presenting symptoms comment:  Post nasal drip ears muffled Congestion:    Location:  Nasal   Interferes with sleep: no     Interferes with eating/drinking: no   Severity:  Moderate Onset quality:  Sudden Duration:  1 day Timing:  Constant Progression:  Unchanged Chronicity:  New Relieved by:  Nothing Worsened by:  Nothing tried Ineffective treatments:  None tried Associated symptoms: no arthralgias, no headaches, no myalgias, no neck pain, no sinus pain, no swollen glands and no wheezing   Room mate lit a bunch of scented candles and nose closed up began having ear congestion ears popping and congestion and felt slightly light headed.  No DOE, no SOB no CP.  No n/v/d.  No f/c/r no neck pain.  No rashes no travel.  Past Medical History  Diagnosis Date  . Hypertension   . HIV (human immunodeficiency virus infection)   . Immune deficiency disorder    History reviewed. No pertinent past surgical history. No family history on file. History  Substance Use Topics  . Smoking status: Former Smoker    Types: Cigarettes    Quit date: 05/05/2012  . Smokeless tobacco: Never Used  . Alcohol Use: No    Review of Systems  Constitutional: Negative for fever, chills, appetite change and fatigue.  HENT: Positive for congestion, rhinorrhea and postnasal drip. Negative for sore throat, drooling, mouth sores, trouble swallowing, neck pain, neck stiffness, voice change,  sinus pressure and tinnitus.   Respiratory: Negative for wheezing.   Musculoskeletal: Negative for myalgias and arthralgias.  Neurological: Negative for headaches.  All other systems reviewed and are negative.    Allergies  Ibuprofen and Penicillins  Home Medications   Current Outpatient Rx  Name  Route  Sig  Dispense  Refill  . albuterol (PROVENTIL HFA;VENTOLIN HFA) 108 (90 BASE) MCG/ACT inhaler   Inhalation   Inhale 2 puffs into the lungs every 6 (six) hours as needed for wheezing. Shortness of breath         . efavirenz-emtricitabine-tenofovir (ATRIPLA) 600-200-300 MG per tablet   Oral   Take 1 tablet by mouth at bedtime.         . ondansetron (ZOFRAN ODT) 8 MG disintegrating tablet   Oral   Take 1 tablet (8 mg total) by mouth every 8 (eight) hours as needed for nausea.   20 tablet   0    BP 132/96  Pulse 115  Temp(Src) 98.2 F (36.8 C) (Oral)  Resp 16  SpO2 96% Physical Exam  Constitutional: He is oriented to person, place, and time. He appears well-developed and well-nourished. No distress.  HENT:  Head: Normocephalic and atraumatic.  Right Ear: No mastoid tenderness. Tympanic membrane is not injected and not scarred.  Left Ear: No mastoid tenderness. Tympanic membrane is not injected and not scarred.  Nose: Right sinus exhibits no maxillary sinus tenderness. Left sinus exhibits no maxillary sinus tenderness.  Mouth/Throat: Oropharynx is clear and moist.  Posterior  oropharyngeal cobblestoning with clear colorless drainage   Eyes: Conjunctivae are normal. Pupils are equal, round, and reactive to light.  Neck: Normal range of motion. Neck supple.  Cardiovascular: Normal rate, regular rhythm and intact distal pulses.   Pulmonary/Chest: Effort normal and breath sounds normal. He has no wheezes. He has no rales. He exhibits no tenderness.  Abdominal: Soft. Bowel sounds are normal. There is no tenderness. There is no rebound and no guarding.  Lymphadenopathy:     He has no cervical adenopathy.  Neurological: He is alert and oriented to person, place, and time.  Skin: Skin is warm and dry.  Psychiatric: He has a normal mood and affect.    ED Course   Procedures (including critical care time)  Labs Reviewed - No data to display No results found. No diagnosis found.  MDM  Will treat for post nasal drip  Drew Cuevas K Nyheim Seufert-Rasch, MD 03/21/13 4098

## 2013-03-20 NOTE — ED Notes (Signed)
Pt states he thinks he has a sinus infection.  C/o congestion, drainage down back of throat, and non-productive cough since yesterday.  Reports dizziness and high blood pressure today.  Denies pain. No neuro deficits noted.

## 2013-03-21 ENCOUNTER — Emergency Department (HOSPITAL_COMMUNITY): Payer: No Typology Code available for payment source

## 2013-03-21 MED ORDER — LORATADINE 10 MG PO TABS: 10.0000 mg | ORAL_TABLET | Freq: Every day | ORAL | Status: DC

## 2013-03-21 MED ORDER — FLUTICASONE PROPIONATE 50 MCG/ACT NA SUSP: 2.0000 | Freq: Every day | NASAL | Status: DC

## 2013-03-21 MED ORDER — AZITHROMYCIN 250 MG PO TABS: ORAL_TABLET | ORAL | Status: DC

## 2013-04-15 ENCOUNTER — Emergency Department (HOSPITAL_COMMUNITY)
Admission: EM | Admit: 2013-04-15 | Discharge: 2013-04-15 | Disposition: A | Discharge status: Home / Self Care | Payer: No Typology Code available for payment source

## 2013-04-15 ENCOUNTER — Telehealth: Payer: Self-pay | Admitting: *Deleted

## 2013-04-15 NOTE — ED Notes (Signed)
Called for pt multiple times, no answer..

## 2013-04-15 NOTE — Telephone Encounter (Signed)
Patient came to clinic today and advised he was seen in the Ed at [facility] 04/14/13 and advised to see his doctor as they found something on his kidney. Patient states that he was told he should have an MRI.  He brought in the discharge summary for Dr Orvan Falconer to review and the labs. Advised the patient Dr Orvan Falconer is out of the office until Monday 04/20/13 but that I will send him a note and make sure he sees the paperwork when he comes back to clinic. The patient was given an appt for 04/28/13 at 930 am to see Dr Orvan Falconer and I told him if we need him in earlier we would call him.

## 2013-04-20 NOTE — Telephone Encounter (Signed)
9/30 should work.

## 2013-04-28 ENCOUNTER — Encounter: Payer: Self-pay | Admitting: Internal Medicine

## 2013-04-28 ENCOUNTER — Ambulatory Visit (INDEPENDENT_AMBULATORY_CARE_PROVIDER_SITE_OTHER): Payer: Self-pay | Admitting: Internal Medicine

## 2013-04-28 VITALS — BP 137/91 | HR 90 | Temp 98.2°F | Ht 65.0 in | Wt 231.0 lb

## 2013-04-28 DIAGNOSIS — Z23 Encounter for immunization: Secondary | ICD-10-CM

## 2013-04-28 DIAGNOSIS — B20 Human immunodeficiency virus [HIV] disease: Secondary | ICD-10-CM

## 2013-04-28 LAB — COMPREHENSIVE METABOLIC PANEL
ALT: 38 U/L (ref 0–53)
Albumin: 3.9 g/dL (ref 3.5–5.2)
Alkaline Phosphatase: 91 U/L (ref 39–117)
CO2: 23 mEq/L (ref 19–32)
Glucose, Bld: 84 mg/dL (ref 70–99)
Potassium: 4.2 mEq/L (ref 3.5–5.3)
Sodium: 140 mEq/L (ref 135–145)
Total Bilirubin: 0.2 mg/dL — ABNORMAL LOW (ref 0.3–1.2)
Total Protein: 6.7 g/dL (ref 6.0–8.3)

## 2013-04-28 NOTE — Addendum Note (Signed)
Addended by: Andree Coss on: 04/28/2013 12:55 PM   Modules accepted: Orders

## 2013-04-28 NOTE — Progress Notes (Signed)
Patient ID: Drew Cuevas, male   DOB: Aug 31, 1970, 42 y.o.   MRN: [id number]          [facility]  Patient Active Problem List   Diagnosis Date Noted  . HIV DISEASE 08/28/2006    Priority: High  . Essential hypertension, benign 04/28/2008    Priority: Medium  . Obesity (BMI 35.0-39.9 without comorbidity) 05/26/2012  . Diarrhea 11/07/2011  . Right knee pain 04/30/2011  . Recurrent boils 06/10/2009  . INTERNAL HEMORRHOIDS WITHOUT MENTION COMP 12/01/2008  . DEPRESSION, ACUTE 12/20/2006    Patient's Medications  New Prescriptions   No medications on file  Previous Medications   ALBUTEROL (PROVENTIL HFA;VENTOLIN HFA) 108 (90 BASE) MCG/ACT INHALER    Inhale 2 puffs into the lungs every 6 (six) hours as needed for wheezing. Shortness of breath   EFAVIRENZ-EMTRICITABINE-TENOFOVIR (ATRIPLA) 600-200-300 MG PER TABLET    Take 1 tablet by mouth at bedtime.   FLUTICASONE (FLONASE) 50 MCG/ACT NASAL SPRAY    Place 2 sprays into the nose daily.   GUAIFENESIN (MUCINEX) 600 MG 12 HR TABLET    Take 1,200 mg by mouth 2 (two) times daily as needed for congestion.   LORATADINE (CLARITIN) 10 MG TABLET    Take 1 tablet (10 mg total) by mouth daily.   ONDANSETRON (ZOFRAN ODT) 8 MG DISINTEGRATING TABLET    Take 1 tablet (8 mg total) by mouth every 8 (eight) hours as needed for nausea.  Modified Medications   No medications on file  Discontinued Medications   AZITHROMYCIN (ZITHROMAX Z-PAK) 250 MG TABLET    2 po day one, then 1 daily x 4 days    Subjective: Changes seen on a work in basis. He went to [facility] emergency department recently because of some epigastric discomfort. Lab work showed that he had some elevation of his creatinine which has been seen here are previously and he also had some elevation of his liver transaminase results. The scan was done of his abdomen which showed a questionable cyst on his kidney. He was treated with Phenergan, Protonix and  hydrocodone without obvious benefit. He still has his chronic nausea but has gained quite a bit of weight recently. He has not had any vomiting, constipation or diarrhea. He has not had any fever. He has not missed any doses of his Atripla. Review of Systems: Pertinent items are noted in HPI.  Past Medical History  Diagnosis Date  . Hypertension   . HIV (human immunodeficiency virus infection)   . Immune deficiency disorder     History  Substance Use Topics  . Smoking status: Current Some Day Smoker -- 0.01 packs/day    Types: Cigarettes    Last Attempt to Quit: 05/05/2012  . Smokeless tobacco: Never Used  . Alcohol Use: No    No family history on file.  Allergies  Allergen Reactions  . Ibuprofen     REACTION: SWELLING  . Penicillins Hives    Objective: Temp: 98.2 F (36.8 C) (09/30 0924) Temp src: Oral (09/30 0924) BP: 137/91 mmHg (09/30 0924) Pulse Rate: 90 (09/30 0924)  General: He is in no distress. His weight is up 18 pounds to 231. His BMI is over 38  Oral: No oropharyngeal lesions Skin: No rash Lungs: Clear Cor: Regular S1 and S2 no murmurs Abdomen: Central obesity is unchanged Joints and extremities: No acute abnormalities Neuro: Alert with normal speech and conversation Mood and affect: His affect is slightly flat his mood is  terse  Lab Results HIV 1 RNA Quant (copies/mL)  Date Value  11/13/2012 <20   02/19/2012 <20   11/07/2011 <20      CD4 T Cell Abs (cmm)  Date Value  11/13/2012 460   02/19/2012 680   11/07/2011 720      Assessment: His HIV remains under excellent control but I will need repeat blood work today.  I do not know the cause of his nausea or abdominal pain. He is adamantly opposed to talking about any problems related to his weight. He does not 1 nutritional counseling and says that he eats a very healthy diet all of his family is overweight. I suggested that he try to establish a primary care provider.  Plan: 1. Continue  Atripla 2. Influenza vaccination today 3. Repeat his CD4, viral load and complete metabolic panel 4. Establish primary care   Cliffton Asters, MD [facility] [phone] pager   [phone] cell 04/28/2013, 9:50 AM

## 2013-04-29 ENCOUNTER — Encounter: Payer: Self-pay | Admitting: *Deleted

## 2013-04-29 LAB — HIV-1 RNA QUANT-NO REFLEX-BLD: HIV-1 RNA Quant, Log: <1.3 {Log} (ref <1.30)

## 2013-04-29 LAB — T-HELPER CELL (CD4) - (RCID CLINIC ONLY)
CD4 % Helper T Cell: 26 % — ABNORMAL LOW (ref 33–55)
CD4 T Cell Abs: 580 /uL (ref 400–2700)

## 2013-04-29 NOTE — Progress Notes (Signed)
Patient ID: Drew Cuevas, male   DOB: 24-Jan-1971, 42 y.o.   MRN: [id number] Pt brought in [facility] lab work results and abdominal ultrasound results.  Pt requesting MRI to follow up on ultrasound results.  Pt has current [facility] card.  MD please advise.

## 2013-04-29 NOTE — Progress Notes (Signed)
Patient ID: Drew Cuevas, male   DOB: May 24, 1971, 42 y.o.   MRN: [id number] Please let Mr. Rozario know that we are working on a primary care referral to address the need for further imaging and to address his other primary care needs.  Cliffton Asters, MD [facility] [phone] pager   [phone] cell 04/29/2013, 3:43 PM

## 2013-04-29 NOTE — Progress Notes (Signed)
Patient ID: Drew Cuevas, male   DOB: 12-May-1971, 42 y.o.   MRN: [id number]

## 2013-04-30 ENCOUNTER — Telehealth: Payer: Self-pay | Admitting: *Deleted

## 2013-04-30 NOTE — Telephone Encounter (Signed)
Pt given name and address for [facility] to establish PCP.  Pt verbalized understanding.  Will take notes for [facility] ED visit when he goes to establish care.

## 2013-05-21 ENCOUNTER — Ambulatory Visit: Payer: No Typology Code available for payment source | Attending: Internal Medicine | Admitting: Internal Medicine

## 2013-05-21 ENCOUNTER — Encounter: Payer: Self-pay | Admitting: Internal Medicine

## 2013-05-21 VITALS — BP 148/105 | HR 92 | Temp 98.1°F | Resp 16 | Ht 67.5 in | Wt 232.0 lb

## 2013-05-21 DIAGNOSIS — N281 Cyst of kidney, acquired: Secondary | ICD-10-CM | POA: Insufficient documentation

## 2013-05-21 DIAGNOSIS — I10 Essential (primary) hypertension: Secondary | ICD-10-CM

## 2013-05-21 DIAGNOSIS — Q619 Cystic kidney disease, unspecified: Secondary | ICD-10-CM

## 2013-05-21 DIAGNOSIS — B20 Human immunodeficiency virus [HIV] disease: Secondary | ICD-10-CM

## 2013-05-21 MED ORDER — AMLODIPINE BESYLATE 5 MG PO TABS: 5.0000 mg | ORAL_TABLET | Freq: Every day | ORAL | Status: DC

## 2013-05-21 NOTE — Patient Instructions (Signed)

## 2013-05-21 NOTE — Progress Notes (Signed)
HFU Pt was admitted to the hospital for a cyst on his kidneys. Pt is HIV positive and is on ATRIPLA. Pt also has HTN.

## 2013-05-21 NOTE — Progress Notes (Signed)
Patient ID: Drew Cuevas, male   DOB: 03/09/1971, 42 y.o.   MRN: [id number] Patient Demographics  Drew Cuevas, is a 42 y.o. male  WUJ:[id number]  NFA:[id number]  DOB - 21-May-1971  CC:  Chief Complaint  Patient presents with  . Hospitalization Follow-up       HPI: Drew Cuevas is a 42 y.o. male here today to establish medical care. Patient has history of HIV diagnosed since 2008, currently in excellent state infectious disease specialist, he was recently seen in an urgent care for nausea and abdominal pain, ultrasound of the abdomen revealed a cyst in the right kidney that has enlarged since previously noted in 2000, it was suggested that an MRI of the abdomen done to properly define the mass. Patient does not have a primary care physician to order this test hence the referral to this clinic. He has no new complaints today. He has no urinary symptoms. He still has occasional pain in the abdomen, between 3 and 5/10. No fever.  He also remembered having been diagnosed with hypertension in the past and was started on hydrochlorothiazide, he claimed that he physician took him off of medication when he came out of prison > 5 years ago and has since not been on any medication. Blood pressure today is high and he said every time he goes to the infectious disease appointment, his blood pressure was noted to be on the high side. He smokes about one pack of cigarette per day, and he drinks alcohol occasionally. Patient has No headache, No chest pain, No abdominal pain - No Nausea, No new weakness tingling or numbness, No Cough - SOB.  Allergies  Allergen Reactions  . Ibuprofen     REACTION: SWELLING  . Penicillins Hives   Past Medical History  Diagnosis Date  . Hypertension   . HIV (human immunodeficiency virus infection)   . Immune deficiency disorder    Current Outpatient Prescriptions on File Prior to Visit  Medication Sig Dispense Refill  . efavirenz-emtricitabine-tenofovir (ATRIPLA)  600-200-300 MG per tablet Take 1 tablet by mouth at bedtime.      Marland Kitchen albuterol (PROVENTIL HFA;VENTOLIN HFA) 108 (90 BASE) MCG/ACT inhaler Inhale 2 puffs into the lungs every 6 (six) hours as needed for wheezing. Shortness of breath      . fluticasone (FLONASE) 50 MCG/ACT nasal spray Place 2 sprays into the nose daily.  16 g  0  . guaiFENesin (MUCINEX) 600 MG 12 hr tablet Take 1,200 mg by mouth 2 (two) times daily as needed for congestion.      Marland Kitchen loratadine (CLARITIN) 10 MG tablet Take 1 tablet (10 mg total) by mouth daily.  10 tablet  0  . ondansetron (ZOFRAN ODT) 8 MG disintegrating tablet Take 1 tablet (8 mg total) by mouth every 8 (eight) hours as needed for nausea.  20 tablet  0   No current facility-administered medications on file prior to visit.   Family History  Problem Relation Age of Onset  . Hypertension Mother   . Hypertension Sister   . Hypertension Brother    History   Social History  . Marital Status: Single    Spouse Name: N/A    Number of Children: N/A  . Years of Education: N/A   Occupational History  . Not on file.   Social History Main Topics  . Smoking status: Former Smoker -- 0.01 packs/day    Types: Cigarettes    Quit date: 05/06/2011  . Smokeless tobacco: Never Used  . Alcohol  Use: No  . Drug Use: No  . Sexual Activity: Yes    Partners: Male     Comment: declined condoms   Other Topics Concern  . Not on file   Social History Narrative  . No narrative on file    Review of Systems: Constitutional: Negative for fever, chills, diaphoresis, activity change, appetite change and fatigue. HENT: Negative for ear pain, nosebleeds, congestion, facial swelling, rhinorrhea, neck pain, neck stiffness and ear discharge.  Eyes: Negative for pain, discharge, redness, itching and visual disturbance. Respiratory: Negative for cough, choking, chest tightness, shortness of breath, wheezing and stridor.  Cardiovascular: Negative for chest pain, palpitations and leg  swelling. Gastrointestinal: Negative for abdominal distention. Genitourinary: Negative for dysuria, urgency, frequency, hematuria, flank pain, decreased urine volume, difficulty urinating and dyspareunia.  Musculoskeletal: Negative for back pain, joint swelling, arthralgia and gait problem. Neurological: Negative for dizziness, tremors, seizures, syncope, facial asymmetry, speech difficulty, weakness, light-headedness, numbness and headaches.  Hematological: Negative for adenopathy. Does not bruise/bleed easily. Psychiatric/Behavioral: Negative for hallucinations, behavioral problems, confusion, dysphoric mood, decreased concentration and agitation.    Objective:   Filed Vitals:   05/21/13 1426  BP: 148/105  Pulse: 92  Temp: 98.1 F (36.7 C)  Resp: 16    Physical Exam: Constitutional: Patient appears well-developed and well-nourished. No distress. HENT: Normocephalic, atraumatic, External right and left ear normal. Oropharynx is clear and moist.  Eyes: Conjunctivae and EOM are normal. PERRLA, no scleral icterus. Neck: Normal ROM. Neck supple. No JVD. No tracheal deviation. No thyromegaly. CVS: RRR, S1/S2 +, no murmurs, no gallops, no carotid bruit.  Pulmonary: Effort and breath sounds normal, no stridor, rhonchi, wheezes, rales.  Abdominal: Soft. BS +, no distension, tenderness, rebound or guarding.  Musculoskeletal: Normal range of motion. No edema and no tenderness.  Lymphadenopathy: No lymphadenopathy noted, cervical, inguinal or axillary Neuro: Alert. Normal reflexes, muscle tone coordination. No cranial nerve deficit. Skin: Skin is warm and dry. No rash noted. Not diaphoretic. No erythema. No pallor. Psychiatric: Normal mood and affect. Behavior, judgment, thought content normal.  Lab Results  Component Value Date   WBC 6.1 12/04/2012   HGB 13.9 12/04/2012   HCT 37.8* 12/04/2012   MCV 90.0 12/04/2012   PLT 176 12/04/2012   Lab Results  Component Value Date   CREATININE 1.34  04/28/2013   BUN 11 04/28/2013   NA 140 04/28/2013   K 4.2 04/28/2013   CL 110 04/28/2013   CO2 23 04/28/2013    No results found for this basename: HGBA1C   Lipid Panel     Component Value Date/Time   CHOL 173 11/13/2012 0957   TRIG 97 11/13/2012 0957   HDL 31* 11/13/2012 0957   CHOLHDL 5.6 11/13/2012 0957   VLDL 19 11/13/2012 0957   LDLCALC 123* 11/13/2012 0957       Assessment and plan:   Patient Active Problem List   Diagnosis Date Noted  . HIV disease 05/21/2013  . Renal cyst 05/21/2013  . Obesity (BMI 35.0-39.9 without comorbidity) 05/26/2012  . Diarrhea 11/07/2011  . Right knee pain 04/30/2011  . Recurrent boils 06/10/2009  . INTERNAL HEMORRHOIDS WITHOUT MENTION COMP 12/01/2008  . Essential hypertension, benign 04/28/2008  . DEPRESSION, ACUTE 12/20/2006  . HIV DISEASE 08/28/2006    Plan: Right Renal cyst MRI of the abdomen with contrast  HIV Continue ATRIPLA as prescribed by infectious disease specialist  Hypertension Start Norvasc 5 mg tablet by mouth daily, avoiding diuretic because of suboptimal kidney function Return in  2 weeks for blood pressure check DASH diet  Patient has been extensively counseled about smoking cessation Patient was counseled on Nutritional and exercise      Health Maintenance -Vaccinations:  -Influenza: Already given  Follow up in 3 months or when necessary  The patient was given clear instructions to go to ER or return to medical center if symptoms don't improve, worsen or new problems develop. The patient verbalized understanding. The patient was told to call to get lab results if they haven't heard anything in the next week.     Jeanann Lewandowsky, MD, MHA, FACP, FAAP [facility] [place], [place] [phone]   05/21/2013, 2:54 PM

## 2013-05-25 ENCOUNTER — Ambulatory Visit (HOSPITAL_COMMUNITY)
Admission: RE | Admit: 2013-05-25 | Discharge: 2013-05-25 | Disposition: A | Discharge status: Home / Self Care | Payer: No Typology Code available for payment source | Source: Ambulatory Visit | Attending: Internal Medicine | Admitting: Internal Medicine

## 2013-05-25 DIAGNOSIS — N281 Cyst of kidney, acquired: Secondary | ICD-10-CM

## 2013-05-25 DIAGNOSIS — N289 Disorder of kidney and ureter, unspecified: Secondary | ICD-10-CM | POA: Insufficient documentation

## 2013-05-25 DIAGNOSIS — Q6101 Congenital single renal cyst: Secondary | ICD-10-CM | POA: Insufficient documentation

## 2013-05-25 MED ORDER — GADOBENATE DIMEGLUMINE 529 MG/ML IV SOLN
20.0000 mL | Freq: Once | INTRAVENOUS | Status: AC
  Administered 2013-05-25: 20 mL via INTRAVENOUS

## 2013-05-26 ENCOUNTER — Other Ambulatory Visit: Payer: Self-pay | Admitting: Internal Medicine

## 2013-05-26 ENCOUNTER — Telehealth: Payer: Self-pay | Admitting: Emergency Medicine

## 2013-05-26 DIAGNOSIS — N2889 Other specified disorders of kidney and ureter: Secondary | ICD-10-CM

## 2013-05-26 NOTE — Telephone Encounter (Signed)
Message copied by Darlis Loan on Tue May 26, 2013 10:27 AM ------      Message from: Quentin Angst      Created: Tue May 26, 2013  9:55 AM       Please call to inform patient that his MRI showed that the right kidney mass is concerning, we need to send him to a urologist ------

## 2013-05-26 NOTE — Telephone Encounter (Signed)
Left message on voicemail with pt results. Informed to call clinic for f/u

## 2013-06-02 ENCOUNTER — Other Ambulatory Visit: Payer: Self-pay

## 2013-06-16 ENCOUNTER — Telehealth: Payer: Self-pay | Admitting: *Deleted

## 2013-06-16 ENCOUNTER — Ambulatory Visit: Payer: Self-pay | Admitting: Internal Medicine

## 2013-06-16 NOTE — Telephone Encounter (Signed)
LM requested pt call for new appt.

## 2013-07-02 ENCOUNTER — Emergency Department (HOSPITAL_COMMUNITY)
Admission: EM | Admit: 2013-07-02 | Discharge: 2013-07-02 | Disposition: A | Discharge status: Home / Self Care | Payer: No Typology Code available for payment source | Attending: Emergency Medicine | Admitting: Emergency Medicine

## 2013-07-02 ENCOUNTER — Encounter (HOSPITAL_COMMUNITY): Payer: Self-pay | Admitting: Emergency Medicine

## 2013-07-02 DIAGNOSIS — Z79899 Other long term (current) drug therapy: Secondary | ICD-10-CM | POA: Insufficient documentation

## 2013-07-02 DIAGNOSIS — IMO0002 Reserved for concepts with insufficient information to code with codable children: Secondary | ICD-10-CM | POA: Insufficient documentation

## 2013-07-02 DIAGNOSIS — Z880 Allergy status to penicillin: Secondary | ICD-10-CM | POA: Insufficient documentation

## 2013-07-02 DIAGNOSIS — L0231 Cutaneous abscess of buttock: Secondary | ICD-10-CM | POA: Insufficient documentation

## 2013-07-02 DIAGNOSIS — Z8639 Personal history of other endocrine, nutritional and metabolic disease: Secondary | ICD-10-CM | POA: Insufficient documentation

## 2013-07-02 DIAGNOSIS — Z87891 Personal history of nicotine dependence: Secondary | ICD-10-CM | POA: Insufficient documentation

## 2013-07-02 DIAGNOSIS — Z862 Personal history of diseases of the blood and blood-forming organs and certain disorders involving the immune mechanism: Secondary | ICD-10-CM | POA: Insufficient documentation

## 2013-07-02 DIAGNOSIS — I10 Essential (primary) hypertension: Secondary | ICD-10-CM | POA: Insufficient documentation

## 2013-07-02 DIAGNOSIS — Z21 Asymptomatic human immunodeficiency virus [HIV] infection status: Secondary | ICD-10-CM | POA: Insufficient documentation

## 2013-07-02 MED ORDER — SULFAMETHOXAZOLE-TMP DS 800-160 MG PO TABS: 1.0000 | ORAL_TABLET | Freq: Two times a day (BID) | ORAL | Status: DC

## 2013-07-02 MED ORDER — TRAMADOL HCL 50 MG PO TABS: 50.0000 mg | ORAL_TABLET | Freq: Four times a day (QID) | ORAL | Status: DC | PRN

## 2013-07-02 MED ORDER — TRAMADOL HCL 50 MG PO TABS
50.0000 mg | ORAL_TABLET | Freq: Once | ORAL | Status: AC
  Administered 2013-07-02: 50 mg via ORAL
  Filled 2013-07-02: qty 1

## 2013-07-02 NOTE — ED Provider Notes (Signed)
CSN: [id number]     Arrival date & time 07/02/13  1744 History  This chart was scribed for Junius Finner, PA, working with Junius Argyle, MD, by Ardelia Mems ED Scribe. This patient was seen in room WTR6/WTR6 and the patient's care was started at 6:55 PM.  Chief Complaint  Patient presents with  . Abscess    The history is provided by the patient. No language interpreter was used.    HPI Comments: Drew Cuevas is a 42 y.o. male with a history of HIV and HTN who presents to the Emergency Department complaining of a gradually worsening abscess to his gluteal cleft over the past 5 days. He reports associated "10/10, throbbing" pain to the abscess. He also states that he has had itching to the abscess. He denies noticing any drainage from the abscess. He states that he has a history of abscesses to his gluteal cleft 3-4 times as well as to other body areas, last one being about 1 year ago. He states that he has applied warm compresses as well as hydrocortisone without relief. He denies fever, chills, nausea, emesis or any other symptoms. He denies having a history of DM. He states that he will not be driving today.  PCP- Dr. Jeanann Lewandowsky   Past Medical History  Diagnosis Date  . Hypertension   . HIV (human immunodeficiency virus infection)   . Immune deficiency disorder    History reviewed. No pertinent past surgical history. Family History  Problem Relation Age of Onset  . Hypertension Mother   . Hypertension Sister   . Hypertension Brother    History  Substance Use Topics  . Smoking status: Former Smoker -- 0.01 packs/day    Types: Cigarettes    Quit date: 05/06/2011  . Smokeless tobacco: Never Used  . Alcohol Use: No    Review of Systems  Constitutional: Negative for fever and chills.  Gastrointestinal: Negative for nausea and vomiting.  Skin: Positive for wound (abscess).  All other systems reviewed and are negative.   Allergies  Ibuprofen and  Penicillins  Home Medications   Current Outpatient Rx  Name  Route  Sig  Dispense  Refill  . albuterol (PROVENTIL HFA;VENTOLIN HFA) 108 (90 BASE) MCG/ACT inhaler   Inhalation   Inhale 2 puffs into the lungs every 6 (six) hours as needed for wheezing. Shortness of breath         . amLODipine (NORVASC) 5 MG tablet   Oral   Take 1 tablet (5 mg total) by mouth daily.   90 tablet   3   . efavirenz-emtricitabine-tenofovir (ATRIPLA) 600-200-300 MG per tablet   Oral   Take 1 tablet by mouth at bedtime.         . fluticasone (FLONASE) 50 MCG/ACT nasal spray   Nasal   Place 2 sprays into the nose daily.   16 g   0   . guaiFENesin (MUCINEX) 600 MG 12 hr tablet   Oral   Take 1,200 mg by mouth 2 (two) times daily as needed for congestion.         Marland Kitchen loratadine (CLARITIN) 10 MG tablet   Oral   Take 1 tablet (10 mg total) by mouth daily.   10 tablet   0   . ondansetron (ZOFRAN ODT) 8 MG disintegrating tablet   Oral   Take 1 tablet (8 mg total) by mouth every 8 (eight) hours as needed for nausea.   20 tablet   0   .  sulfamethoxazole-trimethoprim (BACTRIM DS) 800-160 MG per tablet   Oral   Take 1 tablet by mouth 2 (two) times daily.   20 tablet   0   . traMADol (ULTRAM) 50 MG tablet   Oral   Take 1 tablet (50 mg total) by mouth every 6 (six) hours as needed.   15 tablet   0    Triage Vitals: BP 136/87  Pulse 101  Temp(Src) 98.3 F (36.8 C) (Oral)  Resp 20  SpO2 97%  Physical Exam  Nursing note and vitals reviewed. Constitutional: He appears well-developed and well-nourished.  HENT:  Head: Normocephalic and atraumatic.  Eyes: Conjunctivae are normal. No scleral icterus.  Neck: Normal range of motion.  Cardiovascular: Normal rate, regular rhythm and normal heart sounds.   Pulmonary/Chest: Effort normal and breath sounds normal. No respiratory distress. He has no wheezes. He has no rales. He exhibits no tenderness.  Abdominal: Soft. Bowel sounds are normal. He  exhibits no distension and no mass. There is no tenderness. There is no rebound and no guarding.  Genitourinary:  Right buttock next to gluteal cleft: 2 cm x 3 cm area of induration. No red streaking or active drainage.  Musculoskeletal: Normal range of motion.  Neurological: He is alert.  Skin: Skin is warm and dry.    ED Course  Procedures (including critical care time)  INCISION AND DRAINAGE Performed by: Junius Finner, PA Consent: Verbal consent obtained. Risks and benefits: risks, benefits and alternatives were discussed Type: abscess  Body area: gluteal cleft  Anesthesia: local infiltration  Incision was made with a scalpel.  Local anesthetic: 1% xylocaine with epinephrine  Anesthetic total: 2 ml  Complexity: complex Blunt dissection to break up loculations  Drainage: bloody  Drainage amount: scant  Packing material: 1/4 in iodoform gauze  Patient tolerance: Patient tolerated the procedure well with no immediate complications.   DIAGNOSTIC STUDIES: Oxygen Saturation is 97% on RA, normal by my interpretation.    COORDINATION OF CARE: 7:00 PM- Discussed plan for incision and drainage. Pt advised of plan for treatment and pt agrees.  7:06 PM- Performed I&D on abscess.  Medications  traMADol (ULTRAM) tablet 50 mg (not administered)   Labs Review Labs Reviewed - No data to display Imaging Review No results found.  EKG Interpretation   None       MDM   1. Abscess, gluteal, right    Pt presenting with gluteal abscess, hx of same. Pt appears well, non-toxic. I&D performed. See procedure note.  Rx: bactrim and tramadol. Advised to follow up in 2 days for packing change and wound recheck.  Return precautions provided. Pt verbalized understanding and agreement with tx plan.  I personally performed the services described in this documentation, which was scribed in my presence. The recorded information has been reviewed and is accurate.    Junius Finner, PA-C 07/03/13 1342

## 2013-07-02 NOTE — ED Notes (Signed)
Pt presents with c/o abscess in the crack of his buttocks. Hx of same.

## 2013-07-02 NOTE — ED Notes (Signed)
Pt presents today with an abscess right below the waist that has been present for five days. Pt is A/O x4 and in NAD.

## 2013-07-03 NOTE — ED Provider Notes (Signed)
Medical screening examination/treatment/procedure(s) were performed by non-physician practitioner and as supervising physician I was immediately available for consultation/collaboration.  EKG Interpretation   None         Junius Argyle, MD 07/03/13 [phone]

## 2013-07-17 ENCOUNTER — Other Ambulatory Visit: Payer: Self-pay | Admitting: Licensed Clinical Social Worker

## 2013-07-17 DIAGNOSIS — B20 Human immunodeficiency virus [HIV] disease: Secondary | ICD-10-CM

## 2013-07-17 MED ORDER — EFAVIRENZ-EMTRICITAB-TENOFOVIR 600-200-300 MG PO TABS: 1.0000 | ORAL_TABLET | Freq: Every day | ORAL | Status: DC

## 2013-07-27 ENCOUNTER — Other Ambulatory Visit: Payer: Self-pay | Admitting: Urology

## 2013-07-28 ENCOUNTER — Other Ambulatory Visit: Payer: Self-pay | Admitting: Urology

## 2013-07-28 DIAGNOSIS — C661 Malignant neoplasm of right ureter: Secondary | ICD-10-CM

## 2013-08-07 ENCOUNTER — Ambulatory Visit: Payer: Self-pay

## 2013-08-11 ENCOUNTER — Encounter (HOSPITAL_COMMUNITY): Payer: Self-pay | Admitting: Pharmacy Technician

## 2013-08-14 ENCOUNTER — Encounter (HOSPITAL_COMMUNITY): Payer: Self-pay

## 2013-08-14 ENCOUNTER — Encounter (HOSPITAL_COMMUNITY)
Admission: RE | Admit: 2013-08-14 | Discharge: 2013-08-14 | Disposition: A | Discharge status: Home / Self Care | Payer: No Typology Code available for payment source | Source: Ambulatory Visit | Attending: Urology | Admitting: Urology

## 2013-08-14 ENCOUNTER — Ambulatory Visit: Payer: No Typology Code available for payment source

## 2013-08-14 DIAGNOSIS — Z01812 Encounter for preprocedural laboratory examination: Secondary | ICD-10-CM | POA: Insufficient documentation

## 2013-08-14 DIAGNOSIS — Z01810 Encounter for preprocedural cardiovascular examination: Secondary | ICD-10-CM | POA: Insufficient documentation

## 2013-08-14 DIAGNOSIS — Z0183 Encounter for blood typing: Secondary | ICD-10-CM | POA: Insufficient documentation

## 2013-08-14 HISTORY — DX: Gastro-esophageal reflux disease without esophagitis: K21.9

## 2013-08-14 HISTORY — DX: Bronchitis, not specified as acute or chronic: J40

## 2013-08-14 HISTORY — DX: Other specified disorders of kidney and ureter: N28.89

## 2013-08-14 LAB — CBC
HEMATOCRIT: 41.8 % (ref 39.0–52.0)
HEMOGLOBIN: 14.3 g/dL (ref 13.0–17.0)
MCH: 32.5 pg (ref 26.0–34.0)
MCHC: 34.2 g/dL (ref 30.0–36.0)
MCV: 95 fL (ref 78.0–100.0)
Platelets: 197 10*3/uL (ref 150–400)
RBC: 4.4 MIL/uL (ref 4.22–5.81)
RDW: 13.4 % (ref 11.5–15.5)
WBC: 5.2 10*3/uL (ref 4.0–10.5)

## 2013-08-14 LAB — COMPREHENSIVE METABOLIC PANEL
ALT: 53 U/L (ref 0–53)
AST: 31 U/L (ref 0–37)
Albumin: 3.6 g/dL (ref 3.5–5.2)
Alkaline Phosphatase: 99 U/L (ref 39–117)
BUN: 8 mg/dL (ref 6–23)
CALCIUM: 8.6 mg/dL (ref 8.4–10.5)
CO2: 24 mEq/L (ref 19–32)
Chloride: 105 mEq/L (ref 96–112)
Creatinine, Ser: 1.19 mg/dL (ref 0.50–1.35)
GFR calc non Af Amer: 74 mL/min — ABNORMAL LOW (ref >90)
GFR, EST AFRICAN AMERICAN: 86 mL/min — AB (ref >90)
GLUCOSE: 81 mg/dL (ref 70–99)
Potassium: 4.3 mEq/L (ref 3.7–5.3)
Sodium: 141 mEq/L (ref 137–147)
Total Bilirubin: 0.3 mg/dL (ref 0.3–1.2)
Total Protein: 7.1 g/dL (ref 6.0–8.3)

## 2013-08-14 LAB — ABO/RH: ABO/RH(D): O NEG

## 2013-08-14 NOTE — Pre-Procedure Instructions (Signed)
CXR REPORT IS IN EPIC FROM 03/21/13. EKG REPEATED TODAY PREOP AT [facility] - PREVIOUS EKG IN EPIC FROM 03/20/13 PT'S HEART RATE 110.  TODAY'S EKG RATE IS 87.

## 2013-08-14 NOTE — Progress Notes (Signed)
08/14/13 1430  OBSTRUCTIVE SLEEP APNEA  Have you ever been diagnosed with sleep apnea through a sleep study? No  Do you snore loudly (loud enough to be heard through closed doors)?  1  Do you often feel tired, fatigued, or sleepy during the daytime? 0  Has anyone observed you stop breathing during your sleep? 0  Do you have, or are you being treated for high blood pressure? 1  BMI more than 35 kg/m2? 1  Age over 42 years old? 0  Neck circumference greater than 40 cm/18 inches? 0  Gender: 1  Obstructive Sleep Apnea Score 4  Score 4 or greater  Results sent to PCP

## 2013-08-14 NOTE — Patient Instructions (Signed)
YOUR SURGERY IS SCHEDULED AT [facility]  ON:  Wednesday  1/21  REPORT TO  SHORT STAY CENTER AT:  10:45 AM      PHONE # FOR SHORT STAY IS [phone]  FOLLOW BOWEL PREP INSTRUCTIONS FROM DR. HERRICK'S OFFICE - DAY BEFORE YOUR SURGERY.  DO NOT EAT ANYTHING AFTER MIDNIGHT THE NIGHT BEFORE YOUR SURGERY.  YOU MAY BRUSH YOUR TEETH, RINSE OUT YOUR MOUTH- -  NO FOOD, NO CHEWING GUM, NO MINTS, NO CANDIES, NO CHEWING TOBACCO.  YOU MAY HAVE CLEAR LIQUIDS TO DRINK FROM MIDNIGHT UNTIL 6:45 AM DAY OF YOUR SURGERY  - LIKE WATER, SODA.  NOTHING TO DRINK AFTER 6:45 AM THE DAY OF SURGERY.  PLEASE TAKE THE FOLLOWING MEDICATIONS THE AM OF YOUR SURGERY WITH A FEW SIPS OF WATER:  NO MEDS TO TAKS  .  DO NOT BRING VALUABLES, MONEY, CREDIT CARDS.  DO NOT WEAR JEWELRY, MAKE-UP, NAIL POLISH AND NO METAL PINS OR CLIPS IN YOUR HAIR. CONTACT LENS, DENTURES / PARTIALS, GLASSES SHOULD NOT BE WORN TO SURGERY AND IN MOST CASES-HEARING AIDS WILL NEED TO BE REMOVED.  BRING YOUR GLASSES CASE, ANY EQUIPMENT NEEDED FOR YOUR CONTACT LENS. FOR PATIENTS ADMITTED TO THE HOSPITAL--CHECK OUT TIME THE DAY OF DISCHARGE IS 11:00 AM.  ALL INPATIENT ROOMS ARE PRIVATE - WITH BATHROOM, TELEPHONE, TELEVISION AND WIFI INTERNET.                                                     PLEASE READ OVER ANY  FACT SHEETS THAT YOU WERE GIVEN:  BLOOD TRANSFUSION INFORMATION  FAILURE TO FOLLOW THESE INSTRUCTIONS MAY RESULT IN THE CANCELLATION OF YOUR SURGERY. PLEASE BE AWARE THAT YOU MAY NEED ADDITIONAL BLOOD DRAWN DAY OF YOUR SURGERY  PATIENT SIGNATURE_________________________________

## 2013-08-17 ENCOUNTER — Other Ambulatory Visit: Payer: Self-pay | Admitting: *Deleted

## 2013-08-17 DIAGNOSIS — B20 Human immunodeficiency virus [HIV] disease: Secondary | ICD-10-CM

## 2013-08-17 MED ORDER — EFAVIRENZ-EMTRICITAB-TENOFOVIR 600-200-300 MG PO TABS: 1.0000 | ORAL_TABLET | Freq: Every day | ORAL | Status: DC

## 2013-08-19 ENCOUNTER — Inpatient Hospital Stay (HOSPITAL_COMMUNITY): Payer: No Typology Code available for payment source | Admitting: Anesthesiology

## 2013-08-19 ENCOUNTER — Observation Stay (HOSPITAL_COMMUNITY)
Admission: RE | Admit: 2013-08-19 | Discharge: 2013-08-21 | Disposition: A | Discharge status: Home / Self Care | Payer: No Typology Code available for payment source | Source: Ambulatory Visit | Attending: Urology | Admitting: Urology

## 2013-08-19 ENCOUNTER — Encounter (HOSPITAL_COMMUNITY): Payer: No Typology Code available for payment source | Admitting: Anesthesiology

## 2013-08-19 ENCOUNTER — Encounter (HOSPITAL_COMMUNITY)
Admission: RE | Disposition: A | Discharge status: Home / Self Care | Payer: Self-pay | Source: Ambulatory Visit | Attending: Urology

## 2013-08-19 ENCOUNTER — Encounter (HOSPITAL_COMMUNITY): Payer: Self-pay | Admitting: *Deleted

## 2013-08-19 DIAGNOSIS — Z21 Asymptomatic human immunodeficiency virus [HIV] infection status: Secondary | ICD-10-CM | POA: Insufficient documentation

## 2013-08-19 DIAGNOSIS — Z79899 Other long term (current) drug therapy: Secondary | ICD-10-CM | POA: Insufficient documentation

## 2013-08-19 DIAGNOSIS — C641 Malignant neoplasm of right kidney, except renal pelvis: Secondary | ICD-10-CM | POA: Diagnosis present

## 2013-08-19 DIAGNOSIS — C649 Malignant neoplasm of unspecified kidney, except renal pelvis: Principal | ICD-10-CM | POA: Insufficient documentation

## 2013-08-19 DIAGNOSIS — N2889 Other specified disorders of kidney and ureter: Secondary | ICD-10-CM

## 2013-08-19 DIAGNOSIS — Q619 Cystic kidney disease, unspecified: Secondary | ICD-10-CM | POA: Insufficient documentation

## 2013-08-19 DIAGNOSIS — I10 Essential (primary) hypertension: Secondary | ICD-10-CM | POA: Insufficient documentation

## 2013-08-19 HISTORY — PX: ROBOT ASSISTED LAPAROSCOPIC NEPHRECTOMY: SHX5140

## 2013-08-19 LAB — TYPE AND SCREEN
ABO/RH(D): O NEG
ANTIBODY SCREEN: NEGATIVE

## 2013-08-19 LAB — CBC
HCT: 39.8 % (ref 39.0–52.0)
Hemoglobin: 13.9 g/dL (ref 13.0–17.0)
MCH: 33.1 pg (ref 26.0–34.0)
MCHC: 34.9 g/dL (ref 30.0–36.0)
MCV: 94.8 fL (ref 78.0–100.0)
PLATELETS: 201 10*3/uL (ref 150–400)
RBC: 4.2 MIL/uL — ABNORMAL LOW (ref 4.22–5.81)
RDW: 13.3 % (ref 11.5–15.5)
WBC: 11.7 10*3/uL — AB (ref 4.0–10.5)

## 2013-08-19 LAB — BASIC METABOLIC PANEL
BUN: 6 mg/dL (ref 6–23)
CALCIUM: 8.3 mg/dL — AB (ref 8.4–10.5)
CO2: 23 mEq/L (ref 19–32)
CREATININE: 1.23 mg/dL (ref 0.50–1.35)
Chloride: 105 mEq/L (ref 96–112)
GFR calc Af Amer: 83 mL/min — ABNORMAL LOW (ref >90)
GFR, EST NON AFRICAN AMERICAN: 71 mL/min — AB (ref >90)
Glucose, Bld: 110 mg/dL — ABNORMAL HIGH (ref 70–99)
Potassium: 4.4 mEq/L (ref 3.7–5.3)
SODIUM: 138 meq/L (ref 137–147)

## 2013-08-19 SURGERY — ROBOTIC ASSISTED LAPAROSCOPIC NEPHRECTOMY
Anesthesia: General | Site: Flank | Laterality: Right

## 2013-08-19 MED ORDER — HYDROMORPHONE HCL PF 1 MG/ML IJ SOLN
0.2500 mg | INTRAMUSCULAR | Status: DC | PRN
Start: 2013-08-19 — End: 2013-08-19

## 2013-08-19 MED ORDER — OXYCODONE HCL 5 MG PO TABS
5.0000 mg | ORAL_TABLET | ORAL | Status: DC | PRN
  Administered 2013-08-19 – 2013-08-21 (×4): 15 mg via ORAL
  Filled 2013-08-19 (×4): qty 3

## 2013-08-19 MED ORDER — GLYCOPYRROLATE 0.2 MG/ML IJ SOLN
INTRAMUSCULAR | Status: AC
  Filled 2013-08-19: qty 4

## 2013-08-19 MED ORDER — LIDOCAINE HCL (CARDIAC) 20 MG/ML IV SOLN
INTRAVENOUS | Status: DC | PRN
  Administered 2013-08-19: 50 mg via INTRAVENOUS

## 2013-08-19 MED ORDER — PROPOFOL 10 MG/ML IV BOLUS
INTRAVENOUS | Status: AC
  Filled 2013-08-19: qty 20

## 2013-08-19 MED ORDER — NEOSTIGMINE METHYLSULFATE 1 MG/ML IJ SOLN
INTRAMUSCULAR | Status: DC | PRN
  Administered 2013-08-19: 4 mg via INTRAVENOUS

## 2013-08-19 MED ORDER — LACTATED RINGERS IR SOLN
Status: DC | PRN
  Administered 2013-08-19: 1000 mL

## 2013-08-19 MED ORDER — HEPARIN SODIUM (PORCINE) 1000 UNIT/ML IJ SOLN
INTRAMUSCULAR | Status: AC
  Filled 2013-08-19: qty 1

## 2013-08-19 MED ORDER — MANNITOL 25 % IV SOLN
25.0000 g | Freq: Once | INTRAVENOUS | Status: AC
  Administered 2013-08-19 (×2): 12.5 g via INTRAVENOUS
  Filled 2013-08-19: qty 100

## 2013-08-19 MED ORDER — HYDROMORPHONE HCL PF 1 MG/ML IJ SOLN
0.5000 mg | INTRAMUSCULAR | Status: DC | PRN
  Administered 2013-08-20 – 2013-08-21 (×9): 1 mg via INTRAVENOUS
  Filled 2013-08-19 (×8): qty 1

## 2013-08-19 MED ORDER — ROCURONIUM BROMIDE 100 MG/10ML IV SOLN
INTRAVENOUS | Status: AC
  Filled 2013-08-19: qty 1

## 2013-08-19 MED ORDER — HYDROMORPHONE HCL PF 2 MG/ML IJ SOLN
INTRAMUSCULAR | Status: AC
  Filled 2013-08-19: qty 1

## 2013-08-19 MED ORDER — ZOLPIDEM TARTRATE 5 MG PO TABS: 5.0000 mg | ORAL_TABLET | Freq: Every evening | ORAL | Status: DC | PRN

## 2013-08-19 MED ORDER — HEPARIN SODIUM (PORCINE) 5000 UNIT/ML IJ SOLN
5000.0000 [IU] | Freq: Three times a day (TID) | INTRAMUSCULAR | Status: DC
  Administered 2013-08-19 – 2013-08-21 (×5): 5000 [IU] via SUBCUTANEOUS
  Filled 2013-08-19 (×8): qty 1

## 2013-08-19 MED ORDER — MIDAZOLAM HCL 5 MG/5ML IJ SOLN
INTRAMUSCULAR | Status: DC | PRN
  Administered 2013-08-19: 2 mg via INTRAVENOUS

## 2013-08-19 MED ORDER — LACTATED RINGERS IV SOLN: INTRAVENOUS | Status: DC

## 2013-08-19 MED ORDER — DOCUSATE SODIUM 100 MG PO CAPS
100.0000 mg | ORAL_CAPSULE | Freq: Two times a day (BID) | ORAL | Status: DC
  Administered 2013-08-19 – 2013-08-21 (×4): 100 mg via ORAL
  Filled 2013-08-19 (×5): qty 1

## 2013-08-19 MED ORDER — SODIUM CHLORIDE 0.9 % IJ SOLN
INTRAMUSCULAR | Status: AC
  Filled 2013-08-19: qty 30

## 2013-08-19 MED ORDER — LIDOCAINE HCL (CARDIAC) 20 MG/ML IV SOLN
INTRAVENOUS | Status: AC
  Filled 2013-08-19: qty 5

## 2013-08-19 MED ORDER — FENTANYL CITRATE 0.05 MG/ML IJ SOLN
INTRAMUSCULAR | Status: AC
  Filled 2013-08-19: qty 5

## 2013-08-19 MED ORDER — STERILE WATER FOR IRRIGATION IR SOLN
Status: DC | PRN
  Administered 2013-08-19: 3000 mL

## 2013-08-19 MED ORDER — HYDROMORPHONE HCL PF 1 MG/ML IJ SOLN
INTRAMUSCULAR | Status: DC | PRN
  Administered 2013-08-19 (×6): 0.5 mg via INTRAVENOUS
  Administered 2013-08-19: 1 mg via INTRAVENOUS

## 2013-08-19 MED ORDER — NEOSTIGMINE METHYLSULFATE 1 MG/ML IJ SOLN
INTRAMUSCULAR | Status: AC
  Filled 2013-08-19: qty 10

## 2013-08-19 MED ORDER — BUPIVACAINE-EPINEPHRINE PF 0.25-1:200000 % IJ SOLN
INTRAMUSCULAR | Status: AC
  Filled 2013-08-19: qty 30

## 2013-08-19 MED ORDER — FENTANYL CITRATE 0.05 MG/ML IJ SOLN
INTRAMUSCULAR | Status: DC | PRN
  Administered 2013-08-19: 50 ug via INTRAVENOUS
  Administered 2013-08-19 (×2): 100 ug via INTRAVENOUS

## 2013-08-19 MED ORDER — ONDANSETRON HCL 4 MG/2ML IJ SOLN
4.0000 mg | INTRAMUSCULAR | Status: DC | PRN
  Administered 2013-08-19 – 2013-08-20 (×3): 4 mg via INTRAVENOUS
  Filled 2013-08-19 (×3): qty 2

## 2013-08-19 MED ORDER — LACTATED RINGERS IV SOLN
INTRAVENOUS | Status: DC
  Administered 2013-08-19: 1000 mL via INTRAVENOUS
  Administered 2013-08-19: 15:00:00 via INTRAVENOUS

## 2013-08-19 MED ORDER — ONDANSETRON HCL 4 MG/2ML IJ SOLN
INTRAMUSCULAR | Status: AC
  Filled 2013-08-19: qty 2

## 2013-08-19 MED ORDER — ONDANSETRON HCL 4 MG/2ML IJ SOLN
INTRAMUSCULAR | Status: DC | PRN
  Administered 2013-08-19: 4 mg via INTRAVENOUS

## 2013-08-19 MED ORDER — CLINDAMYCIN PHOSPHATE 600 MG/50ML IV SOLN
600.0000 mg | Freq: Once | INTRAVENOUS | Status: AC
  Administered 2013-08-19: 600 mg via INTRAVENOUS
  Filled 2013-08-19: qty 50

## 2013-08-19 MED ORDER — ROPIVACAINE HCL 5 MG/ML IJ SOLN
60.0000 mL | Freq: Once | INTRAMUSCULAR | Status: AC
  Administered 2013-08-19: 22.5 mL
  Filled 2013-08-19: qty 60

## 2013-08-19 MED ORDER — ACETAMINOPHEN 500 MG PO TABS
1000.0000 mg | ORAL_TABLET | Freq: Four times a day (QID) | ORAL | Status: AC
  Administered 2013-08-19 – 2013-08-20 (×4): 1000 mg via ORAL
  Filled 2013-08-19 (×5): qty 2

## 2013-08-19 MED ORDER — ROCURONIUM BROMIDE 100 MG/10ML IV SOLN
INTRAVENOUS | Status: DC | PRN
  Administered 2013-08-19: 20 mg via INTRAVENOUS
  Administered 2013-08-19: 10 mg via INTRAVENOUS
  Administered 2013-08-19: 50 mg via INTRAVENOUS
  Administered 2013-08-19 (×3): 10 mg via INTRAVENOUS

## 2013-08-19 MED ORDER — GLYCOPYRROLATE 0.2 MG/ML IJ SOLN
INTRAMUSCULAR | Status: DC | PRN
  Administered 2013-08-19: .8 mg via INTRAVENOUS

## 2013-08-19 MED ORDER — PROPOFOL 10 MG/ML IV BOLUS
INTRAVENOUS | Status: DC | PRN
  Administered 2013-08-19: 200 mg via INTRAVENOUS

## 2013-08-19 MED ORDER — MIDAZOLAM HCL 2 MG/2ML IJ SOLN
INTRAMUSCULAR | Status: AC
  Filled 2013-08-19: qty 2

## 2013-08-19 MED ORDER — KCL IN DEXTROSE-NACL 20-5-0.45 MEQ/L-%-% IV SOLN
INTRAVENOUS | Status: DC
  Administered 2013-08-19: 20:00:00 via INTRAVENOUS
  Administered 2013-08-20: 100 mL/h via INTRAVENOUS
  Administered 2013-08-20 (×2): via INTRAVENOUS
  Filled 2013-08-19 (×5): qty 1000

## 2013-08-19 SURGICAL SUPPLY — 66 items
ADH SKN CLS APL DERMABOND .7 (GAUZE/BANDAGES/DRESSINGS)
APL ESCP 34 STRL LF DISP (HEMOSTASIS) ×1
APPLICATOR SURGIFLO ENDO (HEMOSTASIS) ×1 IMPLANT
BAG SPEC RTRVL LRG 6X4 10 (ENDOMECHANICALS) ×1
CANNULA SEAL DVNC (CANNULA) ×3 IMPLANT
CANNULA SEALS DA VINCI (CANNULA) ×3
CHLORAPREP W/TINT 26ML (MISCELLANEOUS) ×2 IMPLANT
CLIP LIGATING HEM O LOK PURPLE (MISCELLANEOUS) ×1 IMPLANT
CLIP LIGATING HEMO LOK XL GOLD (MISCELLANEOUS) ×4 IMPLANT
CLIP LIGATING HEMO O LOK GREEN (MISCELLANEOUS) ×2 IMPLANT
CORDS BIPOLAR (ELECTRODE) ×2 IMPLANT
COVER SURGICAL LIGHT HANDLE (MISCELLANEOUS) ×2 IMPLANT
COVER TIP SHEARS 8 DVNC (MISCELLANEOUS) ×1 IMPLANT
COVER TIP SHEARS 8MM DA VINCI (MISCELLANEOUS) ×1
DECANTER SPIKE VIAL GLASS SM (MISCELLANEOUS) ×1 IMPLANT
DERMABOND ADVANCED (GAUZE/BANDAGES/DRESSINGS)
DERMABOND ADVANCED .7 DNX12 (GAUZE/BANDAGES/DRESSINGS) ×2 IMPLANT
DRAIN CHANNEL 15F RND FF 3/16 (WOUND CARE) ×2 IMPLANT
DRAPE INCISE IOBAN 66X45 STRL (DRAPES) ×2 IMPLANT
DRAPE LAPAROSCOPIC ABDOMINAL (DRAPES) ×2 IMPLANT
DRAPE LG THREE QUARTER DISP (DRAPES) ×4 IMPLANT
DRAPE TABLE BACK 44X90 PK DISP (DRAPES) ×2 IMPLANT
DRAPE WARM FLUID 44X44 (DRAPE) ×2 IMPLANT
ELECT REM PT RETURN 9FT ADLT (ELECTROSURGICAL) ×2
ELECTRODE REM PT RTRN 9FT ADLT (ELECTROSURGICAL) ×2 IMPLANT
EVACUATOR SILICONE 100CC (DRAIN) ×2 IMPLANT
GLOVE BIOGEL M STRL SZ7.5 (GLOVE) ×4 IMPLANT
GOWN STRL REUS W/TWL LRG LVL3 (GOWN DISPOSABLE) ×4 IMPLANT
GOWN STRL REUS W/TWL XL LVL3 (GOWN DISPOSABLE) ×2 IMPLANT
KIT ACCESSORY DA VINCI DISP (KITS) ×1
KIT ACCESSORY DVNC DISP (KITS) ×1 IMPLANT
KIT BASIN OR (CUSTOM PROCEDURE TRAY) ×2 IMPLANT
LOOP VESSEL MAXI BLUE (MISCELLANEOUS) ×1 IMPLANT
NDL INSUFFLATION 14GA 120MM (NEEDLE) ×1 IMPLANT
NDL SPNL 22GX3.5 QUINCKE BK (NEEDLE) IMPLANT
NEEDLE INSUFFLATION 14GA 120MM (NEEDLE) ×2 IMPLANT
NEEDLE SPNL 22GX3.5 QUINCKE BK (NEEDLE) ×2 IMPLANT
PENCIL BUTTON HOLSTER BLD 10FT (ELECTRODE) ×2 IMPLANT
POSITIONER SURGICAL ARM (MISCELLANEOUS) ×3 IMPLANT
POUCH ENDO CATCH II 15MM (MISCELLANEOUS) ×1 IMPLANT
POUCH SPECIMEN RETRIEVAL 10MM (ENDOMECHANICALS) ×1 IMPLANT
RELOAD WHITE ECR60W (STAPLE) ×4 IMPLANT
SET TUBE IRRIG SUCTION NO TIP (IRRIGATION / IRRIGATOR) ×1 IMPLANT
SOLUTION ANTI FOG 6CC (MISCELLANEOUS) ×2 IMPLANT
SOLUTION ELECTROLUBE (MISCELLANEOUS) ×2 IMPLANT
SPONGE DRAIN TRACH 4X4 STRL 2S (GAUZE/BANDAGES/DRESSINGS) ×1 IMPLANT
SPONGE LAP 18X18 X RAY DECT (DISPOSABLE) ×1 IMPLANT
SPONGE LAP 4X18 X RAY DECT (DISPOSABLE) ×1 IMPLANT
STAPLE ECHEON FLEX 60 POW ENDO (STAPLE) ×1 IMPLANT
SURGIFLO W/THROMBIN 8M KIT (HEMOSTASIS) ×1 IMPLANT
SUT ETHILON 3 0 PS 1 (SUTURE) ×2 IMPLANT
SUT MNCRL AB 4-0 PS2 18 (SUTURE) ×4 IMPLANT
SUT PDS AB 1 TP1 54 (SUTURE) ×2 IMPLANT
SUT SILK 3 0 SH 30 (SUTURE) ×1 IMPLANT
SUT VICRYL 0 UR6 27IN ABS (SUTURE) ×3 IMPLANT
SUT VLOC BARB 180 ABS3/0GR12 (SUTURE) ×4
SUTURE VLOC BRB 180 ABS3/0GR12 (SUTURE) IMPLANT
SYR 50ML LL SCALE MARK (SYRINGE) ×1 IMPLANT
SYR BULB IRRIGATION 50ML (SYRINGE) IMPLANT
TOWEL OR NON WOVEN STRL DISP B (DISPOSABLE) ×3 IMPLANT
TRAY FOLEY CATH 14FRSI W/METER (CATHETERS) ×1 IMPLANT
TRAY FOLEY METER SIL LF 16FR (CATHETERS) ×1 IMPLANT
TRAY LAP CHOLE (CUSTOM PROCEDURE TRAY) ×2 IMPLANT
TROCAR XCEL 12X100 BLDLESS (ENDOMECHANICALS) ×2 IMPLANT
TUBING INSUFFLATION 10FT LAP (TUBING) ×1 IMPLANT
WATER STERILE IRR 1500ML POUR (IV SOLUTION) ×2 IMPLANT

## 2013-08-19 NOTE — H&P (Signed)
Reason For Visit Right renal mass   Active Problems Problems  1. Human immunodeficiency virus (HIV) infection (V08) 2. Renal cyst, left (753.10) 3. Renal neoplasm (239.5)   Right, 2 cm  History of Present Illness This 43 year old patient referred by Dr. Kathie Rhodes, MD for discussion of robotic-assisted laparoscopic partial nephrectomy. This was discovered as part of a right upper quadrant ultrasound for persistent nausea and vomiting and abdominal pain. An MRI was obtained subsequently which shows some enhancement of the lesion on the right renal mass. The patient was subsequently referred to Dr. Karsten Ro who discussed this situation with the patient in detail and have given him all the management options. Based on the patient's age and the characteristics of the renal mass he recommended the patient undergo surgical extirpation.  The patient has a past medical history significant for HIV. He tells me that at his last evaluation the virus was undetectable. He is followed closely by infectious disease in [place]. He is otherwise a healthy patient. No history of prior abdominal surgery.   Past Medical History Problems  1. History of depression (V11.8) 2. History of hypertension (V12.59)  Surgical History Problems  1. History of No Surgical Problems  Current Meds 1. AmLODIPine Besylate 5 MG Oral Tablet;  Therapy: (Recorded:18Dec2014) to Recorded 2. Atripla 600-200-300 MG Oral Tablet;  Therapy: (Recorded:18Dec2014) to Recorded  Allergies Medication  1. Ibuprofen TABS  Family History Problems  1. Family history of hypertension (V17.49) : Mother  Social History Problems  1. Caffeine use (V49.89) 2. Former smoker (V15.82)   1/2ppdx15 years  Review of Systems No flank pain, hematuria, dysuria, nausea, vomiting, fevers, chills, constipation or diarrhea.   Vitals Vital Signs [Data Includes: Last 1 Day]  Recorded: [id number] 03:06PM  Height: 5 ft 6 in Weight: 225 lb  BMI  Calculated: 36.32 BSA Calculated: 2.1 Blood Pressure: 138 / 97 Temperature: 98 F Heart Rate: 97  Physical Exam Constitutional: Well nourished and well developed . No acute distress.  ENT:. The ears and nose are normal in appearance.  Neck: The appearance of the neck is normal and no neck mass is present.  Pulmonary: No respiratory distress and normal respiratory rhythm and effort.  Cardiovascular: Heart rate and rhythm are normal . No peripheral edema.  Abdomen: The abdomen is soft and nontender. No masses are palpated. No CVA tenderness. No hernias are palpable. No hepatosplenomegaly noted.  Rectal: Rectal exam demonstrates normal sphincter tone, no tenderness and no masses. The prostate has no nodularity and is not tender. The left seminal vesicle is nonpalpable. The right seminal vesicle is nonpalpable. The perineum is normal on inspection.  Genitourinary: Examination of the penis demonstrates no discharge, no masses, no lesions and a normal meatus. The scrotum is without lesions. The right epididymis is palpably normal and non-tender. The left epididymis is palpably normal and non-tender. The right testis is non-tender and without masses. The left testis is non-tender and without masses.  Lymphatics: The femoral and inguinal nodes are not enlarged or tender.  Skin: Normal skin turgor, no visible rash and no visible skin lesions.  Neuro/Psych:. Mood and affect are appropriate.    Results/Data  The following images/tracing/specimen were independently visualized:  MRI. The right kidney appears to have one artery and two renal veins.  IMPRESSION:  1. Small enhancing lesion extending from the lateral aspect of the  right mid kidney is concerning for a small renal neoplasm. This has  increased in size gradually from 2009. Recommend Urology  consultation.  2. Bosniak I renal cyst of the left kidney.      Assessment Assessed  1. Human immunodeficiency virus (HIV) infection  (V08)  #1 small right renal mass  #2 HIV, currently undetectable.   Plan Renal neoplasm  1. Follow-up Schedule Surgery Office  Follow-up  Status: Complete  Done: [id number]  Discussion/Summary The patient has been given the natural history of renal cancer, treatment options, and recommended surgical extirpation for this patient. I went over the robotic-assisted laparoscopic partial nephrectomy approach. I described for the patient the procedure in detail including port placement. I detailed the postoperative course including the fact that the patient would have both a drain and a Foley catheter following the surgery. I told him that most often patients are discharged on postoperative day one or 2. I then detailed the expected recovery time, I told him that he would not be able to lift anything greater than 20 pounds for 4 weeks. I also went over the risks and benefits of this operation in great detail. We discussed the risk of injury to surrounding structures, major blood vessels and nerves, bleeding, infection, loss of kidney, and the risk of recurrent cancer. I will plan to schedule this with one of my partners, most likely Dr. Tresa Moore. At the end of the consultation on questions were answered from the patient and his accompanying friend.

## 2013-08-19 NOTE — Anesthesia Preprocedure Evaluation (Signed)
Anesthesia Evaluation  Patient identified by MRN, date of birth, ID band Patient awake    Reviewed: Allergy & Precautions, H&P , NPO status , Patient's Chart, lab work & pertinent test results  Airway Mallampati: II TM Distance: >3 FB Neck ROM: full    Dental no notable dental hx. (+) Teeth Intact and Dental Advisory Given   Pulmonary neg pulmonary ROS, former smoker,  breath sounds clear to auscultation  Pulmonary exam normal       Cardiovascular Exercise Tolerance: Good hypertension, Pt. on medications Rhythm:regular Rate:Normal     Neuro/Psych Depression negative neurological ROS  negative psych ROS   GI/Hepatic negative GI ROS, Neg liver ROS,   Endo/Other  negative endocrine ROS  Renal/GU negative Renal ROS  negative genitourinary   Musculoskeletal   Abdominal   Peds  Hematology  (+) HIV,   Anesthesia Other Findings   Reproductive/Obstetrics negative OB ROS                           Anesthesia Physical Anesthesia Plan  ASA: III  Anesthesia Plan: General   Post-op Pain Management:    Induction: Intravenous  Airway Management Planned: Oral ETT  Additional Equipment:   Intra-op Plan:   Post-operative Plan: Extubation in OR  Informed Consent: I have reviewed the patients History and Physical, chart, labs and discussed the procedure including the risks, benefits and alternatives for the proposed anesthesia with the patient or authorized representative who has indicated his/her understanding and acceptance.   Dental Advisory Given  Plan Discussed with: CRNA and Surgeon  Anesthesia Plan Comments:         Anesthesia Quick Evaluation

## 2013-08-19 NOTE — Op Note (Addendum)
Preoperative diagnosis:  1. Right renal mass  Postoperative diagnosis:  1. As above   Procedure: 1. Robotic assisted laparoscopic right partial nephrectomy 2. TAPs block, transversus abdominis plain injection of ropivacaine  Surgeon: Ardis Hughs, MD First assistant: Dr. Phebe Colla, M.D.  Anesthesia: General  Complications: None  Intraoperative findings: Small exophytic mass located on the anterior lateral aspect of the right kidney, margins grossly negative.  EBL: Minimal  Specimens: None  Indication: Drew Cuevas is a 43 y.o. patient with incidental finding of right renal mass during her workup for abdominal pain. An MRI was with obtained confirming enhancement of the mass which is concerning for renal cell carcinoma.  After reviewing the management options for treatment, he elected to proceed with the above surgical procedure(s). We have discussed the potential benefits and risks of the procedure, side effects of the proposed treatment, the likelihood of the patient achieving the goals of the procedure, and any potential problems that might occur during the procedure or recuperation. Informed consent has been obtained.  Description of procedure: The patient was taken back to the operating room placed on the table in supine position. General anesthesia was then induced and endotracheal tube inserted. A Foley catheter was then inserted as well. Patient was then placed in the left lateral decubitus position, was then secured to the table with a beanbag, axillary roll in the lower extremities were built up with pillows all bony prominences were padded the patient was taped at the chest, hips and knees. The table was then flexed so as to open up the posterior vertebral angle.  A Veress needle was then inserted through the right abdominal wall and the abdomen inflated to 15 mmHg. 12 mm port was then placed just lateral to the umbilicus and the robotic camera, 30 down, was passed  through the port. We then placed the two 8 mm robotic ports under visual guidance. The first port is located just below the costal margin lateral to the xiphoid process and a second port placed in the right lower quadrant so as to triangulate onto the renal hilum. A 5 mm port was placed at the xiphoid process and used a liver retractor. An assistant port was placed below the camera port just lateral to midline. A third robotic port was then placed along the anterior axillary line between iliac crest and the 12th rib. The robot was then docke, in the left hand a bipolar fenestrated grasper was placed and in the right hand monopolar scissors were placed. The third robotic port was left undocked and used as an Asst. Port.  We then started dissection by mobilizing the ascending colon medially down into the pelvis. The duodenum was also reflected medially. The ureter was identified at the lower pole of the right kidney. Further dissection of the ureter from the underlying psoas allowed the ureter to be lifted up through the third robotic port which allowed lift of the kidney and facilitated dissection of the hilum. We then turned our attention to the renal hilum and dissected out the main renal vein as well as an accessory renal vein which appeared to be directly behind the main renal vein. A vesi-loop was then passed around both of these vessels and Weck clip placed on the vessel loop. This was then used to retract the vein inferiorly which helped facilitate the dissection of the renal artery. The renal artery was then dissected freely and I was able to get a vessel loop around the base of the  renal artery as well.  Once the hilum had been identified and secured attention was turned to the anterior aspect of the kidney.  Gerotas fat was dissected off the anterior aspect of the kidney laterally to the renal mass. The renal mass was readily identified. The fat surrounding the mass was left on top of the mass and the  remaining fat was dissected off of the surface. Once the fat had been dissected off the surface of the kidney around the lesion, the lesion was marked 360 around with cautery and used to delineate the margin around the mass. 12.5 mg of mannitol was administered. 2 bulldog clamps were then placed across the renal artery and the dissection ensued of the renal mass. This was done using the scissors sharply without cautery. Once the mass had been completely excised it was placed aside and the defect was closed in 2 layers. The first layer was closed with a 2-0 V-loc suture in a running fashion within the base of the resection, closing the collecting system and vasculature.  The ends of the V-loc were secured to either end of the defect with a Weck clip. The second layer was closed with a 2-0 V-loc suture in an interrupted fashion reapproximating the edges and creating tension by cinching the sutures down with Weck clips. Total clamp time was 12 minutes.  Once the second layer of suture had been completed the bulldogs released the renal artery there was no secondary or ongoing bleeding. An additional 12.5mg  of mannitol was given once the clamps were removed. FloSeal was then squirted into the region of the resected mass and the remaining gerota's fat was then stretched over the defect and secured with Weck clips. The mass was then placed in an Endo Catch bag. The string from the Endo Catch bag was then pulled through the assistant port. The Vesseloops were then removed from the patient. A drain was then passed through the third port and positioned around the resected area.  Then using a spinal needle we injected the patient along the anterior axillary line in the plane in between the transversus abdominis and the internal oblique muscles in the region of the musculocutaneous nervesand with a total of 45 cc of 2.5% ropivacaine in for separate injections. This was done under direct vision using the robotic  camera.  The camera port was then closed using a [employer] needle to close the fascia under visual guidance. The ports were then all removed. The specimen was removed through the assistant port without a significant need for dilating the fascia. Once the specimen was removed the fascia was then approximated with 0 Vicryl in 2 interrupted stitches. All skin incisions were then closed with a 4-0 Monocryl in a subcutaneous fashion. The drain was secured with a 3-0 nylon. Dermabond was then applied to the incisions. The patient was subsequently extubated and returned to the PACU in excellent condition. All laps, needles and sponges were accounted for at the end of case.  Ardis Hughs, M.D.

## 2013-08-19 NOTE — Transfer of Care (Signed)
Immediate Anesthesia Transfer of Care Note  Patient: Drew Cuevas  Procedure(s) Performed: Procedure(s) (LRB): ROBOTIC ASSISTED LAPAROSCOPIC RIGHT PARTIAL NEPHRECTOMY (Right)  Patient Location: PACU  Anesthesia Type: General  Level of Consciousness: sedated, patient cooperative and responds to stimulation  Airway & Oxygen Therapy: Patient Spontanous Breathing and Patient connected to face mask oxgen  Post-op Assessment: Report given to PACU RN and Post -op Vital signs reviewed and stable  Post vital signs: Reviewed and stable  Complications: No apparent anesthesia complications

## 2013-08-19 NOTE — Interval H&P Note (Signed)
History and Physical Interval Note: No changes to above.  Patient has been well in the interval.  08/19/2013 12:11 PM  Drew Cuevas  has presented today for surgery, with the diagnosis of right renal mass  The various methods of treatment have been discussed with the patient and family. After consideration of risks, benefits and other options for treatment, the patient has consented to  Procedure(s): ROBOTIC ASSISTED LAPAROSCOPIC RIGHT PARTIAL NEPHRECTOMY (Right) as a surgical intervention .  The patient's history has been reviewed, patient examined, no change in status, stable for surgery.  I have reviewed the patient's chart and labs.  Questions were answered to the patient's satisfaction.     Louis Meckel W

## 2013-08-19 NOTE — Anesthesia Postprocedure Evaluation (Signed)
  Anesthesia Post-op Note  Patient: Drew Cuevas  Procedure(s) Performed: Procedure(s) (LRB): ROBOTIC ASSISTED LAPAROSCOPIC RIGHT PARTIAL NEPHRECTOMY (Right)  Patient Location: PACU  Anesthesia Type: General  Level of Consciousness: awake and alert   Airway and Oxygen Therapy: Patient Spontanous Breathing  Post-op Pain: mild  Post-op Assessment: Post-op Vital signs reviewed, Patient's Cardiovascular Status Stable, Respiratory Function Stable, Patent Airway and No signs of Nausea or vomiting  Last Vitals:  Filed Vitals:   08/19/13 2055  BP: 130/86  Pulse: 80  Temp: 36.5 C  Resp: 16    Post-op Vital Signs: stable   Complications: No apparent anesthesia complications

## 2013-08-20 LAB — BASIC METABOLIC PANEL
BUN: 6 mg/dL (ref 6–23)
CHLORIDE: 103 meq/L (ref 96–112)
CO2: 23 mEq/L (ref 19–32)
CREATININE: 1.28 mg/dL (ref 0.50–1.35)
Calcium: 8.1 mg/dL — ABNORMAL LOW (ref 8.4–10.5)
GFR calc Af Amer: 79 mL/min — ABNORMAL LOW (ref >90)
GFR calc non Af Amer: 68 mL/min — ABNORMAL LOW (ref >90)
Glucose, Bld: 105 mg/dL — ABNORMAL HIGH (ref 70–99)
Potassium: 4.5 mEq/L (ref 3.7–5.3)
Sodium: 136 mEq/L — ABNORMAL LOW (ref 137–147)

## 2013-08-20 LAB — CBC
HEMATOCRIT: 40.5 % (ref 39.0–52.0)
Hemoglobin: 13.4 g/dL (ref 13.0–17.0)
MCH: 31.9 pg (ref 26.0–34.0)
MCHC: 33.1 g/dL (ref 30.0–36.0)
MCV: 96.4 fL (ref 78.0–100.0)
PLATELETS: 200 10*3/uL (ref 150–400)
RBC: 4.2 MIL/uL — AB (ref 4.22–5.81)
RDW: 13.3 % (ref 11.5–15.5)
WBC: 9.1 10*3/uL (ref 4.0–10.5)

## 2013-08-20 NOTE — Progress Notes (Signed)
Urology Inpatient Progress Report  S/p right robotic assisted laparoscopic partial nephrectomy  Intv/Subj: No acute events overnight. Patient is without complaint. Abdomen is sore. Has walked twice.  Objective: Vital: Filed Vitals:   08/19/13 1736 08/19/13 2055 08/20/13 0209 08/20/13 0624  BP: 130/85 130/86 122/84 129/92  Pulse: 86 80 79 89  Temp: 97.7 F (36.5 C) 97.7 F (36.5 C) 98 F (36.7 C) 97.9 F (36.6 C)  TempSrc: Oral Oral Oral Oral  Resp:  16 16 20   Height: 5\' 6"  (1.676 m)     Weight: 105.688 kg (233 lb)     SpO2: 96% 100% 97% 98%   I/Os: I/O last 3 completed shifts: In: 2100 [I.V.:2100] Out: 1830 [Urine:1750; Drains:30; Blood:50]  Past Medical History  Diagnosis Date  . Hypertension   . HIV (human immunodeficiency virus infection)     UNDER CONTROL WITH MEDICATIONS  . Immune deficiency disorder   . Bronchitis     LAST FLARE UP WAS NEW YEARS 2015  . Renal mass, right   . GERD (gastroesophageal reflux disease)     NO MEDS   Current Facility-Administered Medications  Medication Dose Route Frequency Provider Last Rate Last Dose  . acetaminophen (TYLENOL) tablet 1,000 mg  1,000 mg Oral Q6H Ardis Hughs, MD   1,000 mg at 08/20/13 7253  . dextrose 5 % and 0.45 % NaCl with KCl 20 mEq/L infusion   Intravenous Continuous Ardis Hughs, MD 100 mL/hr at 08/20/13 0439    . docusate sodium (COLACE) capsule 100 mg  100 mg Oral BID Ardis Hughs, MD   100 mg at 08/19/13 2142  . heparin injection 5,000 Units  5,000 Units Subcutaneous Q8H Ardis Hughs, MD   5,000 Units at 08/20/13 [phone]  . HYDROmorphone (DILAUDID) injection 0.5-1 mg  0.5-1 mg Intravenous Q2H PRN Ardis Hughs, MD   1 mg at 08/20/13 0436  . ondansetron (ZOFRAN) injection 4 mg  4 mg Intravenous Q4H PRN Ardis Hughs, MD   4 mg at 08/19/13 2204  . oxyCODONE (Oxy IR/ROXICODONE) immediate release tablet 5-15 mg  5-15 mg Oral Q4H PRN Ardis Hughs, MD   15 mg at 08/19/13 1935   . zolpidem (AMBIEN) tablet 5 mg  5 mg Oral QHS PRN Ardis Hughs, MD        Physical Exam:  General: Patient is in no apparent distress Lungs: Normal respiratory effort, chest expands symmetrically. GI: The abdomen is soft and nontender without mass. Ext: lower extremities symmetric  Lab Results:  Recent Labs  08/19/13 1907 08/20/13 0405  WBC 11.7* 9.1  HGB 13.9 13.4  HCT 39.8 40.5    Recent Labs  08/19/13 1629 08/20/13 0405  NA 138 136*  K 4.4 4.5  CL 105 103  CO2 23 23  GLUCOSE 110* 105*  BUN 6 6  CREATININE 1.23 1.28  CALCIUM 8.3* 8.1*   No results found for this basename: LABPT, INR,  in the last 72 hours No results found for this basename: LABURIN,  in the last 72 hours Results for orders placed during the hospital encounter of 05/28/12  RAPID STREP SCREEN     Status: Abnormal   Collection Time    05/28/12 10:45 AM      Result Value Range Status   Streptococcus, Group A Screen (Direct) POSITIVE (*) NEGATIVE Final    Studies/Results:   Assessment: 1 Day Post-Op status post right robotic-assisted partial nephrectomy doing well  Plan: DC Foley cath Advance  diet as tolerated Pain medications when necessary, encourage patient to stay on top of his pain rather than waiting for pain to ask your medicine Patient will remain in the hospital one extra day so that I can trend his hemoglobin as well as his creatinine, we'll plan to leave his JP drain in until tomorrow morning Discharge tomorrow.   Ardis Hughs 08/20/2013, 8:35 AM

## 2013-08-20 NOTE — Progress Notes (Signed)
UR completed 

## 2013-08-21 ENCOUNTER — Encounter (HOSPITAL_COMMUNITY): Payer: Self-pay | Admitting: Urology

## 2013-08-21 LAB — BASIC METABOLIC PANEL
BUN: 7 mg/dL (ref 6–23)
CHLORIDE: 100 meq/L (ref 96–112)
CO2: 21 mEq/L (ref 19–32)
Calcium: 8.3 mg/dL — ABNORMAL LOW (ref 8.4–10.5)
Creatinine, Ser: 1.31 mg/dL (ref 0.50–1.35)
GFR calc non Af Amer: 66 mL/min — ABNORMAL LOW (ref >90)
GFR, EST AFRICAN AMERICAN: 77 mL/min — AB (ref >90)
Glucose, Bld: 124 mg/dL — ABNORMAL HIGH (ref 70–99)
POTASSIUM: 4.4 meq/L (ref 3.7–5.3)
Sodium: 132 mEq/L — ABNORMAL LOW (ref 137–147)

## 2013-08-21 LAB — CREATININE, FLUID (PLEURAL, PERITONEAL, JP DRAINAGE): Creat, Fluid: 1.3 mg/dL

## 2013-08-21 LAB — HEMOGLOBIN AND HEMATOCRIT, BLOOD
HCT: 39.9 % (ref 39.0–52.0)
HEMOGLOBIN: 13.8 g/dL (ref 13.0–17.0)

## 2013-08-21 MED ORDER — DSS 100 MG PO CAPS: 100.0000 mg | ORAL_CAPSULE | Freq: Two times a day (BID) | ORAL | Status: DC

## 2013-08-21 MED ORDER — OXYCODONE HCL 5 MG PO TABS: 5.0000 mg | ORAL_TABLET | ORAL | Status: DC | PRN

## 2013-08-21 NOTE — Discharge Instructions (Signed)

## 2013-08-21 NOTE — Progress Notes (Signed)
Discharge to home, alert and oriented, no complaints of any pain or discomfort. Discharge instructions and follow up appointments done and was given to the patient. PIV removed  No s/s  Of infiltration or swelling  On insertion site.

## 2013-08-21 NOTE — Discharge Summary (Signed)
Date of admission: 08/19/2013  Date of discharge: 08/21/2013  Admission diagnosis: right renal mass  Discharge diagnosis: as above, HIV  Secondary diagnoses:  Patient Active Problem List   Diagnosis Date Noted  . Renal mass, right 08/19/2013  . HIV disease 05/21/2013  . Renal cyst 05/21/2013  . Obesity (BMI 35.0-39.9 without comorbidity) 05/26/2012  . Diarrhea 11/07/2011  . Right knee pain 04/30/2011  . Recurrent boils 06/10/2009  . INTERNAL HEMORRHOIDS WITHOUT MENTION COMP 12/01/2008  . Essential hypertension, benign 04/28/2008  . DEPRESSION, ACUTE 12/20/2006  . HIV DISEASE 08/28/2006    History and Physical: For full details, please see admission history and physical. Briefly, Damontre Millea is a 43 y.o. year old patient with incidental finding of right renal mass.  Presented to the hospital for robotic assisted partial nephrectomy   Hospital Course: Patient tolerated the procedure well.  He was then transferred to the floor after an uneventful PACU stay.  His hospital course was uncomplicated.  On POD#2  he had met discharge criteria: was eating a regular diet, was up and ambulating independently,  pain was well controlled, was voiding without a catheter, and was ready to for discharge.   Laboratory values:   Recent Labs  08/19/13 1907 08/20/13 0405 08/21/13 0522  WBC 11.7* 9.1  --   HGB 13.9 13.4 13.8  HCT 39.8 40.5 39.9    Recent Labs  08/19/13 1629 08/20/13 0405 08/21/13 0522  NA 138 136* 132*  K 4.4 4.5 4.4  CL 105 103 100  CO2 '23 23 21  ' GLUCOSE 110* 105* 124*  BUN '6 6 7  ' CREATININE 1.23 1.28 1.31  CALCIUM 8.3* 8.1* 8.3*   No results found for this basename: LABPT, INR,  in the last 72 hours No results found for this basename: LABURIN,  in the last 72 hours Results for orders placed during the hospital encounter of 05/28/12  RAPID STREP SCREEN     Status: Abnormal   Collection Time    05/28/12 10:45 AM      Result Value Range Status   Streptococcus,  Group A Screen (Direct) POSITIVE (*) NEGATIVE Final    Disposition: Home  Discharge instruction: The patient was instructed to be ambulatory but told to refrain from heavy lifting, strenuous activity, or driving.   Discharge medications:    Medication List         amLODipine 5 MG tablet  Commonly known as:  NORVASC  Take 5 mg by mouth at bedtime.     DSS 100 MG Caps  Take 100 mg by mouth 2 (two) times daily.     efavirenz-emtricitabine-tenofovir 600-200-300 MG per tablet  Commonly known as:  ATRIPLA  Take 1 tablet by mouth at bedtime.     oxyCODONE 5 MG immediate release tablet  Commonly known as:  Oxy IR/ROXICODONE  Take 1-3 tablets (5-15 mg total) by mouth every 3 (three) hours as needed for moderate pain.        Followup:      Follow-up Information   Follow up with Ardis Hughs, MD On 09/01/2013. (9:15am)    Specialty:  Urology   Contact information:   [facility]  [place] [address] [phone]

## 2013-08-21 NOTE — Progress Notes (Signed)
Urology Inpatient Progress Report  S/p right robotic assisted laparoscopic partial nephrectomy  Intv/Subj: Foley removed, voiding well C/o abdominal soreness  Walking No flatus  Objective: Vital: Filed Vitals:   08/20/13 0624 08/20/13 1431 08/20/13 2101 08/21/13 0528  BP: 129/92 147/94 133/95 135/84  Pulse: 89 96 106 98  Temp: 97.9 F (36.6 C) 98.2 F (36.8 C) 99.4 F (37.4 C) 98.3 F (36.8 C)  TempSrc: Oral Oral Oral Oral  Resp: 20 18 16 20   Height:      Weight:      SpO2: 98% 99% 98% 93%   I/Os: I/O last 3 completed shifts: In: 4070 [P.O.:720; I.V.:3350] Out: 6767 [Urine:3550; Drains:85]  Past Medical History  Diagnosis Date  . Hypertension   . HIV (human immunodeficiency virus infection)     UNDER CONTROL WITH MEDICATIONS  . Immune deficiency disorder   . Bronchitis     LAST FLARE UP WAS NEW YEARS 2015  . Renal mass, right   . GERD (gastroesophageal reflux disease)     NO MEDS   Current Facility-Administered Medications  Medication Dose Route Frequency Provider Last Rate Last Dose  . docusate sodium (COLACE) capsule 100 mg  100 mg Oral BID Ardis Hughs, MD   100 mg at 08/20/13 2234  . heparin injection 5,000 Units  5,000 Units Subcutaneous Q8H Ardis Hughs, MD   5,000 Units at 08/21/13 [phone]  . HYDROmorphone (DILAUDID) injection 0.5-1 mg  0.5-1 mg Intravenous Q2H PRN Ardis Hughs, MD   1 mg at 08/21/13 0323  . ondansetron (ZOFRAN) injection 4 mg  4 mg Intravenous Q4H PRN Ardis Hughs, MD   4 mg at 08/20/13 2234  . oxyCODONE (Oxy IR/ROXICODONE) immediate release tablet 5-15 mg  5-15 mg Oral Q4H PRN Ardis Hughs, MD   15 mg at 08/21/13 0529  . zolpidem (AMBIEN) tablet 5 mg  5 mg Oral QHS PRN Ardis Hughs, MD        Physical Exam:  General: Patient is in no apparent distress Lungs: Normal respiratory effort, chest expands symmetrically. GI: The abdomen is soft and nontender without mass. Ext: lower extremities  symmetric  Lab Results:  Recent Labs  08/19/13 1907 08/20/13 0405 08/21/13 0522  WBC 11.7* 9.1  --   HGB 13.9 13.4 13.8  HCT 39.8 40.5 39.9    Recent Labs  08/19/13 1629 08/20/13 0405 08/21/13 0522  NA 138 136* 132*  K 4.4 4.5 4.4  CL 105 103 100  CO2 23 23 21   GLUCOSE 110* 105* 124*  BUN 6 6 7   CREATININE 1.23 1.28 1.31  CALCIUM 8.3* 8.1* 8.3*   No results found for this basename: LABPT, INR,  in the last 72 hours No results found for this basename: LABURIN,  in the last 72 hours Results for orders placed during the hospital encounter of 05/28/12  RAPID STREP SCREEN     Status: Abnormal   Collection Time    05/28/12 10:45 AM      Result Value Range Status   Streptococcus, Group A Screen (Direct) POSITIVE (*) NEGATIVE Final    Studies/Results:   Assessment: 2 Days Post-Op status post right robotic-assisted partial nephrectomy doing well  Plan: JP creatinine - d/c drain if consistent with serum Plan to d/c by noon today F/u with me in 2 weeks.  Louis Meckel W 08/21/2013, 7:29 AM

## 2013-08-23 ENCOUNTER — Encounter: Payer: Self-pay | Admitting: Internal Medicine

## 2013-08-31 ENCOUNTER — Other Ambulatory Visit: Payer: Self-pay | Admitting: Licensed Clinical Social Worker

## 2013-08-31 DIAGNOSIS — I10 Essential (primary) hypertension: Secondary | ICD-10-CM

## 2013-08-31 DIAGNOSIS — B20 Human immunodeficiency virus [HIV] disease: Secondary | ICD-10-CM

## 2013-08-31 MED ORDER — AMLODIPINE BESYLATE 5 MG PO TABS: 5.0000 mg | ORAL_TABLET | Freq: Every day | ORAL | Status: DC

## 2013-08-31 MED ORDER — EFAVIRENZ-EMTRICITAB-TENOFOVIR 600-200-300 MG PO TABS
1.0000 | ORAL_TABLET | Freq: Every day | ORAL | Status: DC
Start: 2013-08-31 — End: 2014-01-28

## 2013-09-29 ENCOUNTER — Other Ambulatory Visit (INDEPENDENT_AMBULATORY_CARE_PROVIDER_SITE_OTHER): Payer: Self-pay

## 2013-09-29 DIAGNOSIS — B20 Human immunodeficiency virus [HIV] disease: Secondary | ICD-10-CM

## 2013-09-29 LAB — CBC
HCT: 41.7 % (ref 39.0–52.0)
HEMOGLOBIN: 14.6 g/dL (ref 13.0–17.0)
MCH: 32.7 pg (ref 26.0–34.0)
MCHC: 35 g/dL (ref 30.0–36.0)
MCV: 93.5 fL (ref 78.0–100.0)
PLATELETS: 176 10*3/uL (ref 150–400)
RBC: 4.46 MIL/uL (ref 4.22–5.81)
RDW: 14.3 % (ref 11.5–15.5)
WBC: 7.2 10*3/uL (ref 4.0–10.5)

## 2013-09-30 LAB — COMPREHENSIVE METABOLIC PANEL
ALK PHOS: 98 U/L (ref 39–117)
ALT: 23 U/L (ref 0–53)
AST: 23 U/L (ref 0–37)
Albumin: 4 g/dL (ref 3.5–5.2)
BUN: 11 mg/dL (ref 6–23)
CO2: 24 mEq/L (ref 19–32)
Calcium: 9.4 mg/dL (ref 8.4–10.5)
Chloride: 104 mEq/L (ref 96–112)
Creat: 1.46 mg/dL — ABNORMAL HIGH (ref 0.50–1.35)
GLUCOSE: 74 mg/dL (ref 70–99)
Potassium: 4.2 mEq/L (ref 3.5–5.3)
SODIUM: 140 meq/L (ref 135–145)
TOTAL PROTEIN: 7.3 g/dL (ref 6.0–8.3)
Total Bilirubin: 0.5 mg/dL (ref 0.2–1.2)

## 2013-09-30 LAB — T-HELPER CELL (CD4) - (RCID CLINIC ONLY)
CD4 % Helper T Cell: 27 % — ABNORMAL LOW (ref 33–55)
CD4 T Cell Abs: 860 /uL (ref 400–2700)

## 2013-10-01 LAB — HIV-1 RNA QUANT-NO REFLEX-BLD: HIV 1 RNA Quant: <20 copies/mL (ref <20)

## 2013-10-19 ENCOUNTER — Encounter (HOSPITAL_COMMUNITY): Payer: Self-pay | Admitting: Emergency Medicine

## 2013-10-19 ENCOUNTER — Ambulatory Visit: Payer: No Typology Code available for payment source | Admitting: Internal Medicine

## 2013-10-19 ENCOUNTER — Emergency Department (HOSPITAL_COMMUNITY)
Admission: EM | Admit: 2013-10-19 | Discharge: 2013-10-19 | Disposition: A | Discharge status: Home / Self Care | Payer: No Typology Code available for payment source | Attending: Emergency Medicine | Admitting: Emergency Medicine

## 2013-10-19 ENCOUNTER — Emergency Department (HOSPITAL_COMMUNITY): Payer: No Typology Code available for payment source

## 2013-10-19 DIAGNOSIS — K6289 Other specified diseases of anus and rectum: Secondary | ICD-10-CM | POA: Insufficient documentation

## 2013-10-19 DIAGNOSIS — Z87891 Personal history of nicotine dependence: Secondary | ICD-10-CM | POA: Insufficient documentation

## 2013-10-19 DIAGNOSIS — Z8709 Personal history of other diseases of the respiratory system: Secondary | ICD-10-CM | POA: Insufficient documentation

## 2013-10-19 DIAGNOSIS — Z79899 Other long term (current) drug therapy: Secondary | ICD-10-CM | POA: Insufficient documentation

## 2013-10-19 DIAGNOSIS — Z21 Asymptomatic human immunodeficiency virus [HIV] infection status: Secondary | ICD-10-CM | POA: Insufficient documentation

## 2013-10-19 DIAGNOSIS — Z87448 Personal history of other diseases of urinary system: Secondary | ICD-10-CM | POA: Insufficient documentation

## 2013-10-19 DIAGNOSIS — I10 Essential (primary) hypertension: Secondary | ICD-10-CM | POA: Insufficient documentation

## 2013-10-19 DIAGNOSIS — Z880 Allergy status to penicillin: Secondary | ICD-10-CM | POA: Insufficient documentation

## 2013-10-19 DIAGNOSIS — Z792 Long term (current) use of antibiotics: Secondary | ICD-10-CM | POA: Insufficient documentation

## 2013-10-19 DIAGNOSIS — Z91040 Latex allergy status: Secondary | ICD-10-CM | POA: Insufficient documentation

## 2013-10-19 LAB — BASIC METABOLIC PANEL
BUN: 10 mg/dL (ref 6–23)
CO2: 23 mEq/L (ref 19–32)
Calcium: 9.1 mg/dL (ref 8.4–10.5)
Chloride: 106 mEq/L (ref 96–112)
Creatinine, Ser: 1.31 mg/dL (ref 0.50–1.35)
GFR calc Af Amer: 77 mL/min — ABNORMAL LOW (ref >90)
GFR calc non Af Amer: 66 mL/min — ABNORMAL LOW (ref >90)
Glucose, Bld: 84 mg/dL (ref 70–99)
Potassium: 3.8 mEq/L (ref 3.7–5.3)
Sodium: 141 mEq/L (ref 137–147)

## 2013-10-19 LAB — CBC
HCT: 41.4 % (ref 39.0–52.0)
Hemoglobin: 14.4 g/dL (ref 13.0–17.0)
MCH: 33 pg (ref 26.0–34.0)
MCHC: 34.8 g/dL (ref 30.0–36.0)
MCV: 94.7 fL (ref 78.0–100.0)
Platelets: 240 10*3/uL (ref 150–400)
RBC: 4.37 MIL/uL (ref 4.22–5.81)
RDW: 13.7 % (ref 11.5–15.5)
WBC: 6.4 10*3/uL (ref 4.0–10.5)

## 2013-10-19 MED ORDER — OXYCODONE-ACETAMINOPHEN 5-325 MG PO TABS
2.0000 | ORAL_TABLET | Freq: Once | ORAL | Status: AC
  Administered 2013-10-19: 1 via ORAL
  Filled 2013-10-19: qty 2

## 2013-10-19 MED ORDER — METRONIDAZOLE 500 MG PO TABS: 500.0000 mg | ORAL_TABLET | Freq: Two times a day (BID) | ORAL | Status: DC

## 2013-10-19 MED ORDER — MORPHINE SULFATE 4 MG/ML IJ SOLN
4.0000 mg | Freq: Once | INTRAMUSCULAR | Status: AC
  Administered 2013-10-19: 4 mg via INTRAVENOUS
  Filled 2013-10-19: qty 1

## 2013-10-19 MED ORDER — CIPROFLOXACIN HCL 500 MG PO TABS: 500.0000 mg | ORAL_TABLET | Freq: Two times a day (BID) | ORAL | Status: DC

## 2013-10-19 MED ORDER — IOHEXOL 300 MG/ML  SOLN
100.0000 mL | Freq: Once | INTRAMUSCULAR | Status: AC | PRN
  Administered 2013-10-19: 100 mL via INTRAVENOUS

## 2013-10-19 MED ORDER — OXYCODONE-ACETAMINOPHEN 5-325 MG PO TABS: ORAL_TABLET | ORAL | Status: DC

## 2013-10-19 MED ORDER — IOHEXOL 300 MG/ML  SOLN
50.0000 mL | Freq: Once | INTRAMUSCULAR | Status: AC | PRN
  Administered 2013-10-19: 50 mL via ORAL

## 2013-10-19 NOTE — ED Notes (Addendum)
Pt states he has a pain in his rectum.  Pt states he feels like it is a boil.  He has had them in the past.  Pt states pain started 10 days ago.

## 2013-10-19 NOTE — Progress Notes (Signed)
[facility] spoke with patient about [employer] [facility]. Patient confirmed PCP was [facility] and [facility]. Patient denied CL offer to schedule a follow-up apt, stating he had an apt next week at [facility].

## 2013-10-19 NOTE — ED Provider Notes (Signed)
CSN: [id number]     Arrival date & time 10/19/13  1046 History   First MD Initiated Contact with Patient 10/19/13 1106     Chief Complaint  Patient presents with  . Rectal Pain     (Consider location/radiation/quality/duration/timing/severity/associated sxs/prior Treatment) HPI Pt is a 43yo male with hx of HIV that is well controlled with medications, pt has close f/u with ID, hx of HTN, and renal mass recently removed presenting to ED c/o abscess on his rectum that he noticed 10 days ago. Pt states he feels like it is a boil because he has had them in the past. Pain is 10/10, aching sharp, throbbing. Pt states it is difficult to have a bowel movement due to severe pain.  Denies rectal bleeding. Denies abdominal pain. Denies fever, n/v/d. Has tried ibuprofen w/o relief.  Pt last ate a sandwich around 10am this morning.  Past Medical History  Diagnosis Date  . Hypertension   . HIV (human immunodeficiency virus infection)     UNDER CONTROL WITH MEDICATIONS  . Immune deficiency disorder   . Bronchitis     LAST FLARE UP WAS NEW YEARS 2015  . Renal mass, right   . GERD (gastroesophageal reflux disease)     NO MEDS   Past Surgical History  Procedure Laterality Date  . No past surgeries    . Robot assisted laparoscopic nephrectomy Right 08/19/2013    Procedure: ROBOTIC ASSISTED LAPAROSCOPIC RIGHT PARTIAL NEPHRECTOMY;  Surgeon: Ardis Hughs, MD;  Location: [facility];  Service: Urology;  Laterality: Right;   Family History  Problem Relation Age of Onset  . Hypertension Mother   . Hypertension Sister   . Hypertension Brother    History  Substance Use Topics  . Smoking status: Former Smoker -- 0.01 packs/day    Types: Cigarettes    Quit date: 05/06/2011  . Smokeless tobacco: Never Used  . Alcohol Use: No    Review of Systems  Constitutional: Negative for fever and chills.  Gastrointestinal: Positive for rectal pain. Negative for nausea, vomiting, abdominal pain and anal  bleeding.  Genitourinary: Negative for dysuria.  Musculoskeletal: Negative for back pain.  All other systems reviewed and are negative.      Allergies  Ibuprofen; Penicillins; and Latex  Home Medications   Current Outpatient Rx  Name  Route  Sig  Dispense  Refill  . amLODipine (NORVASC) 5 MG tablet   Oral   Take 1 tablet (5 mg total) by mouth at bedtime.   30 tablet   6   . efavirenz-emtricitabine-tenofovir (ATRIPLA) 600-200-300 MG per tablet   Oral   Take 1 tablet by mouth at bedtime.   30 tablet   11   . Multiple Vitamins-Minerals (HAIR/SKIN/NAILS/BIOTIN PO)   Oral   Take 2 capsules by mouth daily.         . ciprofloxacin (CIPRO) 500 MG tablet   Oral   Take 1 tablet (500 mg total) by mouth every 12 (twelve) hours.   14 tablet   0   . metroNIDAZOLE (FLAGYL) 500 MG tablet   Oral   Take 1 tablet (500 mg total) by mouth 2 (two) times daily.   14 tablet   0   . oxyCODONE-acetaminophen (PERCOCET/ROXICET) 5-325 MG per tablet      Take 1-2 tabs every 4-6 hours as needed for pain.   10 tablet   0    BP 126/92  Pulse 63  Temp(Src) 98.1 F (36.7 C) (Oral)  Resp 16  SpO2 100% Physical Exam  Nursing note and vitals reviewed. Constitutional: He appears well-developed and well-nourished.  HENT:  Head: Normocephalic and atraumatic.  Eyes: Conjunctivae are normal. No scleral icterus.  Neck: Normal range of motion.  Cardiovascular: Normal rate, regular rhythm and normal heart sounds.   Pulmonary/Chest: Effort normal and breath sounds normal. No respiratory distress. He has no wheezes. He has no rales. He exhibits no tenderness.  Abdominal: Soft. Bowel sounds are normal. He exhibits no distension and no mass. There is no tenderness. There is no rebound and no guarding.  Genitourinary: Rectal exam shows mass and tenderness. Rectal exam shows no external hemorrhoid, no fissure and anal tone normal.  Chaperoned exam. Rectal exam- 0.5cm area of induration and  tenderness on right aspect of rectum. No active drainage. No obvious hemorrhoids. No red streaking or warmth.  Musculoskeletal: Normal range of motion.  Neurological: He is alert.  Skin: Skin is warm and dry.    ED Course  Procedures (including critical care time) Labs Review Labs Reviewed  BASIC METABOLIC PANEL - Abnormal; Notable for the following:    GFR calc non Af Amer 66 (*)    GFR calc Af Amer 77 (*)    All other components within normal limits  CBC   Imaging Review Ct Pelvis W Contrast  10/19/2013   CLINICAL DATA:  Rectal pain, question perirectal abscess.  EXAM: CT PELVIS WITH CONTRAST  TECHNIQUE: Multidetector CT imaging of the pelvis was performed using the standard protocol following the bolus administration of intravenous contrast.  CONTRAST:  162mL OMNIPAQUE IOHEXOL 300 MG/ML  SOLN  COMPARISON:  12/04/2012  FINDINGS: Appendix is visualized and is normal. There is sigmoid diverticulosis. No active diverticulitis. No perirectal fluid collection to suggest perirectal abscess. No free fluid, free air or adenopathy. Visualized lower aorta and iliac vessels are normal caliber.  No acute bony abnormality.  Urinary bladder is partially decompressed, grossly unremarkable.  IMPRESSION: No visible perirectal abnormality to suggest perirectal abscess.  Sigmoid diverticulosis.   Electronically Signed   By: Rolm Baptise M.D.   On: 10/19/2013 13:14     EKG Interpretation None      MDM   Final diagnoses:  Rectal pain   pt is a 43yo male with hx of HIV, well controlled by meds, presenting with what appears to be a rectal abscess. Pt appears well, non-toxic. Vitals: afebrile, WNL.   Rectal exam: 0.5cm area of induration and tenderness. No obvious hemorrhoids.  No rectal bleeding or drainage. Normal rectal tone.  Discussed pt with Dr. Jeneen Rinks who also examined pt. Will get basic labs: CBC and BMP, as well as CT pelvis with contrast to look for perirectal abscess. General Surgery will likely  need to be consulted.   CT pelvis: no visible perirectal abnormality to suggest perirectal abscess. Sigmoid diverticulosis.   Discussed findings with Dr. Jeneen Rinks. Will tx pt with antibiotics to prevent area of induration and tenderness from progressing into abscess. Pain has improved in ED with pain medication. Pt able to tolerate PO pain medication and fluids. Will discharge home with home care instructions.  Rx: cipro and flagyl. Advised to use sitz baths. Advised to f/u in 48hours for recheck if no improvement. Return precautions provided. Pt verbalized understanding and agreement with tx plan.   Noland Fordyce, PA-C 10/20/13 1132

## 2013-10-24 NOTE — ED Provider Notes (Signed)
Patient seen and examined. History reviewed with patient. History and physical findings reviewed with [place], [place]. CT shows no abscess. Clinically has tenderness but no palpable fluctuance or fullness.  Plan his antibiotics, 48 hour followup  Tanna Furry, MD 10/24/13 1100

## 2013-10-26 ENCOUNTER — Encounter: Payer: Self-pay | Admitting: Internal Medicine

## 2013-10-26 ENCOUNTER — Other Ambulatory Visit: Payer: Self-pay

## 2013-10-26 ENCOUNTER — Ambulatory Visit (INDEPENDENT_AMBULATORY_CARE_PROVIDER_SITE_OTHER): Payer: No Typology Code available for payment source | Admitting: Internal Medicine

## 2013-10-26 VITALS — BP 133/91 | HR 97 | Temp 97.2°F | Wt 218.0 lb

## 2013-10-26 DIAGNOSIS — B20 Human immunodeficiency virus [HIV] disease: Secondary | ICD-10-CM

## 2013-10-26 NOTE — Progress Notes (Signed)
Patient ID: Drew Cuevas, male   DOB: [birth year], 43 y.o.   MRN: [id number] @[facility]GO[facility]PT         Patient Active Problem List   Diagnosis Date Noted  . Renal cell carcinoma of right kidney 08/19/2013    Priority: High  . Essential hypertension, benign 04/28/2008    Priority: Medium  . HIV DISEASE 08/28/2006    Priority: Medium  . HIV disease 05/21/2013  . Renal cyst 05/21/2013  . Obesity (BMI 35.0-39.9 without comorbidity) 05/26/2012  . Diarrhea 11/07/2011  . Right knee pain 04/30/2011  . Recurrent boils 06/10/2009  . INTERNAL HEMORRHOIDS WITHOUT MENTION COMP 12/01/2008  . DEPRESSION, ACUTE 12/20/2006    Patient's Medications  New Prescriptions   No medications on file  Previous Medications   AMLODIPINE (NORVASC) 5 MG TABLET    Take 1 tablet (5 mg total) by mouth at bedtime.   CIPROFLOXACIN (CIPRO) 500 MG TABLET    Take 1 tablet (500 mg total) by mouth every 12 (twelve) hours.   EFAVIRENZ-EMTRICITABINE-TENOFOVIR (ATRIPLA) 600-200-300 MG PER TABLET    Take 1 tablet by mouth at bedtime.   METRONIDAZOLE (FLAGYL) 500 MG TABLET    Take 1 tablet (500 mg total) by mouth 2 (two) times daily.   MULTIPLE VITAMINS-MINERALS (HAIR/SKIN/NAILS/BIOTIN PO)    Take 2 capsules by mouth daily.  Modified Medications   No medications on file  Discontinued Medications   OXYCODONE-ACETAMINOPHEN (PERCOCET/ROXICET) 5-325 MG PER TABLET    Take 1-2 tabs every 4-6 hours as needed for pain.    Subjective: Drew Cuevas is in for his routine visit. He continues on his Atripla and denies missing any doses. He is not having any problems tolerating it. He underwent partial right nephrectomy he in January showing a renal cell carcinoma. He is scheduled for follow up scans but no other medical therapy. He recently developed perirectal swelling and tenderness and was seen in the emergency department. CT scan did not reveal any abscess. He was started on ciprofloxacin and metronidazole and is improving.  Review of  Systems: Pertinent items are noted in HPI.  Past Medical History  Diagnosis Date  . Hypertension   . HIV (human immunodeficiency virus infection)     UNDER CONTROL WITH MEDICATIONS  . Immune deficiency disorder   . Bronchitis     LAST FLARE UP WAS NEW YEARS 2015  . Renal mass, right   . GERD (gastroesophageal reflux disease)     NO MEDS    History  Substance Use Topics  . Smoking status: Former Smoker -- 0.01 packs/day    Types: Cigarettes    Quit date: 05/06/2011  . Smokeless tobacco: Never Used  . Alcohol Use: No    Family History  Problem Relation Age of Onset  . Hypertension Mother   . Hypertension Sister   . Hypertension Brother     Allergies  Allergen Reactions  . Ibuprofen Swelling and Other (See Comments)    dehydration  . Penicillins Hives  . Latex Rash    Objective: Temp: 97.2 F (36.2 C) (03/30 1606) Temp src: Oral (03/30 1606) BP: 133/91 mmHg (03/30 1606) Pulse Rate: 97 (03/30 1606) Body mass index is 35.2 kg/(m^2).  General: He is in no distress Oral: No oropharyngeal lesions Skin: No rash Lungs: Clear Cor: Regular S1-S2 no murmurs  Lab Results Lab Results  Component Value Date   WBC 6.4 10/19/2013   HGB 14.4 10/19/2013   HCT 41.4 10/19/2013   MCV 94.7 10/19/2013   PLT 240 10/19/2013  Lab Results  Component Value Date   CREATININE 1.31 10/19/2013   BUN 10 10/19/2013   NA 141 10/19/2013   K 3.8 10/19/2013   CL 106 10/19/2013   CO2 23 10/19/2013    Lab Results  Component Value Date   ALT 23 09/29/2013   AST 23 09/29/2013   ALKPHOS 98 09/29/2013   BILITOT 0.5 09/29/2013    Lab Results  Component Value Date   CHOL 173 11/13/2012   HDL 31* 11/13/2012   LDLCALC 123* 11/13/2012   TRIG 97 11/13/2012   CHOLHDL 5.6 11/13/2012    Lab Results HIV 1 RNA Quant (copies/mL)  Date Value  09/29/2013 <20   04/28/2013 <20   11/13/2012 <20      CD4 T Cell Abs (/uL)  Date Value  09/29/2013 860   04/28/2013 580   11/13/2012 460      Assessment: His HIV  infection remains under excellent control.  Plan: 1. Continue Atripla 2. Followup after blood work in 6 months   Michel Bickers, MD [facility] for [place] [phone] pager   [phone] cell 10/26/2013, 4:36 PM

## 2013-11-09 ENCOUNTER — Other Ambulatory Visit: Payer: Self-pay

## 2014-01-12 ENCOUNTER — Encounter (HOSPITAL_COMMUNITY): Payer: Self-pay | Admitting: Emergency Medicine

## 2014-01-12 ENCOUNTER — Emergency Department (HOSPITAL_COMMUNITY)
Admission: EM | Admit: 2014-01-12 | Discharge: 2014-01-12 | Disposition: A | Discharge status: Home / Self Care | Payer: No Typology Code available for payment source | Attending: Emergency Medicine | Admitting: Emergency Medicine

## 2014-01-12 DIAGNOSIS — Z862 Personal history of diseases of the blood and blood-forming organs and certain disorders involving the immune mechanism: Secondary | ICD-10-CM | POA: Insufficient documentation

## 2014-01-12 DIAGNOSIS — Z79899 Other long term (current) drug therapy: Secondary | ICD-10-CM | POA: Insufficient documentation

## 2014-01-12 DIAGNOSIS — Z880 Allergy status to penicillin: Secondary | ICD-10-CM | POA: Insufficient documentation

## 2014-01-12 DIAGNOSIS — L0591 Pilonidal cyst without abscess: Secondary | ICD-10-CM | POA: Insufficient documentation

## 2014-01-12 DIAGNOSIS — Z8709 Personal history of other diseases of the respiratory system: Secondary | ICD-10-CM | POA: Insufficient documentation

## 2014-01-12 DIAGNOSIS — I10 Essential (primary) hypertension: Secondary | ICD-10-CM | POA: Insufficient documentation

## 2014-01-12 DIAGNOSIS — Z21 Asymptomatic human immunodeficiency virus [HIV] infection status: Secondary | ICD-10-CM | POA: Insufficient documentation

## 2014-01-12 DIAGNOSIS — F17200 Nicotine dependence, unspecified, uncomplicated: Secondary | ICD-10-CM | POA: Insufficient documentation

## 2014-01-12 DIAGNOSIS — Z87448 Personal history of other diseases of urinary system: Secondary | ICD-10-CM | POA: Insufficient documentation

## 2014-01-12 DIAGNOSIS — Z8639 Personal history of other endocrine, nutritional and metabolic disease: Secondary | ICD-10-CM | POA: Insufficient documentation

## 2014-01-12 DIAGNOSIS — Z91040 Latex allergy status: Secondary | ICD-10-CM | POA: Insufficient documentation

## 2014-01-12 DIAGNOSIS — Z8719 Personal history of other diseases of the digestive system: Secondary | ICD-10-CM | POA: Insufficient documentation

## 2014-01-12 MED ORDER — HYDROXYZINE HCL 25 MG PO TABS
25.0000 mg | ORAL_TABLET | Freq: Once | ORAL | Status: AC
  Administered 2014-01-12: 25 mg via ORAL
  Filled 2014-01-12: qty 1

## 2014-01-12 MED ORDER — HYDROXYZINE HCL 25 MG PO TABS: 25.0000 mg | ORAL_TABLET | Freq: Four times a day (QID) | ORAL | Status: DC

## 2014-01-12 MED ORDER — SULFAMETHOXAZOLE-TRIMETHOPRIM 800-160 MG PO TABS: 2.0000 | ORAL_TABLET | Freq: Two times a day (BID) | ORAL | Status: DC

## 2014-01-12 MED ORDER — HYDROCODONE-ACETAMINOPHEN 5-325 MG PO TABS
1.0000 | ORAL_TABLET | Freq: Once | ORAL | Status: DC
Start: 2014-01-12 — End: 2014-01-12
  Filled 2014-01-12: qty 1

## 2014-01-12 NOTE — ED Provider Notes (Signed)
CSN: [id number]     Arrival date & time 01/12/14  0502 History   First MD Initiated Contact with Patient 01/12/14 [phone]     Chief Complaint  Patient presents with  . Rash    x3 days to buttocks     (Consider location/radiation/quality/duration/timing/severity/associated sxs/prior Treatment) HPI Comments: Patient with history of HIV on ART presents with complaint of rash between his buttocks for the past 3 days. Patient describes an itchy and painful rash. No fever, N/V. No bowel changes. No history of same. He used hydrocortisone cream on area without relief. He also used benadryl for itching. The onset of this condition was acute. The course is constant. Aggravating factors: none. Alleviating factors: none.    The history is provided by the patient.    Past Medical History  Diagnosis Date  . Hypertension   . HIV (human immunodeficiency virus infection)     UNDER CONTROL WITH MEDICATIONS  . Immune deficiency disorder   . Bronchitis     LAST FLARE UP WAS NEW YEARS 2015  . Renal mass, right   . GERD (gastroesophageal reflux disease)     NO MEDS   Past Surgical History  Procedure Laterality Date  . No past surgeries    . Robot assisted laparoscopic nephrectomy Right 08/19/2013    Procedure: ROBOTIC ASSISTED LAPAROSCOPIC RIGHT PARTIAL NEPHRECTOMY;  Surgeon: Ardis Hughs, MD;  Location: [facility];  Service: Urology;  Laterality: Right;   Family History  Problem Relation Age of Onset  . Hypertension Mother   . Hypertension Sister   . Hypertension Brother    History  Substance Use Topics  . Smoking status: Light Tobacco Smoker -- 0.25 packs/day    Types: Cigarettes  . Smokeless tobacco: Never Used  . Alcohol Use: No    Review of Systems  Constitutional: Negative for fever.  Gastrointestinal: Negative for nausea and vomiting.  Skin: Positive for rash. Negative for color change.  Hematological: Negative for adenopathy.   Allergies  Ibuprofen; Penicillins; and  Latex  Home Medications   Prior to Admission medications   Medication Sig Start Date End Date Taking? Authorizing Roshaunda Starkey  amLODipine (NORVASC) 5 MG tablet Take 1 tablet (5 mg total) by mouth at bedtime. 08/31/13  Yes Michel Bickers, MD  diphenhydrAMINE (BENADRYL) 25 mg capsule Take 25 mg by mouth every 6 (six) hours as needed for itching.   Yes Historical Bazil Dhanani, MD  efavirenz-emtricitabine-tenofovir (ATRIPLA) 600-200-300 MG per tablet Take 1 tablet by mouth at bedtime. 08/31/13  Yes Michel Bickers, MD  hydrocortisone cream 1 % Apply 1 application topically 2 (two) times daily as needed for itching.   Yes Historical Janye Maynor, MD  hydrOXYzine (ATARAX/VISTARIL) 25 MG tablet Take 1 tablet (25 mg total) by mouth every 6 (six) hours. 01/12/14   Carlisle Cater, PA-C  sulfamethoxazole-trimethoprim (BACTRIM DS,SEPTRA DS) 800-160 MG per tablet Take 2 tablets by mouth 2 (two) times daily. 01/12/14   Carlisle Cater, PA-C   BP 137/99  Pulse 76  Temp(Src) 98.3 F (36.8 C) (Oral)  Resp 18  Ht 5\' 6"  (1.676 m)  Wt 208 lb (94.348 kg)  BMI 33.59 kg/m2  SpO2 100%  Physical Exam  Nursing note and vitals reviewed. Constitutional: He appears well-developed and well-nourished.  HENT:  Head: Normocephalic and atraumatic.  Eyes: Conjunctivae are normal.  Neck: Normal range of motion. Neck supple.  Pulmonary/Chest: No respiratory distress.  Neurological: He is alert.  Skin: Skin is warm and dry.  Patient with small nickel-sized area of  minimal induration at the superior aspect of the gluteal cleft. There is no discrete area of fluctuance. Skin is mildly erythematous. No surrounding cellulitis. Area is tender to palpation.  Psychiatric: He has a normal mood and affect.    ED Course  Procedures (including critical care time) Labs Review Labs Reviewed - No data to display  Imaging Review No results found.   EKG Interpretation None      6:34 AM Patient seen and examined. Itching medication  ordered.  Vital signs reviewed and are as follows: Filed Vitals:   01/12/14 0504  BP: 137/99  Pulse: 76  Temp: 98.3 F (36.8 C)  Resp: 18   Discussed with patient that he may have a developing abscess in this area. Currently there is no substantial fluctuance which would be amenable to drainage. Discussed use of oral antibiotics and warm soaks. Patient told that he may need to return if the area becomes worse in the next 2 days for possible I&D. He verbalizes understanding and agrees with plan. He requests medicine for itching.   MDM   Final diagnoses:  Pilonidal cyst without abscess   Probable earlier pilonidal cyst early in formation. No drainable abscess at this point. No systemic symptoms. Feel given this presentation that abx and warm soaks indicated to see if area resolves, however it may very well become worse and require incision and drainage. Patient informed of this possibility and he agrees to monitor at home and return if worsen.    Carlisle Cater, PA-C 01/12/14 [phone]

## 2014-01-12 NOTE — Discharge Instructions (Signed)
Please read and follow all provided instructions.  Your diagnoses today include:  1. Pilonidal cyst without abscess     Tests performed today include:  Vital signs. See below for your results today.   Medications prescribed:   Bactrim (trimethoprim/sulfamethoxazole) - antibiotic  You have been prescribed an antibiotic medicine: take the entire course of medicine even if you are feeling better. Stopping early can cause the antibiotic not to work.   Hydroxyzine - antihistamine medication for itching  You can find this medication over-the-counter.   This medication will make you drowsy. DO NOT drive or perform any activities that require you to be awake and alert if taking this.  Take any prescribed medications only as directed.   Home care instructions:   Follow any educational materials contained in this packet  Follow-up instructions: Return to the Emergency Department in 48 hours for a recheck if your symptoms are not significantly improved. As we discussed, this area may get worse and require drainage in the near future.   Please follow-up with your primary care provider in the next 1 week for further evaluation of your symptoms. If you do not have a primary care doctor -- see below for referral information.   Return instructions:  Return to the Emergency Department if you have:  Fever  Worsening symptoms  Worsening pain  Worsening swelling  Redness of the skin that moves away from the affected area, especially if it streaks away from the affected area   Any other emergent concerns  Your vital signs today were: BP 137/99   Pulse 76   Temp(Src) 98.3 F (36.8 C) (Oral)   Resp 18   Ht 5\' 6"  (1.676 m)   Wt 208 lb (94.348 kg)   BMI 33.59 kg/m2   SpO2 100% If your blood pressure (BP) was elevated above 135/85 this visit, please have this repeated by your doctor within one month. --------------

## 2014-01-12 NOTE — ED Notes (Signed)
Patient states he has a rash to his buttocks x3 days. States he has taken benadryl that only slightly eased his discomfort temporarily and has also used hydrocortisone cream. Patient reports similar prior episodes. Patient c/o itching and burning

## 2014-01-13 ENCOUNTER — Encounter (HOSPITAL_COMMUNITY): Payer: Self-pay | Admitting: Emergency Medicine

## 2014-01-13 ENCOUNTER — Emergency Department (HOSPITAL_COMMUNITY)
Admission: EM | Admit: 2014-01-13 | Discharge: 2014-01-13 | Disposition: A | Discharge status: Home / Self Care | Payer: No Typology Code available for payment source | Attending: Emergency Medicine | Admitting: Emergency Medicine

## 2014-01-13 DIAGNOSIS — Z21 Asymptomatic human immunodeficiency virus [HIV] infection status: Secondary | ICD-10-CM | POA: Insufficient documentation

## 2014-01-13 DIAGNOSIS — K602 Anal fissure, unspecified: Secondary | ICD-10-CM | POA: Insufficient documentation

## 2014-01-13 DIAGNOSIS — Z87448 Personal history of other diseases of urinary system: Secondary | ICD-10-CM | POA: Insufficient documentation

## 2014-01-13 DIAGNOSIS — Z862 Personal history of diseases of the blood and blood-forming organs and certain disorders involving the immune mechanism: Secondary | ICD-10-CM | POA: Insufficient documentation

## 2014-01-13 DIAGNOSIS — Z8709 Personal history of other diseases of the respiratory system: Secondary | ICD-10-CM | POA: Insufficient documentation

## 2014-01-13 DIAGNOSIS — L0501 Pilonidal cyst with abscess: Secondary | ICD-10-CM | POA: Insufficient documentation

## 2014-01-13 DIAGNOSIS — Z8639 Personal history of other endocrine, nutritional and metabolic disease: Secondary | ICD-10-CM | POA: Insufficient documentation

## 2014-01-13 DIAGNOSIS — Z792 Long term (current) use of antibiotics: Secondary | ICD-10-CM | POA: Insufficient documentation

## 2014-01-13 DIAGNOSIS — Z8719 Personal history of other diseases of the digestive system: Secondary | ICD-10-CM | POA: Insufficient documentation

## 2014-01-13 DIAGNOSIS — I10 Essential (primary) hypertension: Secondary | ICD-10-CM | POA: Insufficient documentation

## 2014-01-13 DIAGNOSIS — F17200 Nicotine dependence, unspecified, uncomplicated: Secondary | ICD-10-CM | POA: Insufficient documentation

## 2014-01-13 DIAGNOSIS — Z91040 Latex allergy status: Secondary | ICD-10-CM | POA: Insufficient documentation

## 2014-01-13 DIAGNOSIS — Z880 Allergy status to penicillin: Secondary | ICD-10-CM | POA: Insufficient documentation

## 2014-01-13 MED ORDER — OXYCODONE-ACETAMINOPHEN 5-325 MG PO TABS
2.0000 | ORAL_TABLET | Freq: Once | ORAL | Status: AC
  Administered 2014-01-13: 2 via ORAL
  Filled 2014-01-13: qty 2

## 2014-01-13 MED ORDER — HYDROCODONE-ACETAMINOPHEN 5-325 MG PO TABS: 1.0000 | ORAL_TABLET | Freq: Four times a day (QID) | ORAL | Status: DC | PRN

## 2014-01-13 NOTE — ED Provider Notes (Signed)
Medical screening examination/treatment/procedure(s) were performed by non-physician practitioner and as supervising physician I was immediately available for consultation/collaboration.   EKG Interpretation None       Abbigaile Rockman K Najiyah Paris-Rasch, MD 01/13/14 0127

## 2014-01-13 NOTE — ED Notes (Signed)
C/o "boil' on  Buttock. Was seen at [facility] yesterday, placed on abx. Pt states "it has doubled in size".

## 2014-01-13 NOTE — Discharge Instructions (Signed)
Continue to take your antibiotics as prescribed and perform warm soaks and warm compresses 3-4 times daily.  Take pain medication as needed for severe pain, do not drive while taking as this may cause drowsiness.  Follow up with your primary care provider for ongoing healthcare needs. See below for further instructions.   Pilonidal Cyst A pilonidal cyst occurs when hairs get trapped (ingrown) beneath the skin in the crease between the buttocks over your sacrum (the bone under that crease). Pilonidal cysts are most common in young men with a lot of body hair. When the cyst is ruptured (breaks) or leaking, fluid from the cyst may cause burning and itching. If the cyst becomes infected, it causes a painful swelling filled with pus (abscess). The pus and trapped hairs need to be removed (often by lancing) so that the infection can heal. However, recurrence is common and an operation may be needed to remove the cyst. HOME CARE INSTRUCTIONS   If the cyst was NOT INFECTED:  Keep the area clean and dry. Bathe or shower daily. Wash the area well with a germ-killing soap. Warm tub baths may help prevent infection and help with drainage. Dry the area well with a towel.  Avoid tight clothing to keep area as moisture free as possible.  Keep area between buttocks as free of hair as possible. A depilatory may be used.  If the cyst WAS INFECTED and needed to be drained:  Your caregiver packed the wound with gauze to keep the wound open. This allows the wound to heal from the inside outwards and continue draining.  Return for a wound check in 1 day or as suggested.  If you take tub baths or showers, repack the wound with gauze following them. Sponge baths (at the sink) are a good alternative.  If an antibiotic was ordered to fight the infection, take as directed.  Only take over-the-counter or prescription medicines for pain, discomfort, or fever as directed by your caregiver.  After the drain is removed,  use sitz baths for 20 minutes 4 times per day. Clean the wound gently with mild unscented soap, pat dry, and then apply a dry dressing. SEEK MEDICAL CARE IF:   You have increased pain, swelling, redness, drainage, or bleeding from the area.  You have a fever.  You have muscles aches, dizziness, or a general ill feeling. Document Released: 07/13/2000 Document Revised: 10/08/2011 Document Reviewed: 09/10/2008 [facility] Patient Information 2015 [place], [place]. This information is not intended to replace advice given to you by your health care provider. Make sure you discuss any questions you have with your health care provider.  Anal Fissure, Adult An anal fissure is a small tear or crack in the skin around the opening of the butt (anus).Bleeding from the tear or crack usually stops on its own within a few minutes. The bleeding may happen every time you poop until the tear or crack heals. HOME CARE  Eat lots of fruit, whole grains, and vegetables. Avoid foods like bananas and dairy products. These foods can make it hard to poop.  Take a warm water bath (sitz bath) as told by your doctor.  Drink enough fluids to keep your pee (urine) clear or pale yellow.  Only take medicines as told by your doctor. Do not take aspirin.  Do not use numbing creams or hydrocortisone cream on the area. These creams can slow healing. GET HELP RIGHT AWAY IF:  Your tear or crack is not healed in 3 days.  You have more bleeding.  You have a fever.  You have watery poop (diarrhea) mixed with blood.  You have pain.  You are getting worse, not better. MAKE SURE YOU:   Understand these instructions.  Will watch your condition.  Will get help right away if you are not doing well or get worse. Document Released: 03/14/2011 Document Revised: 10/08/2011 Document Reviewed: 03/14/2011 [facility] Patient Information 2014 [place], [place].

## 2014-01-13 NOTE — ED Provider Notes (Signed)
CSN: [id number]     Arrival date & time 01/13/14  1053 History  This chart was scribed for non-physician practitioner, Noland Fordyce, PA-C working with Neta Ehlers, MD by Frederich Balding, ED scribe. This patient was seen in room TR11C/TR11C and the patient's care was started at 11:07 AM.   Chief Complaint  Patient presents with  . Abscess   The history is provided by the patient. No language interpreter was used.   HPI Comments: Drew Cuevas is a 43 y.o. male with history of HIV who presents to the Emergency Department complaining of an abscess to his buttocks that started 4 days ago. Pt was evaluated at [facility] yesterday for the same but states it wasn't I&D. He was discharged home with bactrim and told to do warm soaks. It has doubled in size since yesterday and states the pain has increased. Rates pain 10/10. He states it originally started has an itchy rash to the area so he has tried hydrocortisone cream with no relief. States he has also used Lawyer with no relief. Denies fever, nausea, emesis. Pt has had abscess drained in the same area in the past. The last time was a little less than one year ago.   Past Medical History  Diagnosis Date  . Hypertension   . HIV (human immunodeficiency virus infection)     UNDER CONTROL WITH MEDICATIONS  . Immune deficiency disorder   . Bronchitis     LAST FLARE UP WAS NEW YEARS 2015  . Renal mass, right   . GERD (gastroesophageal reflux disease)     NO MEDS   Past Surgical History  Procedure Laterality Date  . No past surgeries    . Robot assisted laparoscopic nephrectomy Right 08/19/2013    Procedure: ROBOTIC ASSISTED LAPAROSCOPIC RIGHT PARTIAL NEPHRECTOMY;  Surgeon: Ardis Hughs, MD;  Location: [facility];  Service: Urology;  Laterality: Right;   Family History  Problem Relation Age of Onset  . Hypertension Mother   . Hypertension Sister   . Hypertension Brother    History  Substance Use Topics  . Smoking status: Light  Tobacco Smoker -- 0.25 packs/day    Types: Cigarettes  . Smokeless tobacco: Never Used  . Alcohol Use: No    Review of Systems  Constitutional: Negative for fever.  Gastrointestinal: Negative for nausea and vomiting.  Skin:       Abscess.  All other systems reviewed and are negative.  Allergies  Ibuprofen; Penicillins; and Latex  Home Medications   Prior to Admission medications   Medication Sig Start Date End Date Taking? Authorizing Sherissa Tenenbaum  amLODipine (NORVASC) 5 MG tablet Take 1 tablet (5 mg total) by mouth at bedtime. 08/31/13  Yes Michel Bickers, MD  efavirenz-emtricitabine-tenofovir (ATRIPLA) [phone] MG per tablet Take 1 tablet by mouth at bedtime. 08/31/13  Yes Michel Bickers, MD  sulfamethoxazole-trimethoprim (BACTRIM DS,SEPTRA DS) 800-160 MG per tablet Take 2 tablets by mouth 2 (two) times daily. 01/12/14  Yes Carlisle Cater, PA-C  HYDROcodone-acetaminophen (NORCO/VICODIN) 5-325 MG per tablet Take 1-2 tablets by mouth every 6 (six) hours as needed for moderate pain or severe pain. 01/13/14   Noland Fordyce, PA-C   BP 141/91  Pulse 75  Temp(Src) 97.4 F (36.3 C) (Oral)  Resp 18  SpO2 99%  Physical Exam  Nursing note and vitals reviewed. Constitutional: He is oriented to person, place, and time. He appears well-developed and well-nourished.  HENT:  Head: Normocephalic and atraumatic.  Eyes: EOM are normal.  Neck:  Normal range of motion.  Cardiovascular: Normal rate.   Pulmonary/Chest: Effort normal.  Genitourinary:  Fissure along gluteal cleft. Tender to palpation. Scant red blood. No active discharge. No obvious fluctuance.   Musculoskeletal: Normal range of motion.  Neurological: He is alert and oriented to person, place, and time.  Skin: Skin is warm and dry.  2 cm area of induration on right and left side of gluteal cleft. Tenderness.   Psychiatric: He has a normal mood and affect. His behavior is normal.   ED Course  Procedures (including critical care  time)  DIAGNOSTIC STUDIES: Oxygen Saturation is 99% on RA, normal by my interpretation.    COORDINATION OF CARE: 11:10 AM-Discussed treatment plan which includes I&D and pain medication with pt at bedside and pt agreed to plan. Advised pt to continue antibiotic and warm compresses.   INCISION AND DRAINAGE Performed by: Noland Fordyce, PA-C Consent: Verbal consent obtained. Risks and benefits: risks, benefits and alternatives were discussed Type: abscess  Body area: gluteal cleft  Anesthesia: local infiltration  Incision was made with a scalpel.  Local anesthetic: lidocaine 2% with epinephrine  Anesthetic total: 0.5 ml  Complexity: complex Blunt dissection to break up loculations  Drainage: purulent, bloody  Drainage amount: small  Packing material: none  Patient tolerance: Patient tolerated the procedure well with no immediate complications.  Labs Review Labs Reviewed - No data to display  Imaging Review No results found.   EKG Interpretation None      MDM   Final diagnoses:  Anal fissure  Pilonidal cyst with abscess    Pt presenting to ED c/o buttock pain. tx yesterday for pilonidal cyst. Today, pt has anal fissure and 2 areas of induration and tenderness along gluteal cleft.  I&D attempted. Scant purulent and bloody discharged. Advised pt to continue warm soaks and bactrim. Pain medication also provided. Advised pt to f/u with PCP and general surgery for recurrent pilonidal cyst pain. Return precautions provided. Pt verbalized understanding and agreement with tx plan.   I personally performed the services described in this documentation, which was scribed in my presence. The recorded information has been reviewed and is accurate.  Noland Fordyce, PA-C 01/13/14 1130

## 2014-01-13 NOTE — ED Provider Notes (Signed)
Medical screening examination/treatment/procedure(s) were performed by non-physician practitioner and as supervising physician I was immediately available for consultation/collaboration.   Megan E Docherty, MD 01/13/14 1214 

## 2014-01-28 ENCOUNTER — Other Ambulatory Visit: Payer: Self-pay | Admitting: *Deleted

## 2014-01-28 ENCOUNTER — Ambulatory Visit: Payer: No Typology Code available for payment source | Attending: Internal Medicine

## 2014-01-28 DIAGNOSIS — B20 Human immunodeficiency virus [HIV] disease: Secondary | ICD-10-CM

## 2014-01-28 MED ORDER — EFAVIRENZ-EMTRICITAB-TENOFOVIR 600-200-300 MG PO TABS: 1.0000 | ORAL_TABLET | Freq: Every day | ORAL | Status: DC

## 2014-02-03 ENCOUNTER — Other Ambulatory Visit: Payer: No Typology Code available for payment source

## 2014-02-08 ENCOUNTER — Other Ambulatory Visit: Payer: No Typology Code available for payment source

## 2014-02-16 ENCOUNTER — Ambulatory Visit (HOSPITAL_COMMUNITY): Payer: No Typology Code available for payment source

## 2014-02-16 ENCOUNTER — Ambulatory Visit: Payer: No Typology Code available for payment source | Attending: Internal Medicine

## 2014-02-16 ENCOUNTER — Ambulatory Visit: Payer: No Typology Code available for payment source | Admitting: Internal Medicine

## 2014-02-16 ENCOUNTER — Other Ambulatory Visit (HOSPITAL_COMMUNITY): Payer: Self-pay | Admitting: Urology

## 2014-02-16 DIAGNOSIS — B20 Human immunodeficiency virus [HIV] disease: Secondary | ICD-10-CM

## 2014-02-16 DIAGNOSIS — C641 Malignant neoplasm of right kidney, except renal pelvis: Secondary | ICD-10-CM

## 2014-02-17 ENCOUNTER — Other Ambulatory Visit: Payer: Self-pay

## 2014-02-17 DIAGNOSIS — B20 Human immunodeficiency virus [HIV] disease: Secondary | ICD-10-CM

## 2014-02-17 LAB — COMPREHENSIVE METABOLIC PANEL
ALBUMIN: 4 g/dL (ref 3.5–5.2)
ALT: 28 U/L (ref 0–53)
AST: 22 U/L (ref 0–37)
Alkaline Phosphatase: 90 U/L (ref 39–117)
BILIRUBIN TOTAL: 0.3 mg/dL (ref 0.2–1.2)
BUN: 12 mg/dL (ref 6–23)
CO2: 23 mEq/L (ref 19–32)
CREATININE: 1.2 mg/dL (ref 0.50–1.35)
Calcium: 8.7 mg/dL (ref 8.4–10.5)
Chloride: 109 mEq/L (ref 96–112)
GLUCOSE: 94 mg/dL (ref 70–99)
Potassium: 4.5 mEq/L (ref 3.5–5.3)
Sodium: 140 mEq/L (ref 135–145)
Total Protein: 6.8 g/dL (ref 6.0–8.3)

## 2014-02-18 ENCOUNTER — Ambulatory Visit (HOSPITAL_COMMUNITY)
Admission: RE | Admit: 2014-02-18 | Discharge: 2014-02-18 | Disposition: A | Discharge status: Home / Self Care | Payer: No Typology Code available for payment source | Source: Ambulatory Visit | Attending: Urology | Admitting: Urology

## 2014-02-18 DIAGNOSIS — F17200 Nicotine dependence, unspecified, uncomplicated: Secondary | ICD-10-CM | POA: Insufficient documentation

## 2014-02-18 DIAGNOSIS — Z905 Acquired absence of kidney: Secondary | ICD-10-CM | POA: Insufficient documentation

## 2014-02-18 DIAGNOSIS — C649 Malignant neoplasm of unspecified kidney, except renal pelvis: Secondary | ICD-10-CM | POA: Insufficient documentation

## 2014-02-18 DIAGNOSIS — C641 Malignant neoplasm of right kidney, except renal pelvis: Secondary | ICD-10-CM

## 2014-02-18 DIAGNOSIS — N281 Cyst of kidney, acquired: Secondary | ICD-10-CM | POA: Insufficient documentation

## 2014-02-18 MED ORDER — IOHEXOL 300 MG/ML  SOLN
100.0000 mL | Freq: Once | INTRAMUSCULAR | Status: AC | PRN
  Administered 2014-02-18: 100 mL via INTRAVENOUS

## 2014-02-18 MED ORDER — IOHEXOL 300 MG/ML  SOLN
50.0000 mL | Freq: Once | INTRAMUSCULAR | Status: AC | PRN
  Administered 2014-02-18: 50 mL via ORAL

## 2014-02-23 ENCOUNTER — Telehealth: Payer: Self-pay | Admitting: Emergency Medicine

## 2014-02-23 NOTE — Telephone Encounter (Signed)
Left message for pt to call when message received for blood work

## 2014-03-02 ENCOUNTER — Ambulatory Visit: Payer: No Typology Code available for payment source | Admitting: Internal Medicine

## 2014-03-02 ENCOUNTER — Encounter: Payer: Self-pay | Admitting: Internal Medicine

## 2014-03-02 ENCOUNTER — Ambulatory Visit: Payer: No Typology Code available for payment source | Attending: Internal Medicine | Admitting: Internal Medicine

## 2014-03-02 VITALS — BP 121/88 | HR 72 | Temp 98.4°F | Resp 16 | Ht 66.0 in | Wt 199.0 lb

## 2014-03-02 DIAGNOSIS — I10 Essential (primary) hypertension: Secondary | ICD-10-CM | POA: Diagnosis not present

## 2014-03-02 DIAGNOSIS — B20 Human immunodeficiency virus [HIV] disease: Secondary | ICD-10-CM | POA: Insufficient documentation

## 2014-03-02 MED ORDER — AMLODIPINE BESYLATE 5 MG PO TABS: 5.0000 mg | ORAL_TABLET | Freq: Every day | ORAL | Status: DC

## 2014-03-02 NOTE — Patient Instructions (Signed)
DASH Eating Plan DASH stands for "Dietary Approaches to Stop Hypertension." The DASH eating plan is a healthy eating plan that has been shown to reduce high blood pressure (hypertension). Additional health benefits may include reducing the risk of type 2 diabetes mellitus, heart disease, and stroke. The DASH eating plan may also help with weight loss. WHAT DO I NEED TO KNOW ABOUT THE DASH EATING PLAN? For the DASH eating plan, you will follow these general guidelines:  Choose foods with a percent daily value for sodium of less than 5% (as listed on the food label).  Use salt-free seasonings or herbs instead of table salt or sea salt.  Check with your health care provider or pharmacist before using salt substitutes.  Eat lower-sodium products, often labeled as "lower sodium" or "no salt added."  Eat fresh foods.  Eat more vegetables, fruits, and low-fat dairy products.  Choose whole grains. Look for the word "whole" as the first word in the ingredient list.  Choose fish and skinless chicken or turkey more often than red meat. Limit fish, poultry, and meat to 6 oz (170 g) each day.  Limit sweets, desserts, sugars, and sugary drinks.  Choose heart-healthy fats.  Limit cheese to 1 oz (28 g) per day.  Eat more home-cooked food and less restaurant, buffet, and fast food.  Limit fried foods.  Cook foods using methods other than frying.  Limit canned vegetables. If you do use them, rinse them well to decrease the sodium.  When eating at a restaurant, ask that your food be prepared with less salt, or no salt if possible. WHAT FOODS CAN I EAT? Seek help from a dietitian for individual calorie needs. Grains Whole grain or whole wheat bread. Brown rice. Whole grain or whole wheat pasta. Quinoa, bulgur, and whole grain cereals. Low-sodium cereals. Corn or whole wheat flour tortillas. Whole grain cornbread. Whole grain crackers. Low-sodium crackers. Vegetables Fresh or frozen vegetables  (raw, steamed, roasted, or grilled). Low-sodium or reduced-sodium tomato and vegetable juices. Low-sodium or reduced-sodium tomato sauce and paste. Low-sodium or reduced-sodium canned vegetables.  Fruits All fresh, canned (in natural juice), or frozen fruits. Meat and Other Protein Products Ground beef (85% or leaner), grass-fed beef, or beef trimmed of fat. Skinless chicken or turkey. Ground chicken or turkey. Pork trimmed of fat. All fish and seafood. Eggs. Dried beans, peas, or lentils. Unsalted nuts and seeds. Unsalted canned beans. Dairy Low-fat dairy products, such as skim or 1% milk, 2% or reduced-fat cheeses, low-fat ricotta or cottage cheese, or plain low-fat yogurt. Low-sodium or reduced-sodium cheeses. Fats and Oils Tub margarines without trans fats. Light or reduced-fat mayonnaise and salad dressings (reduced sodium). Avocado. Safflower, olive, or canola oils. Natural peanut or almond butter. Other Unsalted popcorn and pretzels. The items listed above may not be a complete list of recommended foods or beverages. Contact your dietitian for more options. WHAT FOODS ARE NOT RECOMMENDED? Grains White bread. White pasta. White rice. Refined cornbread. Bagels and croissants. Crackers that contain trans fat. Vegetables Creamed or fried vegetables. Vegetables in a cheese sauce. Regular canned vegetables. Regular canned tomato sauce and paste. Regular tomato and vegetable juices. Fruits Dried fruits. Canned fruit in light or heavy syrup. Fruit juice. Meat and Other Protein Products Fatty cuts of meat. Ribs, chicken wings, bacon, sausage, bologna, salami, chitterlings, fatback, hot dogs, bratwurst, and packaged luncheon meats. Salted nuts and seeds. Canned beans with salt. Dairy Whole or 2% milk, cream, half-and-half, and cream cheese. Whole-fat or sweetened yogurt. Full-fat   cheeses or blue cheese. Nondairy creamers and whipped toppings. Processed cheese, cheese spreads, or cheese  curds. Condiments Onion and garlic salt, seasoned salt, table salt, and sea salt. Canned and packaged gravies. Worcestershire sauce. Tartar sauce. Barbecue sauce. Teriyaki sauce. Soy sauce, including reduced sodium. Steak sauce. Fish sauce. Oyster sauce. Cocktail sauce. Horseradish. Ketchup and mustard. Meat flavorings and tenderizers. Bouillon cubes. Hot sauce. Tabasco sauce. Marinades. Taco seasonings. Relishes. Fats and Oils Butter, stick margarine, lard, shortening, ghee, and bacon fat. Coconut, palm kernel, or palm oils. Regular salad dressings. Other Pickles and olives. Salted popcorn and pretzels. The items listed above may not be a complete list of foods and beverages to avoid. Contact your dietitian for more information. WHERE CAN I FIND MORE INFORMATION? National Heart, Lung, and Blood Institute: www.nhlbi.nih.gov/health/health-topics/topics/dash/ Document Released: 07/05/2011 Document Revised: 11/30/2013 Document Reviewed: 05/20/2013 ExitCare Patient Information 2015 [employer]. This information is not intended to replace advice given to you by your health care provider. Make sure you discuss any questions you have with your health care provider. Hypertension Hypertension, commonly called high blood pressure, is when the force of blood pumping through your arteries is too strong. Your arteries are the blood vessels that carry blood from your heart throughout your body. A blood pressure reading consists of a higher number over a lower number, such as 110/72. The higher number (systolic) is the pressure inside your arteries when your heart pumps. The lower number (diastolic) is the pressure inside your arteries when your heart relaxes. Ideally you want your blood pressure below 120/80. Hypertension forces your heart to work harder to pump blood. Your arteries may become narrow or stiff. Having hypertension puts you at risk for heart disease, stroke, and other problems.  RISK  FACTORS Some risk factors for high blood pressure are controllable. Others are not.  Risk factors you cannot control include:   Race. You may be at higher risk if you are African American.  Age. Risk increases with age.  Gender. Men are at higher risk than women before age 45 years. After age 65, women are at higher risk than men. Risk factors you can control include:  Not getting enough exercise or physical activity.  Being overweight.  Getting too much fat, sugar, calories, or salt in your diet.  Drinking too much alcohol. SIGNS AND SYMPTOMS Hypertension does not usually cause signs or symptoms. Extremely high blood pressure (hypertensive crisis) may cause headache, anxiety, shortness of breath, and nosebleed. DIAGNOSIS  To check if you have hypertension, your health care provider will measure your blood pressure while you are seated, with your arm held at the level of your heart. It should be measured at least twice using the same arm. Certain conditions can cause a difference in blood pressure between your right and left arms. A blood pressure reading that is higher than normal on one occasion does not mean that you need treatment. If one blood pressure reading is high, ask your health care provider about having it checked again. TREATMENT  Treating high blood pressure includes making lifestyle changes and possibly taking medicine. Living a healthy lifestyle can help lower high blood pressure. You may need to change some of your habits. Lifestyle changes may include:  Following the DASH diet. This diet is high in fruits, vegetables, and whole grains. It is low in salt, red meat, and added sugars.  Getting at least 2 hours of brisk physical activity every week.  Losing weight if necessary.  Not smoking.  Limiting   alcoholic beverages.  Learning ways to reduce stress. If lifestyle changes are not enough to get your blood pressure under control, your health care provider may  prescribe medicine. You may need to take more than one. Work closely with your health care provider to understand the risks and benefits. HOME CARE INSTRUCTIONS  Have your blood pressure rechecked as directed by your health care provider.   Take medicines only as directed by your health care provider. Follow the directions carefully. Blood pressure medicines must be taken as prescribed. The medicine does not work as well when you skip doses. Skipping doses also puts you at risk for problems.   Do not smoke.   Monitor your blood pressure at home as directed by your health care provider. SEEK MEDICAL CARE IF:   You think you are having a reaction to medicines taken.  You have recurrent headaches or feel dizzy.  You have swelling in your ankles.  You have trouble with your vision. SEEK IMMEDIATE MEDICAL CARE IF:  You develop a severe headache or confusion.  You have unusual weakness, numbness, or feel faint.  You have severe chest or abdominal pain.  You vomit repeatedly.  You have trouble breathing. MAKE SURE YOU:   Understand these instructions.  Will watch your condition.  Will get help right away if you are not doing well or get worse. Document Released: 07/16/2005 Document Revised: 11/30/2013 Document Reviewed: 05/08/2013 ExitCare Patient Information 2015 Exit[employer], [employer]. This information is not intended to replace advice given to you by your health care provider. Make sure you discuss any questions you have with your health care provider.  

## 2014-03-02 NOTE — Progress Notes (Signed)
Patient ID: Drew Cuevas, male   DOB: Dec 22, 1970, 43 y.o.   MRN: [id number]   Drew Cuevas, is a 43 y.o. male  OIZ:[id number]  [id number]:[id number]  DOB - [birth year]  Chief Complaint  Patient presents with  . Follow-up  . Medication Refill  . Hypertension        Subjective:   Drew Cuevas is a 43 y.o. male here today for a follow up visit. Pt is here to f/u with blood pressure medication refill.  Pt is following ID specialist Dr. Megan Salon for HIV meds management. Denies pain issues at this time. Pt need repeat blood work due to lab error. He underwent partial right nephrectomy he in January showing a renal cell carcinoma. He is scheduled for follow up scans but no other medical therapy. Patient has No headache, No chest pain, No abdominal pain - No Nausea, No new weakness tingling or numbness, No Cough - SOB.  No problems updated.  ALLERGIES: Allergies  Allergen Reactions  . Ibuprofen Swelling and Other (See Comments)    dehydration  . Penicillins Hives  . Latex Rash    PAST MEDICAL HISTORY: Past Medical History  Diagnosis Date  . Hypertension   . HIV (human immunodeficiency virus infection)     UNDER CONTROL WITH MEDICATIONS  . Immune deficiency disorder   . Bronchitis     LAST FLARE UP WAS NEW YEARS 2015  . Renal mass, right   . GERD (gastroesophageal reflux disease)     NO MEDS    MEDICATIONS AT HOME: Prior to Admission medications   Medication Sig Start Date End Date Taking? Authorizing Provider  amLODipine (NORVASC) 5 MG tablet Take 1 tablet (5 mg total) by mouth at bedtime. 03/02/14   Angelica Chessman, MD  efavirenz-emtricitabine-tenofovir (ATRIPLA) 600-200-300 MG per tablet Take 1 tablet by mouth at bedtime. 01/28/14   Carlyle Basques, MD  HYDROcodone-acetaminophen (NORCO/VICODIN) 5-325 MG per tablet Take 1-2 tablets by mouth every 6 (six) hours as needed for moderate pain or severe pain. 01/13/14   Noland Fordyce, PA-C  sulfamethoxazole-trimethoprim (BACTRIM DS,SEPTRA  DS) 800-160 MG per tablet Take 2 tablets by mouth 2 (two) times daily. 01/12/14   Carlisle Cater, PA-C     Objective:   Filed Vitals:   03/02/14 1121  BP: 121/88  Pulse: 72  Temp: 98.4 F (36.9 C)  TempSrc: Oral  Resp: 16  Height: 5\' 6"  (1.676 m)  Weight: 199 lb (90.266 kg)  SpO2: 100%    Exam General appearance : Awake, alert, not in any distress. Speech Clear. Not toxic looking HEENT: Atraumatic and Normocephalic, pupils equally reactive to light and accomodation Neck: supple, no JVD. No cervical lymphadenopathy.  Chest:Good air entry bilaterally, no added sounds  CVS: S1 S2 regular, no murmurs.  Abdomen: Bowel sounds present, Non tender and not distended with no gaurding, rigidity or rebound. Extremities: B/L Lower Ext shows no edema, both legs are warm to touch Neurology: Awake alert, and oriented X 3, CN II-XII intact, Non focal Skin:No Rash Wounds:N/A  Data Review No results found for this basename: HGBA1C     Assessment & Plan   1. HIV disease  - T-helper cells (CD4) count - HIV 1 RNA quant-no reflex-bld  2. Essential hypertension Refill antihypertensive - amLODipine (NORVASC) 5 MG tablet; Take 1 tablet (5 mg total) by mouth at bedtime.  Dispense: 90 tablet; Refill: 3  Patient has been counseled extensively about nutrition and exercise  Return in about 6 months (around 09/02/2014), or if symptoms worsen  or fail to improve, for Follow up HTN.  The patient was given clear instructions to go to ER or return to medical center if symptoms don't improve, worsen or new problems develop. The patient verbalized understanding. The patient was told to call to get lab results if they haven't heard anything in the next week.   This note has been created with Surveyor, quantity. Any transcriptional errors are unintentional.    Angelica Chessman, MD, [place], [place], [place] and [place],  [place]   03/02/2014, 11:58 AM

## 2014-03-02 NOTE — Progress Notes (Signed)
Pt here to f/u with blood pressure medication refill. Pt is following ID Dr. Megan Salon for HIV meds management Denies pain issues at this time Pt need repeat blood work due to lab error

## 2014-03-04 LAB — HIV-1 RNA QUANT-NO REFLEX-BLD: HIV 1 RNA Quant: <20 copies/mL (ref <20)

## 2014-03-08 ENCOUNTER — Telehealth: Payer: Self-pay | Admitting: Emergency Medicine

## 2014-03-08 NOTE — Telephone Encounter (Signed)
Pt given lab results 

## 2014-03-08 NOTE — Telephone Encounter (Signed)
Message copied by Ricci Barker on Mon Mar 08, 2014  4:04 PM ------      Message from: Tresa Garter      Created: Sun Mar 07, 2014  9:10 PM       Please inform patient that his HIV viral load is less than 20 copies ------

## 2014-03-19 ENCOUNTER — Emergency Department (HOSPITAL_COMMUNITY)
Admission: EM | Admit: 2014-03-19 | Discharge: 2014-03-19 | Disposition: A | Discharge status: Home / Self Care | Payer: No Typology Code available for payment source | Attending: Emergency Medicine | Admitting: Emergency Medicine

## 2014-03-19 ENCOUNTER — Encounter (HOSPITAL_COMMUNITY): Payer: Self-pay | Admitting: Emergency Medicine

## 2014-03-19 DIAGNOSIS — Z87448 Personal history of other diseases of urinary system: Secondary | ICD-10-CM | POA: Insufficient documentation

## 2014-03-19 DIAGNOSIS — Z8719 Personal history of other diseases of the digestive system: Secondary | ICD-10-CM | POA: Insufficient documentation

## 2014-03-19 DIAGNOSIS — F17200 Nicotine dependence, unspecified, uncomplicated: Secondary | ICD-10-CM | POA: Diagnosis not present

## 2014-03-19 DIAGNOSIS — Z21 Asymptomatic human immunodeficiency virus [HIV] infection status: Secondary | ICD-10-CM | POA: Diagnosis not present

## 2014-03-19 DIAGNOSIS — R21 Rash and other nonspecific skin eruption: Secondary | ICD-10-CM | POA: Diagnosis present

## 2014-03-19 DIAGNOSIS — M7710 Lateral epicondylitis, unspecified elbow: Secondary | ICD-10-CM | POA: Diagnosis not present

## 2014-03-19 DIAGNOSIS — L309 Dermatitis, unspecified: Secondary | ICD-10-CM

## 2014-03-19 DIAGNOSIS — M7712 Lateral epicondylitis, left elbow: Secondary | ICD-10-CM

## 2014-03-19 DIAGNOSIS — Z91040 Latex allergy status: Secondary | ICD-10-CM | POA: Diagnosis not present

## 2014-03-19 DIAGNOSIS — I10 Essential (primary) hypertension: Secondary | ICD-10-CM | POA: Insufficient documentation

## 2014-03-19 DIAGNOSIS — Z792 Long term (current) use of antibiotics: Secondary | ICD-10-CM | POA: Diagnosis not present

## 2014-03-19 DIAGNOSIS — L259 Unspecified contact dermatitis, unspecified cause: Secondary | ICD-10-CM | POA: Diagnosis not present

## 2014-03-19 DIAGNOSIS — Z880 Allergy status to penicillin: Secondary | ICD-10-CM | POA: Diagnosis not present

## 2014-03-19 NOTE — ED Notes (Signed)
Pt not in room to receive d/c instructions.

## 2014-03-19 NOTE — Discharge Instructions (Signed)
Your left arm pain is from an irritation of the tendons called epicondylitis.  It tends to come from repetitive motion injury.  Use naproxen or Motrin as an anti-inflammatory.  Skin abnormalities on your arm are nonspecific. Keep themclean, and covered with antibiotic ointment, and a simple band-aid.

## 2014-03-19 NOTE — ED Provider Notes (Signed)
CSN: [id number]     Arrival date & time 03/19/14  1833 History   First MD Initiated Contact with Patient 03/19/14 1928     Chief Complaint  Patient presents with  . Rash      HPI  Patient presents to complaints. One is his 2 small areas on his arms and keep opening up the bleeding. Also his left arm is painful by his elbow. He works doing home health care. He is under repetitive motion. Denies that he and irritates or him digitalize the area on his arm. However, one is currently scratched open and bleeding.  Past Medical History  Diagnosis Date  . Hypertension   . HIV (human immunodeficiency virus infection)     UNDER CONTROL WITH MEDICATIONS  . Immune deficiency disorder   . Bronchitis     LAST FLARE UP WAS NEW YEARS 2015  . Renal mass, right   . GERD (gastroesophageal reflux disease)     NO MEDS   Past Surgical History  Procedure Laterality Date  . No past surgeries    . Robot assisted laparoscopic nephrectomy Right 08/19/2013    Procedure: ROBOTIC ASSISTED LAPAROSCOPIC RIGHT PARTIAL NEPHRECTOMY;  Surgeon: Ardis Hughs, MD;  Location: [facility];  Service: Urology;  Laterality: Right;   Family History  Problem Relation Age of Onset  . Hypertension Mother   . Hypertension Sister   . Hypertension Brother    History  Substance Use Topics  . Smoking status: Light Tobacco Smoker -- 0.25 packs/day    Types: Cigarettes  . Smokeless tobacco: Never Used  . Alcohol Use: No    Review of Systems  Constitutional: Negative for fever, chills, diaphoresis, appetite change and fatigue.  HENT: Negative for mouth sores, sore throat and trouble swallowing.   Eyes: Negative for visual disturbance.  Respiratory: Negative for cough, chest tightness, shortness of breath and wheezing.   Cardiovascular: Negative for chest pain.  Gastrointestinal: Negative for nausea, vomiting, abdominal pain, diarrhea and abdominal distention.  Endocrine: Negative for polydipsia, polyphagia and polyuria.   Genitourinary: Negative for dysuria, frequency and hematuria.  Musculoskeletal: Positive for arthralgias. Negative for gait problem.  Skin: Negative for color change, pallor and rash.       2 small areas of abnormal skin was left arm.  Neurological: Negative for dizziness, syncope, light-headedness and headaches.  Hematological: Does not bruise/bleed easily.  Psychiatric/Behavioral: Negative for behavioral problems and confusion.      Allergies  Ibuprofen; Penicillins; and Latex  Home Medications   Prior to Admission medications   Medication Sig Start Date End Date Taking? Authorizing Provider  amLODipine (NORVASC) 5 MG tablet Take 1 tablet (5 mg total) by mouth at bedtime. 03/02/14   Angelica Chessman, MD  efavirenz-emtricitabine-tenofovir (ATRIPLA) 600-200-300 MG per tablet Take 1 tablet by mouth at bedtime. 01/28/14   Carlyle Basques, MD  HYDROcodone-acetaminophen (NORCO/VICODIN) 5-325 MG per tablet Take 1-2 tablets by mouth every 6 (six) hours as needed for moderate pain or severe pain. 01/13/14   Noland Fordyce, PA-C  sulfamethoxazole-trimethoprim (BACTRIM DS,SEPTRA DS) 800-160 MG per tablet Take 2 tablets by mouth 2 (two) times daily. 01/12/14   Carlisle Cater, PA-C   Ht 5\' 6"  (1.676 m)  Wt 197 lb (89.359 kg)  BMI 31.81 kg/m2 Physical Exam  Musculoskeletal:       Arms: Skin:       ED Course  Procedures (including critical care time) Labs Review Labs Reviewed - No data to display  Imaging Review No results found.  EKG Interpretation None      MDM   Final diagnoses:  Lateral epicondylitis, left  Dermatitis    Nonspecific area of skin breakdown. Asked him to keep it simply covered and clean. Arm pain consistent with lateral epicondylitis. He will look for repetitive motion in his daily life. Use simple OTC anti-inflammatories.   Tanna Furry, MD 03/19/14 2001

## 2014-03-19 NOTE — ED Notes (Signed)
Pt concerned re 2 open wounds to L forearm x 2 months. Pt states he is now having pain when making fist. Denies n/v/d, denies fever. A & O, NAD

## 2014-04-14 ENCOUNTER — Ambulatory Visit (INDEPENDENT_AMBULATORY_CARE_PROVIDER_SITE_OTHER): Payer: No Typology Code available for payment source | Admitting: *Deleted

## 2014-04-14 DIAGNOSIS — Z23 Encounter for immunization: Secondary | ICD-10-CM

## 2014-04-15 DIAGNOSIS — Z23 Encounter for immunization: Secondary | ICD-10-CM

## 2014-04-29 ENCOUNTER — Ambulatory Visit: Payer: No Typology Code available for payment source | Admitting: Internal Medicine

## 2014-05-13 ENCOUNTER — Ambulatory Visit (INDEPENDENT_AMBULATORY_CARE_PROVIDER_SITE_OTHER): Payer: No Typology Code available for payment source | Admitting: Internal Medicine

## 2014-05-13 ENCOUNTER — Encounter: Payer: Self-pay | Admitting: Internal Medicine

## 2014-05-13 VITALS — BP 133/89 | HR 69 | Temp 98.1°F | Wt 194.0 lb

## 2014-05-13 DIAGNOSIS — B20 Human immunodeficiency virus [HIV] disease: Secondary | ICD-10-CM

## 2014-05-13 NOTE — Progress Notes (Signed)
Patient ID: Drew Cuevas, male   DOB: 1971/03/20, 43 y.o.   MRN: [id number]          Patient Active Problem List   Diagnosis Date Noted  . Renal cell carcinoma of right kidney 08/19/2013    Priority: High  . Essential hypertension, benign 04/28/2008    Priority: Medium  . Human immunodeficiency virus (HIV) disease 08/28/2006    Priority: Medium  . Renal cyst 05/21/2013  . Obesity (BMI 35.0-39.9 without comorbidity) 05/26/2012  . Diarrhea 11/07/2011  . Right knee pain 04/30/2011  . Recurrent boils 06/10/2009  . INTERNAL HEMORRHOIDS WITHOUT MENTION COMP 12/01/2008  . DEPRESSION, ACUTE 12/20/2006    Patient's Medications  New Prescriptions   No medications on file  Previous Medications   AMLODIPINE (NORVASC) 5 MG TABLET    Take 1 tablet (5 mg total) by mouth at bedtime.   EFAVIRENZ-EMTRICITABINE-TENOFOVIR (ATRIPLA) 600-200-300 MG PER TABLET    Take 1 tablet by mouth at bedtime.  Modified Medications   No medications on file  Discontinued Medications   HYDROCODONE-ACETAMINOPHEN (NORCO/VICODIN) 5-325 MG PER TABLET    Take 1-2 tablets by mouth every 6 (six) hours as needed for moderate pain or severe pain.   SULFAMETHOXAZOLE-TRIMETHOPRIM (BACTRIM DS,SEPTRA DS) 800-160 MG PER TABLET    Take 2 tablets by mouth 2 (two) times daily.    Subjective: [place] is in for his routine visit. He has not missed any doses of his Atripla. He has been following up with his urologist with regular scans after partial nephrectomy earlier this year for renal cell carcinoma. He has noted a small bump under his right chin. He is not sure how long it has been there. It does not appear to be enlarging and it is nontender. She's also been having a lot of burning and itching around his rectum and he frequently notes blood on tissue after a bowel movement. He says it does not feel like when he had hemorrhoids years ago. He has not had constipation or pain with bowel movements. Review of Systems: Pertinent items  are noted in HPI.  Past Medical History  Diagnosis Date  . Hypertension   . HIV (human immunodeficiency virus infection)     UNDER CONTROL WITH MEDICATIONS  . Immune deficiency disorder   . Bronchitis     LAST FLARE UP WAS NEW YEARS 2015  . Renal mass, right   . GERD (gastroesophageal reflux disease)     NO MEDS    History  Substance Use Topics  . Smoking status: Light Tobacco Smoker -- 0.10 packs/day    Types: Cigarettes  . Smokeless tobacco: Never Used  . Alcohol Use: No    Family History  Problem Relation Age of Onset  . Hypertension Mother   . Hypertension Sister   . Hypertension Brother     Allergies  Allergen Reactions  . Ibuprofen Swelling and Other (See Comments)    dehydration  . Penicillins Hives  . Latex Rash    Objective: Temp: 98.1 F (36.7 C) (10/15 1539) Temp Source: Oral (10/15 1539) BP: 133/89 mmHg (10/15 1539) Pulse Rate: 69 (10/15 1539) Body mass index is 31.33 kg/(m^2).  General: He is alert, comfortable and in good spirits Oral: No oropharyngeal lesions Skin: Small nodule on the right side of his neck where his beard starts. 2-3 small darkened papules on his left forearm Lungs: Clear Cor: Regular S1 and S2 with no murmurs Abdomen: Soft and nontender He has several small perirectal warts  Lab Results  Lab Results  Component Value Date   WBC 6.4 10/19/2013   HGB 14.4 10/19/2013   HCT 41.4 10/19/2013   MCV 94.7 10/19/2013   PLT 240 10/19/2013    Lab Results  Component Value Date   CREATININE 1.20 02/16/2014   BUN 12 02/16/2014   NA 140 02/16/2014   K 4.5 02/16/2014   CL 109 02/16/2014   CO2 23 02/16/2014    Lab Results  Component Value Date   ALT 28 02/16/2014   AST 22 02/16/2014   ALKPHOS 90 02/16/2014   BILITOT 0.3 02/16/2014    Lab Results  Component Value Date   CHOL 173 11/13/2012   HDL 31* 11/13/2012   LDLCALC 123* 11/13/2012   TRIG 97 11/13/2012   CHOLHDL 5.6 11/13/2012    Lab Results HIV 1 RNA Quant (copies/mL)  Date Value   03/02/2014 <20   09/29/2013 <20   04/28/2013 <20      CD4 T Cell Abs (/uL)  Date Value  09/29/2013 860   04/28/2013 580   11/13/2012 460      Assessment: His HIV infection remains under excellent control.  He has some genital warts and chronic, intermittent hematochezia. I will arrange for my partner, Dr. Johnnye Sima, to perform high-resolution anoscopy and anal Pap.  His skin lesions may be small areas of folliculitis.  Plan: 1. Continue Atripla 2. High-resolution anoscopy and anal Pap 3. Followup here after blood work in [place] months   Michel Bickers, MD [facility] for [place] [phone] pager   [phone] cell 05/13/2014, 4:09 PM

## 2014-07-08 ENCOUNTER — Encounter (HOSPITAL_COMMUNITY): Payer: Self-pay | Admitting: Cardiology

## 2014-07-08 ENCOUNTER — Emergency Department (HOSPITAL_COMMUNITY)
Admission: EM | Admit: 2014-07-08 | Discharge: 2014-07-08 | Disposition: A | Discharge status: Home / Self Care | Payer: Self-pay | Attending: Emergency Medicine | Admitting: Emergency Medicine

## 2014-07-08 DIAGNOSIS — Z720 Tobacco use: Secondary | ICD-10-CM | POA: Insufficient documentation

## 2014-07-08 DIAGNOSIS — I10 Essential (primary) hypertension: Secondary | ICD-10-CM | POA: Insufficient documentation

## 2014-07-08 DIAGNOSIS — Z880 Allergy status to penicillin: Secondary | ICD-10-CM | POA: Insufficient documentation

## 2014-07-08 DIAGNOSIS — Z79899 Other long term (current) drug therapy: Secondary | ICD-10-CM | POA: Insufficient documentation

## 2014-07-08 DIAGNOSIS — Z91040 Latex allergy status: Secondary | ICD-10-CM | POA: Insufficient documentation

## 2014-07-08 DIAGNOSIS — L0201 Cutaneous abscess of face: Secondary | ICD-10-CM | POA: Insufficient documentation

## 2014-07-08 DIAGNOSIS — L0291 Cutaneous abscess, unspecified: Secondary | ICD-10-CM

## 2014-07-08 DIAGNOSIS — Z8719 Personal history of other diseases of the digestive system: Secondary | ICD-10-CM | POA: Insufficient documentation

## 2014-07-08 DIAGNOSIS — Z21 Asymptomatic human immunodeficiency virus [HIV] infection status: Secondary | ICD-10-CM | POA: Insufficient documentation

## 2014-07-08 MED ORDER — CLINDAMYCIN HCL 150 MG PO CAPS: 300.0000 mg | ORAL_CAPSULE | Freq: Three times a day (TID) | ORAL | Status: DC

## 2014-07-08 MED ORDER — LIDOCAINE-EPINEPHRINE (PF) 2 %-1:200000 IJ SOLN
10.0000 mL | Freq: Once | INTRAMUSCULAR | Status: DC
  Filled 2014-07-08: qty 20

## 2014-07-08 NOTE — Discharge Instructions (Signed)

## 2014-07-08 NOTE — ED Provider Notes (Signed)
CSN: [id number]     Arrival date & time 07/08/14  1225 History  This chart was scribed for non-physician practitioner, Lorre Munroe, PA-C, working with Fredia Sorrow, MD, by Jeanell Sparrow, ED Scribe. This patient was seen in room TR06C/TR06C and the patient's care was started at 12:45 PM.   Chief Complaint  Patient presents with  . Abscess   HPI Comments: Last CD4 count was 6 or 700, viral load undetectable per the patient.  The history is provided by the patient. No language interpreter was used.   HPI Comments: Drew Cuevas is a 43 y.o. male who presents to the Emergency Department complaining of a right sided facial abscess that he noticed a 3 days ago. He states that he noticed the abscess in the morning when he woke up. He reports that the pain and swelling has been worsening. He states that he has been using warm compresses without any relief. He reports that he has a hx of HIV. He denies any fever. He states that he has an allergy to ibuprofen and penicillin.   Past Medical History  Diagnosis Date  . Hypertension   . HIV (human immunodeficiency virus infection)     UNDER CONTROL WITH MEDICATIONS  . Immune deficiency disorder   . Bronchitis     LAST FLARE UP WAS NEW YEARS 2015  . Renal mass, right   . GERD (gastroesophageal reflux disease)     NO MEDS   Past Surgical History  Procedure Laterality Date  . No past surgeries    . Robot assisted laparoscopic nephrectomy Right 08/19/2013    Procedure: ROBOTIC ASSISTED LAPAROSCOPIC RIGHT PARTIAL NEPHRECTOMY;  Surgeon: Ardis Hughs, MD;  Location: [facility];  Service: Urology;  Laterality: Right;   Family History  Problem Relation Age of Onset  . Hypertension Mother   . Hypertension Sister   . Hypertension Brother    History  Substance Use Topics  . Smoking status: Light Tobacco Smoker -- 0.10 packs/day    Types: Cigarettes  . Smokeless tobacco: Never Used  . Alcohol Use: No    Review of Systems  Constitutional:  Negative for fever.    Allergies  Ibuprofen; Penicillins; and Latex  Home Medications   Prior to Admission medications   Medication Sig Start Date End Date Taking? Authorizing Provider  amLODipine (NORVASC) 5 MG tablet Take 1 tablet (5 mg total) by mouth at bedtime. 03/02/14   Tresa Garter, MD  efavirenz-emtricitabine-tenofovir (ATRIPLA) 600-200-300 MG per tablet Take 1 tablet by mouth at bedtime. 01/28/14   Carlyle Basques, MD   BP 138/94 mmHg  Pulse 80  Temp(Src) 98.6 F (37 C) (Oral)  Resp 18  Ht 5\' 6"  (1.676 m)  Wt 188 lb (85.276 kg)  BMI 30.36 kg/m2  SpO2 99% Physical Exam  Constitutional: He is oriented to person, place, and time. He appears well-developed. No distress.  HENT:  Head: Normocephalic and atraumatic.  Eyes: Conjunctivae and EOM are normal.  Cardiovascular: Normal rate and regular rhythm.   Pulmonary/Chest: Effort normal. No stridor. No respiratory distress.  Abdominal: He exhibits no distension.  Musculoskeletal: He exhibits no edema.  Neurological: He is alert and oriented to person, place, and time.  Skin: Skin is warm and dry.  1x1 cm pustule to the right cheek. No discharge or drainage. No surrounding erythema. Appears to be a mildly infected hair bump.   Psychiatric: He has a normal mood and affect.  Nursing note and vitals reviewed.   ED Course  Procedures (including critical care time) DIAGNOSTIC STUDIES: Oxygen Saturation is 99% on RA, normal by my interpretation.    COORDINATION OF CARE: 12:49 PM- Pt advised of plan for treatment which includes medication and pt agrees.  Labs Review Labs Reviewed - No data to display  Imaging Review No results found.   EKG Interpretation None     INCISION AND DRAINAGE Performed by: Montine Circle Consent: Verbal consent obtained. Risks and benefits: risks, benefits and alternatives were discussed Type: abscess  Body area: Right cheek  Anesthesia: local infiltration  Aspirated with  18-gauge needle  Local anesthetic: lidocaine 2 % with epinephrine  Anesthetic total: 0.25 ml  Complexity: complex Blunt dissection to break up loculations  Drainage: purulent  Drainage amount: Mild   Packing material: None   Patient tolerance: Patient tolerated the procedure well with no immediate complications.   MDM   Final diagnoses:  Abscess    Patient with very small developing abscess first folliculitis to the right cheek. The abscess was aspirated with an 18-gauge needle, a small amount of purulent discharge was removed, will start the patient on clindamycin, recommend continued warm compresses, discharge to home with primary care follow-up. Return precautions given. I personally performed the services described in this documentation, which was scribed in my presence. The recorded information has been reviewed and is accurate.     Montine Circle, PA-C 07/08/14 [place], MD 07/12/14 [phone]

## 2014-07-08 NOTE — ED Notes (Signed)
Pt reports a boil to the right side of his face that he noticed a couple of days ago.

## 2014-07-08 NOTE — ED Notes (Signed)
PT ambulated with baseline gait; VSS; A&Ox3; no signs of distress; respirations even and unlabored; skin warm and dry; no questions upon discharge.  

## 2014-07-08 NOTE — ED Notes (Signed)
PA at bedside.

## 2014-08-04 ENCOUNTER — Encounter (HOSPITAL_COMMUNITY): Payer: Self-pay | Admitting: Emergency Medicine

## 2014-08-04 ENCOUNTER — Emergency Department (HOSPITAL_COMMUNITY)
Admission: EM | Admit: 2014-08-04 | Discharge: 2014-08-04 | Disposition: A | Discharge status: Home / Self Care | Payer: Self-pay | Attending: Emergency Medicine | Admitting: Emergency Medicine

## 2014-08-04 DIAGNOSIS — Z862 Personal history of diseases of the blood and blood-forming organs and certain disorders involving the immune mechanism: Secondary | ICD-10-CM | POA: Insufficient documentation

## 2014-08-04 DIAGNOSIS — I10 Essential (primary) hypertension: Secondary | ICD-10-CM | POA: Insufficient documentation

## 2014-08-04 DIAGNOSIS — Z8719 Personal history of other diseases of the digestive system: Secondary | ICD-10-CM | POA: Insufficient documentation

## 2014-08-04 DIAGNOSIS — Z720 Tobacco use: Secondary | ICD-10-CM | POA: Insufficient documentation

## 2014-08-04 DIAGNOSIS — Z87448 Personal history of other diseases of urinary system: Secondary | ICD-10-CM | POA: Insufficient documentation

## 2014-08-04 DIAGNOSIS — Z79899 Other long term (current) drug therapy: Secondary | ICD-10-CM | POA: Insufficient documentation

## 2014-08-04 DIAGNOSIS — Z91040 Latex allergy status: Secondary | ICD-10-CM | POA: Insufficient documentation

## 2014-08-04 DIAGNOSIS — Z880 Allergy status to penicillin: Secondary | ICD-10-CM | POA: Insufficient documentation

## 2014-08-04 DIAGNOSIS — Z792 Long term (current) use of antibiotics: Secondary | ICD-10-CM | POA: Insufficient documentation

## 2014-08-04 DIAGNOSIS — L0211 Cutaneous abscess of neck: Secondary | ICD-10-CM | POA: Insufficient documentation

## 2014-08-04 DIAGNOSIS — Z21 Asymptomatic human immunodeficiency virus [HIV] infection status: Secondary | ICD-10-CM | POA: Insufficient documentation

## 2014-08-04 DIAGNOSIS — Z8709 Personal history of other diseases of the respiratory system: Secondary | ICD-10-CM | POA: Insufficient documentation

## 2014-08-04 MED ORDER — CEPHALEXIN 500 MG PO CAPS: 500.0000 mg | ORAL_CAPSULE | Freq: Four times a day (QID) | ORAL | Status: DC

## 2014-08-04 MED ORDER — LIDOCAINE HCL (PF) 1 % IJ SOLN
5.0000 mL | Freq: Once | INTRAMUSCULAR | Status: AC
  Administered 2014-08-04: 5 mL via INTRADERMAL
  Filled 2014-08-04: qty 5

## 2014-08-04 NOTE — Discharge Instructions (Signed)
1. Medications: keflex, usual home medications 2. Treatment: rest, drink plenty of fluids, warm compresses 3. Follow Up: Please followup with your primary doctor in 7 days for discussion of your diagnoses and further evaluation after today's visit; if you do not have a primary care doctor use the resource guide provided to find one; Please return to the ER for worsening pain, continued swelling or signs of increasing infection    Abscess An abscess is an infected area that contains a collection of pus and debris.It can occur in almost any part of the body. An abscess is also known as a furuncle or boil. CAUSES  An abscess occurs when tissue gets infected. This can occur from blockage of oil or sweat glands, infection of hair follicles, or a minor injury to the skin. As the body tries to fight the infection, pus collects in the area and creates pressure under the skin. This pressure causes pain. People with weakened immune systems have difficulty fighting infections and get certain abscesses more often.  SYMPTOMS Usually an abscess develops on the skin and becomes a painful mass that is red, warm, and tender. If the abscess forms under the skin, you may feel a moveable soft area under the skin. Some abscesses break open (rupture) on their own, but most will continue to get worse without care. The infection can spread deeper into the body and eventually into the bloodstream, causing you to feel ill.  DIAGNOSIS  Your caregiver will take your medical history and perform a physical exam. A sample of fluid may also be taken from the abscess to determine what is causing your infection. TREATMENT  Your caregiver may prescribe antibiotic medicines to fight the infection. However, taking antibiotics alone usually does not cure an abscess. Your caregiver may need to make a small cut (incision) in the abscess to drain the pus. In some cases, gauze is packed into the abscess to reduce pain and to continue  draining the area. HOME CARE INSTRUCTIONS   Only take over-the-counter or prescription medicines for pain, discomfort, or fever as directed by your caregiver.  If you were prescribed antibiotics, take them as directed. Finish them even if you start to feel better.  If gauze is used, follow your caregiver's directions for changing the gauze.  To avoid spreading the infection:  Keep your draining abscess covered with a bandage.  Wash your hands well.  Do not share personal care items, towels, or whirlpools with others.  Avoid skin contact with others.  Keep your skin and clothes clean around the abscess.  Keep all follow-up appointments as directed by your caregiver. SEEK MEDICAL CARE IF:   You have increased pain, swelling, redness, fluid drainage, or bleeding.  You have muscle aches, chills, or a general ill feeling.  You have a fever. MAKE SURE YOU:   Understand these instructions.  Will watch your condition.  Will get help right away if you are not doing well or get worse. Document Released: 04/25/2005 Document Revised: 01/15/2012 Document Reviewed: 09/28/2011 [facility] Patient Information 2015 [place], [place]. This information is not intended to replace advice given to you by your health care provider. Make sure you discuss any questions you have with your health care provider.

## 2014-08-04 NOTE — ED Provider Notes (Signed)
CSN: [id number]     Arrival date & time 08/04/14  1842 History   First MD Initiated Contact with Patient 08/04/14 1855     Chief Complaint  Patient presents with  . Abscess     (Consider location/radiation/quality/duration/timing/severity/associated sxs/prior Treatment) The history is provided by the patient and medical records. No language interpreter was used.     Drew Cuevas is a 44 y.o. male  with a hx of HTN, HIV (last CD4 count 31, HIV virus < 20 in Aug 2015), GERD presents to the Emergency Department complaining of gradual, persistent, progressively worsening boil to the left neck onset several days ago. Patient reports he has a history of recurrent abscesses beginning at the age of 46. He reports that he is followed by the infectious disease clinic for his HIV and has been compliant with his medications. He does not know his last CD4 count however it was adequate based on record review. Patient denies fever, chills, headache, neck pain, chest pain, nausea, vomiting. Patient denies difficulty swallowing or sore throat. No aggravating or alleviating factors. No other associated symptoms.     Past Medical History  Diagnosis Date  . Hypertension   . HIV (human immunodeficiency virus infection)     UNDER CONTROL WITH MEDICATIONS  . Immune deficiency disorder   . Bronchitis     LAST FLARE UP WAS NEW YEARS 2015  . Renal mass, right   . GERD (gastroesophageal reflux disease)     NO MEDS   Past Surgical History  Procedure Laterality Date  . No past surgeries    . Robot assisted laparoscopic nephrectomy Right 08/19/2013    Procedure: ROBOTIC ASSISTED LAPAROSCOPIC RIGHT PARTIAL NEPHRECTOMY;  Surgeon: Ardis Hughs, MD;  Location: [facility];  Service: Urology;  Laterality: Right;   Family History  Problem Relation Age of Onset  . Hypertension Mother   . Hypertension Sister   . Hypertension Brother    History  Substance Use Topics  . Smoking status: Light Tobacco Smoker --  0.10 packs/day    Types: Cigarettes  . Smokeless tobacco: Never Used  . Alcohol Use: No    Review of Systems  Constitutional: Negative for fever and chills.  Gastrointestinal: Negative for nausea and vomiting.  Endocrine: Negative for polydipsia, polyphagia and polyuria.  Skin:       Abscess  Allergic/Immunologic: Negative for immunocompromised state.  Hematological: Does not bruise/bleed easily.  Psychiatric/Behavioral: The patient is not nervous/anxious.       Allergies  Ibuprofen; Penicillins; and Latex  Home Medications   Prior to Admission medications   Medication Sig Start Date End Date Taking? Authorizing Provider  amLODipine (NORVASC) 5 MG tablet Take 1 tablet (5 mg total) by mouth at bedtime. 03/02/14   Tresa Garter, MD  cephALEXin (KEFLEX) 500 MG capsule Take 1 capsule (500 mg total) by mouth 4 (four) times daily. 08/04/14   Lashay Osborne, PA-C  clindamycin (CLEOCIN) 150 MG capsule Take 2 capsules (300 mg total) by mouth 3 (three) times daily. May dispense as 150mg  capsules 07/08/14   Montine Circle, PA-C  efavirenz-emtricitabine-tenofovir (ATRIPLA) 600-200-300 MG per tablet Take 1 tablet by mouth at bedtime. 01/28/14   Carlyle Basques, MD   BP 128/88 mmHg  Pulse 82  Temp(Src) 98.3 F (36.8 C) (Oral)  Ht 5\' 6"  (1.676 m)  Wt 188 lb (85.276 kg)  BMI 30.36 kg/m2  SpO2 98% Physical Exam  Constitutional: He is oriented to person, place, and time. He appears well-developed and well-nourished.  No distress.  HENT:  Head: Normocephalic and atraumatic.  Right Ear: Tympanic membrane, external ear and ear canal normal.  Left Ear: Tympanic membrane, external ear and ear canal normal.  Nose: Nose normal. No mucosal edema or rhinorrhea.  Mouth/Throat: Uvula is midline and mucous membranes are normal. Mucous membranes are not dry. No trismus in the jaw. No uvula swelling. No oropharyngeal exudate, posterior oropharyngeal edema, posterior oropharyngeal erythema or  tonsillar abscesses.  Eyes: Conjunctivae are normal. No scleral icterus.  Neck: Normal range of motion, full passive range of motion without pain and phonation normal. No tracheal tenderness, no spinous process tenderness and no muscular tenderness present. No rigidity. No erythema and normal range of motion present. No Brudzinski's sign and no Kernig's sign noted.  Range of motion without pain no No midline or paraspinal tenderness Normal phonation No stridor Handling secretions without difficulty No nuchal rigidity or meningeal signs  Cardiovascular: Normal rate, regular rhythm, normal heart sounds and intact distal pulses.   No murmur heard. Pulses:      Radial pulses are 2+ on the right side, and 2+ on the left side.  Pulmonary/Chest: Effort normal and breath sounds normal. No stridor. No respiratory distress. He has no decreased breath sounds. He has no wheezes.  Equal chest expansion, clear and equal breath sounds without focal wheezes, rhonchi or rales  Abdominal: Soft. He exhibits no distension. There is no tenderness.  Musculoskeletal: Normal range of motion.  Lymphadenopathy:       Head (right side): Submandibular and tonsillar adenopathy present. No submental, no preauricular, no posterior auricular and no occipital adenopathy present.       Head (left side): Submandibular and tonsillar adenopathy present. No submental, no preauricular, no posterior auricular and no occipital adenopathy present.    He has no cervical adenopathy (no lymphadenopathy).       Right cervical: No superficial cervical, no deep cervical and no posterior cervical adenopathy present.      Left cervical: No superficial cervical, no deep cervical and no posterior cervical adenopathy present.  Neurological: He is alert and oriented to person, place, and time.  Alert and oriented Moves all extremities without ataxia  Skin: Skin is warm and dry. He is not diaphoretic. There is erythema.  Small 1.5cm abscess to  the left anterior neck  Psychiatric: He has a normal mood and affect.  Nursing note and vitals reviewed.   ED Course  INCISION AND DRAINAGE Date/Time: 08/04/2014 7:33 PM Performed by: Abigail Butts Authorized by: Abigail Butts Consent: Verbal consent obtained. Risks and benefits: risks, benefits and alternatives were discussed Consent given by: patient Patient understanding: patient states understanding of the procedure being performed Patient consent: the patient's understanding of the procedure matches consent given Procedure consent: procedure consent matches procedure scheduled Relevant documents: relevant documents present and verified Site marked: the operative site was marked Imaging studies: imaging studies available (bedside US) Required items: required blood products, implants, devices, and special equipment available Patient identity confirmed: verbally with patient and arm band Time out: Immediately prior to procedure a "time out" was called to verify the correct patient, procedure, equipment, support staff and site/side marked as required. Type: abscess Body area: head/neck Location details: neck Anesthesia: local infiltration Local anesthetic: lidocaine 1% without epinephrine Anesthetic total: 1.5 ml Patient sedated: no Risk factor: underlying major vessel Scalpel size: 11 Incision type: single straight Complexity: complex Drainage: purulent Drainage amount: moderate Wound treatment: wound left open Packing material: none Patient tolerance: Patient tolerated the procedure  well with no immediate complications   (including critical care time) Labs Review Labs Reviewed - No data to display  Imaging Review No results found.   EKG Interpretation None       .EMERGENCY DEPARTMENT US SOFT TISSUE INTERPRETATION "Study: Limited Ultrasound of the noted body part in comments below"  INDICATIONS: Pain and Soft tissue infection Multiple views of the  body part are obtained with a multi-frequency linear probe  PERFORMED BY:  Myself  IMAGES ARCHIVED?: Yes  SIDE:Left  BODY PART:Neck  FINDINGS: Abcess present and Cellulitis absent  LIMITATIONS:  Body Habitus  INTERPRETATION:  Abcess present and No cellulitis noted  COMMENT:  Small superficial abscess with solid material noted   MDM   Final diagnoses:  Abscess of neck   [name] presents with small abscess.  Superficial on bedside US.  Patient with skin abscess amenable to incision and drainage.  Abscess was not large enough to warrant packing or drain,  wound recheck in 2 days. Encouraged home warm soaks and flushing.  Mild signs of cellulitis is surrounding skin.  Will d/c to home.  Pt d/c home with keflex and 2 day wound check at PCP.   I have personally reviewed patient's vitals, nursing note and any pertinent labs or imaging.  I performed an undressed physical exam.    It has been determined that no acute conditions requiring further emergency intervention are present at this time. The patient/guardian have been advised of the diagnosis and plan. I reviewed all labs and imaging including any potential incidental findings. We have discussed signs and symptoms that warrant return to the ED and they are listed in the discharge instructions.    Vital signs are stable at discharge.   BP 128/88 mmHg  Pulse 82  Temp(Src) 98.3 F (36.8 C) (Oral)  Ht 5\' 6"  (1.676 m)  Wt 188 lb (85.276 kg)  BMI 30.36 kg/m2  SpO2 98%         Abigail Butts, PA-C 08/04/14 [place], MD 08/06/14 1651

## 2014-08-04 NOTE — ED Notes (Signed)
Pt reports that he has an abscess to the L side of his neck, 1in swelling and redness noted to this area.

## 2014-08-08 ENCOUNTER — Encounter (HOSPITAL_COMMUNITY): Payer: Self-pay | Admitting: *Deleted

## 2014-08-08 ENCOUNTER — Emergency Department (HOSPITAL_COMMUNITY)
Admission: EM | Admit: 2014-08-08 | Discharge: 2014-08-08 | Disposition: A | Discharge status: Home / Self Care | Payer: Self-pay | Attending: Emergency Medicine | Admitting: Emergency Medicine

## 2014-08-08 ENCOUNTER — Emergency Department (HOSPITAL_COMMUNITY): Payer: Self-pay

## 2014-08-08 DIAGNOSIS — R0789 Other chest pain: Secondary | ICD-10-CM | POA: Insufficient documentation

## 2014-08-08 DIAGNOSIS — Z792 Long term (current) use of antibiotics: Secondary | ICD-10-CM | POA: Insufficient documentation

## 2014-08-08 DIAGNOSIS — I10 Essential (primary) hypertension: Secondary | ICD-10-CM | POA: Insufficient documentation

## 2014-08-08 DIAGNOSIS — Z87448 Personal history of other diseases of urinary system: Secondary | ICD-10-CM | POA: Insufficient documentation

## 2014-08-08 DIAGNOSIS — Z880 Allergy status to penicillin: Secondary | ICD-10-CM | POA: Insufficient documentation

## 2014-08-08 DIAGNOSIS — Z91040 Latex allergy status: Secondary | ICD-10-CM | POA: Insufficient documentation

## 2014-08-08 DIAGNOSIS — Z8709 Personal history of other diseases of the respiratory system: Secondary | ICD-10-CM | POA: Insufficient documentation

## 2014-08-08 DIAGNOSIS — R079 Chest pain, unspecified: Secondary | ICD-10-CM

## 2014-08-08 DIAGNOSIS — Z8719 Personal history of other diseases of the digestive system: Secondary | ICD-10-CM | POA: Insufficient documentation

## 2014-08-08 DIAGNOSIS — Z79899 Other long term (current) drug therapy: Secondary | ICD-10-CM | POA: Insufficient documentation

## 2014-08-08 DIAGNOSIS — Z720 Tobacco use: Secondary | ICD-10-CM | POA: Insufficient documentation

## 2014-08-08 DIAGNOSIS — Z21 Asymptomatic human immunodeficiency virus [HIV] infection status: Secondary | ICD-10-CM | POA: Insufficient documentation

## 2014-08-08 DIAGNOSIS — Z862 Personal history of diseases of the blood and blood-forming organs and certain disorders involving the immune mechanism: Secondary | ICD-10-CM | POA: Insufficient documentation

## 2014-08-08 LAB — BASIC METABOLIC PANEL
Anion gap: 7 (ref 5–15)
BUN: 13 mg/dL (ref 6–23)
CO2: 26 mmol/L (ref 19–32)
Calcium: 8.9 mg/dL (ref 8.4–10.5)
Chloride: 108 mEq/L (ref 96–112)
Creatinine, Ser: 1.42 mg/dL — ABNORMAL HIGH (ref 0.50–1.35)
GFR calc Af Amer: 69 mL/min — ABNORMAL LOW (ref >90)
GFR calc non Af Amer: 60 mL/min — ABNORMAL LOW (ref >90)
GLUCOSE: 60 mg/dL — AB (ref 70–99)
Potassium: 3.8 mmol/L (ref 3.5–5.1)
Sodium: 141 mmol/L (ref 135–145)

## 2014-08-08 LAB — CBC
HCT: 43.8 % (ref 39.0–52.0)
Hemoglobin: 15.2 g/dL (ref 13.0–17.0)
MCH: 33.6 pg (ref 26.0–34.0)
MCHC: 34.7 g/dL (ref 30.0–36.0)
MCV: 96.7 fL (ref 78.0–100.0)
PLATELETS: 220 10*3/uL (ref 150–400)
RBC: 4.53 MIL/uL (ref 4.22–5.81)
RDW: 13.6 % (ref 11.5–15.5)
WBC: 5.8 10*3/uL (ref 4.0–10.5)

## 2014-08-08 LAB — D-DIMER, QUANTITATIVE: D-Dimer, Quant: 0.3 ug/mL-FEU (ref 0.00–0.48)

## 2014-08-08 LAB — I-STAT TROPONIN, ED: Troponin i, poc: 0 ng/mL (ref 0.00–0.08)

## 2014-08-08 MED ORDER — TRAMADOL HCL 50 MG PO TABS
50.0000 mg | ORAL_TABLET | Freq: Once | ORAL | Status: AC
  Administered 2014-08-08: 50 mg via ORAL
  Filled 2014-08-08: qty 1

## 2014-08-08 MED ORDER — TRAMADOL HCL 50 MG PO TABS: 50.0000 mg | ORAL_TABLET | Freq: Four times a day (QID) | ORAL | Status: DC | PRN

## 2014-08-08 NOTE — ED Provider Notes (Signed)
CSN: [id number]     Arrival date & time 08/08/14  1016 History   First MD Initiated Contact with Patient 08/08/14 1059     Chief Complaint  Patient presents with  . Chest Pain     (Consider location/radiation/quality/duration/timing/severity/associated sxs/prior Treatment) HPI Comments: Patient presents with pain to the right side of his chest. Describes as a sharp pain that started about one hour prior to arrival. Spin constant. It is not exertional. He denies any shortness of breath. He states it's worse when he takes a deep breath or when he moves a certain position or when he turns his head to the right. He denies any nausea vomiting or diaphoresis. He denies any past history of heart disease. He does have a history of hypertension. There is no history of diabetes or hyperlipidemia. There is a family history of heart disease. He was a smoker. He has a history of HIV but says his viral load is undetectable and CD4 counts are normal. He denies any cough congestion or recent illnesses. He denies any leg pain or swelling.   Past Medical History  Diagnosis Date  . Hypertension   . HIV (human immunodeficiency virus infection)     UNDER CONTROL WITH MEDICATIONS  . Immune deficiency disorder   . Bronchitis     LAST FLARE UP WAS NEW YEARS 2015  . Renal mass, right   . GERD (gastroesophageal reflux disease)     NO MEDS   Past Surgical History  Procedure Laterality Date  . No past surgeries    . Robot assisted laparoscopic nephrectomy Right 08/19/2013    Procedure: ROBOTIC ASSISTED LAPAROSCOPIC RIGHT PARTIAL NEPHRECTOMY;  Surgeon: Ardis Hughs, MD;  Location: [facility];  Service: Urology;  Laterality: Right;   Family History  Problem Relation Age of Onset  . Hypertension Mother   . Hypertension Sister   . Hypertension Brother    History  Substance Use Topics  . Smoking status: Light Tobacco Smoker -- 0.10 packs/day    Types: Cigarettes  . Smokeless tobacco: Never Used  . Alcohol  Use: No    Review of Systems  Constitutional: Negative for fever, chills, diaphoresis and fatigue.  HENT: Negative for congestion, rhinorrhea and sneezing.   Eyes: Negative.   Respiratory: Negative for cough, chest tightness and shortness of breath.   Cardiovascular: Positive for chest pain. Negative for leg swelling.  Gastrointestinal: Negative for nausea, vomiting, abdominal pain, diarrhea and blood in stool.  Genitourinary: Negative for frequency, hematuria, flank pain and difficulty urinating.  Musculoskeletal: Negative for back pain and arthralgias.  Skin: Negative for rash.  Neurological: Negative for dizziness, speech difficulty, weakness, numbness and headaches.      Allergies  Ibuprofen; Penicillins; and Latex  Home Medications   Prior to Admission medications   Medication Sig Start Date End Date Taking? Authorizing Provider  amLODipine (NORVASC) 5 MG tablet Take 1 tablet (5 mg total) by mouth at bedtime. 03/02/14  Yes Olugbemiga Essie Christine, MD  cephALEXin (KEFLEX) 500 MG capsule Take 1 capsule (500 mg total) by mouth 4 (four) times daily. Patient taking differently: Take 500 mg by mouth 4 (four) times daily. For 10 days 08/04/14  Yes Hannah Muthersbaugh, PA-C  efavirenz-emtricitabine-tenofovir (ATRIPLA) 600-200-300 MG per tablet Take 1 tablet by mouth at bedtime. 01/28/14  Yes Carlyle Basques, MD  clindamycin (CLEOCIN) 150 MG capsule Take 2 capsules (300 mg total) by mouth 3 (three) times daily. May dispense as 150mg  capsules Patient not taking: Reported on 08/08/2014 07/08/14  Montine Circle, PA-C  traMADol (ULTRAM) 50 MG tablet Take 1 tablet (50 mg total) by mouth every 6 (six) hours as needed. 08/08/14   Malvin Johns, MD   BP 117/78 mmHg  Pulse 70  Temp(Src) 98.3 F (36.8 C) (Oral)  Resp 19  Ht 5\' 6"  (1.676 m)  Wt 188 lb (85.276 kg)  BMI 30.36 kg/m2  SpO2 96% Physical Exam  Constitutional: He is oriented to person, place, and time. He appears well-developed and  well-nourished.  HENT:  Head: Normocephalic and atraumatic.  Eyes: Pupils are equal, round, and reactive to light.  Neck: Normal range of motion. Neck supple.  Cardiovascular: Normal rate, regular rhythm and normal heart sounds.   Pulmonary/Chest: Effort normal and breath sounds normal. No respiratory distress. He has no wheezes. He has no rales. He exhibits tenderness (mild TTP right chest wall).  Abdominal: Soft. Bowel sounds are normal. There is no tenderness. There is no rebound and no guarding.  Musculoskeletal: Normal range of motion. He exhibits no edema.  No calf tenderness  Lymphadenopathy:    He has no cervical adenopathy.  Neurological: He is alert and oriented to person, place, and time.  Skin: Skin is warm and dry. No rash noted.  Psychiatric: He has a normal mood and affect.    ED Course  Procedures (including critical care time) Labs Review Labs Reviewed  BASIC METABOLIC PANEL - Abnormal; Notable for the following:    Glucose, Bld 60 (*)    Creatinine, Ser 1.42 (*)    GFR calc non Af Amer 60 (*)    GFR calc Af Amer 69 (*)    All other components within normal limits  CBC  D-DIMER, QUANTITATIVE  I-STAT TROPOININ, ED    Imaging Review Dg Chest Port 1 View  08/08/2014   CLINICAL DATA:  44 year old male with mid and right-sided chest pain today.  EXAM: PORTABLE CHEST - 1 VIEW  COMPARISON:  Chest x-ray 02/18/2014.  FINDINGS: Lung volumes are normal. No consolidative airspace disease. No pleural effusions. No pneumothorax. No pulmonary nodule or mass noted. Pulmonary vasculature and the cardiomediastinal silhouette are within normal limits.  IMPRESSION: No radiographic evidence of acute cardiopulmonary disease.   Electronically Signed   By: Vinnie Langton M.D.   On: 08/08/2014 10:58     EKG Interpretation None      Date: 08/08/2014  Rate: 71  Rhythm: normal sinus rhythm  QRS Axis: normal  Intervals: normal  ST/T Wave abnormalities: nonspecific ST/T changes   Conduction Disutrbances:first-degree A-V block   Narrative Interpretation:   Old EKG Reviewed: unchanged Cannot be confirmed in MUSE due to mismatch   MDM   Final diagnoses:  Chest pain, unspecified chest pain type    There is no evidence of pneumothorax. Patient's EKG is unchanged from his prior EKG. His troponin is negative. His symptoms do not sound cardiac in nature. There is no suggestions of pulmonary embolus. His symptoms sound like pleurisy. There is no other suggestions of other etiology such as thoracic dissection. He was instructed to have close follow-up with his primary care physician. He was advised to return here if his symptoms worsen in anyway.    Malvin Johns, MD 08/08/14 1344

## 2014-08-08 NOTE — ED Notes (Addendum)
Pt reports sudden onset of chest pain 1 hr PTA . Central chest radiating to R chest. No change with exertion. Sob with exertion. Denies n/v, dizziness, diaphoresis. Sts pain worsens when he turns his head to the right.

## 2014-08-08 NOTE — Discharge Instructions (Signed)

## 2014-08-13 ENCOUNTER — Ambulatory Visit (INDEPENDENT_AMBULATORY_CARE_PROVIDER_SITE_OTHER): Payer: Self-pay | Admitting: Infectious Diseases

## 2014-08-13 ENCOUNTER — Encounter: Payer: Self-pay | Admitting: Infectious Diseases

## 2014-08-13 ENCOUNTER — Other Ambulatory Visit (HOSPITAL_COMMUNITY)
Admission: RE | Admit: 2014-08-13 | Discharge: 2014-08-13 | Disposition: A | Discharge status: Home / Self Care | Payer: Self-pay | Source: Ambulatory Visit | Attending: Infectious Diseases | Admitting: Infectious Diseases

## 2014-08-13 VITALS — BP 132/89 | HR 82 | Temp 98.2°F | Wt 192.0 lb

## 2014-08-13 DIAGNOSIS — A630 Anogenital (venereal) warts: Secondary | ICD-10-CM

## 2014-08-13 DIAGNOSIS — K648 Other hemorrhoids: Secondary | ICD-10-CM | POA: Insufficient documentation

## 2014-08-13 DIAGNOSIS — K645 Perianal venous thrombosis: Secondary | ICD-10-CM

## 2014-08-13 DIAGNOSIS — L0293 Carbuncle, unspecified: Secondary | ICD-10-CM | POA: Insufficient documentation

## 2014-08-13 NOTE — Progress Notes (Signed)
Patient ID: Drew Cuevas, male   DOB: 09-11-1970, 44 y.o.   MRN: [id number]     44 yo M with hx of HIV+ since 2008, on atripla. Also hx rectal pruritis.  Has occas blood in stool. Denies previous rectal warts.    PRE-OPERATIVE DIAGNOSIS:  Anal condyloma, HRA with bx  POST-OPERATIVE DIAGNOSIS:  Anal condyloma, HRA with bx  PROCEDURE:    SURGEON:  Laketha Leopard  ASSISTANT: [place]  ANESTHESIA:   local  EBL: < 10 cc   SPECIMEN:  Source of Specimen:  rectal. biopsy at ~ 4cm, small raised area.   DISPOSITION OF SPECIMEN:  PATHOLOGY  COUNTS:  YES  PLAN OF CARE: home  PATIENT DISPOSITION:  home  INDICATION: anal pruritis  OR FINDINGS:  1) small raised area (< 1 cm) 2) internal, thrombosed hemorrhoid.  3) external, non-thrombosed hemorrhoid  DESCRIPTION: The patient was identified in the waiting area and taken to the exam room where they were laid on the table in the lateral decubitus position.  The patient was then prepped and draped in the usual fashion. A surgical timeout was performed indicating the correct patient, procedure, positioning and preoperative antibioitics.   After this was completed, a sponge was soaked in 2% acetic acid was placed over the perianal region. This was allowed to soak for 1 minute. The sponge was removed and the perianal region was evaluated with a colposcope.  There was an external, tender, non-throbosed external hemorrhoid.  The internal anal canal was evaluated via anoscopy with an anoscope.  There was 1 lesion (< 1 cm) at ~ 4cm, 3 o'clock that was raised and acetowhite. This was biopsied.  After this was completed, hemostasis was achieved with gauze.  A thrombosed internal hemorroid was seen at 6 o'clock just inside the anal verge   PLAN:  Will have pt seen by surgery for resection of thrombosed hemorrhoid.  Will await path He will f/u with PCP

## 2014-08-16 ENCOUNTER — Other Ambulatory Visit: Payer: Self-pay | Admitting: *Deleted

## 2014-08-16 DIAGNOSIS — B20 Human immunodeficiency virus [HIV] disease: Secondary | ICD-10-CM

## 2014-08-16 MED ORDER — EFAVIRENZ-EMTRICITAB-TENOFOVIR 600-200-300 MG PO TABS
1.0000 | ORAL_TABLET | Freq: Every day | ORAL | Status: DC
Start: 2014-08-16 — End: 2015-02-03

## 2014-08-17 ENCOUNTER — Telehealth: Payer: Self-pay | Admitting: *Deleted

## 2014-08-17 NOTE — Telephone Encounter (Signed)
Patient notified of appointment for general surgeon at [facility] outpatient clinic for 09/06/14 at 1:00 pm. [facility], 4th floor. They will be mailing a new patient packet with directions. Office note and demo faxed to [phone]. Myrtis Hopping

## 2014-09-02 ENCOUNTER — Encounter (HOSPITAL_COMMUNITY): Payer: Self-pay | Admitting: Emergency Medicine

## 2014-09-02 ENCOUNTER — Telehealth: Payer: Self-pay | Admitting: *Deleted

## 2014-09-02 ENCOUNTER — Emergency Department (HOSPITAL_COMMUNITY): Payer: Self-pay

## 2014-09-02 ENCOUNTER — Emergency Department (HOSPITAL_COMMUNITY)
Admission: EM | Admit: 2014-09-02 | Discharge: 2014-09-02 | Disposition: A | Discharge status: Home / Self Care | Payer: Self-pay | Attending: Emergency Medicine | Admitting: Emergency Medicine

## 2014-09-02 DIAGNOSIS — Z792 Long term (current) use of antibiotics: Secondary | ICD-10-CM | POA: Insufficient documentation

## 2014-09-02 DIAGNOSIS — R0981 Nasal congestion: Secondary | ICD-10-CM

## 2014-09-02 DIAGNOSIS — J029 Acute pharyngitis, unspecified: Secondary | ICD-10-CM | POA: Insufficient documentation

## 2014-09-02 DIAGNOSIS — Z21 Asymptomatic human immunodeficiency virus [HIV] infection status: Secondary | ICD-10-CM | POA: Insufficient documentation

## 2014-09-02 DIAGNOSIS — R05 Cough: Secondary | ICD-10-CM

## 2014-09-02 DIAGNOSIS — I10 Essential (primary) hypertension: Secondary | ICD-10-CM | POA: Insufficient documentation

## 2014-09-02 DIAGNOSIS — Z880 Allergy status to penicillin: Secondary | ICD-10-CM | POA: Insufficient documentation

## 2014-09-02 DIAGNOSIS — Z87448 Personal history of other diseases of urinary system: Secondary | ICD-10-CM | POA: Insufficient documentation

## 2014-09-02 DIAGNOSIS — Z720 Tobacco use: Secondary | ICD-10-CM | POA: Insufficient documentation

## 2014-09-02 DIAGNOSIS — R059 Cough, unspecified: Secondary | ICD-10-CM

## 2014-09-02 DIAGNOSIS — Z91040 Latex allergy status: Secondary | ICD-10-CM | POA: Insufficient documentation

## 2014-09-02 DIAGNOSIS — Z79899 Other long term (current) drug therapy: Secondary | ICD-10-CM | POA: Insufficient documentation

## 2014-09-02 DIAGNOSIS — Z8719 Personal history of other diseases of the digestive system: Secondary | ICD-10-CM | POA: Insufficient documentation

## 2014-09-02 MED ORDER — CEFDINIR 300 MG PO CAPS: 300.0000 mg | ORAL_CAPSULE | Freq: Two times a day (BID) | ORAL | Status: DC

## 2014-09-02 MED ORDER — CLINDAMYCIN HCL 150 MG PO CAPS: 300.0000 mg | ORAL_CAPSULE | Freq: Three times a day (TID) | ORAL | Status: DC

## 2014-09-02 NOTE — Discharge Instructions (Signed)
Take the prescribed medication as directed. °Follow-up with your primary care physician. °Return to the ED for new or worsening symptoms. ° °

## 2014-09-02 NOTE — ED Notes (Signed)
Pt c/o nasal congestion, sore throat, and headache x 3 days.

## 2014-09-02 NOTE — ED Provider Notes (Signed)
CSN: [id number]     Arrival date & time 09/02/14  1059 History   First MD Initiated Contact with Patient 09/02/14 1115     Chief Complaint  Patient presents with  . Nasal Congestion  . Sore Throat  . Headache     (Consider location/radiation/quality/duration/timing/severity/associated sxs/prior Treatment) Patient is a 44 y.o. male presenting with pharyngitis and headaches. The history is provided by the patient and medical records.  Sore Throat Associated symptoms include headaches and a sore throat. Pertinent negatives include no fever, myalgias, nausea or vomiting.  Headache Associated symptoms: drainage and sore throat   Associated symptoms: no diarrhea, no fever, no myalgias, no nausea, no sinus pressure and no vomiting     44 y.o. Male with history of HIV and recurrent sinus infections presents with a 3 day history of cough, headache, and pharyngitis with worsening symptoms today. Patient used OTC cold medicine without relief. Cough is productive with a thick clear/white sputum. Patient is being followed Dr. Megan Salon for HIV and is stable on medication. Denies fever, chills, rhinorrhea, otalgia, chest pain, shortness of breath, nausea, vomiting, diarrhea, dysuria, and urinary frequency. Patient is current on flu vaccination and is unsure about recent sick contacts as he works as a Programmer, applications. Patient is a daily smoker. Vital signs stable.    Past Medical History  Diagnosis Date  . Hypertension   . HIV (human immunodeficiency virus infection)     UNDER CONTROL WITH MEDICATIONS  . Immune deficiency disorder   . Bronchitis     LAST FLARE UP WAS NEW YEARS 2015  . Renal mass, right   . GERD (gastroesophageal reflux disease)     NO MEDS   Past Surgical History  Procedure Laterality Date  . No past surgeries    . Robot assisted laparoscopic nephrectomy Right 08/19/2013    Procedure: ROBOTIC ASSISTED LAPAROSCOPIC RIGHT PARTIAL NEPHRECTOMY;  Surgeon: Ardis Hughs, MD;   Location: [facility];  Service: Urology;  Laterality: Right;   Family History  Problem Relation Age of Onset  . Hypertension Mother   . Hypertension Sister   . Hypertension Brother    History  Substance Use Topics  . Smoking status: Light Tobacco Smoker -- 0.10 packs/day    Types: Cigarettes  . Smokeless tobacco: Never Used  . Alcohol Use: No    Review of Systems  Constitutional: Negative for fever.  HENT: Positive for postnasal drip and sore throat. Negative for rhinorrhea, sinus pressure and sneezing.   Gastrointestinal: Negative for nausea, vomiting and diarrhea.  Musculoskeletal: Negative for myalgias.  Neurological: Positive for headaches.  All other systems reviewed and are negative.     Allergies  Ibuprofen; Penicillins; and Latex  Home Medications   Prior to Admission medications   Medication Sig Start Date End Date Taking? Authorizing Provider  amLODipine (NORVASC) 5 MG tablet Take 1 tablet (5 mg total) by mouth at bedtime. 03/02/14   Tresa Garter, MD  cephALEXin (KEFLEX) 500 MG capsule Take 1 capsule (500 mg total) by mouth 4 (four) times daily. Patient not taking: Reported on 08/13/2014 08/04/14   Jarrett Soho Muthersbaugh, PA-C  clindamycin (CLEOCIN) 150 MG capsule Take 2 capsules (300 mg total) by mouth 3 (three) times daily. May dispense as 150mg  capsules 07/08/14   Montine Circle, PA-C  efavirenz-emtricitabine-tenofovir (ATRIPLA) 600-200-300 MG per tablet Take 1 tablet by mouth at bedtime. 08/16/14   Truman Hayward, MD  traMADol (ULTRAM) 50 MG tablet Take 1 tablet (50 mg total) by  mouth every 6 (six) hours as needed. Patient not taking: Reported on 08/13/2014 08/08/14   Malvin Johns, MD   BP 129/75 mmHg  Pulse 100  Temp(Src) 98.7 F (37.1 C) (Oral)  Resp 18  SpO2 99% Physical Exam  Constitutional: He is oriented to person, place, and time. He appears well-developed and well-nourished.  HENT:  Head: Normocephalic and atraumatic.  Mouth/Throat: Uvula is  midline. Posterior oropharyngeal erythema present. No oropharyngeal exudate, posterior oropharyngeal edema or tonsillar abscesses.  Eyes: Conjunctivae and EOM are normal. Pupils are equal, round, and reactive to light.  Neck: Normal range of motion and full passive range of motion without pain. Neck supple. No rigidity.  No meningismus  Cardiovascular: Normal rate, regular rhythm and normal heart sounds.   Pulmonary/Chest: Effort normal and breath sounds normal. No respiratory distress. He has no wheezes. He has no rhonchi.  Course breath sounds bilaterally without wheezes or rhonchi, respirations unlabored, no distress, speaking in full complete sentences without difficulty  Abdominal: Soft. Bowel sounds are normal.  Musculoskeletal: Normal range of motion.  Neurological: He is alert and oriented to person, place, and time.  AAOx3, answering questions appropriately; equal strength UE and LE bilaterally; CN grossly intact; moves all extremities appropriately without ataxia; no focal neuro deficits or facial asymmetry appreciated  Skin: Skin is warm and dry.  Psychiatric: He has a normal mood and affect.  Nursing note and vitals reviewed.   ED Course  Procedures (including critical care time) Labs Review Labs Reviewed - No data to display  Imaging Review Dg Chest 2 View  09/02/2014   CLINICAL DATA:  Cough  EXAM: CHEST  2 VIEW  COMPARISON:  08/08/2014  FINDINGS: Normal heart size and mediastinal contours. No acute infiltrate or edema. No effusion or pneumothorax. No acute osseous findings.  IMPRESSION: Negative chest.   Electronically Signed   By: Jorje Guild M.D.   On: 09/02/2014 12:15     EKG Interpretation None      MDM   Final diagnoses:  Cough  Nasal congestion  Sore throat   44 year old male with URI type symptoms for the past 3 days.  No chest pain or SOB.  He is currently afebrile and nontoxic in appearance. He has coarse breath sounds bilaterally but no wheezes or  rhonchi. His vital signs are stable on room air. Headache without nuchal rigidity to suggest meningitis. Neurologic exam is nonfocal. Patient is immune compromised, no recent CD4 count. Chest x-ray was obtained which is negative for acute findings. Patient will be started on antibiotics for coverage of URI. He will follow with his primary care physician.  Discussed plan with patient, he/she acknowledged understanding and agreed with plan of care.  Return precautions given for new or worsening symptoms.  Larene Pickett, PA-C 09/02/14 [place] Alvino Chapel, MD 09/06/14 [phone]

## 2014-09-02 NOTE — Telephone Encounter (Signed)
Pt stated was at the ER yesterday and need to get medication change Advised to make appointment for ER F/U for any medicine change  Pt stated has another PCP and will call them

## 2014-09-29 ENCOUNTER — Other Ambulatory Visit: Payer: Self-pay | Admitting: Internal Medicine

## 2014-10-01 ENCOUNTER — Emergency Department (HOSPITAL_COMMUNITY): Payer: Self-pay

## 2014-10-01 ENCOUNTER — Encounter (HOSPITAL_COMMUNITY): Payer: Self-pay | Admitting: Emergency Medicine

## 2014-10-01 ENCOUNTER — Emergency Department (HOSPITAL_COMMUNITY)
Admission: EM | Admit: 2014-10-01 | Discharge: 2014-10-02 | Disposition: A | Discharge status: Home / Self Care | Payer: Self-pay | Attending: Emergency Medicine | Admitting: Emergency Medicine

## 2014-10-01 DIAGNOSIS — S6991XA Unspecified injury of right wrist, hand and finger(s), initial encounter: Secondary | ICD-10-CM | POA: Insufficient documentation

## 2014-10-01 DIAGNOSIS — Z862 Personal history of diseases of the blood and blood-forming organs and certain disorders involving the immune mechanism: Secondary | ICD-10-CM | POA: Insufficient documentation

## 2014-10-01 DIAGNOSIS — W108XXA Fall (on) (from) other stairs and steps, initial encounter: Secondary | ICD-10-CM | POA: Insufficient documentation

## 2014-10-01 DIAGNOSIS — I10 Essential (primary) hypertension: Secondary | ICD-10-CM | POA: Insufficient documentation

## 2014-10-01 DIAGNOSIS — Z87448 Personal history of other diseases of urinary system: Secondary | ICD-10-CM | POA: Insufficient documentation

## 2014-10-01 DIAGNOSIS — Y9389 Activity, other specified: Secondary | ICD-10-CM | POA: Insufficient documentation

## 2014-10-01 DIAGNOSIS — Z8709 Personal history of other diseases of the respiratory system: Secondary | ICD-10-CM | POA: Insufficient documentation

## 2014-10-01 DIAGNOSIS — S0003XA Contusion of scalp, initial encounter: Secondary | ICD-10-CM

## 2014-10-01 DIAGNOSIS — Y929 Unspecified place or not applicable: Secondary | ICD-10-CM | POA: Insufficient documentation

## 2014-10-01 DIAGNOSIS — S199XXA Unspecified injury of neck, initial encounter: Secondary | ICD-10-CM | POA: Insufficient documentation

## 2014-10-01 DIAGNOSIS — Z8719 Personal history of other diseases of the digestive system: Secondary | ICD-10-CM | POA: Insufficient documentation

## 2014-10-01 DIAGNOSIS — W19XXXA Unspecified fall, initial encounter: Secondary | ICD-10-CM

## 2014-10-01 DIAGNOSIS — Z792 Long term (current) use of antibiotics: Secondary | ICD-10-CM | POA: Insufficient documentation

## 2014-10-01 DIAGNOSIS — Z21 Asymptomatic human immunodeficiency virus [HIV] infection status: Secondary | ICD-10-CM | POA: Insufficient documentation

## 2014-10-01 DIAGNOSIS — Y998 Other external cause status: Secondary | ICD-10-CM | POA: Insufficient documentation

## 2014-10-01 DIAGNOSIS — Z720 Tobacco use: Secondary | ICD-10-CM | POA: Insufficient documentation

## 2014-10-01 DIAGNOSIS — Z859 Personal history of malignant neoplasm, unspecified: Secondary | ICD-10-CM | POA: Insufficient documentation

## 2014-10-01 HISTORY — DX: Malignant (primary) neoplasm, unspecified: C80.1

## 2014-10-01 MED ORDER — ACETAMINOPHEN 325 MG PO TABS
650.0000 mg | ORAL_TABLET | Freq: Once | ORAL | Status: AC
  Administered 2014-10-01: 650 mg via ORAL
  Filled 2014-10-01: qty 2

## 2014-10-01 NOTE — ED Provider Notes (Signed)
CSN: [id number]     Arrival date & time 10/01/14  2123 History   First MD Initiated Contact with Patient 10/01/14 2129     Chief Complaint  Patient presents with  . Fall     (Consider location/radiation/quality/duration/timing/severity/associated sxs/prior Treatment) Patient is a 44 y.o. male presenting with fall. The history is provided by the patient.  Fall This is a new problem. The current episode started less than 1 hour ago. Episode frequency: once. The problem has been resolved. Pertinent negatives include no chest pain, no abdominal pain, no headaches and no shortness of breath. Nothing aggravates the symptoms. Nothing relieves the symptoms. He has tried nothing for the symptoms. The treatment provided no relief.    Past Medical History  Diagnosis Date  . Hypertension   . HIV (human immunodeficiency virus infection)     UNDER CONTROL WITH MEDICATIONS  . Immune deficiency disorder   . Bronchitis     LAST FLARE UP WAS NEW YEARS 2015  . Renal mass, right   . GERD (gastroesophageal reflux disease)     NO MEDS  . Cancer    Past Surgical History  Procedure Laterality Date  . No past surgeries    . Robot assisted laparoscopic nephrectomy Right 08/19/2013    Procedure: ROBOTIC ASSISTED LAPAROSCOPIC RIGHT PARTIAL NEPHRECTOMY;  Surgeon: Ardis Hughs, MD;  Location: [facility];  Service: Urology;  Laterality: Right;   Family History  Problem Relation Age of Onset  . Hypertension Mother   . Hypertension Sister   . Hypertension Brother    History  Substance Use Topics  . Smoking status: Light Tobacco Smoker -- 0.10 packs/day    Types: Cigarettes  . Smokeless tobacco: Never Used  . Alcohol Use: No    Review of Systems  Constitutional: Negative for fever.  HENT: Negative for drooling and rhinorrhea.   Eyes: Negative for pain.  Respiratory: Negative for cough and shortness of breath.   Cardiovascular: Negative for chest pain and leg swelling.  Gastrointestinal: Negative  for nausea, vomiting, abdominal pain and diarrhea.  Genitourinary: Negative for dysuria and hematuria.  Musculoskeletal: Negative for gait problem and neck pain.  Skin: Negative for color change.  Neurological: Negative for numbness and headaches.  Hematological: Negative for adenopathy.  Psychiatric/Behavioral: Negative for behavioral problems.  All other systems reviewed and are negative.     Allergies  Ibuprofen; Penicillins; and Latex  Home Medications   Prior to Admission medications   Medication Sig Start Date End Date Taking? Authorizing Provider  amLODipine (NORVASC) 5 MG tablet Take 1 tablet (5 mg total) by mouth at bedtime. 03/02/14   Tresa Garter, MD  ATRIPLA 600-200-300 MG per tablet TAKE 1 TABLET BY MOUTH EVERY NIGHT AT BEDTIME 09/29/14   Michel Bickers, MD  cefdinir (OMNICEF) 300 MG capsule Take 1 capsule (300 mg total) by mouth 2 (two) times daily. 09/02/14   Larene Pickett, PA-C  cephALEXin (KEFLEX) 500 MG capsule Take 1 capsule (500 mg total) by mouth 4 (four) times daily. Patient not taking: Reported on 08/13/2014 08/04/14   Jarrett Soho Muthersbaugh, PA-C  efavirenz-emtricitabine-tenofovir (ATRIPLA) 600-200-300 MG per tablet Take 1 tablet by mouth at bedtime. 08/16/14   Truman Hayward, MD  traMADol (ULTRAM) 50 MG tablet Take 1 tablet (50 mg total) by mouth every 6 (six) hours as needed. Patient not taking: Reported on 08/13/2014 08/08/14   Malvin Johns, MD   BP 125/77 mmHg  Pulse 86  Temp(Src) 98.4 F (36.9 C) (Oral)  Resp 18  Ht 5\' 5"  (1.651 m)  Wt 194 lb (87.998 kg)  BMI 32.28 kg/m2  SpO2 100% Physical Exam  Constitutional: He is oriented to person, place, and time. He appears well-developed and well-nourished.  HENT:  Right Ear: External ear normal.  Left Ear: External ear normal.  Nose: Nose normal.  Mouth/Throat: Oropharynx is clear and moist. No oropharyngeal exudate.  Small hematoma to the right frontotemporal area.  Eyes: Conjunctivae and EOM are  normal. Pupils are equal, round, and reactive to light.  Neck: Normal range of motion. Neck supple.  No vertebral tenderness to palpation.  Mild left paracervical tenderness.  Cardiovascular: Normal rate, regular rhythm, normal heart sounds and intact distal pulses.  Exam reveals no gallop and no friction rub.   No murmur heard. Pulmonary/Chest: Effort normal and breath sounds normal. No respiratory distress. He has no wheezes.  Abdominal: Soft. Bowel sounds are normal. He exhibits no distension. There is no tenderness. There is no rebound and no guarding.  Musculoskeletal: Normal range of motion. He exhibits no edema or tenderness.  Mild tenderness to palpation of the thenar eminence of the right hand. Otherwise normal strength and sensation in all extremities.  Normal range of motion of the hips bilaterally without pain.  Neurological: He is alert and oriented to person, place, and time.  alert, oriented x3 speech: normal in context and clarity memory: intact grossly cranial nerves II-XII: intact motor strength: full proximally and distally no involuntary movements or tremors sensation: intact to light touch diffusely  Cerebellar: normal coordination gait: normal  Skin: Skin is warm and dry.  Psychiatric: He has a normal mood and affect. His behavior is normal.  Nursing note and vitals reviewed.   ED Course  Procedures (including critical care time) Labs Review Labs Reviewed - No data to display  Imaging Review Ct Head Wo Contrast  10/01/2014   CLINICAL DATA:  Status post fall down 10 stairs. Hit head on door. Headache and right scalp hematoma. Initial encounter.  EXAM: CT HEAD WITHOUT CONTRAST  TECHNIQUE: Contiguous axial images were obtained from the base of the skull through the vertex without intravenous contrast.  COMPARISON:  CT of the head performed 05/03/2010  FINDINGS: There is no evidence of acute infarction, mass lesion, or intra- or extra-axial hemorrhage on CT.  The  posterior fossa, including the cerebellum, brainstem and fourth ventricle, is within normal limits. The third and lateral ventricles, and basal ganglia are unremarkable in appearance. The cerebral hemispheres are symmetric in appearance, with normal gray-white differentiation. No mass effect or midline shift is seen.  There is no evidence of fracture; visualized osseous structures are unremarkable in appearance. The visualized portions of the orbits are within normal limits. The paranasal sinuses and mastoid air cells are well-aerated. No significant soft tissue abnormalities are seen.  IMPRESSION: No evidence of traumatic intracranial injury or fracture.   Electronically Signed   By: Garald Balding M.D.   On: 10/01/2014 23:15   Dg Hand Complete Right  10/01/2014   CLINICAL DATA:  Right hand injury from falling down stairs. Pain at the base of the right thumb. Initial encounter.  EXAM: RIGHT HAND - COMPLETE 3+ VIEW  COMPARISON:  None.  FINDINGS: There is no evidence of fracture or dislocation. The joint spaces are preserved. The carpal rows are intact, and demonstrate normal alignment. The right thumb is unremarkable in appearance. The soft tissues are unremarkable in appearance.  IMPRESSION: No evidence of fracture or dislocation.   Electronically  Signed   By: Garald Balding M.D.   On: 10/01/2014 22:54     EKG Interpretation None      MDM   Final diagnoses:  Fall  Hematoma of scalp, initial encounter    9:51 PM 44 y.o. male with history of HIV, hypertension who presents with a mechanical fall down 10-15 carpeted stairs which occurred prior to arrival while he was having a playful water fight with other family members. He states that he tumbled down the stairs and believes he hit his head against the door. No obvious loss of consciousness per the family. He has a small hematoma to the right frontotemporal area. No vertebral tenderness. We'll get screening imaging and he requests Tylenol only for  pain.  12:18 AM: I interpreted/reviewed the labs and/or imaging which were non-contributory.  Pt continues to appear well. Educated about possibly concussion. I have discussed the diagnosis/risks/treatment options with the patient and believe the pt to be eligible for discharge home to follow-up with his pcp as needed. We also discussed returning to the ED immediately if new or worsening sx occur. We discussed the sx which are most concerning (e.g., worsening HA, ataxia, vomiting) that necessitate immediate return. Medications administered to the patient during their visit and any new prescriptions provided to the patient are listed below.  Medications given during this visit Medications  acetaminophen (TYLENOL) tablet 650 mg (650 mg Oral Given 10/01/14 2204)    New Prescriptions   No medications on file     Pamella Pert, MD 10/02/14 [phone]

## 2014-10-01 NOTE — ED Notes (Signed)
Pt here from EMS with witnessed fall down approx 10 stairs. Pt was having a water fight with nephew when he slipped and fell. Pt states that he hit head on a door, denies LOC, but endorses HA 10/10. Pt also reporting left sided neck pain and right hand pain. Pt is HIV positive, bu family does not know. NAD noted at this time.

## 2014-11-02 ENCOUNTER — Other Ambulatory Visit: Payer: Self-pay | Admitting: Internal Medicine

## 2014-11-02 ENCOUNTER — Other Ambulatory Visit (INDEPENDENT_AMBULATORY_CARE_PROVIDER_SITE_OTHER): Payer: Self-pay

## 2014-11-02 DIAGNOSIS — B20 Human immunodeficiency virus [HIV] disease: Secondary | ICD-10-CM

## 2014-11-02 DIAGNOSIS — Z113 Encounter for screening for infections with a predominantly sexual mode of transmission: Secondary | ICD-10-CM

## 2014-11-02 DIAGNOSIS — Z79899 Other long term (current) drug therapy: Secondary | ICD-10-CM

## 2014-11-02 LAB — CBC
HEMATOCRIT: 44.8 % (ref 39.0–52.0)
Hemoglobin: 15.4 g/dL (ref 13.0–17.0)
MCH: 32.6 pg (ref 26.0–34.0)
MCHC: 34.4 g/dL (ref 30.0–36.0)
MCV: 94.7 fL (ref 78.0–100.0)
MPV: 10.7 fL (ref 8.6–12.4)
Platelets: 210 10*3/uL (ref 150–400)
RBC: 4.73 MIL/uL (ref 4.22–5.81)
RDW: 14.3 % (ref 11.5–15.5)
WBC: 5.8 10*3/uL (ref 4.0–10.5)

## 2014-11-03 LAB — RPR

## 2014-11-03 LAB — LIPID PANEL
CHOLESTEROL: 238 mg/dL — AB (ref 0–200)
HDL: 34 mg/dL — AB (ref >=40)
LDL CALC: 186 mg/dL — AB (ref 0–99)
TRIGLYCERIDES: 92 mg/dL (ref <150)
Total CHOL/HDL Ratio: 7 Ratio
VLDL: 18 mg/dL (ref 0–40)

## 2014-11-03 LAB — COMPREHENSIVE METABOLIC PANEL
ALT: 16 U/L (ref 0–53)
AST: 22 U/L (ref 0–37)
Albumin: 4.4 g/dL (ref 3.5–5.2)
Alkaline Phosphatase: 84 U/L (ref 39–117)
BUN: 11 mg/dL (ref 6–23)
CALCIUM: 8.9 mg/dL (ref 8.4–10.5)
CO2: 20 meq/L (ref 19–32)
Chloride: 110 mEq/L (ref 96–112)
Creat: 1.4 mg/dL — ABNORMAL HIGH (ref 0.50–1.35)
Glucose, Bld: 63 mg/dL — ABNORMAL LOW (ref 70–99)
Potassium: 4.3 mEq/L (ref 3.5–5.3)
SODIUM: 142 meq/L (ref 135–145)
TOTAL PROTEIN: 6.9 g/dL (ref 6.0–8.3)
Total Bilirubin: 0.4 mg/dL (ref 0.2–1.2)

## 2014-11-03 LAB — URINE CYTOLOGY ANCILLARY ONLY
Chlamydia: NEGATIVE
NEISSERIA GONORRHEA: NEGATIVE

## 2014-11-03 LAB — T-HELPER CELL (CD4) - (RCID CLINIC ONLY)
CD4 T CELL HELPER: 28 % — AB (ref 33–55)
CD4 T Cell Abs: 820 /uL (ref 400–2700)

## 2014-11-03 LAB — HIV-1 RNA QUANT-NO REFLEX-BLD: HIV 1 RNA Quant: <20 copies/mL (ref <20)

## 2014-11-08 ENCOUNTER — Encounter (HOSPITAL_COMMUNITY): Payer: Self-pay | Admitting: *Deleted

## 2014-11-08 ENCOUNTER — Ambulatory Visit: Payer: No Typology Code available for payment source | Admitting: Internal Medicine

## 2014-11-08 ENCOUNTER — Emergency Department (HOSPITAL_COMMUNITY)
Admission: EM | Admit: 2014-11-08 | Discharge: 2014-11-08 | Disposition: A | Discharge status: Home / Self Care | Payer: Self-pay | Attending: Emergency Medicine | Admitting: Emergency Medicine

## 2014-11-08 ENCOUNTER — Telehealth: Payer: Self-pay | Admitting: Internal Medicine

## 2014-11-08 DIAGNOSIS — L290 Pruritus ani: Secondary | ICD-10-CM | POA: Insufficient documentation

## 2014-11-08 DIAGNOSIS — B20 Human immunodeficiency virus [HIV] disease: Secondary | ICD-10-CM | POA: Insufficient documentation

## 2014-11-08 DIAGNOSIS — Z720 Tobacco use: Secondary | ICD-10-CM | POA: Insufficient documentation

## 2014-11-08 DIAGNOSIS — K644 Residual hemorrhoidal skin tags: Secondary | ICD-10-CM | POA: Insufficient documentation

## 2014-11-08 DIAGNOSIS — Z91040 Latex allergy status: Secondary | ICD-10-CM | POA: Insufficient documentation

## 2014-11-08 DIAGNOSIS — K6289 Other specified diseases of anus and rectum: Secondary | ICD-10-CM

## 2014-11-08 DIAGNOSIS — Z880 Allergy status to penicillin: Secondary | ICD-10-CM | POA: Insufficient documentation

## 2014-11-08 DIAGNOSIS — Z79899 Other long term (current) drug therapy: Secondary | ICD-10-CM | POA: Insufficient documentation

## 2014-11-08 DIAGNOSIS — I10 Essential (primary) hypertension: Secondary | ICD-10-CM | POA: Insufficient documentation

## 2014-11-08 DIAGNOSIS — G8929 Other chronic pain: Secondary | ICD-10-CM | POA: Insufficient documentation

## 2014-11-08 DIAGNOSIS — Z859 Personal history of malignant neoplasm, unspecified: Secondary | ICD-10-CM | POA: Insufficient documentation

## 2014-11-08 DIAGNOSIS — Z792 Long term (current) use of antibiotics: Secondary | ICD-10-CM | POA: Insufficient documentation

## 2014-11-08 DIAGNOSIS — Z8709 Personal history of other diseases of the respiratory system: Secondary | ICD-10-CM | POA: Insufficient documentation

## 2014-11-08 DIAGNOSIS — Z87448 Personal history of other diseases of urinary system: Secondary | ICD-10-CM | POA: Insufficient documentation

## 2014-11-08 LAB — CBC
HEMATOCRIT: 41.3 % (ref 39.0–52.0)
HEMOGLOBIN: 13.9 g/dL (ref 13.0–17.0)
MCH: 32.6 pg (ref 26.0–34.0)
MCHC: 33.7 g/dL (ref 30.0–36.0)
MCV: 96.7 fL (ref 78.0–100.0)
Platelets: 179 10*3/uL (ref 150–400)
RBC: 4.27 MIL/uL (ref 4.22–5.81)
RDW: 13.3 % (ref 11.5–15.5)
WBC: 7.8 10*3/uL (ref 4.0–10.5)

## 2014-11-08 LAB — BASIC METABOLIC PANEL
ANION GAP: 5 (ref 5–15)
BUN: 12 mg/dL (ref 6–23)
CALCIUM: 8.5 mg/dL (ref 8.4–10.5)
CO2: 27 mmol/L (ref 19–32)
CREATININE: 1.4 mg/dL — AB (ref 0.50–1.35)
Chloride: 109 mmol/L (ref 96–112)
GFR calc Af Amer: 70 mL/min — ABNORMAL LOW (ref >90)
GFR calc non Af Amer: 61 mL/min — ABNORMAL LOW (ref >90)
GLUCOSE: 77 mg/dL (ref 70–99)
Potassium: 4.1 mmol/L (ref 3.5–5.1)
Sodium: 141 mmol/L (ref 135–145)

## 2014-11-08 MED ORDER — HYDROCORTISONE ACETATE 25 MG RE SUPP: 25.0000 mg | Freq: Two times a day (BID) | RECTAL | Status: DC

## 2014-11-08 NOTE — ED Provider Notes (Signed)
CSN: [id number]     Arrival date & time 11/08/14  1503 History   First MD Initiated Contact with Patient 11/08/14 1552     Chief Complaint  Patient presents with  . rectal pain      (Consider location/radiation/quality/duration/timing/severity/associated sxs/prior Treatment) HPI   44 year old male with hx of HIV (viral load undetectable, CD4 53), compliant with antiviral meds with hx of anal condyloma, internal/external hemorrhoids, and report years of anal seepage here with c/o increasing rectal pain and itching.  Pt report for the past few years he has had persistent seepage from rectum with watery fecal material.  Report both pain and itching from anus on a persistent basis. Pain is described as pins and needles.  Denies constipation.  Denies recent anal sex. He has been seen for this several times in the past and was never diagnosed with any specific infection.  Did had a rectal biopsy several months ago to remove condyloma and was told that he also has hemorrhoids.  He denies having fever, rectal bleeding, abdominal pain, pustular discharge or rash.  He has tried to f/u with PCP but cannot get an appointment.  He is having difficulty carrying on with daily activities due to having to go to the bathroom to change cotton balls due to seepage.  He denies any other treatment.    Past Medical History  Diagnosis Date  . Hypertension   . HIV (human immunodeficiency virus infection)     UNDER CONTROL WITH MEDICATIONS  . Immune deficiency disorder   . Bronchitis     LAST FLARE UP WAS NEW YEARS 2015  . Renal mass, right   . GERD (gastroesophageal reflux disease)     NO MEDS  . Cancer    Past Surgical History  Procedure Laterality Date  . No past surgeries    . Robot assisted laparoscopic nephrectomy Right 08/19/2013    Procedure: ROBOTIC ASSISTED LAPAROSCOPIC RIGHT PARTIAL NEPHRECTOMY;  Surgeon: Ardis Hughs, MD;  Location: [facility];  Service: Urology;  Laterality: Right;   Family  History  Problem Relation Age of Onset  . Hypertension Mother   . Hypertension Sister   . Hypertension Brother    History  Substance Use Topics  . Smoking status: Light Tobacco Smoker -- 0.10 packs/day    Types: Cigarettes  . Smokeless tobacco: Never Used  . Alcohol Use: No    Review of Systems  All other systems reviewed and are negative.     Allergies  Ibuprofen; Penicillins; and Latex  Home Medications   Prior to Admission medications   Medication Sig Start Date End Date Taking? Authorizing Provider  amLODipine (NORVASC) 5 MG tablet Take 1 tablet (5 mg total) by mouth at bedtime. 03/02/14   Tresa Garter, MD  ATRIPLA 600-200-300 MG per tablet TAKE 1 TABLET BY MOUTH EVERY NIGHT AT BEDTIME 11/02/14   Michel Bickers, MD  cefdinir (OMNICEF) 300 MG capsule Take 1 capsule (300 mg total) by mouth 2 (two) times daily. Patient not taking: Reported on 10/01/2014 09/02/14   Larene Pickett, PA-C  cephALEXin (KEFLEX) 500 MG capsule Take 1 capsule (500 mg total) by mouth 4 (four) times daily. Patient not taking: Reported on 08/13/2014 08/04/14   Jarrett Soho Muthersbaugh, PA-C  efavirenz-emtricitabine-tenofovir (ATRIPLA) 600-200-300 MG per tablet Take 1 tablet by mouth at bedtime. 08/16/14   Truman Hayward, MD  traMADol (ULTRAM) 50 MG tablet Take 1 tablet (50 mg total) by mouth every 6 (six) hours as needed.  Patient not taking: Reported on 08/13/2014 08/08/14   Malvin Johns, MD   BP 143/98 mmHg  Pulse 92  Temp(Src) 98 F (36.7 C) (Oral)  Resp 16  SpO2 100% Physical Exam  Constitutional: He appears well-developed and well-nourished. No distress.  HENT:  Head: Atraumatic.  Eyes: Conjunctivae are normal.  Neck: Normal range of motion. Neck supple.  Cardiovascular: Normal rate and regular rhythm.   Pulmonary/Chest: Effort normal and breath sounds normal.  Abdominal: Soft. Bowel sounds are normal. He exhibits no distension. There is no tenderness.  Genitourinary:  Chaperone present during  exam: external hemorrhoid noted, non thrombosed.  Normal rectal tone, no obvious discharge noted.  No anal fissure, no mass, normal color stool, no evidence to suggest Forniers Gangrene.  No significant discomfort with rectal exam. Hemoccult negative.    Neurological: He is alert.  Skin: No rash noted.  Psychiatric: He has a normal mood and affect.  Nursing note and vitals reviewed.   ED Course  Procedures (including critical care time)  Pt with hx of HIV here with persistent worsening anal seepage ongoing for at least 2 years.  No evidence of obvious infection on exam.  Doubt Fornier's gangrene, or perirectal abscess.  Labs are unremarkable.  Will refer pt to general surgery for further evaluation.  Pt can also f/u with PCP for further care.  Recommend Preparation H for anal pruritus.  Return precaution discussed.    Labs Review Labs Reviewed  BASIC METABOLIC PANEL - Abnormal; Notable for the following:    Creatinine, Ser 1.40 (*)    GFR calc non Af Amer 61 (*)    GFR calc Af Amer 70 (*)    All other components within normal limits  CBC  POC OCCULT BLOOD, ED    Imaging Review No results found.   EKG Interpretation None      MDM   Final diagnoses:  Anal pruritus  Rectal pain, chronic    BP 120/81 mmHg  Pulse 72  Temp(Src) 98 F (36.7 C) (Oral)  Resp 18  SpO2 100%     Domenic Moras, PA-C 11/08/14 [place], MD 11/08/14 1753

## 2014-11-08 NOTE — ED Notes (Signed)
Patient left prior to receiving discharge papers or education and prior to providing discharge signature. Pt did not inform staff of his intention to leave.

## 2014-11-08 NOTE — Discharge Instructions (Signed)
Please use Preparation H as needed for rectal itching.  You will need to follow up with your doctor or with general surgery for further management of your rectal discomfort.  Return if you have concerns.   Anal Pruritus Anal pruritus is an itching of the anus, which is often due to increased moisture of the skin around the anus. Moisture may be due to sweating or a small amount of remaining stool. The itching and scratching can cause further skin damage.  CAUSES   Poor hygiene.  Excessive moisture from sweating or residual stool in the anal area.  Perfumed soaps and sprays and colored toilet paper.  Chemicals in the foods you eat.  Dietary factors such as caffeine, beer, milk products, chocolate, nuts, citrus fruits, tomatoes, spicy seasonings, jalapeno peppers, and salsa.  Hemorrhoids, infections, and other anal diseases.  Excessive washing.  Overuse of laxatives.  Skin disorders (psoriasis, eczema, or seborrhea). HOME CARE INSTRUCTIONS   Practice good hygiene.  Clean the anal area gently with wet toilet paper, baby wipes, or a wet washcloth after every bowel movement and at bedtime. Avoid using soaps on the anal area. Dry the area thoroughly. Pat the area dry with toilet paper or a towel.  Do not scrub the anal area with anything, even toilet paper.  Try not to scratch the itchy area. Scratching produces more damage, which makes the itching worse.  Take sitz baths in warm water for 15 to 20 minutes, 2 to 3 times a day. Pat the area dry with a soft cloth after each bath.  Zinc oxide ointment or a moisture barrier cream can be applied several times daily to protect the skin.  Only take medicines as directed by your caregiver.  Talk to your caregiver about fiber supplements. These are helpful in normalizing the stool if you have frequent loose stools.  Wear cotton underwear and loose clothing.  Do not use irritants such as bubble baths, scented toilet paper, or genital  deodorants. SEEK MEDICAL CARE IF:   Itching does not improve in several days or gets worse.  You have a fever.  There are problems with increased pain, swelling, or redness. MAKE SURE YOU:   Understand these instructions.  Will watch your condition.  Will get help right away if you are not doing well or get worse. Document Released: 01/15/2011 Document Revised: 10/08/2011 Document Reviewed: 01/15/2011 [facility] Patient Information 2015 [place], [place]. This information is not intended to replace advice given to you by your health care provider. Make sure you discuss any questions you have with your health care provider.

## 2014-11-08 NOTE — ED Notes (Signed)
Pt hx of HIV, complaint with meds, reports years of anal seepage, but anal seepage, pain and itching have increased in last week. Pain 8/10. Denies recent anal sex.

## 2014-11-08 NOTE — Telephone Encounter (Signed)
Patient has called in today to reschedule appointment from this morning; patient would like to be seen this week if possible; please f/u with patient about status of this request

## 2014-11-09 LAB — POC OCCULT BLOOD, ED: Fecal Occult Bld: POSITIVE — AB

## 2014-11-25 ENCOUNTER — Ambulatory Visit: Payer: Self-pay | Admitting: Internal Medicine

## 2014-12-03 ENCOUNTER — Other Ambulatory Visit: Payer: Self-pay | Admitting: Internal Medicine

## 2015-02-03 ENCOUNTER — Other Ambulatory Visit: Payer: Self-pay | Admitting: *Deleted

## 2015-02-03 DIAGNOSIS — B20 Human immunodeficiency virus [HIV] disease: Secondary | ICD-10-CM

## 2015-02-03 MED ORDER — EFAVIRENZ-EMTRICITAB-TENOFOVIR 600-200-300 MG PO TABS: 1.0000 | ORAL_TABLET | Freq: Every day | ORAL | Status: DC

## 2015-02-03 NOTE — Telephone Encounter (Signed)
ADAP Application 

## 2015-02-16 ENCOUNTER — Ambulatory Visit: Payer: Self-pay | Attending: Internal Medicine

## 2015-02-23 ENCOUNTER — Other Ambulatory Visit: Payer: Self-pay | Admitting: *Deleted

## 2015-02-23 DIAGNOSIS — B20 Human immunodeficiency virus [HIV] disease: Secondary | ICD-10-CM

## 2015-02-23 MED ORDER — EFAVIRENZ-EMTRICITAB-TENOFOVIR 600-200-300 MG PO TABS: 1.0000 | ORAL_TABLET | Freq: Every day | ORAL | Status: DC

## 2015-03-07 ENCOUNTER — Emergency Department (HOSPITAL_COMMUNITY)
Admission: EM | Admit: 2015-03-07 | Discharge: 2015-03-07 | Disposition: A | Discharge status: Home / Self Care | Payer: Self-pay | Attending: Emergency Medicine | Admitting: Emergency Medicine

## 2015-03-07 ENCOUNTER — Encounter (HOSPITAL_COMMUNITY): Payer: Self-pay | Admitting: Emergency Medicine

## 2015-03-07 DIAGNOSIS — Z21 Asymptomatic human immunodeficiency virus [HIV] infection status: Secondary | ICD-10-CM | POA: Insufficient documentation

## 2015-03-07 DIAGNOSIS — Z862 Personal history of diseases of the blood and blood-forming organs and certain disorders involving the immune mechanism: Secondary | ICD-10-CM | POA: Insufficient documentation

## 2015-03-07 DIAGNOSIS — Z720 Tobacco use: Secondary | ICD-10-CM | POA: Insufficient documentation

## 2015-03-07 DIAGNOSIS — Z91040 Latex allergy status: Secondary | ICD-10-CM | POA: Insufficient documentation

## 2015-03-07 DIAGNOSIS — I10 Essential (primary) hypertension: Secondary | ICD-10-CM | POA: Insufficient documentation

## 2015-03-07 DIAGNOSIS — L0100 Impetigo, unspecified: Secondary | ICD-10-CM | POA: Insufficient documentation

## 2015-03-07 DIAGNOSIS — Z880 Allergy status to penicillin: Secondary | ICD-10-CM | POA: Insufficient documentation

## 2015-03-07 DIAGNOSIS — Z8709 Personal history of other diseases of the respiratory system: Secondary | ICD-10-CM | POA: Insufficient documentation

## 2015-03-07 DIAGNOSIS — Z87448 Personal history of other diseases of urinary system: Secondary | ICD-10-CM | POA: Insufficient documentation

## 2015-03-07 DIAGNOSIS — Z8719 Personal history of other diseases of the digestive system: Secondary | ICD-10-CM | POA: Insufficient documentation

## 2015-03-07 MED ORDER — MUPIROCIN 2 % EX OINT: 1.0000 "application " | TOPICAL_OINTMENT | Freq: Three times a day (TID) | CUTANEOUS | Status: DC

## 2015-03-07 NOTE — Discharge Instructions (Signed)
Return to the Emergency Department if you develop fever of 101 or if the symptoms become worse.

## 2015-03-07 NOTE — ED Notes (Signed)
Pt A+ox4, reports painful red, swollen, blisters to R nare, x3 days.  Pt denies other skin issues or other complaints.  Skin othewise PWD.  MAEI.  Speaking full/clear sentences, rr even/un-lab.  NAD.

## 2015-03-07 NOTE — ED Provider Notes (Signed)
CSN: [id number]     Arrival date & time 03/07/15  1208 History  This chart was scribed for non-physician practitioner, Hyman Bible, PA-C, working with Sharlett Iles, MD by Ladene Artist, ED Scribe. This patient was seen in room WTR8/WTR8 and the patient's care was started at 12:46 PM.   Chief Complaint  Patient presents with  . Skin Problem    R nare, red, swollen, painful, blisters present   The history is provided by the patient. No language interpreter was used.   HPI Comments: Drew Cuevas is a 44 y.o. male, with a h/o HTN and HIV, who presents to the Emergency Department complaining of gradually worsening painful blisters in right nare for the past 2 days. Pt states that he first noticed blisters to his right nare 2 days ago that worsened yesterday morning. He denies bleeding from the area but reports drainage of pus. No known injury. He reports h/o same but states that blisters resolved after applying Carmex. He tried Carmex yesterday without relief. Pt denies fever. Pt works as a Quarry manager at a nursing home.   Past Medical History  Diagnosis Date  . Hypertension   . HIV (human immunodeficiency virus infection)     UNDER CONTROL WITH MEDICATIONS  . Immune deficiency disorder   . Bronchitis     LAST FLARE UP WAS NEW YEARS 2015  . Renal mass, right   . GERD (gastroesophageal reflux disease)     NO MEDS  . Cancer    Past Surgical History  Procedure Laterality Date  . No past surgeries    . Robot assisted laparoscopic nephrectomy Right 08/19/2013    Procedure: ROBOTIC ASSISTED LAPAROSCOPIC RIGHT PARTIAL NEPHRECTOMY;  Surgeon: Ardis Hughs, MD;  Location: [facility];  Service: Urology;  Laterality: Right;   Family History  Problem Relation Age of Onset  . Hypertension Mother   . Hypertension Sister   . Hypertension Brother    History  Substance Use Topics  . Smoking status: Light Tobacco Smoker -- 0.10 packs/day    Types: Cigarettes  . Smokeless tobacco: Never Used  .  Alcohol Use: No    Review of Systems  Constitutional: Negative for fever.  HENT:       + Nose sores   Allergies  Ibuprofen; Penicillins; and Latex  Home Medications   Prior to Admission medications   Medication Sig Start Date End Date Taking? Authorizing Provider  amLODipine (NORVASC) 5 MG tablet Take 1 tablet (5 mg total) by mouth at bedtime. 03/02/14   Tresa Garter, MD  cefdinir (OMNICEF) 300 MG capsule Take 1 capsule (300 mg total) by mouth 2 (two) times daily. Patient not taking: Reported on 10/01/2014 09/02/14   Larene Pickett, PA-C  cephALEXin (KEFLEX) 500 MG capsule Take 1 capsule (500 mg total) by mouth 4 (four) times daily. Patient not taking: Reported on 08/13/2014 08/04/14   Jarrett Soho Muthersbaugh, PA-C  efavirenz-emtricitabine-tenofovir (ATRIPLA) 600-200-300 MG per tablet Take 1 tablet by mouth at bedtime. 02/23/15   Carlyle Basques, MD  hydrocortisone (ANUSOL-HC) 25 MG suppository Place 1 suppository (25 mg total) rectally 2 (two) times daily. For 7 days 11/08/14   Domenic Moras, PA-C  traMADol (ULTRAM) 50 MG tablet Take 1 tablet (50 mg total) by mouth every 6 (six) hours as needed. Patient not taking: Reported on 08/13/2014 08/08/14   Malvin Johns, MD   BP 134/96 mmHg  Pulse 80  Temp(Src) 97.9 F (36.6 C) (Oral)  Resp 20  Ht 5\' 6"  (1.676  m)  Wt 192 lb (87.091 kg)  BMI 31.00 kg/m2  SpO2 100% Physical Exam  Constitutional: He is oriented to person, place, and time. He appears well-developed and well-nourished. No distress.  HENT:  Head: Normocephalic and atraumatic.  Mouth/Throat: Mucous membranes are normal.  R nare: Honey-crusted lesions of the R medial inferior nare.   Eyes: Conjunctivae and EOM are normal.  Neck: Neck supple. No tracheal deviation present.  Cardiovascular: Normal rate, regular rhythm and intact distal pulses.   Pulmonary/Chest: Effort normal and breath sounds normal. No respiratory distress.  Musculoskeletal: Normal range of motion.  Neurological: He  is alert and oriented to person, place, and time.  Skin: Skin is warm and dry.  Psychiatric: He has a normal mood and affect. His behavior is normal.  Nursing note and vitals reviewed.  ED Course  Procedures (including critical care time) DIAGNOSTIC STUDIES: Oxygen Saturation is 100% on RA, normal by my interpretation.    COORDINATION OF CARE: 12:50 PM-Discussed treatment plan which includes Bactroban with pt at bedside and pt agreed to plan.   Labs Review Labs Reviewed - No data to display  Imaging Review No results found.   EKG Interpretation None      MDM   Final diagnoses:  None  Patient presents today with honey colored crusting to the inferior medial right nostril.  Appearance at this time most consistent with Impetigo.  No abscess palpated or visualized.  Patient afebrile.  Feel that the patient is stable for discharge.  Return precautions given.  I personally performed the services described in this documentation, which was scribed in my presence. The recorded information has been reviewed and is accurate.    Hyman Bible, PA-C 03/08/15 2152  Sharlett Iles, MD 03/09/15 [phone]

## 2015-03-28 ENCOUNTER — Telehealth: Payer: Self-pay | Admitting: Internal Medicine

## 2015-03-30 ENCOUNTER — Other Ambulatory Visit (INDEPENDENT_AMBULATORY_CARE_PROVIDER_SITE_OTHER): Payer: Self-pay

## 2015-03-30 ENCOUNTER — Ambulatory Visit (HOSPITAL_COMMUNITY)
Admission: RE | Admit: 2015-03-30 | Discharge: 2015-03-30 | Disposition: A | Discharge status: Home / Self Care | Payer: Self-pay | Source: Ambulatory Visit | Attending: Urology | Admitting: Urology

## 2015-03-30 ENCOUNTER — Other Ambulatory Visit: Payer: Self-pay | Admitting: Urology

## 2015-03-30 DIAGNOSIS — C641 Malignant neoplasm of right kidney, except renal pelvis: Secondary | ICD-10-CM | POA: Insufficient documentation

## 2015-03-30 DIAGNOSIS — B20 Human immunodeficiency virus [HIV] disease: Secondary | ICD-10-CM

## 2015-03-30 DIAGNOSIS — Z905 Acquired absence of kidney: Secondary | ICD-10-CM | POA: Insufficient documentation

## 2015-03-30 DIAGNOSIS — C649 Malignant neoplasm of unspecified kidney, except renal pelvis: Secondary | ICD-10-CM

## 2015-03-31 LAB — T-HELPER CELL (CD4) - (RCID CLINIC ONLY)
CD4 % Helper T Cell: 29 % — ABNORMAL LOW (ref 33–55)
CD4 T Cell Abs: 770 /uL (ref 400–2700)

## 2015-03-31 LAB — HIV-1 RNA QUANT-NO REFLEX-BLD
HIV 1 RNA Quant: <20 copies/mL (ref <20)
HIV-1 RNA Quant, Log: <1.3 {Log} (ref <1.30)

## 2015-04-05 NOTE — Telephone Encounter (Signed)
Patient is calling to obtain a sample for his amLODipine (NORVASC) 5 MG tablet. He has not been seen since last year but he went ahead and scheduled and appointment for October to follow up with pcp.

## 2015-04-06 ENCOUNTER — Encounter (HOSPITAL_COMMUNITY)
Admission: RE | Admit: 2015-04-06 | Discharge: 2015-04-06 | Disposition: A | Discharge status: Home / Self Care | Payer: Self-pay | Source: Ambulatory Visit | Attending: Urology | Admitting: Urology

## 2015-04-06 ENCOUNTER — Encounter (HOSPITAL_COMMUNITY): Payer: Self-pay

## 2015-04-06 DIAGNOSIS — C649 Malignant neoplasm of unspecified kidney, except renal pelvis: Secondary | ICD-10-CM

## 2015-04-06 LAB — POCT I-STAT CREATININE: CREATININE: 1.3 mg/dL — AB (ref 0.61–1.24)

## 2015-04-06 MED ORDER — IOHEXOL 300 MG/ML  SOLN
100.0000 mL | Freq: Once | INTRAMUSCULAR | Status: AC | PRN
  Administered 2015-04-06: 100 mL via INTRAVENOUS

## 2015-04-08 ENCOUNTER — Other Ambulatory Visit: Payer: Self-pay

## 2015-04-08 DIAGNOSIS — I10 Essential (primary) hypertension: Secondary | ICD-10-CM

## 2015-04-08 MED ORDER — AMLODIPINE BESYLATE 5 MG PO TABS: 5.0000 mg | ORAL_TABLET | Freq: Every day | ORAL | Status: DC

## 2015-04-08 NOTE — Telephone Encounter (Signed)
Patient came in requesting a medication refill for amLODipine (NORVASC)

## 2015-04-21 NOTE — Telephone Encounter (Signed)
Called pt and pt verified name and DOB. Pt had pick up refill for his Amlodipine and verbalize that he understood that he needed an appt for additional refill. Pt states that he has an appt set for the month October.

## 2015-04-25 ENCOUNTER — Telehealth: Payer: Self-pay | Admitting: Internal Medicine

## 2015-04-25 NOTE — Telephone Encounter (Signed)
Patient called requesting a referral for the dentist. Patient would like to speak to the provider. Please f/u with pt.

## 2015-04-25 NOTE — Telephone Encounter (Signed)
Patient called again stating that his tooth is cutting his tongue and is wondering if he can get a sooner appointment to obtain his referral. Please follow up with pt. Thank you.

## 2015-05-12 ENCOUNTER — Ambulatory Visit: Payer: Self-pay | Admitting: Internal Medicine

## 2015-05-17 ENCOUNTER — Telehealth: Payer: Self-pay | Admitting: *Deleted

## 2015-05-17 ENCOUNTER — Encounter: Payer: Self-pay | Admitting: Internal Medicine

## 2015-05-17 ENCOUNTER — Ambulatory Visit (INDEPENDENT_AMBULATORY_CARE_PROVIDER_SITE_OTHER): Payer: Self-pay | Admitting: Internal Medicine

## 2015-05-17 VITALS — BP 131/86 | HR 82 | Temp 98.1°F | Ht 66.0 in | Wt 198.4 lb

## 2015-05-17 DIAGNOSIS — N289 Disorder of kidney and ureter, unspecified: Secondary | ICD-10-CM

## 2015-05-17 DIAGNOSIS — B20 Human immunodeficiency virus [HIV] disease: Secondary | ICD-10-CM

## 2015-05-17 DIAGNOSIS — K648 Other hemorrhoids: Secondary | ICD-10-CM

## 2015-05-17 HISTORY — DX: Disorder of kidney and ureter, unspecified: N28.9

## 2015-05-17 NOTE — Telephone Encounter (Signed)
Per Dr. Megan Salon, patient scheduled for follow up at [facility] clinic with Dr. Ailene Ravel to address leakage and hemorrhoids. Patient scheduled 10/28 10:15.  He knows where the office is.  Patient given appointment info and phone number: [phone].  He accepted the appointment. Landis Gandy, RN

## 2015-05-17 NOTE — Assessment & Plan Note (Signed)
I will see if we can find the name of the general surgeon he saw and refer him back for further evaluation of the chronic anal seepage.

## 2015-05-17 NOTE — Assessment & Plan Note (Signed)
His HIV infection remains under excellent control. He has some mild renal insufficiency and only one kidney. I discussed the options of switching Atripla to [place] or Triumeq. He does not eat meals on a regular basis so Triumeq would be easier for him to take. I will check his HLA [id number]. If it is negative I will switch Atripla to Triumeq. He will follow-up after a full set of lab work in 6 months.

## 2015-05-17 NOTE — Progress Notes (Signed)
Patient ID: Drew Cuevas, male   DOB: 11-Nov-1970, 44 y.o.   MRN: [id number]          Patient Active Problem List   Diagnosis Date Noted   Human immunodeficiency virus (HIV) disease ([place]) 08/28/2006    Priority: High   Renal insufficiency 05/17/2015    Priority: Medium   Renal cell carcinoma of right kidney ([place]) 08/19/2013    Priority: Medium   Essential hypertension, benign 04/28/2008    Priority: Medium   Renal cyst 05/21/2013   Obesity (BMI 35.0-39.9 without comorbidity) ([place]) 05/26/2012   Diarrhea 11/07/2011   Right knee pain 04/30/2011   Recurrent boils 06/10/2009   Internal hemorrhoids 12/01/2008   DEPRESSION, ACUTE 12/20/2006    Patient's Medications  New Prescriptions   No medications on file  Previous Medications   AMLODIPINE (NORVASC) 5 MG TABLET    Take 1 tablet (5 mg total) by mouth at bedtime.   EFAVIRENZ-EMTRICITABINE-TENOFOVIR (ATRIPLA) 600-200-300 MG PER TABLET    Take 1 tablet by mouth at bedtime.  Modified Medications   No medications on file  Discontinued Medications   CEFDINIR (OMNICEF) 300 MG CAPSULE    Take 1 capsule (300 mg total) by mouth 2 (two) times daily.   CEPHALEXIN (KEFLEX) 500 MG CAPSULE    Take 1 capsule (500 mg total) by mouth 4 (four) times daily.   HYDROCORTISONE (ANUSOL-HC) 25 MG SUPPOSITORY    Place 1 suppository (25 mg total) rectally 2 (two) times daily. For 7 days   MUPIROCIN OINTMENT (BACTROBAN) 2 %    Place 1 application into the nose 3 (three) times daily. Use for 5 days   TRAMADOL (ULTRAM) 50 MG TABLET    Take 1 tablet (50 mg total) by mouth every 6 (six) hours as needed.    Subjective: Drew Cuevas is in for his first HIV visit since one year ago. He continues to take Atripla and does not recall missing doses. He does have crazy dreams but this is unchanged and does not bother him. He has a history of renal cell carcinoma requiring nephrectomy. He had an anal Pap in January which was negative but showed some hemorrhoids. He  states that he was seen one time by general surgeon at [facility]. He does not think that he mentioned the chronic anal seepage that he has. He states that continues to be a severe problem. The surgeon did not want to do anything about his small hemorrhoid. He does not recall the name of the surgeon.   Review of Systems: Pertinent items are noted in HPI.  Past Medical History  Diagnosis Date   Hypertension    HIV (human immunodeficiency virus infection) ([place])     UNDER CONTROL WITH MEDICATIONS   Immune deficiency disorder ([place])    Bronchitis     LAST FLARE UP WAS NEW YEARS 2015   Renal mass, right    GERD (gastroesophageal reflux disease)     NO MEDS   Cancer ([place])    Renal insufficiency 05/17/2015    Social History  Substance Use Topics   Smoking status: Light Tobacco Smoker -- 0.10 packs/day    Types: Cigarettes   Smokeless tobacco: Never Used   Alcohol Use: No    Family History  Problem Relation Age of Onset   Hypertension Mother    Hypertension Sister    Hypertension Brother     Allergies  Allergen Reactions   Ibuprofen Anaphylaxis, Swelling and Other (See Comments)  Dehydration Swelling of the "moist areas" (throat, mouth)   Penicillins Hives   Latex Rash    Objective:  Filed Vitals:   05/17/15 1014  BP: 131/86  Pulse: 82  Temp: 98.1 F (36.7 C)  TempSrc: Oral  Height: '5\' 6"'  (1.676 m)  Weight: 198 lb 6.4 oz (89.994 kg)   Body mass index is 32.04 kg/(m^2).  General: He is smiling and in good spirits Oral: No oropharyngeal lesions Skin: No rash Lungs: Clear Cor: Regular S1 and S2 with no murmurs Mood: Normal he does not appear anxious or depressed  Lab Results Lab Results  Component Value Date   WBC 7.8 11/08/2014   HGB 13.9 11/08/2014   HCT 41.3 11/08/2014   MCV 96.7 11/08/2014   PLT 179 11/08/2014    Lab Results  Component Value Date   CREATININE 1.30* 04/06/2015   BUN 12 11/08/2014    NA 141 11/08/2014   K 4.1 11/08/2014   CL 109 11/08/2014   CO2 27 11/08/2014    Lab Results  Component Value Date   ALT 16 11/02/2014   AST 22 11/02/2014   ALKPHOS 84 11/02/2014   BILITOT 0.4 11/02/2014    Lab Results  Component Value Date   CHOL 238* 11/02/2014   HDL 34* 11/02/2014   LDLCALC 186* 11/02/2014   TRIG 92 11/02/2014   CHOLHDL 7.0 11/02/2014    Lab Results HIV 1 RNA QUANT (copies/mL)  Date Value  03/30/2015 <20  11/02/2014 <20  03/02/2014 <20   CD4 T CELL ABS (/uL)  Date Value  03/30/2015 770  11/02/2014 820  09/29/2013 860     Problem List Items Addressed This Visit      High   Human immunodeficiency virus (HIV) disease ([place])    His HIV infection remains under excellent control. He has some mild renal insufficiency and only one kidney. I discussed the options of switching Atripla to [place] or Triumeq. He does not eat meals on a regular basis so Triumeq would be easier for him to take. I will check his HLA B5701. If it is negative I will switch Atripla to Triumeq. He will follow-up after a full set of lab work in 6 months.      Relevant Orders   HLA B*5701   T-helper cell (CD4)- ([facility] clinic only)   HIV 1 RNA quant-no reflex-bld   CBC   Comprehensive metabolic panel   Lipid panel   RPR     Medium   Renal insufficiency - Primary     Unprioritized   Internal hemorrhoids    I will see if we can find the name of the general surgeon he saw and refer him back for further evaluation of the chronic anal seepage.           Michel Bickers, MD [facility] [phone] pager   [phone] cell 05/17/2015, 10:54 AM

## 2015-05-22 LAB — HLA B*5701: HLA-B*5701 w/rflx HLA-B High: NEGATIVE

## 2015-05-25 ENCOUNTER — Telehealth: Payer: Self-pay | Admitting: Internal Medicine

## 2015-05-25 DIAGNOSIS — B20 Human immunodeficiency virus [HIV] disease: Secondary | ICD-10-CM

## 2015-05-25 MED ORDER — ABACAVIR-DOLUTEGRAVIR-LAMIVUD 600-50-300 MG PO TABS: 1.0000 | ORAL_TABLET | Freq: Every day | ORAL | Status: DC

## 2015-05-25 NOTE — Telephone Encounter (Signed)
His HLA B5701 is negative. I will change his antiretroviral regimen to Triumeq 1 daily.

## 2015-05-26 ENCOUNTER — Ambulatory Visit: Payer: Self-pay | Admitting: Internal Medicine

## 2015-05-29 ENCOUNTER — Other Ambulatory Visit: Payer: Self-pay | Admitting: Internal Medicine

## 2015-06-02 ENCOUNTER — Ambulatory Visit: Payer: Self-pay | Attending: Internal Medicine | Admitting: Internal Medicine

## 2015-06-02 ENCOUNTER — Encounter: Payer: Self-pay | Admitting: Internal Medicine

## 2015-06-02 VITALS — BP 116/82 | HR 89 | Temp 98.3°F | Resp 18 | Ht 66.0 in | Wt 197.8 lb

## 2015-06-02 DIAGNOSIS — F17210 Nicotine dependence, cigarettes, uncomplicated: Secondary | ICD-10-CM | POA: Insufficient documentation

## 2015-06-02 DIAGNOSIS — Z880 Allergy status to penicillin: Secondary | ICD-10-CM | POA: Insufficient documentation

## 2015-06-02 DIAGNOSIS — I10 Essential (primary) hypertension: Secondary | ICD-10-CM | POA: Insufficient documentation

## 2015-06-02 DIAGNOSIS — B20 Human immunodeficiency virus [HIV] disease: Secondary | ICD-10-CM | POA: Insufficient documentation

## 2015-06-02 DIAGNOSIS — K219 Gastro-esophageal reflux disease without esophagitis: Secondary | ICD-10-CM | POA: Insufficient documentation

## 2015-06-02 DIAGNOSIS — Z79899 Other long term (current) drug therapy: Secondary | ICD-10-CM | POA: Insufficient documentation

## 2015-06-02 DIAGNOSIS — Z91040 Latex allergy status: Secondary | ICD-10-CM | POA: Insufficient documentation

## 2015-06-02 DIAGNOSIS — N289 Disorder of kidney and ureter, unspecified: Secondary | ICD-10-CM | POA: Insufficient documentation

## 2015-06-02 MED ORDER — AMLODIPINE BESYLATE 5 MG PO TABS: 5.0000 mg | ORAL_TABLET | Freq: Every day | ORAL | Status: DC

## 2015-06-02 NOTE — Progress Notes (Signed)
Patient here for wellness checkup. Patient denies pain at this time.

## 2015-06-02 NOTE — Patient Instructions (Signed)
DASH Eating Plan °DASH stands for "Dietary Approaches to Stop Hypertension." The DASH eating plan is a healthy eating plan that has been shown to reduce high blood pressure (hypertension). Additional health benefits may include reducing the risk of type 2 diabetes mellitus, heart disease, and stroke. The DASH eating plan may also help with weight loss. °WHAT DO I NEED TO KNOW ABOUT THE DASH EATING PLAN? °For the DASH eating plan, you will follow these general guidelines: °· Choose foods with a percent daily value for sodium of less than 5% (as listed on the food label). °· Use salt-free seasonings or herbs instead of table salt or sea salt. °· Check with your health care provider or pharmacist before using salt substitutes. °· Eat lower-sodium products, often labeled as "lower sodium" or "no salt added." °· Eat fresh foods. °· Eat more vegetables, fruits, and low-fat dairy products. °· Choose whole grains. Look for the word "whole" as the first word in the ingredient list. °· Choose fish and skinless chicken or turkey more often than red meat. Limit fish, poultry, and meat to 6 oz (170 g) each day. °· Limit sweets, desserts, sugars, and sugary drinks. °· Choose heart-healthy fats. °· Limit cheese to 1 oz (28 g) per day. °· Eat more home-cooked food and less restaurant, buffet, and fast food. °· Limit fried foods. °· Cook foods using methods other than frying. °· Limit canned vegetables. If you do use them, rinse them well to decrease the sodium. °· When eating at a restaurant, ask that your food be prepared with less salt, or no salt if possible. °WHAT FOODS CAN I EAT? °Seek help from a dietitian for individual calorie needs. °Grains °Whole grain or whole wheat bread. Brown rice. Whole grain or whole wheat pasta. Quinoa, bulgur, and whole grain cereals. Low-sodium cereals. Corn or whole wheat flour tortillas. Whole grain cornbread. Whole grain crackers. Low-sodium crackers. °Vegetables °Fresh or frozen vegetables  (raw, steamed, roasted, or grilled). Low-sodium or reduced-sodium tomato and vegetable juices. Low-sodium or reduced-sodium tomato sauce and paste. Low-sodium or reduced-sodium canned vegetables.  °Fruits °All fresh, canned (in natural juice), or frozen fruits. °Meat and Other Protein Products °Ground beef (85% or leaner), grass-fed beef, or beef trimmed of fat. Skinless chicken or turkey. Ground chicken or turkey. Pork trimmed of fat. All fish and seafood. Eggs. Dried beans, peas, or lentils. Unsalted nuts and seeds. Unsalted canned beans. °Dairy °Low-fat dairy products, such as skim or 1% milk, 2% or reduced-fat cheeses, low-fat ricotta or cottage cheese, or plain low-fat yogurt. Low-sodium or reduced-sodium cheeses. °Fats and Oils °Tub margarines without trans fats. Light or reduced-fat mayonnaise and salad dressings (reduced sodium). Avocado. Safflower, olive, or canola oils. Natural peanut or almond butter. °Other °Unsalted popcorn and pretzels. °The items listed above may not be a complete list of recommended foods or beverages. Contact your dietitian for more options. °WHAT FOODS ARE NOT RECOMMENDED? °Grains °White bread. White pasta. White rice. Refined cornbread. Bagels and croissants. Crackers that contain trans fat. °Vegetables °Creamed or fried vegetables. Vegetables in a cheese sauce. Regular canned vegetables. Regular canned tomato sauce and paste. Regular tomato and vegetable juices. °Fruits °Dried fruits. Canned fruit in light or heavy syrup. Fruit juice. °Meat and Other Protein Products °Fatty cuts of meat. Ribs, chicken wings, bacon, sausage, bologna, salami, chitterlings, fatback, hot dogs, bratwurst, and packaged luncheon meats. Salted nuts and seeds. Canned beans with salt. °Dairy °Whole or 2% milk, cream, half-and-half, and cream cheese. Whole-fat or sweetened yogurt. Full-fat   cheeses or blue cheese. Nondairy creamers and whipped toppings. Processed cheese, cheese spreads, or cheese  curds. °Condiments °Onion and garlic salt, seasoned salt, table salt, and sea salt. Canned and packaged gravies. Worcestershire sauce. Tartar sauce. Barbecue sauce. Teriyaki sauce. Soy sauce, including reduced sodium. Steak sauce. Fish sauce. Oyster sauce. Cocktail sauce. Horseradish. Ketchup and mustard. Meat flavorings and tenderizers. Bouillon cubes. Hot sauce. Tabasco sauce. Marinades. Taco seasonings. Relishes. °Fats and Oils °Butter, stick margarine, lard, shortening, ghee, and bacon fat. Coconut, palm kernel, or palm oils. Regular salad dressings. °Other °Pickles and olives. Salted popcorn and pretzels. °The items listed above may not be a complete list of foods and beverages to avoid. Contact your dietitian for more information. °WHERE CAN I FIND MORE INFORMATION? °National Heart, Lung, and Blood Institute: www.nhlbi.nih.gov/health/health-topics/topics/dash/ °  °This information is not intended to replace advice given to you by your health care provider. Make sure you discuss any questions you have with your health care provider. °  °Document Released: 07/05/2011 Document Revised: 08/06/2014 Document Reviewed: 05/20/2013 °Elsevier Interactive Patient Education ©2016 Elsevier Inc. ° °Hypertension °Hypertension, commonly called high blood pressure, is when the force of blood pumping through your arteries is too strong. Your arteries are the blood vessels that carry blood from your heart throughout your body. A blood pressure reading consists of a higher number over a lower number, such as 110/72. The higher number (systolic) is the pressure inside your arteries when your heart pumps. The lower number (diastolic) is the pressure inside your arteries when your heart relaxes. Ideally you want your blood pressure below 120/80. °Hypertension forces your heart to work harder to pump blood. Your arteries may become narrow or stiff. Having untreated or uncontrolled hypertension can cause heart attack, stroke, kidney  disease, and other problems. °RISK FACTORS °Some risk factors for high blood pressure are controllable. Others are not.  °Risk factors you cannot control include:  °· Race. You may be at higher risk if you are African American. °· Age. Risk increases with age. °· Gender. Men are at higher risk than women before age 45 years. After age 65, women are at higher risk than men. °Risk factors you can control include: °· Not getting enough exercise or physical activity. °· Being overweight. °· Getting too much fat, sugar, calories, or salt in your diet. °· Drinking too much alcohol. °SIGNS AND SYMPTOMS °Hypertension does not usually cause signs or symptoms. Extremely high blood pressure (hypertensive crisis) may cause headache, anxiety, shortness of breath, and nosebleed. °DIAGNOSIS °To check if you have hypertension, your health care provider will measure your blood pressure while you are seated, with your arm held at the level of your heart. It should be measured at least twice using the same arm. Certain conditions can cause a difference in blood pressure between your right and left arms. A blood pressure reading that is higher than normal on one occasion does not mean that you need treatment. If it is not clear whether you have high blood pressure, you may be asked to return on a different day to have your blood pressure checked again. Or, you may be asked to monitor your blood pressure at home for 1 or more weeks. °TREATMENT °Treating high blood pressure includes making lifestyle changes and possibly taking medicine. Living a healthy lifestyle can help lower high blood pressure. You may need to change some of your habits. °Lifestyle changes may include: °· Following the DASH diet. This diet is high in fruits, vegetables, and whole   grains. It is low in salt, red meat, and added sugars. °· Keep your sodium intake below 2,300 mg per day. °· Getting at least 30-45 minutes of aerobic exercise at least 4 times per  week. °· Losing weight if necessary. °· Not smoking. °· Limiting alcoholic beverages. °· Learning ways to reduce stress. °Your health care provider may prescribe medicine if lifestyle changes are not enough to get your blood pressure under control, and if one of the following is true: °· You are 18-59 years of age and your systolic blood pressure is above 140. °· You are 60 years of age or older, and your systolic blood pressure is above 150. °· Your diastolic blood pressure is above 90. °· You have diabetes, and your systolic blood pressure is over 140 or your diastolic blood pressure is over 90. °· You have kidney disease and your blood pressure is above 140/90. °· You have heart disease and your blood pressure is above 140/90. °Your personal target blood pressure may vary depending on your medical conditions, your age, and other factors. °HOME CARE INSTRUCTIONS °· Have your blood pressure rechecked as directed by your health care provider.   °· Take medicines only as directed by your health care provider. Follow the directions carefully. Blood pressure medicines must be taken as prescribed. The medicine does not work as well when you skip doses. Skipping doses also puts you at risk for problems. °· Do not smoke.   °· Monitor your blood pressure at home as directed by your health care provider.  °SEEK MEDICAL CARE IF:  °· You think you are having a reaction to medicines taken. °· You have recurrent headaches or feel dizzy. °· You have swelling in your ankles. °· You have trouble with your vision. °SEEK IMMEDIATE MEDICAL CARE IF: °· You develop a severe headache or confusion. °· You have unusual weakness, numbness, or feel faint. °· You have severe chest or abdominal pain. °· You vomit repeatedly. °· You have trouble breathing. °MAKE SURE YOU:  °· Understand these instructions. °· Will watch your condition. °· Will get help right away if you are not doing well or get worse. °  °This information is not intended to  replace advice given to you by your health care provider. Make sure you discuss any questions you have with your health care provider. °  °Document Released: 07/16/2005 Document Revised: 11/30/2014 Document Reviewed: 05/08/2013 °Elsevier Interactive Patient Education ©2016 Elsevier Inc. ° °

## 2015-06-02 NOTE — Progress Notes (Signed)
Patient ID: Drew Cuevas, male   DOB: [birth year], 44 y.o.   MRN: [id number]   Drew Cuevas, is a 44 y.o. male  LYY:[id number]  LEX:[id number]  DOB - 15-Jun-1971  Chief Complaint  Patient presents with  . Follow-up        Subjective:   Drew Cuevas is a 44 y.o. male here today for a follow up visit. Pleasant young man with history of hypertension and HIV disease well controlled on current medications. Patient is here today for refill of amlodipine 5 mg tablet by mouth daily for hypertension. Blood pressure is controlled. Patient is compliant with his medications. He reports no side effects. He currently works as Programmer, applications, lives with a friend at home, denies outright depression, denies any suicidal ideation or thought. He smokes about half a pack of cigarettes per day, trying to wean down. He denies any complaint today. Patient has No headache, No chest pain, No abdominal pain - No Nausea, No new weakness tingling or numbness, No Cough - SOB.  No problems updated.  ALLERGIES: Allergies  Allergen Reactions  . Ibuprofen Anaphylaxis, Swelling and Other (See Comments)    Dehydration Swelling of the "moist areas" (throat, mouth)  . Penicillins Hives  . Latex Rash    PAST MEDICAL HISTORY: Past Medical History  Diagnosis Date  . Hypertension   . HIV (human immunodeficiency virus infection) ([place])     UNDER CONTROL WITH MEDICATIONS  . Immune deficiency disorder ([place])   . Bronchitis     LAST FLARE UP WAS NEW YEARS 2015  . Renal mass, right   . GERD (gastroesophageal reflux disease)     NO MEDS  . Cancer ([place])   . Renal insufficiency 05/17/2015    MEDICATIONS AT HOME: Prior to Admission medications   Medication Sig Start Date End Date Taking? Authorizing Provider  Abacavir-Dolutegravir-Lamivud [id number] MG TABS Take 1 tablet by mouth daily. 05/25/15  Yes Michel Bickers, MD  amLODipine (NORVASC) 5 MG tablet Take 1 tablet (5 mg total) by mouth at bedtime. 06/02/15  Yes  Tresa Garter, MD  ATRIPLA 600-200-300 MG tablet TK 1 T PO HS 05/06/15   Historical Provider, MD     Objective:   Filed Vitals:   06/02/15 1446  BP: 116/82  Pulse: 89  Temp: 98.3 F (36.8 C)  TempSrc: Oral  Resp: 18  Height: 5\' 6"  (1.676 m)  Weight: 197 lb 12.8 oz (89.721 kg)  SpO2: 99%    Exam General appearance : Awake, alert, not in any distress. Speech Clear. Not toxic looking HEENT: Atraumatic and Normocephalic, pupils equally reactive to light and accomodation Neck: supple, no JVD. No cervical lymphadenopathy.  Chest:Good air entry bilaterally, no added sounds  CVS: S1 S2 regular, no murmurs.  Abdomen: Bowel sounds present, Non tender and not distended with no gaurding, rigidity or rebound. Extremities: B/L Lower Ext shows no edema, both legs are warm to touch Neurology: Awake alert, and oriented X 3, CN II-XII intact, Non focal Skin:No Rash  Data Review No results found for: HGBA1C   Assessment & Plan   1. Essential hypertension  - amLODipine (NORVASC) 5 MG tablet; Take 1 tablet (5 mg total) by mouth at bedtime.  Dispense: 90 tablet; Refill: 3  We have discussed target BP range and blood pressure goal. I have advised patient to check BP regularly and to call us back or report to clinic if the numbers are consistently higher than 140/90. We discussed the importance of compliance with medical  therapy and DASH diet recommended, consequences of uncontrolled hypertension discussed.   - continue current BP medications  2. HIV disease ([place])  Continue current medications as prescribed by infectious disease Follow-up with infectious disease specialist as scheduled  Patient have been counseled extensively about nutrition and exercise  Return in about 6 months (around 11/30/2015) for Follow up HTN.  The patient was given clear instructions to go to ER or return to medical center if symptoms don't improve, worsen or new problems develop. The patient verbalized  understanding. The patient was told to call to get lab results if they haven't heard anything in the next week.   This note has been created with Surveyor, quantity. Any transcriptional errors are unintentional.    Angelica Chessman, MD, [place], [place], [place], [place] and [place], [place]   06/02/2015, 3:23 PM

## 2015-06-16 ENCOUNTER — Ambulatory Visit: Payer: Self-pay | Admitting: Internal Medicine

## 2015-06-30 ENCOUNTER — Ambulatory Visit: Payer: Self-pay | Admitting: Internal Medicine

## 2015-08-04 ENCOUNTER — Emergency Department (HOSPITAL_COMMUNITY): Payer: Self-pay

## 2015-08-04 ENCOUNTER — Emergency Department (HOSPITAL_COMMUNITY)
Admission: EM | Admit: 2015-08-04 | Discharge: 2015-08-04 | Disposition: A | Discharge status: Home / Self Care | Payer: Self-pay | Attending: Emergency Medicine | Admitting: Emergency Medicine

## 2015-08-04 ENCOUNTER — Encounter (HOSPITAL_COMMUNITY): Payer: Self-pay | Admitting: Emergency Medicine

## 2015-08-04 DIAGNOSIS — J40 Bronchitis, not specified as acute or chronic: Secondary | ICD-10-CM

## 2015-08-04 DIAGNOSIS — Z8719 Personal history of other diseases of the digestive system: Secondary | ICD-10-CM | POA: Insufficient documentation

## 2015-08-04 DIAGNOSIS — Z720 Tobacco use: Secondary | ICD-10-CM

## 2015-08-04 DIAGNOSIS — R05 Cough: Secondary | ICD-10-CM

## 2015-08-04 DIAGNOSIS — I10 Essential (primary) hypertension: Secondary | ICD-10-CM | POA: Insufficient documentation

## 2015-08-04 DIAGNOSIS — J209 Acute bronchitis, unspecified: Secondary | ICD-10-CM | POA: Insufficient documentation

## 2015-08-04 DIAGNOSIS — F17210 Nicotine dependence, cigarettes, uncomplicated: Secondary | ICD-10-CM | POA: Insufficient documentation

## 2015-08-04 DIAGNOSIS — Z862 Personal history of diseases of the blood and blood-forming organs and certain disorders involving the immune mechanism: Secondary | ICD-10-CM | POA: Insufficient documentation

## 2015-08-04 DIAGNOSIS — Z79899 Other long term (current) drug therapy: Secondary | ICD-10-CM | POA: Insufficient documentation

## 2015-08-04 DIAGNOSIS — R059 Cough, unspecified: Secondary | ICD-10-CM

## 2015-08-04 DIAGNOSIS — R52 Pain, unspecified: Secondary | ICD-10-CM

## 2015-08-04 DIAGNOSIS — Z859 Personal history of malignant neoplasm, unspecified: Secondary | ICD-10-CM | POA: Insufficient documentation

## 2015-08-04 DIAGNOSIS — J069 Acute upper respiratory infection, unspecified: Secondary | ICD-10-CM | POA: Insufficient documentation

## 2015-08-04 DIAGNOSIS — Z880 Allergy status to penicillin: Secondary | ICD-10-CM | POA: Insufficient documentation

## 2015-08-04 DIAGNOSIS — N289 Disorder of kidney and ureter, unspecified: Secondary | ICD-10-CM | POA: Insufficient documentation

## 2015-08-04 DIAGNOSIS — R112 Nausea with vomiting, unspecified: Secondary | ICD-10-CM | POA: Insufficient documentation

## 2015-08-04 DIAGNOSIS — R197 Diarrhea, unspecified: Secondary | ICD-10-CM | POA: Insufficient documentation

## 2015-08-04 DIAGNOSIS — Z91040 Latex allergy status: Secondary | ICD-10-CM | POA: Insufficient documentation

## 2015-08-04 DIAGNOSIS — Z21 Asymptomatic human immunodeficiency virus [HIV] infection status: Secondary | ICD-10-CM | POA: Insufficient documentation

## 2015-08-04 LAB — URINALYSIS, ROUTINE W REFLEX MICROSCOPIC
BILIRUBIN URINE: NEGATIVE
GLUCOSE, UA: NEGATIVE mg/dL
HGB URINE DIPSTICK: NEGATIVE
Ketones, ur: NEGATIVE mg/dL
Leukocytes, UA: NEGATIVE
Nitrite: NEGATIVE
Protein, ur: NEGATIVE mg/dL
SPECIFIC GRAVITY, URINE: 1.03 (ref 1.005–1.030)
pH: 6 (ref 5.0–8.0)

## 2015-08-04 LAB — COMPREHENSIVE METABOLIC PANEL
ALBUMIN: 3.9 g/dL (ref 3.5–5.0)
ALK PHOS: 83 U/L (ref 38–126)
ALT: 26 U/L (ref 17–63)
AST: 22 U/L (ref 15–41)
Anion gap: 9 (ref 5–15)
BUN: 10 mg/dL (ref 6–20)
CALCIUM: 9 mg/dL (ref 8.9–10.3)
CHLORIDE: 110 mmol/L (ref 101–111)
CO2: 23 mmol/L (ref 22–32)
CREATININE: 1.36 mg/dL — AB (ref 0.61–1.24)
GFR calc Af Amer: >60 mL/min (ref >60)
GFR calc non Af Amer: >60 mL/min (ref >60)
GLUCOSE: 130 mg/dL — AB (ref 65–99)
Potassium: 3.5 mmol/L (ref 3.5–5.1)
SODIUM: 142 mmol/L (ref 135–145)
Total Bilirubin: 0.3 mg/dL (ref 0.3–1.2)
Total Protein: 6.8 g/dL (ref 6.5–8.1)

## 2015-08-04 LAB — CBC
HCT: 41 % (ref 39.0–52.0)
Hemoglobin: 14.2 g/dL (ref 13.0–17.0)
MCH: 33 pg (ref 26.0–34.0)
MCHC: 34.6 g/dL (ref 30.0–36.0)
MCV: 95.3 fL (ref 78.0–100.0)
PLATELETS: 177 10*3/uL (ref 150–400)
RBC: 4.3 MIL/uL (ref 4.22–5.81)
RDW: 13.6 % (ref 11.5–15.5)
WBC: 5.2 10*3/uL (ref 4.0–10.5)

## 2015-08-04 LAB — RAPID STREP SCREEN (MED CTR MEBANE ONLY): Streptococcus, Group A Screen (Direct): NEGATIVE

## 2015-08-04 MED ORDER — ONDANSETRON HCL 8 MG PO TABS
8.0000 mg | ORAL_TABLET | Freq: Three times a day (TID) | ORAL | Status: DC | PRN
Start: 2015-08-04 — End: 2015-10-12

## 2015-08-04 MED ORDER — AZITHROMYCIN 250 MG PO TABS
ORAL_TABLET | ORAL | Status: DC
Start: 2015-08-04 — End: 2015-10-12

## 2015-08-04 NOTE — ED Notes (Signed)
Patient was alert, oriented and stable upon discharge. RN went over AVS and patient had no further questions.  

## 2015-08-04 NOTE — Discharge Instructions (Signed)
Continue to stay well-hydrated. Gargle warm salt water and spit it out. Continue to alternate between Tylenol and Ibuprofen for pain or fever. Use Mucinex for cough suppression/expectoration of mucus. Use netipot and flonase to help with nasal congestion. May consider over-the-counter Benadryl or other antihistamine to decrease secretions and for watery itchy eyes. Take antibiotic as directed for your bronchitis. STOP SMOKING! Use zofran as needed for nausea. Followup with your primary care doctor in 5-7 days for recheck of ongoing symptoms. Return to emergency department for emergent changing or worsening of symptoms.   Cough, Adult Coughing is a reflex that clears your throat and your airways. Coughing helps to heal and protect your lungs. It is normal to cough occasionally, but a cough that happens with other symptoms or lasts a long time may be a sign of a condition that needs treatment. A cough may last only 2-3 weeks (acute), or it may last longer than 8 weeks (chronic). CAUSES Coughing is commonly caused by:  Breathing in substances that irritate your lungs.  A viral or bacterial respiratory infection.  Allergies.  Asthma.  Postnasal drip.  Smoking.  Acid backing up from the stomach into the esophagus (gastroesophageal reflux).  Certain medicines.  Chronic lung problems, including COPD (or rarely, lung cancer).  Other medical conditions such as heart failure. HOME CARE INSTRUCTIONS  Pay attention to any changes in your symptoms. Take these actions to help with your discomfort:  Take medicines only as told by your health care provider.  If you were prescribed an antibiotic medicine, take it as told by your health care provider. Do not stop taking the antibiotic even if you start to feel better.  Talk with your health care provider before you take a cough suppressant medicine.  Drink enough fluid to keep your urine clear or pale yellow.  If the air is dry, use a cold steam  vaporizer or humidifier in your bedroom or your home to help loosen secretions.  Avoid anything that causes you to cough at work or at home.  If your cough is worse at night, try sleeping in a semi-upright position.  Avoid cigarette smoke. If you smoke, quit smoking. If you need help quitting, ask your health care provider.  Avoid caffeine.  Avoid alcohol.  Rest as needed. SEEK MEDICAL CARE IF:   You have new symptoms.  You cough up pus.  Your cough does not get better after 2-3 weeks, or your cough gets worse.  You cannot control your cough with suppressant medicines and you are losing sleep.  You develop pain that is getting worse or pain that is not controlled with pain medicines.  You have a fever.  You have unexplained weight loss.  You have night sweats. SEEK IMMEDIATE MEDICAL CARE IF:  You cough up blood.  You have difficulty breathing.  Your heartbeat is very fast.   This information is not intended to replace advice given to you by your health care provider. Make sure you discuss any questions you have with your health care provider.   Document Released: 01/12/2011 Document Revised: 04/06/2015 Document Reviewed: 09/22/2014 Elsevier Interactive Patient Education 2016 [place] Choices to Help Relieve Diarrhea, Adult When you have diarrhea, the foods you eat and your eating habits are very important. Choosing the right foods and drinks can help relieve diarrhea. Also, because diarrhea can last up to 7 days, you need to replace lost fluids and electrolytes (such as sodium, potassium, and chloride) in order to help  prevent dehydration.  WHAT GENERAL GUIDELINES DO I NEED TO FOLLOW?  Slowly drink 1 cup (8 oz) of fluid for each episode of diarrhea. If you are getting enough fluid, your urine will be clear or pale yellow.  Eat starchy foods. Some good choices include white rice, white toast, pasta, low-fiber cereal, baked potatoes (without the skin),  saltine crackers, and bagels.  Avoid large servings of any cooked vegetables.  Limit fruit to two servings per day. A serving is  cup or 1 small piece.  Choose foods with less than 2 g of fiber per serving.  Limit fats to less than 8 tsp (38 g) per day.  Avoid fried foods.  Eat foods that have probiotics in them. Probiotics can be found in certain dairy products.  Avoid foods and beverages that may increase the speed at which food moves through the stomach and intestines (gastrointestinal tract). Things to avoid include:  High-fiber foods, such as dried fruit, raw fruits and vegetables, nuts, seeds, and whole grain foods.  Spicy foods and high-fat foods.  Foods and beverages sweetened with high-fructose corn syrup, honey, or sugar alcohols such as xylitol, sorbitol, and mannitol. WHAT FOODS ARE RECOMMENDED? Grains White rice. White, [place], or pita breads (fresh or toasted), including plain rolls, buns, or bagels. White pasta. Saltine, soda, or graham crackers. Pretzels. Low-fiber cereal. Cooked cereals made with water (such as cornmeal, farina, or cream cereals). Plain muffins. Matzo. Melba toast. Zwieback.  Vegetables Potatoes (without the skin). Strained tomato and vegetable juices. Most well-cooked and canned vegetables without seeds. Tender lettuce. Fruits Cooked or canned applesauce, apricots, cherries, fruit cocktail, grapefruit, peaches, pears, or plums. Fresh bananas, apples without skin, cherries, grapes, cantaloupe, grapefruit, peaches, oranges, or plums.  Meat and Other Protein Products Baked or boiled chicken. Eggs. Tofu. Fish. Seafood. Smooth peanut butter. Ground or well-cooked tender beef, ham, veal, lamb, pork, or poultry.  Dairy Plain yogurt, kefir, and unsweetened liquid yogurt. Lactose-free milk, buttermilk, or soy milk. Plain hard cheese. Beverages Sport drinks. Clear broths. Diluted fruit juices (except prune). Regular, caffeine-free sodas such as ginger ale.  Water. Decaffeinated teas. Oral rehydration solutions. Sugar-free beverages not sweetened with sugar alcohols. Other Bouillon, broth, or soups made from recommended foods.  The items listed above may not be a complete list of recommended foods or beverages. Contact your dietitian for more options. WHAT FOODS ARE NOT RECOMMENDED? Grains Whole grain, whole wheat, bran, or rye breads, rolls, pastas, crackers, and cereals. Wild or brown rice. Cereals that contain more than 2 g of fiber per serving. Corn tortillas or taco shells. Cooked or dry oatmeal. Granola. Popcorn. Vegetables Raw vegetables. Cabbage, broccoli, Brussels sprouts, artichokes, baked beans, beet greens, corn, kale, legumes, peas, sweet potatoes, and yams. Potato skins. Cooked spinach and cabbage. Fruits Dried fruit, including raisins and dates. Raw fruits. Stewed or dried prunes. Fresh apples with skin, apricots, mangoes, pears, raspberries, and strawberries.  Meat and Other Protein Products Chunky peanut butter. Nuts and seeds. Beans and lentils. Berniece Salines.  Dairy High-fat cheeses. Milk, chocolate milk, and beverages made with milk, such as milk shakes. Cream. Ice cream. Sweets and Desserts Sweet rolls, doughnuts, and sweet breads. Pancakes and waffles. Fats and Oils Butter. Cream sauces. Margarine. Salad oils. Plain salad dressings. Olives. Avocados.  Beverages Caffeinated beverages (such as coffee, tea, soda, or energy drinks). Alcoholic beverages. Fruit juices with pulp. Prune juice. Soft drinks sweetened with high-fructose corn syrup or sugar alcohols. Other Coconut. Hot sauce. Chili powder. Mayonnaise. Gravy. Cream-based or milk-based  soups.  The items listed above may not be a complete list of foods and beverages to avoid. Contact your dietitian for more information. WHAT SHOULD I DO IF I BECOME DEHYDRATED? Diarrhea can sometimes lead to dehydration. Signs of dehydration include dark urine and dry mouth and skin. If you think  you are dehydrated, you should rehydrate with an oral rehydration solution. These solutions can be purchased at pharmacies, retail stores, or online.  Drink -1 cup (120-240 mL) of oral rehydration solution each time you have an episode of diarrhea. If drinking this amount makes your diarrhea worse, try drinking smaller amounts more often. For example, drink 1-3 tsp (5-15 mL) every 5-10 minutes.  A general rule for staying hydrated is to drink 1-2 L of fluid per day. Talk to your health care provider about the specific amount you should be drinking each day. Drink enough fluids to keep your urine clear or pale yellow.   This information is not intended to replace advice given to you by your health care provider. Make sure you discuss any questions you have with your health care provider.   Document Released: 10/06/2003 Document Revised: 08/06/2014 Document Reviewed: 06/08/2013 Elsevier Interactive Patient Education 2016 Else[employer] Inc[employer]  Nausea and Vomiting Nausea is a sick feeling that often comes before throwing up (vomiting). Vomiting is a reflex where stomach contents come out of your mouth. Vomiting can cause severe loss of body fluids (dehydration). Children and elderly adults can become dehydrated quickly, especially if they also have diarrhea. Nausea and vomiting are symptoms of a condition or disease. It is important to find the cause of your symptoms. CAUSES   Direct irritation of the stomach lining. This irritation can result from increased acid production (gastroesophageal reflux disease), infection, food poisoning, taking certain medicines (such as nonsteroidal anti-inflammatory drugs), alcohol use, or tobacco use.  Signals from the brain.These signals could be caused by a headache, heat exposure, an inner ear disturbance, increased pressure in the brain from injury, infection, a tumor, or a concussion, pain, emotional stimulus, or metabolic problems.  An obstruction in the  gastrointestinal tract (bowel obstruction).  Illnesses such as diabetes, hepatitis, gallbladder problems, appendicitis, kidney problems, cancer, sepsis, atypical symptoms of a heart attack, or eating disorders.  Medical treatments such as chemotherapy and radiation.  Receiving medicine that makes you sleep (general anesthetic) during surgery. DIAGNOSIS Your caregiver may ask for tests to be done if the problems do not improve after a few days. Tests may also be done if symptoms are severe or if the reason for the nausea and vomiting is not clear. Tests may include:  Urine tests.  Blood tests.  Stool tests.  Cultures (to look for evidence of infection).  X-rays or other imaging studies. Test results can help your caregiver make decisions about treatment or the need for additional tests. TREATMENT You need to stay well hydrated. Drink frequently but in small amounts.You may wish to drink water, sports drinks, clear broth, or eat frozen ice pops or gelatin dessert to help stay hydrated.When you eat, eating slowly may help prevent nausea.There are also some antinausea medicines that may help prevent nausea. HOME CARE INSTRUCTIONS   Take all medicine as directed by your caregiver.  If you do not have an appetite, do not force yourself to eat. However, you must continue to drink fluids.  If you have an appetite, eat a normal diet unless your caregiver tells you differently.  Eat a variety of complex carbohydrates (rice, wheat, potatoes, bread),  lean meats, yogurt, fruits, and vegetables.  Avoid high-fat foods because they are more difficult to digest.  Drink enough water and fluids to keep your urine clear or pale yellow.  If you are dehydrated, ask your caregiver for specific rehydration instructions. Signs of dehydration may include:  Severe thirst.  Dry lips and mouth.  Dizziness.  Dark urine.  Decreasing urine frequency and amount.  Confusion.  Rapid breathing or  pulse. SEEK IMMEDIATE MEDICAL CARE IF:   You have blood or brown flecks (like coffee grounds) in your vomit.  You have black or bloody stools.  You have a severe headache or stiff neck.  You are confused.  You have severe abdominal pain.  You have chest pain or trouble breathing.  You do not urinate at least once every 8 hours.  You develop cold or clammy skin.  You continue to vomit for longer than 24 to 48 hours.  You have a fever. MAKE SURE YOU:   Understand these instructions.  Will watch your condition.  Will get help right away if you are not doing well or get worse.   This information is not intended to replace advice given to you by your health care provider. Make sure you discuss any questions you have with your health care provider.   Document Released: 07/16/2005 Document Revised: 10/08/2011 Document Reviewed: 12/13/2010 Elsevier Interactive Patient Education 2016 [employer].  Smoking Cessation, Tips for Success If you are ready to quit smoking, congratulations! You have chosen to help yourself be healthier. Cigarettes bring nicotine, tar, carbon monoxide, and other irritants into your body. Your lungs, heart, and blood vessels will be able to work better without these poisons. There are many different ways to quit smoking. Nicotine gum, nicotine patches, a nicotine inhaler, or nicotine nasal spray can help with physical craving. Hypnosis, support groups, and medicines help break the habit of smoking. WHAT THINGS CAN I DO TO MAKE QUITTING EASIER?  Here are some tips to help you quit for good:  Pick a date when you will quit smoking completely. Tell all of your friends and family about your plan to quit on that date.  Do not try to slowly cut down on the number of cigarettes you are smoking. Pick a quit date and quit smoking completely starting on that day.  Throw away all cigarettes.   Clean and remove all ashtrays from your home, work, and car.  On a  card, write down your reasons for quitting. Carry the card with you and read it when you get the urge to smoke.  Cleanse your body of nicotine. Drink enough water and fluids to keep your urine clear or pale yellow. Do this after quitting to flush the nicotine from your body.  Learn to predict your moods. Do not let a bad situation be your excuse to have a cigarette. Some situations in your life might tempt you into wanting a cigarette.  Never have "just one" cigarette. It leads to wanting another and another. Remind yourself of your decision to quit.  Change habits associated with smoking. If you smoked while driving or when feeling stressed, try other activities to replace smoking. Stand up when drinking your coffee. Brush your teeth after eating. Sit in a different chair when you read the paper. Avoid alcohol while trying to quit, and try to drink fewer caffeinated beverages. Alcohol and caffeine may urge you to smoke.  Avoid foods and drinks that can trigger a desire to smoke, such as sugary  or spicy foods and alcohol.  Ask people who smoke not to smoke around you.  Have something planned to do right after eating or having a cup of coffee. For example, plan to take a walk or exercise.  Try a relaxation exercise to calm you down and decrease your stress. Remember, you may be tense and nervous for the first 2 weeks after you quit, but this will pass.  Find new activities to keep your hands busy. Play with a pen, coin, or rubber band. Doodle or draw things on paper.  Brush your teeth right after eating. This will help cut down on the craving for the taste of tobacco after meals. You can also try mouthwash.   Use oral substitutes in place of cigarettes. Try using lemon drops, carrots, cinnamon sticks, or chewing gum. Keep them handy so they are available when you have the urge to smoke.  When you have the urge to smoke, try deep breathing.  Designate your home as a nonsmoking area.  If  you are a heavy smoker, ask your health care provider about a prescription for nicotine chewing gum. It can ease your withdrawal from nicotine.  Reward yourself. Set aside the cigarette money you save and buy yourself something nice.  Look for support from others. Join a support group or smoking cessation program. Ask someone at home or at work to help you with your plan to quit smoking.  Always ask yourself, "Do I need this cigarette or is this just a reflex?" Tell yourself, "Today, I choose not to smoke," or "I do not want to smoke." You are reminding yourself of your decision to quit.  Do not replace cigarette smoking with electronic cigarettes (commonly called e-cigarettes). The safety of e-cigarettes is unknown, and some may contain harmful chemicals.  If you relapse, do not give up! Plan ahead and think about what you will do the next time you get the urge to smoke. HOW WILL I FEEL WHEN I QUIT SMOKING? You may have symptoms of withdrawal because your body is used to nicotine (the addictive substance in cigarettes). You may crave cigarettes, be irritable, feel very hungry, cough often, get headaches, or have difficulty concentrating. The withdrawal symptoms are only temporary. They are strongest when you first quit but will go away within 10-14 days. When withdrawal symptoms occur, stay in control. Think about your reasons for quitting. Remind yourself that these are signs that your body is healing and getting used to being without cigarettes. Remember that withdrawal symptoms are easier to treat than the major diseases that smoking can cause.  Even after the withdrawal is over, expect periodic urges to smoke. However, these cravings are generally short lived and will go away whether you smoke or not. Do not smoke! WHAT RESOURCES ARE AVAILABLE TO HELP ME QUIT SMOKING? Your health care provider can direct you to community resources or hospitals for support, which may include:  Group  support.  Education.  Hypnosis.  Therapy.   This information is not intended to replace advice given to you by your health care provider. Make sure you discuss any questions you have with your health care provider.   Document Released: 04/13/2004 Document Revised: 08/06/2014 Document Reviewed: 01/01/2013 Elsevier Interactive Patient Education 2016 Elsevier Inc.  Upper Respiratory Infection, Adult Most upper respiratory infections (URIs) are caused by a virus. A URI affects the nose, throat, and upper air passages. The most common type of URI is often called "the common cold." HOME CARE  Take medicines only as told by your doctor.  Gargle warm saltwater or take cough drops to comfort your throat as told by your doctor.  Use a warm mist humidifier or inhale steam from a shower to increase air moisture. This may make it easier to breathe.  Drink enough fluid to keep your pee (urine) clear or pale yellow.  Eat soups and other clear broths.  Have a healthy diet.  Rest as needed.  Go back to work when your fever is gone or your doctor says it is okay.  You may need to stay home longer to avoid giving your URI to others.  You can also wear a face mask and wash your hands often to prevent spread of the virus.  Use your inhaler more if you have asthma.  Do not use any tobacco products, including cigarettes, chewing tobacco, or electronic cigarettes. If you need help quitting, ask your doctor. GET HELP IF:  You are getting worse, not better.  Your symptoms are not helped by medicine.  You have chills.  You are getting more short of breath.  You have brown or red mucus.  You have yellow or brown discharge from your nose.  You have pain in your face, especially when you bend forward.  You have a fever.  You have puffy (swollen) neck glands.  You have pain while swallowing.  You have white areas in the back of your throat. GET HELP RIGHT AWAY IF:   You have very  bad or constant:  Headache.  Ear pain.  Pain in your forehead, behind your eyes, and over your cheekbones (sinus pain).  Chest pain.  You have long-lasting (chronic) lung disease and any of the following:  Wheezing.  Long-lasting cough.  Coughing up blood.  A change in your usual mucus.  You have a stiff neck.  You have changes in your:  Vision.  Hearing.  Thinking.  Mood. MAKE SURE YOU:   Understand these instructions.  Will watch your condition.  Will get help right away if you are not doing well or get worse.   This information is not intended to replace advice given to you by your health care provider. Make sure you discuss any questions you have with your health care provider.   Document Released: 01/02/2008 Document Revised: 11/30/2014 Document Reviewed: 10/21/2013 Elsevier Interactive Patient Education [employer].

## 2015-08-04 NOTE — ED Notes (Signed)
Per pt, states congestion, vomiting and diarrhea since Monday

## 2015-08-04 NOTE — ED Provider Notes (Signed)
CSN: [id number]     Arrival date & time 08/04/15  1301 History   First MD Initiated Contact with Patient 08/04/15 1659     Chief Complaint  Patient presents with  . vomiting/diarrhea      (Consider location/radiation/quality/duration/timing/severity/associated sxs/prior Treatment) HPI Comments: Drew Cuevas is a 45 y.o. male with a PMHx of HIV, HTN, bronchitis, GERD, renal cell carcinoma s/p nephrectomy, and renal insufficiency, who presents to the ED with complaints of URI symptoms 3 days. Patient states that he has had chills, body aches, clear yellowish rhinorrhea, sinus and chest congestion, and cough with yellow sputum production as well as a sore throat for the last 3 days. Yesterday he had some nausea and 1 episode of nonbloody nonbilious emesis, and 2 episodes of nonbloody diarrhea, but he states that today the symptoms resolved. 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 He is a smoker. He states he has been trying TheraFlu and Tylenol with some relief, no known aggravating factors for his symptoms.  He denies any fevers, drooling, trismus, ear pain or drainage, eye itching or redness, eye drainage, wheezing, chest pain, shortness breath, abdominal pain, constipation, obstipation, melena, hematochezia, hematemesis, ongoing nausea or vomiting, ongoing diarrhea, dysuria, hematuria, numbness, tingling, or focal weakness. Compliant with his HIV regimen. Last CD4 count 770 on 03/30/15. Follows closely with Dr. Megan Salon of infectious disease.  Patient is a 45 y.o. male presenting with URI. The history is provided by the patient and medical records. No language interpreter was used.  URI Presenting symptoms: congestion, cough, rhinorrhea and sore throat   Presenting symptoms: no ear pain and no fever   Severity:  Moderate Onset quality:  Gradual Duration:  3 days Timing:  Constant Progression:  Unchanged Chronicity:  New Relieved by:  OTC  medications Worsened by:  Nothing tried Ineffective treatments:  None tried Associated symptoms: myalgias (body aches)   Associated symptoms: no arthralgias and no wheezing   Risk factors: chronic kidney disease, immunosuppression and sick contacts     Past Medical History  Diagnosis Date  . Hypertension   . HIV (human immunodeficiency virus infection) ([place])     UNDER CONTROL WITH MEDICATIONS  . Immune deficiency disorder ([place])   . Bronchitis     LAST FLARE UP WAS NEW YEARS 2015  . Renal mass, right   . GERD (gastroesophageal reflux disease)     NO MEDS  . Cancer ([place])   . Renal insufficiency 05/17/2015   Past Surgical History  Procedure Laterality Date  . No past surgeries    . Robot assisted laparoscopic nephrectomy Right 08/19/2013    Procedure: ROBOTIC ASSISTED LAPAROSCOPIC RIGHT PARTIAL NEPHRECTOMY;  Surgeon: Ardis Hughs, MD;  Location: [facility];  Service: Urology;  Laterality: Right;   Family History  Problem Relation Age of Onset  . Hypertension Mother   . Hypertension Sister   . Hypertension Brother    Social History  Substance Use Topics  . Smoking status: Heavy Tobacco Smoker -- 0.50 packs/day    Types: Cigarettes  . Smokeless tobacco: Never Used  . Alcohol Use: No    Review of Systems  Constitutional: Positive for chills. Negative for fever.  HENT: Positive for congestion, rhinorrhea, sinus pressure and sore throat. Negative for drooling, ear discharge, ear pain and trouble swallowing.   Eyes: Negative for pain, discharge, redness and itching.  Respiratory: Positive for cough. Negative for shortness of breath and wheezing.   Cardiovascular: Negative for chest  pain.  Gastrointestinal: Positive for nausea (now resolved), vomiting and diarrhea. Negative for abdominal pain and constipation.  Genitourinary: Negative for dysuria and hematuria.  Musculoskeletal: Positive for myalgias (body aches). Negative for arthralgias.  Skin: Negative for color change.   Allergic/Immunologic: Positive for immunocompromised state (HIV).  Neurological: Negative for weakness and numbness.  Psychiatric/Behavioral: Negative for confusion.   10 Systems reviewed and are negative for acute change except as noted in the HPI.    Allergies  Ibuprofen; Penicillins; and Latex  Home Medications   Prior to Admission medications   Medication Sig Start Date End Date Taking? Authorizing Provider  Abacavir-Dolutegravir-Lamivud [id number] MG TABS Take 1 tablet by mouth daily. 05/25/15   Michel Bickers, MD  amLODipine (NORVASC) 5 MG tablet Take 1 tablet (5 mg total) by mouth at bedtime. 06/02/15   Tresa Garter, MD  ATRIPLA 600-200-300 MG tablet TK 1 T PO HS 05/06/15   Historical Provider, MD   BP 138/92 mmHg  Pulse 84  Temp(Src) 98.2 F (36.8 C) (Oral)  Resp 18  SpO2 100% Physical Exam  Constitutional: He is oriented to person, place, and time. Vital signs are normal. He appears well-developed and well-nourished.  Non-toxic appearance. No distress.  Afebrile, nontoxic, NAD  HENT:  Head: Normocephalic and atraumatic.  Right Ear: Hearing, tympanic membrane, external ear and ear canal normal.  Left Ear: Hearing, tympanic membrane, external ear and ear canal normal.  Nose: Mucosal edema and rhinorrhea present.  Mouth/Throat: Uvula is midline and mucous membranes are normal. No trismus in the jaw. No uvula swelling. Oropharyngeal exudate, posterior oropharyngeal edema and posterior oropharyngeal erythema present. No tonsillar abscesses.  Ears are clear bilaterally. Nose with mucosal edema and clear rhinorrhea. Oropharynx without uvular swelling or deviation, no trismus or drooling, with 1+ b/l tonsillar swelling and erythema, +exudate noted on L tonsil, no PTA noted  Eyes: Conjunctivae and EOM are normal. Right eye exhibits no discharge. Left eye exhibits no discharge.  Neck: Normal range of motion. Neck supple.  Cardiovascular: Normal rate, regular rhythm, normal  heart sounds and intact distal pulses.  Exam reveals no gallop and no friction rub.   No murmur heard. Pulmonary/Chest: Effort normal and breath sounds normal. No respiratory distress. He has no decreased breath sounds. He has no wheezes. He has no rhonchi. He has no rales.  CTAB in all lung fields, no w/r/r, no hypoxia or increased WOB, speaking in full sentences, SpO2 100% on RA   Abdominal: Soft. Normal appearance and bowel sounds are normal. He exhibits no distension. There is no tenderness. There is no rigidity, no rebound, no guarding, no CVA tenderness, no tenderness at McBurney's point and negative Murphy's sign.  Soft, NTND, +BS throughout, no r/g/r, neg murphy's, neg mcburney's, no CVA TTP   Musculoskeletal: Normal range of motion.  Lymphadenopathy:       Head (right side): No submandibular and no tonsillar adenopathy present.       Head (left side): No submandibular and no tonsillar adenopathy present.    He has cervical adenopathy.  Shotty b/l cervical LAD which is mildly TTP  Neurological: He is alert and oriented to person, place, and time. He has normal strength. No sensory deficit.  Skin: Skin is warm, dry and intact. No rash noted.  Psychiatric: He has a normal mood and affect.  Nursing note and vitals reviewed.   ED Course  Procedures (including critical care time) Labs Review Labs Reviewed  COMPREHENSIVE METABOLIC PANEL - Abnormal; Notable for the following:  Glucose, Bld 130 (*)    Creatinine, Ser 1.36 (*)    All other components within normal limits  RAPID STREP SCREEN (NOT AT [facility])  CULTURE, GROUP A STREP  CBC  URINALYSIS, ROUTINE W REFLEX MICROSCOPIC (NOT AT [facility])    Imaging Review Dg Chest 2 View  08/04/2015  CLINICAL DATA:  Congestion, vomiting, and diarrhea since Monday, history HIV, hypertension, smoker EXAM: CHEST  2 VIEW COMPARISON:  03/30/2015 FINDINGS: Normal heart size, mediastinal contours, and pulmonary vascularity. Mild chronic bronchitic changes.  Lungs otherwise clear. No pleural effusion or pneumothorax. No acute bony abnormalities. IMPRESSION: Chronic bronchitic changes without infiltrate. Electronically Signed   By: Lavonia Dana M.D.   On: 08/04/2015 17:57   I have personally reviewed and evaluated these images and lab results as part of my medical decision-making.   EKG Interpretation None      MDM   Final diagnoses:  URI (upper respiratory infection)  Bronchitis  Cough  Tobacco use  Renal insufficiency  Nausea vomiting and diarrhea  Body aches    45 y.o. male here with URI symptoms x3 days. +sick contacts at work. Cough with yellow sputum production. Pt very complaint with HIV meds, CD4 last done on 03/30/15 and was 770. Had some n/v/d yesterday but resolved today. On abd tenderness. CBC and CMP WNL with baseline kidney function. Pt afebrile with clear lung exam. Declines anything for pain or nausea. +Smoker. Will obtain CXR and RST to ensure no bacterial etiology or PNA found, and await U/A results which were collected in triage. Will reassess shortly.   6:43 PM RST neg. U/A neg. CXR showing chronic bronchitic changes. Given HIV disease, will treat empirically for bronchitis, will give zpak. Will also give him zofran in case he becomes nauseated, not nauseas here, likely from postnasal drip. OTC remedies discussed. Will have him f/up with PCP in 1wk. Discussed BRAT diet. Tobacco cessation discussed. I explained the diagnosis and have given explicit precautions to return to the ER including for any other new or worsening symptoms. The patient understands and accepts the medical plan as it's been dictated and I have answered their questions. Discharge instructions concerning home care and prescriptions have been given. The patient is STABLE and is discharged to home in good condition.  BP 133/89 mmHg  Pulse 73  Temp(Src) 98.2 F (36.8 C) (Oral)  Resp 16  SpO2 99%  Meds ordered this encounter  Medications  . azithromycin  (ZITHROMAX Z-PAK) 250 MG tablet    Sig: 2 po day one, then 1 daily x 4 days    Dispense:  5 tablet    Refill:  0    Order Specific Question:  Supervising Provider    Answer:  MILLER, BRIAN [[id number]]  . ondansetron (ZOFRAN) 8 MG tablet    Sig: Take 1 tablet (8 mg total) by mouth every 8 (eight) hours as needed for nausea or vomiting.    Dispense:  10 tablet    Refill:  0    Order Specific Question:  Supervising Provider    Answer:  Noemi Chapel [[id number]]     Kiriana Worthington Camprubi-Soms, PA-C 08/04/15 1844  Merrily Pew, MD 08/05/15 2316

## 2015-08-07 LAB — CULTURE, GROUP A STREP: Strep A Culture: NEGATIVE

## 2015-10-11 ENCOUNTER — Encounter (HOSPITAL_COMMUNITY): Payer: Self-pay | Admitting: Emergency Medicine

## 2015-10-11 ENCOUNTER — Emergency Department (HOSPITAL_COMMUNITY): Payer: Self-pay

## 2015-10-11 ENCOUNTER — Emergency Department (HOSPITAL_COMMUNITY)
Admission: EM | Admit: 2015-10-11 | Discharge: 2015-10-12 | Disposition: A | Discharge status: Home / Self Care | Payer: Self-pay | Attending: Emergency Medicine | Admitting: Emergency Medicine

## 2015-10-11 DIAGNOSIS — Z8709 Personal history of other diseases of the respiratory system: Secondary | ICD-10-CM | POA: Insufficient documentation

## 2015-10-11 DIAGNOSIS — R000 Tachycardia, unspecified: Secondary | ICD-10-CM | POA: Insufficient documentation

## 2015-10-11 DIAGNOSIS — B349 Viral infection, unspecified: Secondary | ICD-10-CM | POA: Insufficient documentation

## 2015-10-11 DIAGNOSIS — Z862 Personal history of diseases of the blood and blood-forming organs and certain disorders involving the immune mechanism: Secondary | ICD-10-CM | POA: Insufficient documentation

## 2015-10-11 DIAGNOSIS — F17210 Nicotine dependence, cigarettes, uncomplicated: Secondary | ICD-10-CM | POA: Insufficient documentation

## 2015-10-11 DIAGNOSIS — Z91040 Latex allergy status: Secondary | ICD-10-CM | POA: Insufficient documentation

## 2015-10-11 DIAGNOSIS — R519 Headache, unspecified: Secondary | ICD-10-CM

## 2015-10-11 DIAGNOSIS — Z859 Personal history of malignant neoplasm, unspecified: Secondary | ICD-10-CM | POA: Insufficient documentation

## 2015-10-11 DIAGNOSIS — I10 Essential (primary) hypertension: Secondary | ICD-10-CM | POA: Insufficient documentation

## 2015-10-11 DIAGNOSIS — Z79899 Other long term (current) drug therapy: Secondary | ICD-10-CM | POA: Insufficient documentation

## 2015-10-11 DIAGNOSIS — Z880 Allergy status to penicillin: Secondary | ICD-10-CM | POA: Insufficient documentation

## 2015-10-11 DIAGNOSIS — B20 Human immunodeficiency virus [HIV] disease: Secondary | ICD-10-CM | POA: Insufficient documentation

## 2015-10-11 DIAGNOSIS — Z87448 Personal history of other diseases of urinary system: Secondary | ICD-10-CM | POA: Insufficient documentation

## 2015-10-11 DIAGNOSIS — R51 Headache: Secondary | ICD-10-CM

## 2015-10-11 DIAGNOSIS — Z8719 Personal history of other diseases of the digestive system: Secondary | ICD-10-CM | POA: Insufficient documentation

## 2015-10-11 LAB — URINALYSIS, ROUTINE W REFLEX MICROSCOPIC
BILIRUBIN URINE: NEGATIVE
Glucose, UA: NEGATIVE mg/dL
Hgb urine dipstick: NEGATIVE
KETONES UR: NEGATIVE mg/dL
LEUKOCYTES UA: NEGATIVE
NITRITE: NEGATIVE
Protein, ur: NEGATIVE mg/dL
SPECIFIC GRAVITY, URINE: 1.028 (ref 1.005–1.030)
pH: 6 (ref 5.0–8.0)

## 2015-10-11 LAB — COMPREHENSIVE METABOLIC PANEL
ALBUMIN: 4 g/dL (ref 3.5–5.0)
ALK PHOS: 75 U/L (ref 38–126)
ALT: 31 U/L (ref 17–63)
ANION GAP: 7 (ref 5–15)
AST: 27 U/L (ref 15–41)
BILIRUBIN TOTAL: 0.3 mg/dL (ref 0.3–1.2)
BUN: 9 mg/dL (ref 6–20)
CALCIUM: 8.8 mg/dL — AB (ref 8.9–10.3)
CO2: 23 mmol/L (ref 22–32)
Chloride: 109 mmol/L (ref 101–111)
Creatinine, Ser: 1.37 mg/dL — ABNORMAL HIGH (ref 0.61–1.24)
GFR calc Af Amer: >60 mL/min (ref >60)
GLUCOSE: 99 mg/dL (ref 65–99)
POTASSIUM: 3.7 mmol/L (ref 3.5–5.1)
Sodium: 139 mmol/L (ref 135–145)
TOTAL PROTEIN: 7.2 g/dL (ref 6.5–8.1)

## 2015-10-11 LAB — CBC
HCT: 39.4 % (ref 39.0–52.0)
HEMOGLOBIN: 13.4 g/dL (ref 13.0–17.0)
MCH: 32.9 pg (ref 26.0–34.0)
MCHC: 34 g/dL (ref 30.0–36.0)
MCV: 96.8 fL (ref 78.0–100.0)
Platelets: 180 10*3/uL (ref 150–400)
RBC: 4.07 MIL/uL — ABNORMAL LOW (ref 4.22–5.81)
RDW: 13.6 % (ref 11.5–15.5)
WBC: 7.1 10*3/uL (ref 4.0–10.5)

## 2015-10-11 LAB — LIPASE, BLOOD: Lipase: 77 U/L — ABNORMAL HIGH (ref 11–51)

## 2015-10-11 LAB — RAPID STREP SCREEN (MED CTR MEBANE ONLY): STREPTOCOCCUS, GROUP A SCREEN (DIRECT): NEGATIVE

## 2015-10-11 NOTE — ED Notes (Addendum)
Patient presents for generalized body aches, diarrhea x2 episodes, non productive cough and chills x2 days. Tolerating PO fluids, denies urinary symptoms, fever and N/V.

## 2015-10-12 MED ORDER — PROCHLORPERAZINE EDISYLATE 5 MG/ML IJ SOLN
10.0000 mg | Freq: Once | INTRAMUSCULAR | Status: AC
  Administered 2015-10-12: 10 mg via INTRAVENOUS
  Filled 2015-10-12: qty 2

## 2015-10-12 MED ORDER — BENZONATATE 100 MG PO CAPS: 100.0000 mg | ORAL_CAPSULE | Freq: Three times a day (TID) | ORAL | Status: DC

## 2015-10-12 MED ORDER — ACETAMINOPHEN 500 MG PO TABS
1000.0000 mg | ORAL_TABLET | Freq: Once | ORAL | Status: AC
  Administered 2015-10-12: 1000 mg via ORAL
  Filled 2015-10-12: qty 2

## 2015-10-12 MED ORDER — DIPHENHYDRAMINE HCL 50 MG/ML IJ SOLN
25.0000 mg | Freq: Once | INTRAMUSCULAR | Status: AC
  Administered 2015-10-12: 25 mg via INTRAVENOUS
  Filled 2015-10-12: qty 1

## 2015-10-12 MED ORDER — SODIUM CHLORIDE 0.9 % IV BOLUS (SEPSIS)
1000.0000 mL | Freq: Once | INTRAVENOUS | Status: AC
  Administered 2015-10-12: 1000 mL via INTRAVENOUS

## 2015-10-12 MED ORDER — BENZONATATE 100 MG PO CAPS
200.0000 mg | ORAL_CAPSULE | Freq: Once | ORAL | Status: AC
  Administered 2015-10-12: 200 mg via ORAL
  Filled 2015-10-12: qty 2

## 2015-10-12 NOTE — ED Notes (Signed)
Pt reports 2 episodes of diarrhea today and dry, non-productive cough since Sunday night 10/10/15.  Epigastric tenderness on palpation, otherwise no pain at this time.

## 2015-10-12 NOTE — ED Provider Notes (Signed)
CSN: [id number]     Arrival date & time 10/11/15  2054 History  By signing my name below, I, Nicole Kindred, attest that this documentation has been prepared under the direction and in the presence of No att. providers found.   Electronically Signed: Nicole Kindred, ED Scribe. 10/12/2015. 7:01 AM   Chief Complaint  Patient presents with  . Generalized Body Aches  . Diarrhea  . Cough    The history is provided by the patient. No language interpreter was used.   HPI Comments: Drew Cuevas is a 45 y.o. male with PMHx of HIV, HTN, GERD, and cancer who presents to the Emergency Department complaining of gradual onset, constant cough, onset three days ago. Pt reports associated diarrhea, congestion, mild abdominal pain, chest pain presenting with cough, myalgia, and headache ongoing for two days. No worsening or alleviating factors noted. Pt denies vomiting, dysuria, fever, rashes, shortness of breath, decreased fluid consumption, or any other pertinent symptoms. Pt sees infectious disease specialist, Dr. Megan Salon for his HIV control. Pt states he is compliant with HAART therapy, viral load undetectable and last CD4 count over 700.   Past Medical History  Diagnosis Date  . Hypertension   . HIV (human immunodeficiency virus infection) ([place])     UNDER CONTROL WITH MEDICATIONS  . Immune deficiency disorder ([place])   . Bronchitis     LAST FLARE UP WAS NEW YEARS 2015  . Renal mass, right   . GERD (gastroesophageal reflux disease)     NO MEDS  . Cancer ([place])   . Renal insufficiency 05/17/2015   Past Surgical History  Procedure Laterality Date  . No past surgeries    . Robot assisted laparoscopic nephrectomy Right 08/19/2013    Procedure: ROBOTIC ASSISTED LAPAROSCOPIC RIGHT PARTIAL NEPHRECTOMY;  Surgeon: Ardis Hughs, MD;  Location: [facility];  Service: Urology;  Laterality: Right;   Family History  Problem Relation Age of Onset  . Hypertension Mother   . Hypertension Sister   .  Hypertension Brother    Social History  Substance Use Topics  . Smoking status: Heavy Tobacco Smoker -- 0.50 packs/day    Types: Cigarettes  . Smokeless tobacco: Never Used  . Alcohol Use: No    Review of Systems  10 Systems reviewed and all are negative for acute change except as noted in the HPI.   Allergies  Ibuprofen; Penicillins; and Latex  Home Medications   Prior to Admission medications   Medication Sig Start Date End Date Taking? Authorizing Provider  Abacavir-Dolutegravir-Lamivud [id number] MG TABS Take 1 tablet by mouth daily. 05/25/15  Yes Michel Bickers, MD  acetaminophen (TYLENOL) 500 MG tablet Take 500 mg by mouth every 6 (six) hours as needed for moderate pain or fever.   Yes Historical Provider, MD  amLODipine (NORVASC) 5 MG tablet Take 1 tablet (5 mg total) by mouth at bedtime. 06/02/15  Yes Tresa Garter, MD  benzonatate (TESSALON) 100 MG capsule Take 1 capsule (100 mg total) by mouth every 8 (eight) hours. 10/12/15   Wenda Overland Little, MD   BP 121/92 mmHg  Pulse 97  Temp(Src) 100.8 F (38.2 C) (Oral)  Resp 18  Ht 5\' 6"  (1.676 m)  Wt 196 lb (88.905 kg)  BMI 31.65 kg/m2  SpO2 100% Physical Exam  Constitutional: He is oriented to person, place, and time. He appears well-developed and well-nourished. No distress.  Ill appearing but non-toxic. Wash cloth over eyes.   HENT:  Head: Normocephalic and atraumatic.  Mouth/Throat: Oropharynx  is clear and moist. No oropharyngeal exudate.  Moist mucous membranes  Eyes: Conjunctivae are normal. Pupils are equal, round, and reactive to light.  Neck: Neck supple.  Cardiovascular: Regular rhythm and normal heart sounds.   No murmur heard. Mildly tachycardic.   Pulmonary/Chest: Effort normal and breath sounds normal.  Abdominal: Soft. Bowel sounds are normal. He exhibits no distension. There is no tenderness.  Musculoskeletal: He exhibits no edema.  Neurological: He is alert and oriented to person, place, and  time. He exhibits normal muscle tone. Coordination normal.  Fluent speech  Skin: Skin is warm and dry. No rash noted.  Psychiatric: He has a normal mood and affect. Judgment normal.  Nursing note and vitals reviewed.   ED Course  Procedures (including critical care time) DIAGNOSTIC STUDIES: Oxygen Saturation is 100% on RA, normal by my interpretation.    COORDINATION OF CARE: 1:39 AM-Discussed treatment plan which includes CXR, CMP, CBC, urinalysis, lipase, and rapid strep screen with pt at bedside and pt agreed to plan.    Labs Review Labs Reviewed  LIPASE, BLOOD - Abnormal; Notable for the following:    Lipase 77 (*)    All other components within normal limits  COMPREHENSIVE METABOLIC PANEL - Abnormal; Notable for the following:    Creatinine, Ser 1.37 (*)    Calcium 8.8 (*)    All other components within normal limits  CBC - Abnormal; Notable for the following:    RBC 4.07 (*)    All other components within normal limits  RAPID STREP SCREEN (NOT AT [facility])  CULTURE, GROUP A STREP ([place])  URINALYSIS, ROUTINE W REFLEX MICROSCOPIC (NOT AT [facility])    Imaging Review Dg Chest 2 View  10/11/2015  CLINICAL DATA:  Acute onset of cough, congestion and body aches. Low-grade fever. Initial encounter. EXAM: CHEST  2 VIEW COMPARISON:  Chest radiograph performed 08/04/2015 FINDINGS: The lungs are well-aerated and clear. There is no evidence of focal opacification, pleural effusion or pneumothorax. The heart is normal in size; the mediastinal contour is within normal limits. No acute osseous abnormalities are seen. IMPRESSION: No acute cardiopulmonary process seen. Electronically Signed   By: Garald Balding M.D.   On: 10/11/2015 21:45   I have personally reviewed and evaluated these lab results as part of my medical decision-making.   EKG Interpretation None     Medications  acetaminophen (TYLENOL) tablet 1,000 mg (1,000 mg Oral Given 10/12/15 0112)  sodium chloride 0.9 % bolus 1,000 mL  (0 mLs Intravenous Stopped 10/12/15 0512)  diphenhydrAMINE (BENADRYL) injection 25 mg (25 mg Intravenous Given 10/12/15 0224)  prochlorperazine (COMPAZINE) injection 10 mg (10 mg Intravenous Given 10/12/15 0225)  benzonatate (TESSALON) capsule 200 mg (200 mg Oral Given 10/12/15 0225)    MDM   Final diagnoses:  Viral syndrome  Acute nonintractable headache, unspecified headache type   Patient with several days of body aches, diarrhea, cough, and body aches. On exam he was ill-appearing but nontoxic and in no acute distress. Vital signs notable for fever of 100.8. Normal neurologic exam and no abnormal lung sounds. Obtained above labs which showed stable creatinine at 1.37. Gave the patient Tylenol and Tessalon as well as an IV fluid bolus. Also gave Benadryl and Compazine for his headache. Patient states that he has had headaches like this in the past therefore I feel intracranial process is unlikely. Chest x-ray negative acute.  On reexamination, the patient stated that he felt better and his headache was resolved. His fever had also improved.  Given his symptoms, I suspect a viral process. He is greater than 48 hours into his illness and I do not feel Tamiflu is warranted at this time. Discussed supportive care including Tylenol, continued hydration, and monitoring for any worsening symptoms. Patient voiced understanding of return precautions and was discharged in satisfactory condition.  I personally performed the services described in this documentation, which was scribed in my presence. The recorded information has been reviewed and is accurate.    Sharlett Iles, MD 10/12/15 [phone]

## 2015-10-14 LAB — CULTURE, GROUP A STREP (THRC)

## 2015-11-02 ENCOUNTER — Ambulatory Visit: Payer: Self-pay

## 2015-12-05 ENCOUNTER — Encounter: Payer: Self-pay | Admitting: Internal Medicine

## 2016-01-01 ENCOUNTER — Emergency Department (HOSPITAL_COMMUNITY)
Admission: EM | Admit: 2016-01-01 | Discharge: 2016-01-01 | Disposition: A | Discharge status: Home / Self Care | Payer: Self-pay | Attending: Emergency Medicine | Admitting: Emergency Medicine

## 2016-01-01 ENCOUNTER — Encounter (HOSPITAL_COMMUNITY): Payer: Self-pay

## 2016-01-01 DIAGNOSIS — J029 Acute pharyngitis, unspecified: Secondary | ICD-10-CM | POA: Insufficient documentation

## 2016-01-01 DIAGNOSIS — R000 Tachycardia, unspecified: Secondary | ICD-10-CM | POA: Insufficient documentation

## 2016-01-01 DIAGNOSIS — Z91040 Latex allergy status: Secondary | ICD-10-CM | POA: Insufficient documentation

## 2016-01-01 DIAGNOSIS — H9209 Otalgia, unspecified ear: Secondary | ICD-10-CM | POA: Insufficient documentation

## 2016-01-01 DIAGNOSIS — Z87448 Personal history of other diseases of urinary system: Secondary | ICD-10-CM | POA: Insufficient documentation

## 2016-01-01 DIAGNOSIS — B20 Human immunodeficiency virus [HIV] disease: Secondary | ICD-10-CM | POA: Insufficient documentation

## 2016-01-01 DIAGNOSIS — F17210 Nicotine dependence, cigarettes, uncomplicated: Secondary | ICD-10-CM | POA: Insufficient documentation

## 2016-01-01 DIAGNOSIS — Z8719 Personal history of other diseases of the digestive system: Secondary | ICD-10-CM | POA: Insufficient documentation

## 2016-01-01 DIAGNOSIS — Z79899 Other long term (current) drug therapy: Secondary | ICD-10-CM | POA: Insufficient documentation

## 2016-01-01 DIAGNOSIS — I10 Essential (primary) hypertension: Secondary | ICD-10-CM | POA: Insufficient documentation

## 2016-01-01 DIAGNOSIS — Z8709 Personal history of other diseases of the respiratory system: Secondary | ICD-10-CM | POA: Insufficient documentation

## 2016-01-01 DIAGNOSIS — Z880 Allergy status to penicillin: Secondary | ICD-10-CM | POA: Insufficient documentation

## 2016-01-01 DIAGNOSIS — Z85528 Personal history of other malignant neoplasm of kidney: Secondary | ICD-10-CM | POA: Insufficient documentation

## 2016-01-01 LAB — RAPID STREP SCREEN (MED CTR MEBANE ONLY): Streptococcus, Group A Screen (Direct): NEGATIVE

## 2016-01-01 MED ORDER — CEPHALEXIN 500 MG PO CAPS: 500.0000 mg | ORAL_CAPSULE | Freq: Two times a day (BID) | ORAL | Status: DC

## 2016-01-01 MED ORDER — DEXAMETHASONE 1 MG/ML PO CONC
8.0000 mg | Freq: Once | ORAL | Status: DC
  Filled 2016-01-01: qty 8

## 2016-01-01 MED ORDER — DEXAMETHASONE 10 MG/ML FOR PEDIATRIC ORAL USE
8.0000 mg | Freq: Once | INTRAMUSCULAR | Status: AC
  Administered 2016-01-01: 8 mg via ORAL
  Filled 2016-01-01: qty 0.8

## 2016-01-01 MED ORDER — ACETAMINOPHEN 325 MG PO TABS
650.0000 mg | ORAL_TABLET | Freq: Once | ORAL | Status: AC
  Administered 2016-01-01: 650 mg via ORAL
  Filled 2016-01-01: qty 2

## 2016-01-01 NOTE — ED Notes (Signed)
Pt aware waiting for Decadron from pharmacy.

## 2016-01-01 NOTE — ED Notes (Signed)
K Gekas, PA, in w/pt. 

## 2016-01-01 NOTE — ED Notes (Signed)
Per pharmacy tech, pharmacist is reviewing order for Decadron.

## 2016-01-01 NOTE — ED Notes (Signed)
Patient here with sore throat, headache, body aches and chills since yesterday. No distress

## 2016-01-01 NOTE — ED Notes (Signed)
Raquel Branton, PA, aware increase in pt's temp after Tylenol. Encouraged pt to drink fluids and take Tylenol as directed.

## 2016-01-01 NOTE — ED Provider Notes (Signed)
CSN: [id number]     Arrival date & time 01/01/16  0735 History   First MD Initiated Contact with Patient 01/01/16 [phone]     Chief Complaint  Patient presents with  . Sore Throat  . Generalized Body Aches   Patient is a 45 y.o. male presenting with pharyngitis.  Sore Throat Associated symptoms include chills, fatigue, a fever, myalgias and a sore throat. Pertinent negatives include no abdominal pain, chest pain, congestion, coughing, nausea or vomiting.    45 year old male presents with acute onset of a sore throat and generalized body aches. Past medical history significant for hypertension and HIV. He states his current viral loads are undetected and he is compliant with his ART therapy. He reports he woke up yesterday with a sore throat. This is followed by fever, chills generalized malaise, headache. Reports associated right ear pain. Denies cough, shortness of breath, abdominal pain, nausea, vomiting. He has been taking Tylenol at home. 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  Past Medical History  Diagnosis Date  . Hypertension   . HIV (human immunodeficiency virus infection) ([place])     UNDER CONTROL WITH MEDICATIONS  . Immune deficiency disorder ([place])   . Bronchitis     LAST FLARE UP WAS NEW YEARS 2015  . Renal mass, right   . GERD (gastroesophageal reflux disease)     NO MEDS  . Renal insufficiency 05/17/2015  . Cancer [facility])     kidney   Past Surgical History  Procedure Laterality Date  . No past surgeries    . Robot assisted laparoscopic nephrectomy Right 08/19/2013    Procedure: ROBOTIC ASSISTED LAPAROSCOPIC RIGHT PARTIAL NEPHRECTOMY;  Surgeon: Ardis Hughs, MD;  Location: [facility];  Service: Urology;  Laterality: Right;   Family History  Problem Relation Age of Onset  . Hypertension Mother   . Hypertension Sister   . Hypertension Brother    Social History  Substance Use Topics  . Smoking status: Heavy Tobacco Smoker -- 0.50  packs/day    Types: Cigarettes  . Smokeless tobacco: Never Used  . Alcohol Use: No    Review of Systems  Constitutional: Positive for fever, chills and fatigue. Negative for appetite change.  HENT: Positive for ear pain and sore throat. Negative for congestion, ear discharge, rhinorrhea, sinus pressure and trouble swallowing.   Respiratory: Negative for cough and shortness of breath.   Cardiovascular: Negative for chest pain.  Gastrointestinal: Negative for nausea, vomiting and abdominal pain.  Musculoskeletal: Positive for myalgias.    Allergies  Ibuprofen; Penicillins; and Latex  Home Medications   Prior to Admission medications   Medication Sig Start Date End Date Taking? Authorizing Provider  Abacavir-Dolutegravir-Lamivud [id number] MG TABS Take 1 tablet by mouth daily. 05/25/15   Michel Bickers, MD  acetaminophen (TYLENOL) 500 MG tablet Take 500 mg by mouth every 6 (six) hours as needed for moderate pain or fever.    Historical Provider, MD  amLODipine (NORVASC) 5 MG tablet Take 1 tablet (5 mg total) by mouth at bedtime. 06/02/15   Tresa Garter, MD  benzonatate (TESSALON) 100 MG capsule Take 1 capsule (100 mg total) by mouth every 8 (eight) hours. 10/12/15   Wenda Overland Little, MD   BP 140/96 mmHg  Pulse 115  Temp(Src) 101.1 F (38.4 C) (Oral)  Resp 18  SpO2 100%   Physical Exam  Constitutional: He is oriented to person, place, and time. He appears well-developed and well-nourished. No distress.  Appears  ill  HENT:  Head: Normocephalic and atraumatic.  Right Ear: Hearing, tympanic membrane, external ear and ear canal normal.  Left Ear: Hearing, tympanic membrane and external ear normal.  Nose: No mucosal edema or rhinorrhea.  Mouth/Throat: Uvula is midline and mucous membranes are normal. Oropharyngeal exudate and posterior oropharyngeal erythema present. No posterior oropharyngeal edema or tonsillar abscesses.  Mildly erythematous L ear canal  Eyes:  Conjunctivae are normal. Pupils are equal, round, and reactive to light. Right eye exhibits no discharge. Left eye exhibits no discharge. No scleral icterus.  Neck: Normal range of motion.  Cardiovascular: Regular rhythm.  Tachycardia present.   Pulmonary/Chest: Effort normal. No respiratory distress. He has no wheezes. He has no rales. He exhibits no tenderness.  Abdominal: Soft. There is no tenderness.  Neurological: He is alert and oriented to person, place, and time.  Skin: Skin is warm and dry. No pallor.  Psychiatric: He has a normal mood and affect.    ED Course  Procedures (including critical care time) Labs Review Labs Reviewed  RAPID STREP SCREEN (NOT AT [facility])  CULTURE, GROUP A STREP [facility])    Imaging Review No results found. I have personally reviewed and evaluated these images and lab results as part of my medical decision-making.   EKG Interpretation None      MDM   Final diagnoses:  Pharyngitis   45 year old male presents with symptoms consistent with strep pharyngitis. His rapid strep was negative however clinically he appears ill and has tonsillar exudates. He is allergic to penicillin (rash) - 10 days of Keflex given. One dose of Decadron given here in the ED. Patient advised he is still infectious for 24 hours once he starts taking antibiotics. Work note provided. Patient is NAD, non-toxic, with stable VS. Patient is informed of clinical course, understands medical decision making process, and agrees with plan. Opportunity for questions provided and all questions answered. Return precautions given.  Recardo Evangelist, PA-C 01/01/16 [id number]  Charlesetta Shanks, MD 01/02/16 1440

## 2016-01-01 NOTE — Discharge Instructions (Signed)

## 2016-01-03 LAB — CULTURE, GROUP A STREP (THRC)

## 2016-01-04 ENCOUNTER — Encounter (HOSPITAL_COMMUNITY): Payer: Self-pay | Admitting: Emergency Medicine

## 2016-01-04 ENCOUNTER — Emergency Department (HOSPITAL_COMMUNITY)
Admission: EM | Admit: 2016-01-04 | Discharge: 2016-01-05 | Disposition: A | Discharge status: Home / Self Care | Payer: Self-pay | Attending: Emergency Medicine | Admitting: Emergency Medicine

## 2016-01-04 ENCOUNTER — Emergency Department (HOSPITAL_COMMUNITY): Payer: Self-pay

## 2016-01-04 DIAGNOSIS — Z79899 Other long term (current) drug therapy: Secondary | ICD-10-CM | POA: Insufficient documentation

## 2016-01-04 DIAGNOSIS — R110 Nausea: Secondary | ICD-10-CM | POA: Insufficient documentation

## 2016-01-04 DIAGNOSIS — Z21 Asymptomatic human immunodeficiency virus [HIV] infection status: Secondary | ICD-10-CM | POA: Insufficient documentation

## 2016-01-04 DIAGNOSIS — Z85528 Personal history of other malignant neoplasm of kidney: Secondary | ICD-10-CM | POA: Insufficient documentation

## 2016-01-04 DIAGNOSIS — J029 Acute pharyngitis, unspecified: Secondary | ICD-10-CM | POA: Insufficient documentation

## 2016-01-04 DIAGNOSIS — I10 Essential (primary) hypertension: Secondary | ICD-10-CM | POA: Insufficient documentation

## 2016-01-04 DIAGNOSIS — F17210 Nicotine dependence, cigarettes, uncomplicated: Secondary | ICD-10-CM | POA: Insufficient documentation

## 2016-01-04 DIAGNOSIS — R05 Cough: Secondary | ICD-10-CM

## 2016-01-04 DIAGNOSIS — R197 Diarrhea, unspecified: Secondary | ICD-10-CM | POA: Insufficient documentation

## 2016-01-04 DIAGNOSIS — R059 Cough, unspecified: Secondary | ICD-10-CM

## 2016-01-04 NOTE — Discharge Instructions (Signed)
Continue taking antibiotics as directed. Tylenol as needed for pain. Follow up with your primary care doctor if symptoms do not improve in 3-5 days. Return to ER for new or worsening symptoms, any additional concerns.

## 2016-01-04 NOTE — ED Provider Notes (Signed)
CSN: [id number]     Arrival date & time 01/04/16  2204 History  By signing my name below, I, [name], attest that this documentation has been prepared under the direction and in the presence of [facility], PA-C.  Electronically Signed: [name], ED Scribe. 01/04/2016. 10:39 PM.   Chief Complaint  Patient presents with  . Sore Throat  . Nausea  . Diarrhea   The history is provided by the patient. No language interpreter was used.   HPI Comments: Drew Cuevas is a 45 y.o. male with PMHx HIV and HTN, who presents to the Emergency Department complaining of gradual onset, constant, sore throat x 5 days, worsening today. Pt also complains of a headache, nasal congestion, nausea, 4 episodes of diarrhea, fever, and a productive cough. He was seen in the ED on 01/01/2016 (approximately 3 days ago) for the same symptoms and was diagnosed with exudative pharyngitis. Pt was prescribed antibiotics which he has been complaint with. He was also given a dose of Decadron while in the ED.Strep culture came back negative. He mentions that he felt mildly better after taking the Decadron and the antibiotics, but woke up this morning feeling worse. He has not taken anything else for his symptoms. Denies vomiting, abdominal pain, or any other associated symptoms.   Past Medical History  Diagnosis Date  . Hypertension   . HIV (human immunodeficiency virus infection) ([place])     UNDER CONTROL WITH MEDICATIONS  . Immune deficiency disorder ([place])   . Bronchitis     LAST FLARE UP WAS NEW YEARS 2015  . Renal mass, right   . GERD (gastroesophageal reflux disease)     NO MEDS  . Renal insufficiency 05/17/2015  . Cancer [facility])     kidney   Past Surgical History  Procedure Laterality Date  . No past surgeries    . Robot assisted laparoscopic nephrectomy Right 08/19/2013    Procedure: ROBOTIC ASSISTED LAPAROSCOPIC RIGHT PARTIAL NEPHRECTOMY;  Surgeon: Ardis Hughs, MD;  Location: [facility];  Service:  Urology;  Laterality: Right;   Family History  Problem Relation Age of Onset  . Hypertension Mother   . Hypertension Sister   . Hypertension Brother    Social History  Substance Use Topics  . Smoking status: Heavy Tobacco Smoker -- 0.50 packs/day    Types: Cigarettes  . Smokeless tobacco: Never Used  . Alcohol Use: No    Review of Systems  Constitutional: Positive for fever.  HENT: Positive for congestion, ear pain and sore throat.   Respiratory: Positive for cough.   Gastrointestinal: Positive for nausea and diarrhea. Negative for vomiting and abdominal pain.  Neurological: Positive for headaches.  A complete 10 system review of systems was obtained and all systems are negative except as noted in the HPI and PMH.   Allergies  Ibuprofen; Penicillins; and Latex  Home Medications   Prior to Admission medications   Medication Sig Start Date End Date Taking? Authorizing Provider  Abacavir-Dolutegravir-Lamivud [id number] MG TABS Take 1 tablet by mouth daily. 05/25/15  Yes Michel Bickers, MD  amLODipine (NORVASC) 5 MG tablet Take 1 tablet (5 mg total) by mouth at bedtime. 06/02/15  Yes Tresa Garter, MD  benzonatate (TESSALON) 100 MG capsule Take 1 capsule (100 mg total) by mouth every 8 (eight) hours. 10/12/15  Yes Wenda Overland Little, MD  cephALEXin (KEFLEX) 500 MG capsule Take 1 capsule (500 mg total) by mouth 2 (two) times daily. Patient taking differently: Take 500 mg by  mouth 2 (two) times daily. ABT Start Date 01/01/16 & End Date 01/11/16. 01/01/16  Yes Recardo Evangelist, PA-C  acetaminophen (TYLENOL) 500 MG tablet Take 500 mg by mouth every 6 (six) hours as needed for moderate pain or fever.    Historical Provider, MD   BP 148/84 mmHg  Pulse 105  Temp(Src) 99 F (37.2 C) (Oral)  Resp 18  SpO2 100%   Physical Exam  Constitutional: He is oriented to person, place, and time. He appears well-developed and well-nourished. No distress.  HENT:  Head: Normocephalic and  atraumatic.  Mouth/Throat: Posterior oropharyngeal erythema present. No oropharyngeal exudate.  Oropharynx with erythema and tonsillar hypertrophy. No exudates.  Nasal congestion present.   Eyes: Conjunctivae and EOM are normal.  Neck: Neck supple. No tracheal deviation present.  Cardiovascular: Normal heart sounds and intact distal pulses.   Mildly tachycardic on exam, but regular rhythm.   Pulmonary/Chest: Effort normal and breath sounds normal. No respiratory distress. He has no wheezes. He has no rales.  Abdominal: Soft. There is tenderness. There is no rebound and no guarding.  Mild epigastric tenderness.   Musculoskeletal: Normal range of motion.  Neurological: He is alert and oriented to person, place, and time.  Skin: Skin is warm and dry.  Psychiatric: He has a normal mood and affect. His behavior is normal.  Nursing note and vitals reviewed.   ED Course  Procedures (including critical care time)  DIAGNOSTIC STUDIES: Oxygen Saturation is 100% on RA, normal by my interpretation.    COORDINATION OF CARE: 10:35 PM-Discussed treatment plan which includes CXR with pt at bedside and pt agreed to plan.   Labs Review Labs Reviewed - No data to display  Imaging Review Dg Chest 2 View  01/04/2016  CLINICAL DATA:  Cough and sore throat for several days, initial encounter EXAM: CHEST  2 VIEW COMPARISON:  10/11/2015 FINDINGS: The heart size and mediastinal contours are within normal limits. Both lungs are clear. The visualized skeletal structures are unremarkable. IMPRESSION: No active cardiopulmonary disease. Electronically Signed   By: Inez Catalina M.D.   On: 01/04/2016 23:06   I have personally reviewed and evaluated these images and lab results as part of my medical decision-making.   EKG Interpretation None      MDM   Final diagnoses:  Cough  Pharyngitis   Drew Cuevas presents to ED for sore throat. Treated with decadron and ABX when seen for same in ED on 6/04. At  this visit, chart review states he had OP exudates which were not present today, so objectively OP seems improved. No sign of PTA or spreading neck space infection. He states fevers have improved over the last few days. CXR was obtained which was unremarkable. Plan: Continue ABX as directed, increase hydration. PCP follow up strongly encouraged. Return precautions given and all questions answered.   I personally performed the services described in this documentation, which was scribed in my presence. The recorded information has been reviewed and is accurate.    [facility] Cacey Willow, PA-C 01/05/16 0001  Tanna Furry, MD 01/19/16 [phone]

## 2016-01-04 NOTE — ED Notes (Signed)
Patient presents for sore throat, bilateral ear pain, nasal congestion, N/D (x4 episodes). Denies vomiting, fever. Recently diagnosed with strep throat, reports he was getting better with Keflex and woke up today feeling worse.

## 2016-01-21 ENCOUNTER — Encounter (HOSPITAL_COMMUNITY): Payer: Self-pay | Admitting: Emergency Medicine

## 2016-01-21 ENCOUNTER — Emergency Department (HOSPITAL_COMMUNITY)
Admission: EM | Admit: 2016-01-21 | Discharge: 2016-01-21 | Disposition: A | Discharge status: Home / Self Care | Payer: Self-pay | Attending: Emergency Medicine | Admitting: Emergency Medicine

## 2016-01-21 DIAGNOSIS — J029 Acute pharyngitis, unspecified: Secondary | ICD-10-CM | POA: Insufficient documentation

## 2016-01-21 DIAGNOSIS — Z79899 Other long term (current) drug therapy: Secondary | ICD-10-CM | POA: Insufficient documentation

## 2016-01-21 DIAGNOSIS — Z85528 Personal history of other malignant neoplasm of kidney: Secondary | ICD-10-CM | POA: Insufficient documentation

## 2016-01-21 DIAGNOSIS — B20 Human immunodeficiency virus [HIV] disease: Secondary | ICD-10-CM | POA: Insufficient documentation

## 2016-01-21 DIAGNOSIS — F17210 Nicotine dependence, cigarettes, uncomplicated: Secondary | ICD-10-CM | POA: Insufficient documentation

## 2016-01-21 DIAGNOSIS — I10 Essential (primary) hypertension: Secondary | ICD-10-CM | POA: Insufficient documentation

## 2016-01-21 LAB — RAPID STREP SCREEN (MED CTR MEBANE ONLY): Streptococcus, Group A Screen (Direct): NEGATIVE

## 2016-01-21 MED ORDER — AZITHROMYCIN 250 MG PO TABS
500.0000 mg | ORAL_TABLET | Freq: Once | ORAL | Status: AC
  Administered 2016-01-21: 500 mg via ORAL
  Filled 2016-01-21: qty 2

## 2016-01-21 MED ORDER — DEXAMETHASONE SODIUM PHOSPHATE 10 MG/ML IJ SOLN
10.0000 mg | Freq: Once | INTRAMUSCULAR | Status: DC
  Filled 2016-01-21: qty 1

## 2016-01-21 MED ORDER — DEXAMETHASONE SODIUM PHOSPHATE 10 MG/ML IJ SOLN
10.0000 mg | Freq: Once | INTRAMUSCULAR | Status: AC
  Administered 2016-01-21: 10 mg via INTRAMUSCULAR

## 2016-01-21 MED ORDER — PREDNISONE 10 MG (21) PO TBPK: 10.0000 mg | ORAL_TABLET | Freq: Every day | ORAL | Status: DC

## 2016-01-21 MED ORDER — AZITHROMYCIN 250 MG PO TABS: ORAL_TABLET | ORAL | Status: DC

## 2016-01-21 NOTE — ED Provider Notes (Signed)
CSN: [id number]     Arrival date & time 01/21/16  [id number] History   First MD Initiated Contact with Patient 01/21/16 1014     Chief Complaint  Patient presents with  . Sore Throat  . Neck Pain   PT IS A 45 YO BM WHO PRESENTS TO THE ED TODAY WITH SORE THROAT.  THE PT SAID THAT HE HAS A HX OF HIV, BUT VIRAL LOADS ARE UNDETECTABLE AND HE HAS BEEN TAKING HIS MEDS AS DIRECTED.  PT WAS IN THE ED ON 6/5 WITH SIMILAR COMPLAINTS.  HIS STREP WAS NEGATIVE THEN, BUT HE WAS TREATED WITH KEFLEX.  PT SAID THAT SX RESOLVED, BUT THEN CAME BACK.  THE PT DENIES ANY FEVER TODAY.  (Consider location/radiation/quality/duration/timing/severity/associated sxs/prior Treatment) Patient is a 45 y.o. male presenting with pharyngitis and neck pain. The history is provided by the patient.  Sore Throat This is a recurrent problem. The current episode started more than 1 week ago. The problem occurs constantly. The problem has not changed since onset.The symptoms are aggravated by swallowing. Nothing relieves the symptoms.  Neck Pain   Past Medical History  Diagnosis Date  . Hypertension   . HIV (human immunodeficiency virus infection) ([place])     UNDER CONTROL WITH MEDICATIONS  . Immune deficiency disorder ([place])   . Bronchitis     LAST FLARE UP WAS NEW YEARS 2015  . Renal mass, right   . GERD (gastroesophageal reflux disease)     NO MEDS  . Renal insufficiency 05/17/2015  . Cancer [facility])     kidney   Past Surgical History  Procedure Laterality Date  . No past surgeries    . Robot assisted laparoscopic nephrectomy Right 08/19/2013    Procedure: ROBOTIC ASSISTED LAPAROSCOPIC RIGHT PARTIAL NEPHRECTOMY;  Surgeon: Ardis Hughs, MD;  Location: [facility];  Service: Urology;  Laterality: Right;   Family History  Problem Relation Age of Onset  . Hypertension Mother   . Hypertension Sister   . Hypertension Brother    Social History  Substance Use Topics  . Smoking status: Heavy Tobacco Smoker -- 0.50 packs/day     Types: Cigarettes  . Smokeless tobacco: Never Used  . Alcohol Use: No    Review of Systems  HENT: Positive for sore throat and trouble swallowing.   Musculoskeletal: Positive for neck pain.  All other systems reviewed and are negative.     Allergies  Ibuprofen; Penicillins; and Latex  Home Medications   Prior to Admission medications   Medication Sig Start Date End Date Taking? Authorizing Provider  Abacavir-Dolutegravir-Lamivud [id number] MG TABS Take 1 tablet by mouth daily. 05/25/15  Yes Michel Bickers, MD  amLODipine (NORVASC) 5 MG tablet Take 1 tablet (5 mg total) by mouth at bedtime. 06/02/15  Yes Tresa Garter, MD  azithromycin (ZITHROMAX) 250 MG tablet TAKE 1 PILL DAILY FOR 4 DAYS STARTING ON 6/25 01/21/16   Isla Pence, MD  benzonatate (TESSALON) 100 MG capsule Take 1 capsule (100 mg total) by mouth every 8 (eight) hours. Patient not taking: Reported on 01/21/2016 10/12/15   Sharlett Iles, MD  cephALEXin (KEFLEX) 500 MG capsule Take 1 capsule (500 mg total) by mouth 2 (two) times daily. Patient not taking: Reported on 01/21/2016 01/01/16   Recardo Evangelist, PA-C  predniSONE (STERAPRED UNI-PAK 21 TAB) 10 MG (21) TBPK tablet Take 1 tablet (10 mg total) by mouth daily. Take 6 tabs by mouth daily  for 2 days, then 5 tabs for 2 days, then  4 tabs for 2 days, then 3 tabs for 2 days, 2 tabs for 2 days, then 1 tab by mouth daily for 2 days 01/21/16   Isla Pence, MD   BP 128/91 mmHg  Pulse 98  Temp(Src) 98 F (36.7 C) (Oral)  Resp 18  SpO2 99% Physical Exam  Constitutional: He is oriented to person, place, and time. He appears well-developed and well-nourished.  HENT:  Head: Normocephalic and atraumatic.  Right Ear: External ear normal.  Left Ear: External ear normal.  Nose: Nose normal.  Mouth/Throat: Posterior oropharyngeal erythema present.  Eyes: Conjunctivae and EOM are normal. Pupils are equal, round, and reactive to light.  Neck: Neck supple.   Cardiovascular: Normal rate, regular rhythm, normal heart sounds and intact distal pulses.   Pulmonary/Chest: Effort normal and breath sounds normal.  Abdominal: Soft. Bowel sounds are normal.  Musculoskeletal: Normal range of motion.  Lymphadenopathy:    He has cervical adenopathy.  Neurological: He is alert and oriented to person, place, and time.  Skin: Skin is warm and dry.  Psychiatric: He has a normal mood and affect. His behavior is normal. Judgment and thought content normal.  Nursing note and vitals reviewed.   ED Course  Procedures (including critical care time) Labs Review Labs Reviewed  RAPID STREP SCREEN (NOT AT [facility])  CULTURE, GROUP A STREP [facility])    Imaging Review No results found. I have personally reviewed and evaluated these images and lab results as part of my medical decision-making.   EKG Interpretation None      MDM  PT'S STREP IS NEGATIVE, BUT THROAT IS VERY RED WITH CERVICAL CHAIN LAD.  I WILL START PT ON ZITHROMAX AS KEFLEX DID NOT HELP.  PT ALSO GIVEN A RX FOR DECADRON.  HE IS GIVEN THE NUMBER FOR ENT TO F/U WITH IF SX ARE NOT IMPROVING. Final diagnoses:  Pharyngitis     Isla Pence, MD 01/21/16 1052

## 2016-01-21 NOTE — ED Notes (Signed)
Pt c/o sore throat, white patches and neck pain. Pt was diagnosed about 3 weeks ago with pharyngitis, took his course of antibiotics and is still having a lot of pain swallowing.

## 2016-01-21 NOTE — Discharge Instructions (Signed)

## 2016-01-24 LAB — CULTURE, GROUP A STREP (THRC)

## 2016-02-21 ENCOUNTER — Other Ambulatory Visit: Payer: Self-pay

## 2016-02-23 ENCOUNTER — Other Ambulatory Visit: Payer: Self-pay

## 2016-02-23 DIAGNOSIS — B20 Human immunodeficiency virus [HIV] disease: Secondary | ICD-10-CM

## 2016-02-23 LAB — LIPID PANEL
CHOL/HDL RATIO: 6.3 ratio — AB (ref <=5.0)
CHOLESTEROL: 222 mg/dL — AB (ref 125–200)
HDL: 35 mg/dL — AB (ref >=40)
LDL Cholesterol: 148 mg/dL — ABNORMAL HIGH (ref <130)
Triglycerides: 197 mg/dL — ABNORMAL HIGH (ref <150)
VLDL: 39 mg/dL — AB (ref <30)

## 2016-02-23 LAB — COMPREHENSIVE METABOLIC PANEL
ALK PHOS: 82 U/L (ref 40–115)
ALT: 23 U/L (ref 9–46)
AST: 18 U/L (ref 10–40)
Albumin: 4 g/dL (ref 3.6–5.1)
BUN: 12 mg/dL (ref 7–25)
CALCIUM: 9.2 mg/dL (ref 8.6–10.3)
CHLORIDE: 108 mmol/L (ref 98–110)
CO2: 23 mmol/L (ref 20–31)
Creat: 1.52 mg/dL — ABNORMAL HIGH (ref 0.60–1.35)
Glucose, Bld: 74 mg/dL (ref 65–99)
POTASSIUM: 4.2 mmol/L (ref 3.5–5.3)
Sodium: 141 mmol/L (ref 135–146)
TOTAL PROTEIN: 6.8 g/dL (ref 6.1–8.1)
Total Bilirubin: 0.3 mg/dL (ref 0.2–1.2)

## 2016-02-23 LAB — CBC
HEMATOCRIT: 42.8 % (ref 38.5–50.0)
Hemoglobin: 14.8 g/dL (ref 13.2–17.1)
MCH: 32.8 pg (ref 27.0–33.0)
MCHC: 34.6 g/dL (ref 32.0–36.0)
MCV: 94.9 fL (ref 80.0–100.0)
MPV: 11.1 fL (ref 7.5–12.5)
Platelets: 254 10*3/uL (ref 140–400)
RBC: 4.51 MIL/uL (ref 4.20–5.80)
RDW: 14.7 % (ref 11.0–15.0)
WBC: 6.4 10*3/uL (ref 3.8–10.8)

## 2016-02-24 LAB — T-HELPER CELL (CD4) - (RCID CLINIC ONLY)
CD4 T CELL ABS: 730 /uL (ref 400–2700)
CD4 T CELL HELPER: 26 % — AB (ref 33–55)

## 2016-02-24 LAB — RPR

## 2016-02-27 LAB — HIV-1 RNA QUANT-NO REFLEX-BLD: HIV-1 RNA Quant, Log: <1.3 Log copies/mL (ref <1.30)

## 2016-03-01 ENCOUNTER — Encounter: Payer: Self-pay | Admitting: Internal Medicine

## 2016-03-06 ENCOUNTER — Ambulatory Visit: Payer: Self-pay | Admitting: Internal Medicine

## 2016-03-22 ENCOUNTER — Emergency Department (HOSPITAL_COMMUNITY): Admission: EM | Admit: 2016-03-22 | Discharge: 2016-03-22 | Discharge status: Absent without leave | Payer: Self-pay

## 2016-03-22 NOTE — ED Notes (Signed)
Called for triage, no response.

## 2016-03-22 NOTE — ED Triage Notes (Signed)
Unable to locate pt  

## 2016-03-22 NOTE — ED Notes (Signed)
Pt reports to [facility] and reports he has to "go talk to my people" and walked to parking lot.

## 2016-03-26 ENCOUNTER — Other Ambulatory Visit: Payer: Self-pay | Admitting: Internal Medicine

## 2016-03-26 DIAGNOSIS — B20 Human immunodeficiency virus [HIV] disease: Secondary | ICD-10-CM

## 2016-04-19 ENCOUNTER — Other Ambulatory Visit: Payer: Self-pay | Admitting: Internal Medicine

## 2016-04-19 DIAGNOSIS — B20 Human immunodeficiency virus [HIV] disease: Secondary | ICD-10-CM

## 2016-04-19 NOTE — Telephone Encounter (Signed)
Refills denied.  Patient will need office visit with Dr Megan Salon scheduled before we give a 30 day supply. This will be his last refill until he has office visit.  He will need office visit only and will do labs same day if needed.   Laverle Patter, RN

## 2016-04-23 ENCOUNTER — Other Ambulatory Visit: Payer: Self-pay | Admitting: Internal Medicine

## 2016-04-23 DIAGNOSIS — I10 Essential (primary) hypertension: Secondary | ICD-10-CM

## 2016-04-24 ENCOUNTER — Other Ambulatory Visit: Payer: Self-pay | Admitting: Internal Medicine

## 2016-04-24 DIAGNOSIS — I10 Essential (primary) hypertension: Secondary | ICD-10-CM

## 2016-04-25 ENCOUNTER — Other Ambulatory Visit: Payer: Self-pay | Admitting: Internal Medicine

## 2016-04-25 DIAGNOSIS — I10 Essential (primary) hypertension: Secondary | ICD-10-CM

## 2016-04-26 ENCOUNTER — Other Ambulatory Visit: Payer: Self-pay | Admitting: *Deleted

## 2016-04-26 DIAGNOSIS — B20 Human immunodeficiency virus [HIV] disease: Secondary | ICD-10-CM

## 2016-04-26 MED ORDER — ABACAVIR-DOLUTEGRAVIR-LAMIVUD 600-50-300 MG PO TABS: 1.0000 | ORAL_TABLET | Freq: Every day | ORAL | 0 refills | Status: DC

## 2016-05-10 ENCOUNTER — Other Ambulatory Visit (INDEPENDENT_AMBULATORY_CARE_PROVIDER_SITE_OTHER): Payer: Self-pay

## 2016-05-10 DIAGNOSIS — Z113 Encounter for screening for infections with a predominantly sexual mode of transmission: Secondary | ICD-10-CM

## 2016-05-10 DIAGNOSIS — Z79899 Other long term (current) drug therapy: Secondary | ICD-10-CM

## 2016-05-10 DIAGNOSIS — B20 Human immunodeficiency virus [HIV] disease: Secondary | ICD-10-CM

## 2016-05-10 DIAGNOSIS — Z21 Asymptomatic human immunodeficiency virus [HIV] infection status: Secondary | ICD-10-CM

## 2016-05-10 LAB — COMPREHENSIVE METABOLIC PANEL
ALBUMIN: 3.8 g/dL (ref 3.6–5.1)
ALT: 20 U/L (ref 9–46)
AST: 19 U/L (ref 10–40)
Alkaline Phosphatase: 75 U/L (ref 40–115)
BILIRUBIN TOTAL: 0.2 mg/dL (ref 0.2–1.2)
BUN: 9 mg/dL (ref 7–25)
CO2: 22 mmol/L (ref 20–31)
CREATININE: 1.42 mg/dL — AB (ref 0.60–1.35)
Calcium: 9 mg/dL (ref 8.6–10.3)
Chloride: 108 mmol/L (ref 98–110)
Glucose, Bld: 76 mg/dL (ref 65–99)
Potassium: 4.1 mmol/L (ref 3.5–5.3)
SODIUM: 140 mmol/L (ref 135–146)
TOTAL PROTEIN: 6.3 g/dL (ref 6.1–8.1)

## 2016-05-10 LAB — CBC
HCT: 41.3 % (ref 38.5–50.0)
HEMOGLOBIN: 13.7 g/dL (ref 13.2–17.1)
MCH: 32.4 pg (ref 27.0–33.0)
MCHC: 33.2 g/dL (ref 32.0–36.0)
MCV: 97.6 fL (ref 80.0–100.0)
MPV: 10.3 fL (ref 7.5–12.5)
Platelets: 211 10*3/uL (ref 140–400)
RBC: 4.23 MIL/uL (ref 4.20–5.80)
RDW: 14.5 % (ref 11.0–15.0)
WBC: 8.1 10*3/uL (ref 3.8–10.8)

## 2016-05-10 LAB — LIPID PANEL
CHOLESTEROL: 206 mg/dL — AB (ref 125–200)
HDL: 33 mg/dL — ABNORMAL LOW (ref >=40)
LDL Cholesterol: 132 mg/dL — ABNORMAL HIGH (ref <130)
TRIGLYCERIDES: 204 mg/dL — AB (ref <150)
Total CHOL/HDL Ratio: 6.2 Ratio — ABNORMAL HIGH (ref <=5.0)
VLDL: 41 mg/dL — AB (ref <30)

## 2016-05-11 LAB — T-HELPER CELL (CD4) - (RCID CLINIC ONLY)
CD4 T CELL ABS: 840 /uL (ref 400–2700)
CD4 T CELL HELPER: 25 % — AB (ref 33–55)

## 2016-05-11 LAB — URINE CYTOLOGY ANCILLARY ONLY
Chlamydia: NEGATIVE
Neisseria Gonorrhea: NEGATIVE

## 2016-05-11 LAB — RPR

## 2016-05-14 LAB — HIV-1 RNA QUANT-NO REFLEX-BLD
HIV 1 RNA Quant: 94 copies/mL — ABNORMAL HIGH (ref <20)
HIV-1 RNA QUANT, LOG: 1.97 {Log_copies}/mL — AB (ref <1.30)

## 2016-05-23 ENCOUNTER — Other Ambulatory Visit: Payer: Self-pay | Admitting: Internal Medicine

## 2016-05-23 DIAGNOSIS — B20 Human immunodeficiency virus [HIV] disease: Secondary | ICD-10-CM

## 2016-05-24 ENCOUNTER — Encounter: Payer: Self-pay | Admitting: Internal Medicine

## 2016-05-24 ENCOUNTER — Ambulatory Visit (INDEPENDENT_AMBULATORY_CARE_PROVIDER_SITE_OTHER): Payer: Self-pay | Admitting: Internal Medicine

## 2016-05-24 DIAGNOSIS — K604 Rectal fistula: Secondary | ICD-10-CM

## 2016-05-24 DIAGNOSIS — Z23 Encounter for immunization: Secondary | ICD-10-CM

## 2016-05-24 DIAGNOSIS — N289 Disorder of kidney and ureter, unspecified: Secondary | ICD-10-CM

## 2016-05-24 DIAGNOSIS — I10 Essential (primary) hypertension: Secondary | ICD-10-CM

## 2016-05-24 DIAGNOSIS — B20 Human immunodeficiency virus [HIV] disease: Secondary | ICD-10-CM

## 2016-05-24 NOTE — Assessment & Plan Note (Signed)
I encouraged him to set up a follow-up visit with his surgeon at [facility].

## 2016-05-24 NOTE — Assessment & Plan Note (Signed)
His CD4 count remains well up in the normal range. He has had a small blip in his viral load to 94. He recalls that he was beginning to develop his cold symptoms when he was in 2 weeks ago for his blood draw. I will continue Triumeq and have him follow-up after lab work in 6 months. He received his influenza vaccine today.

## 2016-05-24 NOTE — Progress Notes (Signed)
Patient Active Problem List   Diagnosis Date Noted  . Human immunodeficiency virus (HIV) disease ([place]) 08/28/2006    Priority: High  . Rectal fistula 05/24/2016    Priority: Medium  . Renal insufficiency 05/17/2015    Priority: Medium  . Renal cell carcinoma of right kidney ([place]) 08/19/2013    Priority: Medium  . Essential hypertension, benign 04/28/2008    Priority: Medium  . Renal cyst 05/21/2013  . Obesity (BMI 35.0-39.9 without comorbidity) 05/26/2012  . Diarrhea 11/07/2011  . Right knee pain 04/30/2011  . Recurrent boils 06/10/2009  . Internal hemorrhoids 12/01/2008  . DEPRESSION, ACUTE 12/20/2006    Patient's Medications  New Prescriptions   No medications on file  Previous Medications   AMLODIPINE (NORVASC) 5 MG TABLET    TAKE 1 TABLET(5 MG) BY MOUTH AT BEDTIME   VITAMIN C (ASCORBIC ACID) 500 MG TABLET    Take 500 mg by mouth daily.  Modified Medications   Modified Medication Previous Medication   TRIUMEQ 600-50-300 MG TABLET abacavir-dolutegravir-lamiVUDine (TRIUMEQ) 600-50-300 MG tablet      TAKE 1 TABLET BY MOUTH DAILY    Take 1 tablet by mouth daily.  Discontinued Medications   AZITHROMYCIN (ZITHROMAX) 250 MG TABLET    TAKE 1 PILL DAILY FOR 4 DAYS STARTING ON 6/25   BENZONATATE (TESSALON) 100 MG CAPSULE    Take 1 capsule (100 mg total) by mouth every 8 (eight) hours.   CEPHALEXIN (KEFLEX) 500 MG CAPSULE    Take 1 capsule (500 mg total) by mouth 2 (two) times daily.   PREDNISONE (STERAPRED UNI-PAK 21 TAB) 10 MG (21) TBPK TABLET    Take 1 tablet (10 mg total) by mouth daily. Take 6 tabs by mouth daily  for 2 days, then 5 tabs for 2 days, then 4 tabs for 2 days, then 3 tabs for 2 days, 2 tabs for 2 days, then 1 tab by mouth daily for 2 days    Subjective: Drew Cuevas is in for his routine HIV follow-up visit. At his last visit he did change from Atripla to Triumeq. He has been tolerating it well without any side effects. He takes it each evening. He recalls  missing only one dose when he was out of town unexpectedly. Last December he underwent a fistulectomy at [facility]. Unfortunately he continues to have perirectal pain and some leakage. He has not gone back for another visit. He states that he is concerned if he underwent another procedure they might clip his anal sphincter leaving him with long-term, permanent incontinence. He is just getting over a recent cold. He has not been missing doses of his blood pressure medication. He monitors his blood pressure at home and it has been within goal range. He has not been sexually active since his last visit.  Review of Systems: Review of Systems  Constitutional: Negative for chills, diaphoresis, fever, malaise/fatigue and weight loss.  HENT: Positive for congestion. Negative for sore throat.   Respiratory: Negative for cough, sputum production and shortness of breath.   Cardiovascular: Negative for chest pain.  Gastrointestinal: Negative for abdominal pain, diarrhea, nausea and vomiting.  Genitourinary: Negative for dysuria and frequency.       Rectal leakage and pain as noted in history of present illness.  Musculoskeletal: Negative for joint pain and myalgias.  Skin: Negative for rash.  Neurological: Negative for dizziness and headaches.  Psychiatric/Behavioral: Negative for depression and substance abuse. The  patient is not nervous/anxious.     Past Medical History:  Diagnosis Date  . Bronchitis    LAST FLARE UP WAS NEW YEARS 2015  . Cancer ([place])    kidney  . GERD (gastroesophageal reflux disease)    NO MEDS  . HIV (human immunodeficiency virus infection) ([place])    UNDER CONTROL WITH MEDICATIONS  . Hypertension   . Immune deficiency disorder ([place])   . Renal insufficiency 05/17/2015  . Renal mass, right     Social History  Substance Use Topics  . Smoking status: Former Smoker    Packs/day: 0.50    Years: 30.00    Types: Cigarettes    Quit date:  11/28/2015  . Smokeless tobacco: Never Used     Comment: stopped May 2017  . Alcohol use No     Comment: stopped 10-12 years ago    Family History  Problem Relation Age of Onset  . Hypertension Mother   . Hypertension Sister   . Hypertension Brother      Objective:  Vitals:   05/24/16 1506  BP: 114/71  Pulse: (!) 102  Temp: 98.3 F (36.8 C)  TempSrc: Oral  Weight: 204 lb 6 oz (92.7 kg)  Height: 5\' 6"  (1.676 m)   Body mass index is 32.99 kg/m.  Physical Exam  Constitutional: He is oriented to person, place, and time.  He is in good spirits.  HENT:  Mouth/Throat: No oropharyngeal exudate.  Eyes: Conjunctivae are normal.  Cardiovascular: Normal rate and regular rhythm.   No murmur heard. Pulmonary/Chest: Effort normal and breath sounds normal. He has no wheezes. He has no rales.  Abdominal: Soft. He exhibits no mass. There is no tenderness.  Musculoskeletal: Normal range of motion. He exhibits no edema or tenderness.  Neurological: He is alert and oriented to person, place, and time. Gait normal.  Skin: No rash noted.  No change in chronic dry skin on hands.  Psychiatric: Mood and affect normal.    Lab Results Lab Results  Component Value Date   WBC 8.1 05/10/2016   HGB 13.7 05/10/2016   HCT 41.3 05/10/2016   MCV 97.6 05/10/2016   PLT 211 05/10/2016    Lab Results  Component Value Date   CREATININE 1.42 (H) 05/10/2016   BUN 9 05/10/2016   NA 140 05/10/2016   K 4.1 05/10/2016   CL 108 05/10/2016   CO2 22 05/10/2016    Lab Results  Component Value Date   ALT 20 05/10/2016   AST 19 05/10/2016   ALKPHOS 75 05/10/2016   BILITOT 0.2 05/10/2016    Lab Results  Component Value Date   CHOL 206 (H) 05/10/2016   HDL 33 (L) 05/10/2016   LDLCALC 132 (H) 05/10/2016   TRIG 204 (H) 05/10/2016   CHOLHDL 6.2 (H) 05/10/2016   HIV 1 RNA Quant (copies/mL)  Date Value  05/10/2016 94 (H)  02/23/2016 <20  03/30/2015 <20   CD4 T Cell Abs (/uL)  Date Value    05/10/2016 840  02/23/2016 730  03/30/2015 770     Problem List Items Addressed This Visit      High   Human immunodeficiency virus (HIV) disease ([place])    His CD4 count remains well up in the normal range. He has had a small blip in his viral load to 94. He recalls that he was beginning to develop his cold symptoms when he was in 2 weeks ago for his blood draw. I will continue Triumeq and have  him follow-up after lab work in 6 months. He received his influenza vaccine today.      Relevant Orders   T-helper cell (CD4)- ([facility] clinic only)   HIV 1 RNA quant-no reflex-bld   CBC   Comprehensive metabolic panel   Lipid panel   RPR     Medium   Essential hypertension, benign    His blood pressure is within goal. He will continue amlodipine.      Rectal fistula    I encouraged him to set up a follow-up visit with his surgeon at [facility].      Renal insufficiency    He has chronic, stable renal insufficiency that could be related to HIV infection, hypertension, and/or the fact that he had a nephrectomy for renal carcinoma several years ago.       Other Visit Diagnoses   None.       Michel Bickers, MD [facility] [facility] [phone] pager   [phone] cell 05/24/2016, 3:38 PM

## 2016-05-24 NOTE — Assessment & Plan Note (Signed)
He has chronic, stable renal insufficiency that could be related to HIV infection, hypertension, and/or the fact that he had a nephrectomy for renal carcinoma several years ago.

## 2016-05-24 NOTE — Assessment & Plan Note (Signed)
His blood pressure is within goal. He will continue amlodipine.

## 2016-06-14 ENCOUNTER — Ambulatory Visit: Payer: Self-pay | Attending: Internal Medicine | Admitting: Internal Medicine

## 2016-06-14 ENCOUNTER — Encounter: Payer: Self-pay | Admitting: Internal Medicine

## 2016-06-14 VITALS — BP 113/75 | HR 77 | Temp 98.1°F | Resp 18 | Ht 66.0 in | Wt 205.0 lb

## 2016-06-14 DIAGNOSIS — E785 Hyperlipidemia, unspecified: Secondary | ICD-10-CM | POA: Insufficient documentation

## 2016-06-14 DIAGNOSIS — F331 Major depressive disorder, recurrent, moderate: Secondary | ICD-10-CM | POA: Insufficient documentation

## 2016-06-14 DIAGNOSIS — Z79899 Other long term (current) drug therapy: Secondary | ICD-10-CM | POA: Insufficient documentation

## 2016-06-14 DIAGNOSIS — N289 Disorder of kidney and ureter, unspecified: Secondary | ICD-10-CM | POA: Insufficient documentation

## 2016-06-14 DIAGNOSIS — B20 Human immunodeficiency virus [HIV] disease: Secondary | ICD-10-CM | POA: Insufficient documentation

## 2016-06-14 DIAGNOSIS — K219 Gastro-esophageal reflux disease without esophagitis: Secondary | ICD-10-CM | POA: Insufficient documentation

## 2016-06-14 DIAGNOSIS — I10 Essential (primary) hypertension: Secondary | ICD-10-CM | POA: Insufficient documentation

## 2016-06-14 MED ORDER — AMLODIPINE BESYLATE 5 MG PO TABS: 5.0000 mg | ORAL_TABLET | Freq: Every day | ORAL | 3 refills | Status: DC

## 2016-06-14 MED ORDER — CITALOPRAM HYDROBROMIDE 20 MG PO TABS: 20.0000 mg | ORAL_TABLET | Freq: Every day | ORAL | 3 refills | Status: DC

## 2016-06-14 MED ORDER — PRAVASTATIN SODIUM 20 MG PO TABS: 20.0000 mg | ORAL_TABLET | Freq: Every day | ORAL | 3 refills | Status: DC

## 2016-06-14 NOTE — Progress Notes (Signed)
Drew Cuevas, is a 45 y.o. male  [id number]  [id number]  DOB - 1971-05-23  Chief Complaint  Patient presents with  . Hypertension      Subjective:   Drew Cuevas is a 45 y.o. male with history of hypertension, hyperlipidemia and HIV disease well controlled on current medications here today for a follow up visit and medication refill. Patient has no new complaint today. He has been lost to follow up for over a year. He claims adherent with medications, reports no side effect. Patient has No headache, No chest pain, No abdominal pain - No Nausea, No new weakness tingling or numbness, No Cough - SOB. Patient also need refills of his antidepressants, he denies any suicidal ideation or thought.  Problem  Dyslipidemia  HIV disease ([place])  Essential Hypertension   Qualifier: Diagnosis of  By: Tomma Lightning MD, Claiborne Billings      ALLERGIES:  PAST MEDICAL HISTORY: Past Medical History:  Diagnosis Date  . Bronchitis    LAST FLARE UP WAS NEW YEARS 2015  . Cancer ([place])    kidney  . GERD (gastroesophageal reflux disease)    NO MEDS  . HIV (human immunodeficiency virus infection) ([place])    UNDER CONTROL WITH MEDICATIONS  . Hypertension   . Immune deficiency disorder ([place])   . Renal insufficiency 05/17/2015  . Renal mass, right    MEDICATIONS AT HOME: Prior to Admission medications   Medication Sig Start Date End Date Taking? Authorizing Provider  amLODipine (NORVASC) 5 MG tablet Take 1 tablet (5 mg total) by mouth daily. 06/14/16  Yes Tresa Garter, MD  TRIUMEQ 600-50-300 MG tablet TAKE 1 TABLET BY MOUTH DAILY 05/24/16  Yes Michel Bickers, MD  vitamin C (ASCORBIC ACID) 500 MG tablet Take 500 mg by mouth daily.   Yes Historical Provider, MD  citalopram (CELEXA) 20 MG tablet Take 1 tablet (20 mg total) by mouth daily. 06/14/16   Tresa Garter, MD  pravastatin (PRAVACHOL) 20 MG tablet Take 1 tablet (20 mg total) by mouth daily. 06/14/16   Tresa Garter, MD   Objective:     Vitals:   06/14/16 1614  BP: 113/75  Pulse: 77  Resp: 18  Temp: 98.1 F (36.7 C)  TempSrc: Oral  SpO2: 98%  Weight: 205 lb (93 kg)  Height: 5\' 6"  (1.676 m)   Exam General appearance : Awake, alert, not in any distress. Speech Clear. Not toxic looking HEENT: Atraumatic and Normocephalic, pupils equally reactive to light and accomodation Neck: Supple, no JVD. No cervical lymphadenopathy.  Chest: Good air entry bilaterally, no added sounds  CVS: S1 S2 regular, no murmurs.  Abdomen: Bowel sounds present, Non tender and not distended with no gaurding, rigidity or rebound. Extremities: B/L Lower Ext shows no edema, both legs are warm to touch Neurology: Awake alert, and oriented X 3, CN II-XII intact, Non focal Skin: No Rash  Data Review No results found for: HGBA1C  Assessment & Plan   1. Essential hypertension  - amLODipine (NORVASC) 5 MG tablet; Take 1 tablet (5 mg total) by mouth daily.  Dispense: 90 tablet; Refill: 3  We have discussed target BP range and blood pressure goal. I have advised patient to check BP regularly and to call us back or report to clinic if the numbers are consistently higher than 140/90. We discussed the importance of compliance with medical therapy and DASH diet recommended, consequences of uncontrolled hypertension discussed.  - continue current BP medications  2. HIV disease ([place])  -  Continue follow-up with ID Specialist and current management  3. Dyslipidemia  - pravastatin (PRAVACHOL) 20 MG tablet; Take 1 tablet (20 mg total) by mouth daily.  Dispense: 90 tablet; Refill: 3  4. Moderate episode of recurrent major depressive disorder (HCC)  - citalopram (CELEXA) 20 MG tablet; Take 1 tablet (20 mg total) by mouth daily.  Dispense: 90 tablet; Refill: 3  Patient have been counseled extensively about nutrition and exercise. Other issues discussed during this visit include: low cholesterol diet, weight control and daily exercise, importance of  adherence with medications and regular follow-up. We also discussed long term complications of uncontrolled hypertension.   Return in about 6 months (around 12/12/2016) for Follow up HTN, Dyslipidemia.  The patient was given clear instructions to go to ER or return to medical center if symptoms don't improve, worsen or new problems develop. The patient verbalized understanding. The patient was told to call to get lab results if they haven't heard anything in the next week.   This note has been created with Surveyor, quantity. Any transcriptional errors are unintentional.    Angelica Chessman, MD, [place], Karilyn Cota, [place] and [place]ka [place], [place]   06/14/2016, 4:50 PM

## 2016-06-14 NOTE — Patient Instructions (Signed)
DASH Eating Plan DASH stands for "Dietary Approaches to Stop Hypertension." The DASH eating plan is a healthy eating plan that has been shown to reduce high blood pressure (hypertension). Additional health benefits may include reducing the risk of type 2 diabetes mellitus, heart disease, and stroke. The DASH eating plan may also help with weight loss. What do I need to know about the DASH eating plan? For the DASH eating plan, you will follow these general guidelines:  Choose foods with less than 150 milligrams of sodium per serving (as listed on the food label).  Use salt-free seasonings or herbs instead of table salt or sea salt.  Check with your health care provider or pharmacist before using salt substitutes.  Eat lower-sodium products. These are often labeled as "low-sodium" or "no salt added."  Eat fresh foods. Avoid eating a lot of canned foods.  Eat more vegetables, fruits, and low-fat dairy products.  Choose whole grains. Look for the word "whole" as the first word in the ingredient list.  Choose fish and skinless chicken or turkey more often than red meat. Limit fish, poultry, and meat to 6 oz (170 g) each day.  Limit sweets, desserts, sugars, and sugary drinks.  Choose heart-healthy fats.  Eat more home-cooked food and less restaurant, buffet, and fast food.  Limit fried foods.  Do not fry foods. Cook foods using methods such as baking, boiling, grilling, and broiling instead.  When eating at a restaurant, ask that your food be prepared with less salt, or no salt if possible. What foods can I eat? Seek help from a dietitian for individual calorie needs. Grains  Whole grain or whole wheat bread. Brown rice. Whole grain or whole wheat pasta. Quinoa, bulgur, and whole grain cereals. Low-sodium cereals. Corn or whole wheat flour tortillas. Whole grain cornbread. Whole grain crackers. Low-sodium crackers. Vegetables  Fresh or frozen vegetables (raw, steamed, roasted, or  grilled). Low-sodium or reduced-sodium tomato and vegetable juices. Low-sodium or reduced-sodium tomato sauce and paste. Low-sodium or reduced-sodium canned vegetables. Fruits  All fresh, canned (in natural juice), or frozen fruits. Meat and Other Protein Products  Ground beef (85% or leaner), grass-fed beef, or beef trimmed of fat. Skinless chicken or turkey. Ground chicken or turkey. Pork trimmed of fat. All fish and seafood. Eggs. Dried beans, peas, or lentils. Unsalted nuts and seeds. Unsalted canned beans. Dairy  Low-fat dairy products, such as skim or 1% milk, 2% or reduced-fat cheeses, low-fat ricotta or cottage cheese, or plain low-fat yogurt. Low-sodium or reduced-sodium cheeses. Fats and Oils  Tub margarines without trans fats. Light or reduced-fat mayonnaise and salad dressings (reduced sodium). Avocado. Safflower, olive, or canola oils. Natural peanut or almond butter. Other  Unsalted popcorn and pretzels. The items listed above may not be a complete list of recommended foods or beverages. Contact your dietitian for more options.  What foods are not recommended? Grains  White bread. White pasta. White rice. Refined cornbread. Bagels and croissants. Crackers that contain trans fat. Vegetables  Creamed or fried vegetables. Vegetables in a cheese sauce. Regular canned vegetables. Regular canned tomato sauce and paste. Regular tomato and vegetable juices. Fruits  Canned fruit in light or heavy syrup. Fruit juice. Meat and Other Protein Products  Fatty cuts of meat. Ribs, chicken wings, bacon, sausage, bologna, salami, chitterlings, fatback, hot dogs, bratwurst, and packaged luncheon meats. Salted nuts and seeds. Canned beans with salt. Dairy  Whole or 2% milk, cream, half-and-half, and cream cheese. Whole-fat or sweetened yogurt. Full-fat cheeses   or blue cheese. Nondairy creamers and whipped toppings. Processed cheese, cheese spreads, or cheese curds. Condiments  Onion and garlic  salt, seasoned salt, table salt, and sea salt. Canned and packaged gravies. Worcestershire sauce. Tartar sauce. Barbecue sauce. Teriyaki sauce. Soy sauce, including reduced sodium. Steak sauce. Fish sauce. Oyster sauce. Cocktail sauce. Horseradish. Ketchup and mustard. Meat flavorings and tenderizers. Bouillon cubes. Hot sauce. Tabasco sauce. Marinades. Taco seasonings. Relishes. Fats and Oils  Butter, stick margarine, lard, shortening, ghee, and bacon fat. Coconut, palm kernel, or palm oils. Regular salad dressings. Other  Pickles and olives. Salted popcorn and pretzels. The items listed above may not be a complete list of foods and beverages to avoid. Contact your dietitian for more information.  Where can I find more information? National Heart, Lung, and Blood Institute: travelstabloid.com This information is not intended to replace advice given to you by your health care provider. Make sure you discuss any questions you have with your health care provider. Document Released: 07/05/2011 Document Revised: 12/22/2015 Document Reviewed: 05/20/2013 Elsevier Interactive Patient Education  2017 [employer] Dyslipidemia Dyslipidemia is an imbalance of waxy, fat-like substances (lipids) in the blood. The body needs lipids in small amounts. Dyslipidemia often involves a high level of cholesterol or triglycerides, which are types of lipids. Common forms of dyslipidemia include:  High levels of bad cholesterol (LDL cholesterol). LDL is the type of cholesterol that causes fatty deposits (plaques) to build up in the blood vessels that carry blood away from your heart (arteries).  Low levels of good cholesterol (HDL cholesterol). HDL cholesterol is the type of cholesterol that protects against heart disease. High levels of HDL remove the LDL buildup from arteries.  High levels of triglycerides. Triglycerides are a fatty substance in the blood that is linked to a buildup  of plaques in the arteries. You can develop dyslipidemia because of the genes you are born with (primary dyslipidemia) or changes that occur during your life (secondary dyslipidemia), or as a side effect of certain medical treatments. What are the causes? Primary dyslipidemia is caused by changes (mutations) in genes that are passed down through families (inherited). These mutations cause several types of dyslipidemia. Mutations can result in disorders that make the body produce too much LDL cholesterol or triglycerides, or not enough HDL cholesterol. These disorders may lead to heart disease, arterial disease, or stroke at an early age. Causes of secondary dyslipidemia include certain lifestyle choices and diseases that lead to dyslipidemia, such as:  Eating a diet that is high in animal fat.  Not getting enough activity or exercise (having a sedentary lifestyle).  Having diabetes, kidney disease, liver disease, or thyroid disease.  Drinking large amounts of alcohol.  Using certain types of drugs. What increases the risk? You may be at greater risk for dyslipidemia if you are an older man or if you are a woman who has gone through menopause. Other risk factors include:  Having a family history of dyslipidemia.  Taking certain medicines, including birth control pills, steroids, some diuretics, beta-blockers, and some medicines forHIV.  Smoking cigarettes.  Eating a high-fat diet.  Drinking large amounts of alcohol.  Having certain medical conditions such as diabetes, polycystic ovary syndrome (PCOS), pregnancy, kidney disease, liver disease, or hypothyroidism.  Not exercising regularly.  Being overweight or obese with too much belly fat. What are the signs or symptoms? Dyslipidemia does not usually cause any symptoms. Very high lipid levels can cause fatty bumps under the skin (xanthomas) or a white or  gray ring around the black center (pupil) of the eye. Very high triglyceride  levels can cause inflammation of the pancreas (pancreatitis). How is this diagnosed? Your health care provider may diagnose dyslipidemia based on a routine blood test (fasting blood test). Because most people do not have symptoms of the condition, this blood testing (lipid profile) is done on adults age 38 and older and is repeated every 5 years. This test checks:  Total cholesterol. This is a measure of the total amount of cholesterol in your blood, including LDL cholesterol, HDL cholesterol, and triglycerides. A healthy number is below 200.  LDL cholesterol. The target number for LDL cholesterol is different for each person, depending on individual risk factors. For most people, a number below 100 is healthy. Ask your health care provider what your LDL cholesterol number should be.  HDL cholesterol. An HDL level of 60 or higher is best because it helps to protect against heart disease. A number below 40 for men or below 76 for women increases the risk for heart disease.  Triglycerides. A healthy triglyceride number is below 150. If your lipid profile is abnormal, your health care provider may do other blood tests to get more information about your condition. How is this treated? Treatment depends on the type of dyslipidemia that you have and your other risk factors for heart disease and stroke. Your health care provider will have a target range for your lipid levels based on this information. For many people, treatment starts with lifestyle changes, such as diet and exercise. Your health care provider may recommend that you:  Get regular exercise.  Make changes to your diet.  Quit smoking if you smoke. If diet changes and exercise do not help you reach your goals, your health care provider may also prescribe medicine to lower lipids. The most commonly prescribed type of medicine lowers your LDL cholesterol (statin drug). If you have a high triglyceride level, your provider may prescribe  another type of drug (fibrate) or an omega-3 fish oil supplement, or both. Follow these instructions at home:  Take over-the-counter and prescription medicines only as told by your health care provider. This includes supplements.  Get regular exercise. Start an aerobic exercise and strength training program as told by your health care provider. Ask your health care provider what activities are safe for you. Your health care provider may recommend:  30 minutes of aerobic activity 4-6 days a week. Brisk walking is an example of aerobic activity.  Strength training 2 days a week.  Eat a healthy diet as told by your health care provider. This can help you reach and maintain a healthy weight, lower your LDL cholesterol, and raise your HDL cholesterol. It may help to work with a diet and nutrition specialist (dietitian) to make a plan that is right for you. Your dietitian or health care provider may recommend:  Limiting your calories, if you are overweight.  Eating more fruits, vegetables, whole grains, fish, and lean meats.  Limiting saturated fat, trans fat, and cholesterol.  Follow instructions from your health care provider or dietitian about eating or drinking restrictions.  Limit alcohol intake to no more than one drink per day for nonpregnant women and two drinks per day for men. One drink equals 12 oz of beer, 5 oz of wine, or 1 oz of hard liquor.  Do not use any products that contain nicotine or tobacco, such as cigarettes and e-cigarettes. If you need help quitting, ask your health care provider.  Keep all follow-up visits as told by your health care provider. This is important. Contact a health care provider if:  You are having trouble sticking to your exercise or diet plan.  You are struggling to quit smoking or control your use of alcohol. Summary  Dyslipidemia is an imbalance of waxy, fat-like substances (lipids) in the blood. The body needs lipids in small amounts.  Dyslipidemia often involves a high level of cholesterol or triglycerides, which are types of lipids.  Treatment depends on the type of dyslipidemia that you have and your other risk factors for heart disease and stroke.  For many people, treatment starts with lifestyle changes, such as diet and exercise. Your health care provider may also prescribe medicine to lower lipids. This information is not intended to replace advice given to you by your health care provider. Make sure you discuss any questions you have with your health care provider. Document Released: 07/21/2013 Document Revised: 03/12/2016 Document Reviewed: 03/12/2016 Elsevier Interactive Patient Education  2017 [employer]. Hypertension Hypertension, commonly called high blood pressure, is when the force of blood pumping through your arteries is too strong. Your arteries are the blood vessels that carry blood from your heart throughout your body. A blood pressure reading consists of a higher number over a lower number, such as 110/72. The higher number (systolic) is the pressure inside your arteries when your heart pumps. The lower number (diastolic) is the pressure inside your arteries when your heart relaxes. Ideally you want your blood pressure below 120/80. Hypertension forces your heart to work harder to pump blood. Your arteries may become narrow or stiff. Having untreated or uncontrolled hypertension can cause heart attack, stroke, kidney disease, and other problems. What increases the risk? Some risk factors for high blood pressure are controllable. Others are not. Risk factors you cannot control include:  Race. You may be at higher risk if you are African American.  Age. Risk increases with age.  Gender. Men are at higher risk than women before age 70 years. After age 63, women are at higher risk than men. Risk factors you can control include:  Not getting enough exercise or physical activity.  Being  overweight.  Getting too much fat, sugar, calories, or salt in your diet.  Drinking too much alcohol. What are the signs or symptoms? Hypertension does not usually cause signs or symptoms. Extremely high blood pressure (hypertensive crisis) may cause headache, anxiety, shortness of breath, and nosebleed. How is this diagnosed? To check if you have hypertension, your health care provider will measure your blood pressure while you are seated, with your arm held at the level of your heart. It should be measured at least twice using the same arm. Certain conditions can cause a difference in blood pressure between your right and left arms. A blood pressure reading that is higher than normal on one occasion does not mean that you need treatment. If it is not clear whether you have high blood pressure, you may be asked to return on a different day to have your blood pressure checked again. Or, you may be asked to monitor your blood pressure at home for 1 or more weeks. How is this treated? Treating high blood pressure includes making lifestyle changes and possibly taking medicine. Living a healthy lifestyle can help lower high blood pressure. You may need to change some of your habits. Lifestyle changes may include:  Following the DASH diet. This diet is high in fruits, vegetables, and whole grains. It is  low in salt, red meat, and added sugars.  Keep your sodium intake below 2,300 mg per day.  Getting at least 30-45 minutes of aerobic exercise at least 4 times per week.  Losing weight if necessary.  Not smoking.  Limiting alcoholic beverages.  Learning ways to reduce stress. Your health care provider may prescribe medicine if lifestyle changes are not enough to get your blood pressure under control, and if one of the following is true:  You are 32-28 years of age and your systolic blood pressure is above 140.  You are 78 years of age or older, and your systolic blood pressure is above  150.  Your diastolic blood pressure is above 90.  You have diabetes, and your systolic blood pressure is over [id number] or your diastolic blood pressure is over 90.  You have kidney disease and your blood pressure is above 140/90.  You have heart disease and your blood pressure is above 140/90. Your personal target blood pressure may vary depending on your medical conditions, your age, and other factors. Follow these instructions at home:  Have your blood pressure rechecked as directed by your health care provider.  Take medicines only as directed by your health care provider. Follow the directions carefully. Blood pressure medicines must be taken as prescribed. The medicine does not work as well when you skip doses. Skipping doses also puts you at risk for problems.  Do not smoke.  Monitor your blood pressure at home as directed by your health care provider. Contact a health care provider if:  You think you are having a reaction to medicines taken.  You have recurrent headaches or feel dizzy.  You have swelling in your ankles.  You have trouble with your vision. Get help right away if:  You develop a severe headache or confusion.  You have unusual weakness, numbness, or feel faint.  You have severe chest or abdominal pain.  You vomit repeatedly.  You have trouble breathing. This information is not intended to replace advice given to you by your health care provider. Make sure you discuss any questions you have with your health care provider. Document Released: 07/16/2005 Document Revised: 12/22/2015 Document Reviewed: 05/08/2013 Elsevier Interactive Patient Education  2017 [employer].

## 2016-06-14 NOTE — Progress Notes (Signed)
Patient is here for HTN FU  Patient denies pain at this time.  Patient has not taken medication today. (takes at night) Patient has not eaten today.

## 2016-07-19 ENCOUNTER — Emergency Department (HOSPITAL_COMMUNITY): Admission: EM | Admit: 2016-07-19 | Discharge: 2016-07-20 | Discharge status: Against Medical Advice | Payer: Self-pay

## 2016-07-19 NOTE — ED Notes (Signed)
Called for patient to be triaged x 3 attempts. Will notify  RN of no responses

## 2016-07-19 NOTE — ED Notes (Signed)
Called for triage with no response times 2. Will re-attempt shortly

## 2016-09-10 ENCOUNTER — Encounter (HOSPITAL_COMMUNITY): Payer: Self-pay | Admitting: Emergency Medicine

## 2016-09-10 ENCOUNTER — Emergency Department (HOSPITAL_COMMUNITY)
Admission: EM | Admit: 2016-09-10 | Discharge: 2016-09-10 | Disposition: A | Discharge status: Home / Self Care | Payer: BLUE CROSS/BLUE SHIELD | Attending: Emergency Medicine | Admitting: Emergency Medicine

## 2016-09-10 DIAGNOSIS — Y999 Unspecified external cause status: Secondary | ICD-10-CM | POA: Insufficient documentation

## 2016-09-10 DIAGNOSIS — Y92009 Unspecified place in unspecified non-institutional (private) residence as the place of occurrence of the external cause: Secondary | ICD-10-CM | POA: Diagnosis not present

## 2016-09-10 DIAGNOSIS — S39012A Strain of muscle, fascia and tendon of lower back, initial encounter: Secondary | ICD-10-CM | POA: Diagnosis not present

## 2016-09-10 DIAGNOSIS — I10 Essential (primary) hypertension: Secondary | ICD-10-CM | POA: Insufficient documentation

## 2016-09-10 DIAGNOSIS — X501XXA Overexertion from prolonged static or awkward postures, initial encounter: Secondary | ICD-10-CM | POA: Insufficient documentation

## 2016-09-10 DIAGNOSIS — Z79899 Other long term (current) drug therapy: Secondary | ICD-10-CM | POA: Diagnosis not present

## 2016-09-10 DIAGNOSIS — Z8051 Family history of malignant neoplasm of kidney: Secondary | ICD-10-CM | POA: Insufficient documentation

## 2016-09-10 DIAGNOSIS — Y9389 Activity, other specified: Secondary | ICD-10-CM | POA: Diagnosis not present

## 2016-09-10 DIAGNOSIS — Z91040 Latex allergy status: Secondary | ICD-10-CM | POA: Diagnosis not present

## 2016-09-10 DIAGNOSIS — Z87891 Personal history of nicotine dependence: Secondary | ICD-10-CM | POA: Diagnosis not present

## 2016-09-10 DIAGNOSIS — S3992XA Unspecified injury of lower back, initial encounter: Secondary | ICD-10-CM | POA: Diagnosis present

## 2016-09-10 MED ORDER — CYCLOBENZAPRINE HCL 10 MG PO TABS: 10.0000 mg | ORAL_TABLET | Freq: Two times a day (BID) | ORAL | 0 refills | Status: DC | PRN

## 2016-09-10 MED ORDER — TRAMADOL HCL 50 MG PO TABS: 50.0000 mg | ORAL_TABLET | Freq: Four times a day (QID) | ORAL | 0 refills | Status: DC | PRN

## 2016-09-10 NOTE — ED Notes (Signed)
Bed: WA20 Expected date:  Expected time:  Means of arrival:  Comments: 

## 2016-09-10 NOTE — ED Provider Notes (Signed)
[facility] DEPT Provider Note   CSN: [id number] Arrival date & time: 09/10/16  0745     History   Chief Complaint Chief Complaint  Patient presents with  . Back Pain    HPI Drew Cuevas is a 46 y.o. male.  HPI   46 year old male with history of HIV appears to be well controlled, kidney cancer, hypertension presenting complaining of low back pain. Patient states last night he was pushing around a dresser that has a fish tank on top of that at his home. Afterward he reported having pain to his lower back area pain is described as a sharp tightness sensation to his low back radiates to his buttock worsening this morning when he was trying to get off the bed. He did take a Tylenol with minimal improvement. He denies any associated fever, chills, dysuria, hematuria, bowel bladder incontinence or saddle anesthesia. No history of IV drug use or active cancer. Patient states that his HIV status is well-controlled. States that his last CD4 count was in the 800s and viral load is undetectable. He also requesting for a work note.  Past Medical History:  Diagnosis Date  . Bronchitis    LAST FLARE UP WAS NEW YEARS 2015  . Cancer ([place])    kidney  . GERD (gastroesophageal reflux disease)    NO MEDS  . HIV (human immunodeficiency virus infection) ([place])    UNDER CONTROL WITH MEDICATIONS  . Hypertension   . Immune deficiency disorder ([place])   . Renal insufficiency 05/17/2015  . Renal mass, right     Patient Active Problem List   Diagnosis Date Noted  . Dyslipidemia 06/14/2016  . Rectal fistula 05/24/2016  . Renal insufficiency 05/17/2015  . Renal cell carcinoma of right kidney ([place]) 08/19/2013  . HIV disease ([place]) 05/21/2013  . Renal cyst 05/21/2013  . Obesity (BMI 35.0-39.9 without comorbidity) 05/26/2012  . Diarrhea 11/07/2011  . Right knee pain 04/30/2011  . Recurrent boils 06/10/2009  . Internal hemorrhoids 12/01/2008  . Essential hypertension 04/28/2008  . DEPRESSION, ACUTE  12/20/2006  . Human immunodeficiency virus (HIV) disease ([place]) 08/28/2006    Past Surgical History:  Procedure Laterality Date  . NO PAST SURGERIES    . ROBOT ASSISTED LAPAROSCOPIC NEPHRECTOMY Right 08/19/2013   Procedure: ROBOTIC ASSISTED LAPAROSCOPIC RIGHT PARTIAL NEPHRECTOMY;  Surgeon: Ardis Hughs, MD;  Location: [facility] ORS;  Service: Urology;  Laterality: Right;       Home Medications    Prior to Admission medications   Medication Sig Start Date End Date Taking? Authorizing Provider  amLODipine (NORVASC) 5 MG tablet Take 1 tablet (5 mg total) by mouth daily. 06/14/16   Tresa Garter, MD  citalopram (CELEXA) 20 MG tablet Take 1 tablet (20 mg total) by mouth daily. 06/14/16   Tresa Garter, MD  pravastatin (PRAVACHOL) 20 MG tablet Take 1 tablet (20 mg total) by mouth daily. 06/14/16   Tresa Garter, MD  TRIUMEQ 600-50-300 MG tablet TAKE 1 TABLET BY MOUTH DAILY 05/24/16   Michel Bickers, MD  vitamin C (ASCORBIC ACID) 500 MG tablet Take 500 mg by mouth daily.    Historical Provider, MD    Family History Family History  Problem Relation Age of Onset  . Hypertension Mother   . Hypertension Sister   . Hypertension Brother     Social History Social History  Substance Use Topics  . Smoking status: Former Smoker    Packs/day: 0.50    Years: 30.00    Types: Cigarettes  Quit date: 11/28/2015  . Smokeless tobacco: Never Used     Comment: stopped May 2017  . Alcohol use No     Comment: stopped 10-12 years ago     Allergies   Ibuprofen; Penicillins; and Latex   Review of Systems Review of Systems  Constitutional: Negative for fever.  Musculoskeletal: Positive for back pain.  Skin: Negative for rash and wound.  Neurological: Negative for numbness.     Physical Exam Updated Vital Signs BP 118/80 (BP Location: Left Arm)   Pulse 83   Temp 98.1 F (36.7 C) (Oral)   Resp 18   Ht 5\' 6"  (1.676 m)   Wt 90.7 kg   SpO2 100%   BMI 32.28 kg/m    Physical Exam  Constitutional: He appears well-developed and well-nourished. No distress.  HENT:  Head: Atraumatic.  Eyes: Conjunctivae are normal.  Neck: Neck supple.  Musculoskeletal: He exhibits tenderness (Tenderness to lumbar spine at the level of L3-S1 and right paralumbar spinal muscle on palpation without any overlying skin changes. Increasing pain with right hip flexion. Able to ambulate with a mild antalgic gait. Intact distal pulses and sensation.).  Neurological: He is alert.  Skin: No rash noted.  Psychiatric: He has a normal mood and affect.  Nursing note and vitals reviewed.    ED Treatments / Results  Labs (all labs ordered are listed, but only abnormal results are displayed) Labs Reviewed - No data to display  EKG  EKG Interpretation None       Radiology No results found.  Procedures Procedures (including critical care time)  Medications Ordered in ED Medications - No data to display   Initial Impression / Assessment and Plan / ED Course  I have reviewed the triage vital signs and the nursing notes.  Pertinent labs & imaging results that were available during my care of the patient were reviewed by me and considered in my medical decision making (see chart for details).     BP 118/80 (BP Location: Left Arm)   Pulse 83   Temp 98.1 F (36.7 C) (Oral)   Resp 18   Ht 5\' 6"  (1.676 m)   Wt 90.7 kg   SpO2 100%   BMI 32.28 kg/m    Final Clinical Impressions(s) / ED Diagnoses   Final diagnoses:  Strain of lumbar region, initial encounter    New Prescriptions New Prescriptions   CYCLOBENZAPRINE (FLEXERIL) 10 MG TABLET    Take 1 tablet (10 mg total) by mouth 2 (two) times daily as needed for muscle spasms.   TRAMADOL (ULTRAM) 50 MG TABLET    Take 1 tablet (50 mg total) by mouth every 6 (six) hours as needed.   10:22 AM Patient here with reproducible back pain after pushing a heavy dresser last night. I suspect this is likely muscle  skeletal. He has no red flags even though he has history of HIV is well controlled. Doubt infectious etiology causing his symptoms. No saddle anesthesia concerning for cauda equina. He is able to ambulate. Will provide symptomatic treatment, orthopedic referral given as needed. Work note provided as requested.   Domenic Moras, PA-C 09/10/16 [place], MD 09/10/16 [phone]

## 2016-09-10 NOTE — ED Triage Notes (Signed)
Pt states that last night he was moving furniture and has mid to lower back pain. Patient states pain is really bad esp with movement.

## 2016-09-26 ENCOUNTER — Ambulatory Visit (HOSPITAL_COMMUNITY)
Admission: EM | Admit: 2016-09-26 | Discharge: 2016-09-26 | Disposition: A | Discharge status: Home / Self Care | Payer: BLUE CROSS/BLUE SHIELD | Attending: Family Medicine | Admitting: Family Medicine

## 2016-09-26 DIAGNOSIS — M76891 Other specified enthesopathies of right lower limb, excluding foot: Secondary | ICD-10-CM | POA: Diagnosis not present

## 2016-09-26 DIAGNOSIS — M25561 Pain in right knee: Secondary | ICD-10-CM

## 2016-09-26 MED ORDER — TRAMADOL HCL 50 MG PO TABS: 50.0000 mg | ORAL_TABLET | Freq: Four times a day (QID) | ORAL | 0 refills | Status: DC | PRN

## 2016-09-26 NOTE — ED Triage Notes (Signed)
C/o right knee and foot pain States pain started Sunday Denies any injury

## 2016-09-26 NOTE — Discharge Instructions (Signed)
Wear the knee sleeve for the next several days or as long she needs to for pain. Apply ice to the knee for the next few days off and on. Tramadol for pain. Limit ambulation and bending and lifting for the next few days. Follow-up with primary care doctor.

## 2016-09-26 NOTE — ED Provider Notes (Signed)
CSN: [id number]     Arrival date & time 09/26/16  1522 History   First MD Initiated Contact with Patient 09/26/16 1623     Chief Complaint  Patient presents with  . Knee Pain  . Foot Pain   (Consider location/radiation/quality/duration/timing/severity/associated sxs/prior Treatment)  46 year old male complaining of a 3 day history of pain in the right knee. He states his pain developed after a full day of work. 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 Complaining of tenderness primarily to the anteromedial aspect of the knee. Denies any sort of trauma, no fall or blunt trauma. His second complaint is that of pain of the right foot located between the third and fourth toes. No swelling deformity or discoloration.      Past Medical History:  Diagnosis Date  . Bronchitis    LAST FLARE UP WAS NEW YEARS 2015  . Cancer ([place])    kidney  . GERD (gastroesophageal reflux disease)    NO MEDS  . HIV (human immunodeficiency virus infection) ([place])    UNDER CONTROL WITH MEDICATIONS  . Hypertension   . Immune deficiency disorder ([place])   . Renal insufficiency 05/17/2015  . Renal mass, right    Past Surgical History:  Procedure Laterality Date  . NO PAST SURGERIES    . ROBOT ASSISTED LAPAROSCOPIC NEPHRECTOMY Right 08/19/2013   Procedure: ROBOTIC ASSISTED LAPAROSCOPIC RIGHT PARTIAL NEPHRECTOMY;  Surgeon: Ardis Hughs, MD;  Location: [facility];  Service: Urology;  Laterality: Right;   Family History  Problem Relation Age of Onset  . Hypertension Mother   . Hypertension Sister   . Hypertension Brother    Social History  Substance Use Topics  . Smoking status: Former Smoker    Packs/day: 0.50    Years: 30.00    Types: Cigarettes    Quit date: 11/28/2015  . Smokeless tobacco: Never Used     Comment: stopped May 2017  . Alcohol use No     Comment: stopped 10-12 years ago    Review of Systems  Constitutional: Negative.    Respiratory: Negative.   Gastrointestinal: Negative.   Genitourinary: Negative.   Musculoskeletal: Positive for arthralgias.       As per HPI  Skin: Negative.   Neurological: Negative for dizziness, weakness, numbness and headaches.    Allergies  Ibuprofen; Penicillins; and Latex  Home Medications   Prior to Admission medications   Medication Sig Start Date End Date Taking? Authorizing Provider  amLODipine (NORVASC) 5 MG tablet Take 1 tablet (5 mg total) by mouth daily. 06/14/16  Yes Tresa Garter, MD  citalopram (CELEXA) 20 MG tablet Take 1 tablet (20 mg total) by mouth daily. 06/14/16  Yes Tresa Garter, MD  pravastatin (PRAVACHOL) 20 MG tablet Take 1 tablet (20 mg total) by mouth daily. 06/14/16  Yes Tresa Garter, MD  TRIUMEQ 600-50-300 MG tablet TAKE 1 TABLET BY MOUTH DAILY 05/24/16  Yes Michel Bickers, MD  vitamin C (ASCORBIC ACID) 500 MG tablet Take 500 mg by mouth daily.   Yes Historical Provider, MD  cyclobenzaprine (FLEXERIL) 10 MG tablet Take 1 tablet (10 mg total) by mouth 2 (two) times daily as needed for muscle spasms. 09/10/16   Domenic Moras, PA-C  traMADol (ULTRAM) 50 MG tablet Take 1 tablet (50 mg total) by mouth every 6 (six) hours as needed. 09/26/16   Janne Napoleon, NP   Meds Ordered and Administered this Visit  Medications - No data to display  BP 122/85 (BP Location: Left Arm)   Pulse 71   Temp 97.9 F (36.6 C) (Oral)   Resp 16   SpO2 100%  No data found.   Physical Exam  Constitutional: He is oriented to person, place, and time. He appears well-developed and well-nourished.  HENT:  Head: Normocephalic and atraumatic.  Eyes: EOM are normal. Left eye exhibits no discharge.  Neck: Neck supple.  Cardiovascular: Normal rate.   Pulmonary/Chest: Effort normal.  Musculoskeletal:  Right knee with tenderness primarily to the medial aspect and to the proximal patellar ligament. No appreciable swelling. Patient is able to extend to 180 with some  pain to the tibial most aspect of the knee. Flexion to 90 and limited due to pain. Tenderness to several bony points of tendon attachment to the medial aspect. No evidence of effusion. Negative drawer. Negative internal and external rotation. No laxity appreciated.  Neurological: He is alert and oriented to person, place, and time. No cranial nerve deficit.  Skin: Skin is warm and dry.  Psychiatric: He has a normal mood and affect.  Nursing note and vitals reviewed.   Urgent Care Course     Procedures (including critical care time)  Labs Review Labs Reviewed - No data to display  Imaging Review No results found.   Visual Acuity Review  Right Eye Distance:   Left Eye Distance:   Bilateral Distance:    Right Eye Near:   Left Eye Near:    Bilateral Near:         MDM   1. Acute pain of right knee   2. Tendinitis of right knee    Wear the knee sleeve for the next several days or as long she needs to for pain. Apply ice to the knee for the next few days off and on. Tramadol for pain. Limit ambulation and bending and lifting for the next few days. Follow-up with primary care doctor. Meds ordered this encounter  Medications  . traMADol (ULTRAM) 50 MG tablet    Sig: Take 1 tablet (50 mg total) by mouth every 6 (six) hours as needed.    Dispense:  15 tablet    Refill:  0    Order Specific Question:   Supervising Provider    Answer:   Ihor Gully D [id number]  knee sleeve     Janne Napoleon, NP 09/26/16 1645

## 2016-10-18 ENCOUNTER — Ambulatory Visit: Payer: BLUE CROSS/BLUE SHIELD

## 2016-10-19 ENCOUNTER — Encounter: Payer: Self-pay | Admitting: Internal Medicine

## 2016-11-04 ENCOUNTER — Encounter (HOSPITAL_COMMUNITY): Payer: Self-pay | Admitting: Emergency Medicine

## 2016-11-04 ENCOUNTER — Emergency Department (HOSPITAL_COMMUNITY)
Admission: EM | Admit: 2016-11-04 | Discharge: 2016-11-04 | Disposition: A | Discharge status: Home / Self Care | Payer: BLUE CROSS/BLUE SHIELD | Attending: Emergency Medicine | Admitting: Emergency Medicine

## 2016-11-04 DIAGNOSIS — L0201 Cutaneous abscess of face: Secondary | ICD-10-CM | POA: Diagnosis present

## 2016-11-04 DIAGNOSIS — I10 Essential (primary) hypertension: Secondary | ICD-10-CM | POA: Diagnosis not present

## 2016-11-04 DIAGNOSIS — Z91040 Latex allergy status: Secondary | ICD-10-CM | POA: Insufficient documentation

## 2016-11-04 DIAGNOSIS — L739 Follicular disorder, unspecified: Secondary | ICD-10-CM | POA: Diagnosis not present

## 2016-11-04 DIAGNOSIS — Z79899 Other long term (current) drug therapy: Secondary | ICD-10-CM | POA: Insufficient documentation

## 2016-11-04 DIAGNOSIS — Z87891 Personal history of nicotine dependence: Secondary | ICD-10-CM | POA: Diagnosis not present

## 2016-11-04 DIAGNOSIS — Z85528 Personal history of other malignant neoplasm of kidney: Secondary | ICD-10-CM | POA: Insufficient documentation

## 2016-11-04 DIAGNOSIS — L0291 Cutaneous abscess, unspecified: Secondary | ICD-10-CM

## 2016-11-04 MED ORDER — LIDOCAINE HCL (PF) 1 % IJ SOLN
5.0000 mL | Freq: Once | INTRAMUSCULAR | Status: AC
  Administered 2016-11-04: 5 mL
  Filled 2016-11-04: qty 30

## 2016-11-04 NOTE — ED Triage Notes (Signed)
Pt reports abscess/lump under left chin. Noticed it Thursday and pain with growth started Friday.

## 2016-11-04 NOTE — ED Provider Notes (Signed)
[facility] DEPT Provider Note   CSN: [id number] Arrival date & time: 11/04/16  1951   By signing my name below, I, Eunice Blase, attest that this documentation has been prepared under the direction and in the presence of Gregary Signs, PA-C. Electronically Signed: Eunice Blase, Scribe. 11/04/16. 9:43 PM.   History   Chief Complaint Chief Complaint  Patient presents with  . Abscess   The history is provided by the patient and medical records. No language interpreter was used.    HPI Comments: Drew Cuevas is a 46 y.o. male with Hx of multiple abscesses  who presents to the Emergency Department complaining of a progressively growing, raised, indurated area of skin on the bottom chin x 4 days. Pt describes 10/10 pain to the area that is improved with keeping the neck straight and worsened with turning the head to either side laterally. He had a nurse at his job stuck a needle in the affected area of skin without drainage and he has applied warm compresses without relief. Pt allergic to ibuprofen, and he states he has not taken any medications at home. Pt also states he shaves regularly. He denies difficulty breathing or swallowing, fever, chills, N/V/D, drainage, dental problems, stiffness below the tongue, sore throat, lymph node swelling, dental sores, drooling and Hx of abscesses to the current affected area.    Past Medical History:  Diagnosis Date  . Bronchitis    LAST FLARE UP WAS NEW YEARS 2015  . Cancer ([place])    kidney  . GERD (gastroesophageal reflux disease)    NO MEDS  . HIV (human immunodeficiency virus infection) ([place])    UNDER CONTROL WITH MEDICATIONS  . Hypertension   . Immune deficiency disorder ([place])   . Renal insufficiency 05/17/2015  . Renal mass, right     Patient Active Problem List   Diagnosis Date Noted  . Dyslipidemia 06/14/2016  . Rectal fistula 05/24/2016  . Renal insufficiency 05/17/2015  . Renal cell carcinoma of right kidney  ([place]) 08/19/2013  . HIV disease ([place]) 05/21/2013  . Renal cyst 05/21/2013  . Obesity (BMI 35.0-39.9 without comorbidity) 05/26/2012  . Diarrhea 11/07/2011  . Right knee pain 04/30/2011  . Recurrent boils 06/10/2009  . Internal hemorrhoids 12/01/2008  . Essential hypertension 04/28/2008  . DEPRESSION, ACUTE 12/20/2006  . Human immunodeficiency virus (HIV) disease ([place]) 08/28/2006    Past Surgical History:  Procedure Laterality Date  . NO PAST SURGERIES    . ROBOT ASSISTED LAPAROSCOPIC NEPHRECTOMY Right 08/19/2013   Procedure: ROBOTIC ASSISTED LAPAROSCOPIC RIGHT PARTIAL NEPHRECTOMY;  Surgeon: Ardis Hughs, MD;  Location: [facility];  Service: Urology;  Laterality: Right;       Home Medications    Prior to Admission medications   Medication Sig Start Date End Date Taking? Authorizing Provider  amLODipine (NORVASC) 5 MG tablet Take 1 tablet (5 mg total) by mouth daily. 06/14/16   Tresa Garter, MD  citalopram (CELEXA) 20 MG tablet Take 1 tablet (20 mg total) by mouth daily. 06/14/16   Tresa Garter, MD  cyclobenzaprine (FLEXERIL) 10 MG tablet Take 1 tablet (10 mg total) by mouth 2 (two) times daily as needed for muscle spasms. 09/10/16   Domenic Moras, PA-C  pravastatin (PRAVACHOL) 20 MG tablet Take 1 tablet (20 mg total) by mouth daily. 06/14/16   Tresa Garter, MD  traMADol (ULTRAM) 50 MG tablet Take 1 tablet (50 mg total) by mouth every 6 (six) hours as needed. 09/26/16   Janne Napoleon,  NP  TRIUMEQ 600-50-300 MG tablet TAKE 1 TABLET BY MOUTH DAILY 05/24/16   Michel Bickers, MD  vitamin C (ASCORBIC ACID) 500 MG tablet Take 500 mg by mouth daily.    Historical Provider, MD    Family History Family History  Problem Relation Age of Onset  . Hypertension Mother   . Hypertension Sister   . Hypertension Brother     Social History Social History  Substance Use Topics  . Smoking status: Former Smoker    Packs/day: 0.50    Years: 30.00    Types: Cigarettes    Quit  date: 11/28/2015  . Smokeless tobacco: Never Used     Comment: stopped May 2017  . Alcohol use No     Comment: stopped 10-12 years ago     Allergies   Ibuprofen; Penicillins; and Latex   Review of Systems Review of Systems  Constitutional: Negative for chills and fever.  HENT: Negative for dental problem, drooling, mouth sores, sore throat and trouble swallowing.   Respiratory: Negative for shortness of breath and wheezing.   Cardiovascular: Negative for chest pain and leg swelling.  Gastrointestinal: Negative for diarrhea, nausea and vomiting.  Musculoskeletal: Negative for myalgias, neck pain and neck stiffness.  Skin: Negative for color change, pallor and wound.       +raised area of skin on chin   He denies difficulty breathing or swallowing, fever, chills, N/V/D, drainage, dental problems, stiffness below the tongue, sore throat, lymph node swelling, dental sores, drooling and Hx of abscesses to the current affected area  Physical Exam Updated Vital Signs BP (!) 128/91 (BP Location: Right Arm)   Pulse 98   Temp 98.6 F (37 C) (Oral)   Resp 20   SpO2 99%   Physical Exam  Constitutional: He is oriented to person, place, and time. He appears well-developed and well-nourished. No distress.  Patient is afebrile, nontoxic-appearing, sitting comfortably in chair in no acute distress.  HENT:  Head: Normocephalic and atraumatic.  Mouth/Throat: Uvula is midline and mucous membranes are normal. No oral lesions. No dental abscesses or uvula swelling. No oropharyngeal exudate, posterior oropharyngeal edema, posterior oropharyngeal erythema or tonsillar abscesses. No tonsillar exudate.  No evidence for ludwig's; No evidence oral peritonsilar abscess; sublingual mucosa soft and non-tender. Midline uvula and intact symmetrical arches.  Eyes: Conjunctivae are normal. Pupils are equal, round, and reactive to light. Right eye exhibits no discharge. Left eye exhibits no discharge. No scleral  icterus.  Neck: Normal range of motion. No JVD present. No tracheal deviation present.  L chin, just below the mandibular: 1 cm area of induration and tenderness; no discharge, purulence or warmth  Cardiovascular: Normal rate, regular rhythm and normal heart sounds.   Pulmonary/Chest: Effort normal and breath sounds normal. No stridor. No respiratory distress. He has no wheezes.  Abdominal: Soft. There is no tenderness.  Lymphadenopathy:    He has no cervical adenopathy.  Neurological: He is alert and oriented to person, place, and time. Coordination normal.  Skin: He is not diaphoretic.  Psychiatric: He has a normal mood and affect. His behavior is normal. Judgment and thought content normal.  Nursing note and vitals reviewed.    ED Treatments / Results  DIAGNOSTIC STUDIES: Oxygen Saturation is 99% on RA, normal by my interpretation.    COORDINATION OF CARE: 9:29 PM Discussed treatment plan with pt at bedside and pt agreed to plan. Will order US of the area to R/O abscess. 9:37 PM US performed: by me Labs (  all labs ordered are listed, but only abnormal results are displayed) Labs Reviewed - No data to display  EKG  EKG Interpretation None       Radiology No results found.  Procedures Procedures (including critical care time)  EMERGENCY DEPARTMENT US SOFT TISSUE INTERPRETATION "Study: Limited Soft Tissue Ultrasound"  INDICATIONS: Soft tissue infection Multiple views of the body part were obtained in real-time with a multi-frequency linear probe  PERFORMED BY: Myself IMAGES ARCHIVED?: Yes SIDE:Left BODY PART:chin INTERPRETATION:  Abcess present and No cellulitis noted  INCISION AND DRAINAGE Performed by: Emeline General Consent: Verbal consent obtained. Risks and benefits: risks, benefits and alternatives were discussed Type: abscess  Body area: left chin  Anesthesia: local infiltration  Incision was made with a scalpel.  Local anesthetic: lidocaine 1%  w/o epinephrine  Anesthetic total: 1 ml  Complexity: complex Blunt dissection to break up loculations  Drainage: purulent  Drainage amount: 59mL  Packing material: 1/4 in iodoform gauze  Patient tolerance: Patient tolerated the procedure well with no immediate complications.      Medications Ordered in ED Medications  lidocaine (PF) (XYLOCAINE) 1 % injection 5 mL (5 mLs Infiltration Given by Other 11/04/16 2159)     Initial Impression / Assessment and Plan / ED Course  I have reviewed the triage vital signs and the nursing notes.  Pertinent labs & imaging results that were available during my care of the patient were reviewed by me and considered in my medical decision making (see chart for details).      Patient with skin abscess. Incision and drainage performed in the ED today. Packing in placet. Wound recheck in 2 days. Supportive care and return precautions discussed.  The patient appears reasonably screened and/or stabilized for discharge and I doubt any other emergent medical condition requiring further screening, evaluation, or treatment in the ED prior to discharge. Patient was well-appearing and without signs of systemic infection. No other complaints. He will be seeing his primary and the couple days for wound recheck. He is being closely followed by his physician. We'll be taking Tylenol over-the-counter at home.   Discussed strict return precautions and advised to return to the emergency department if experiencing any new or worsening symptoms. Instructions were understood and patient agreed with discharge plan.   Final Clinical Impressions(s) / ED Diagnoses   Final diagnoses:  Abscess  Folliculitis    New Prescriptions New Prescriptions   No medications on file   I personally performed the services described in this documentation, which was scribed in my presence. The recorded information has been reviewed and is accurate.    Emeline General,  PA-C 11/04/16 2303    Charlesetta Shanks, MD 11/06/16 1640

## 2016-11-04 NOTE — Discharge Instructions (Signed)
As discussed, keep the area clean and dry. Have the wound rechecked in your appointment in 48 hours. Monitor for any signs of worsening or systemic infection, including fever, chills, redness, warmth, increased purulence or any other new concerning symptoms. Return to the emergency department if she express any of these in the meantime.

## 2016-11-07 ENCOUNTER — Other Ambulatory Visit: Payer: BLUE CROSS/BLUE SHIELD

## 2016-11-07 DIAGNOSIS — B20 Human immunodeficiency virus [HIV] disease: Secondary | ICD-10-CM

## 2016-11-07 LAB — COMPREHENSIVE METABOLIC PANEL
ALBUMIN: 3.8 g/dL (ref 3.6–5.1)
ALT: 26 U/L (ref 9–46)
AST: 18 U/L (ref 10–40)
Alkaline Phosphatase: 82 U/L (ref 40–115)
BILIRUBIN TOTAL: 0.2 mg/dL (ref 0.2–1.2)
BUN: 16 mg/dL (ref 7–25)
CO2: 24 mmol/L (ref 20–31)
CREATININE: 1.67 mg/dL — AB (ref 0.60–1.35)
Calcium: 9 mg/dL (ref 8.6–10.3)
Chloride: 110 mmol/L (ref 98–110)
Glucose, Bld: 86 mg/dL (ref 65–99)
Potassium: 4 mmol/L (ref 3.5–5.3)
SODIUM: 142 mmol/L (ref 135–146)
TOTAL PROTEIN: 6.2 g/dL (ref 6.1–8.1)

## 2016-11-07 LAB — CBC
HCT: 41 % (ref 38.5–50.0)
HEMOGLOBIN: 13.8 g/dL (ref 13.2–17.1)
MCH: 32.6 pg (ref 27.0–33.0)
MCHC: 33.7 g/dL (ref 32.0–36.0)
MCV: 96.9 fL (ref 80.0–100.0)
MPV: 10.6 fL (ref 7.5–12.5)
Platelets: 187 10*3/uL (ref 140–400)
RBC: 4.23 MIL/uL (ref 4.20–5.80)
RDW: 14.4 % (ref 11.0–15.0)
WBC: 6.9 10*3/uL (ref 3.8–10.8)

## 2016-11-07 LAB — LIPID PANEL
CHOLESTEROL: 158 mg/dL (ref <200)
HDL: 30 mg/dL — ABNORMAL LOW (ref >40)
LDL Cholesterol: 74 mg/dL (ref <100)
TRIGLYCERIDES: 272 mg/dL — AB (ref <150)
Total CHOL/HDL Ratio: 5.3 Ratio — ABNORMAL HIGH (ref <5.0)
VLDL: 54 mg/dL — ABNORMAL HIGH (ref <30)

## 2016-11-08 LAB — RPR

## 2016-11-08 LAB — T-HELPER CELL (CD4) - (RCID CLINIC ONLY)
CD4 % Helper T Cell: 26 % — ABNORMAL LOW (ref 33–55)
CD4 T Cell Abs: 740 /uL (ref 400–2700)

## 2016-11-09 LAB — HIV-1 RNA QUANT-NO REFLEX-BLD
HIV 1 RNA Quant: <20 copies/mL
HIV-1 RNA QUANT, LOG: NOT DETECTED {Log_copies}/mL

## 2016-11-16 ENCOUNTER — Other Ambulatory Visit: Payer: Self-pay | Admitting: Internal Medicine

## 2016-11-16 DIAGNOSIS — B20 Human immunodeficiency virus [HIV] disease: Secondary | ICD-10-CM

## 2016-11-17 ENCOUNTER — Other Ambulatory Visit: Payer: Self-pay | Admitting: Internal Medicine

## 2016-11-17 DIAGNOSIS — B20 Human immunodeficiency virus [HIV] disease: Secondary | ICD-10-CM

## 2016-11-21 ENCOUNTER — Ambulatory Visit: Payer: Self-pay | Admitting: Internal Medicine

## 2016-12-05 ENCOUNTER — Encounter: Payer: Self-pay | Admitting: Internal Medicine

## 2016-12-25 ENCOUNTER — Ambulatory Visit: Payer: BLUE CROSS/BLUE SHIELD | Admitting: Internal Medicine

## 2017-01-28 ENCOUNTER — Other Ambulatory Visit: Payer: BLUE CROSS/BLUE SHIELD

## 2017-02-11 ENCOUNTER — Ambulatory Visit: Payer: BLUE CROSS/BLUE SHIELD | Admitting: Internal Medicine

## 2017-03-18 ENCOUNTER — Other Ambulatory Visit: Payer: BLUE CROSS/BLUE SHIELD

## 2017-03-18 ENCOUNTER — Ambulatory Visit: Payer: BLUE CROSS/BLUE SHIELD

## 2017-03-18 DIAGNOSIS — B20 Human immunodeficiency virus [HIV] disease: Secondary | ICD-10-CM

## 2017-03-19 LAB — T-HELPER CELL (CD4) - (RCID CLINIC ONLY)
CD4 T CELL HELPER: 26 % — AB (ref 33–55)
CD4 T Cell Abs: 750 /uL (ref 400–2700)

## 2017-03-20 LAB — HIV-1 RNA QUANT-NO REFLEX-BLD
HIV 1 RNA Quant: 37 copies/mL — ABNORMAL HIGH
HIV-1 RNA Quant, Log: 1.57 Log copies/mL — ABNORMAL HIGH

## 2017-03-21 ENCOUNTER — Encounter: Payer: Self-pay | Admitting: Internal Medicine

## 2017-03-21 ENCOUNTER — Encounter (HOSPITAL_COMMUNITY): Payer: Self-pay

## 2017-03-21 ENCOUNTER — Emergency Department (HOSPITAL_COMMUNITY): Payer: BLUE CROSS/BLUE SHIELD

## 2017-03-21 ENCOUNTER — Emergency Department (HOSPITAL_COMMUNITY)
Admission: EM | Admit: 2017-03-21 | Discharge: 2017-03-21 | Disposition: A | Discharge status: Home / Self Care | Payer: BLUE CROSS/BLUE SHIELD | Attending: Emergency Medicine | Admitting: Emergency Medicine

## 2017-03-21 ENCOUNTER — Emergency Department (HOSPITAL_COMMUNITY)
Admission: EM | Admit: 2017-03-21 | Discharge: 2017-03-21 | Disposition: A | Discharge status: Home / Self Care | Payer: BLUE CROSS/BLUE SHIELD | Source: Home / Self Care | Attending: Emergency Medicine | Admitting: Emergency Medicine

## 2017-03-21 DIAGNOSIS — B20 Human immunodeficiency virus [HIV] disease: Secondary | ICD-10-CM

## 2017-03-21 DIAGNOSIS — M25561 Pain in right knee: Secondary | ICD-10-CM | POA: Insufficient documentation

## 2017-03-21 DIAGNOSIS — I129 Hypertensive chronic kidney disease with stage 1 through stage 4 chronic kidney disease, or unspecified chronic kidney disease: Secondary | ICD-10-CM | POA: Diagnosis not present

## 2017-03-21 DIAGNOSIS — C641 Malignant neoplasm of right kidney, except renal pelvis: Secondary | ICD-10-CM | POA: Insufficient documentation

## 2017-03-21 DIAGNOSIS — F17210 Nicotine dependence, cigarettes, uncomplicated: Secondary | ICD-10-CM

## 2017-03-21 DIAGNOSIS — N189 Chronic kidney disease, unspecified: Secondary | ICD-10-CM | POA: Insufficient documentation

## 2017-03-21 DIAGNOSIS — K649 Unspecified hemorrhoids: Secondary | ICD-10-CM | POA: Insufficient documentation

## 2017-03-21 DIAGNOSIS — Z85528 Personal history of other malignant neoplasm of kidney: Secondary | ICD-10-CM | POA: Diagnosis not present

## 2017-03-21 DIAGNOSIS — L290 Pruritus ani: Secondary | ICD-10-CM | POA: Insufficient documentation

## 2017-03-21 MED ORDER — HYDROCORTISONE 2.5 % RE CREA: TOPICAL_CREAM | RECTAL | 0 refills | Status: DC

## 2017-03-21 NOTE — ED Notes (Signed)
ED Provider at bedside. 

## 2017-03-21 NOTE — ED Notes (Signed)
Patient called x2 no answer. 

## 2017-03-21 NOTE — ED Notes (Signed)
Bed: [facility] Expected date:  Expected time:  Means of arrival:  Comments: 

## 2017-03-21 NOTE — Discharge Instructions (Signed)
You were seen in the ED today with rectal itching and right knee pain. The x-ray was normal of the knee. You may need your PCP to refer you to an Orthopedic physician if pain continues. We also found evidence of hemorrhoids. We are treating this with a cream and will ask to to begin taking over the counter stool softeners to reduce any constipation.   Return to the ED with any new or worsening symptoms.

## 2017-03-21 NOTE — Progress Notes (Signed)
CM noted pt has been coming to the ED for PCP needs instead of going to see his PCP at the [facility] and [facility].  Pt states he has been trying to call but has been unable to get an appointment and requested assistance in getting one.  CM noted pt has an upcoming appointment for pt's annual on 9-5.  Advised pt of appointment.  No further CM needs noted at this time.

## 2017-03-21 NOTE — ED Triage Notes (Signed)
Pt complains of right knee pain and a popping sensation that started around a month ago. Popping sensation occurs when walking, pt states has almost fallen a couple times when this occurs. Pt also states he has anal itching that is unbearable, and can not complete daily functions from lack of sleep and irritation due to the anal itching.

## 2017-03-21 NOTE — ED Provider Notes (Signed)
Emergency Department Provider Note   I have reviewed the triage vital signs and the nursing notes.   HISTORY  Chief Complaint Knee Pain and Anal Itching   HPI Drew Cuevas is a 46 y.o. male with PMH of GERD, HIV, Bronchitis presents to the emergency department for evaluation of right knee pain and rectal itching. The patient states that his right knee pain started approximately a month ago. There was no inciting event or injury. States is worse with walking. He will feel a "popping" when walking and have pain. No numbness or tingling. He has not been evaluated for this since it started. He is trying Tylenol and other remedies with no relief. No history of knee surgery. No pain behind the calf or leg swelling. He denies any notable deformity, joint redness, or swelling.  The patient's anal itching has been ongoing for "a while" meaning months. He notes occasionally seeing spots of blood but nothing significant recently. He has a burning pain at times. Patient is sexually active with an but has been the same partner for the past 10 years. He states the last time he had anal receptive intercourse was 2 years ago. Denies any concern for sexual transmitted infection.    Past Medical History:  Diagnosis Date  . Bronchitis    LAST FLARE UP WAS NEW YEARS 2015  . Cancer ([place])    kidney  . GERD (gastroesophageal reflux disease)    NO MEDS  . HIV (human immunodeficiency virus infection) ([place])    UNDER CONTROL WITH MEDICATIONS  . Hypertension   . Immune deficiency disorder ([place])   . Renal insufficiency 05/17/2015  . Renal mass, right     Patient Active Problem List   Diagnosis Date Noted  . Dyslipidemia 06/14/2016  . Rectal fistula 05/24/2016  . Renal insufficiency 05/17/2015  . Renal cell carcinoma of right kidney ([place]) 08/19/2013  . HIV disease ([place]) 05/21/2013  . Renal cyst 05/21/2013  . Obesity (BMI 35.0-39.9 without comorbidity) 05/26/2012  . Diarrhea 11/07/2011  . Right knee  pain 04/30/2011  . Recurrent boils 06/10/2009  . Internal hemorrhoids 12/01/2008  . Essential hypertension 04/28/2008  . DEPRESSION, ACUTE 12/20/2006  . Human immunodeficiency virus (HIV) disease ([place]) 08/28/2006    Past Surgical History:  Procedure Laterality Date  . NO PAST SURGERIES    . ROBOT ASSISTED LAPAROSCOPIC NEPHRECTOMY Right 08/19/2013   Procedure: ROBOTIC ASSISTED LAPAROSCOPIC RIGHT PARTIAL NEPHRECTOMY;  Surgeon: Ardis Hughs, MD;  Location: [facility];  Service: Urology;  Laterality: Right;    Current Outpatient Rx  . Order #: [id number] Class: Normal  . Order #: [id number] Class: Normal  . Order #: [id number] Class: Historical Med  . Order #: [id number] Class: Historical Med  . Order #: [id number] Class: Normal  . Order #: [id number] Class: Normal  . Order #: [id number] Class: Historical Med  . Order #: [id number] Class: Print    Allergies Ibuprofen; Penicillins; and Latex  Family History  Problem Relation Age of Onset  . Hypertension Mother   . Hypertension Sister   . Hypertension Brother     Social History Social History  Substance Use Topics  . Smoking status: Current Every Day Smoker    Packs/day: 0.50    Years: 30.00    Types: Cigarettes    Last attempt to quit: 11/28/2015  . Smokeless tobacco: Never Used     Comment: stopped May 2017  . Alcohol use No     Comment: stopped 10-12 years ago    Review of Systems  Constitutional: No  fever/chills Eyes: No visual changes. ENT: No sore throat. Cardiovascular: Denies chest pain. Respiratory: Denies shortness of breath. Gastrointestinal: No abdominal pain.  No nausea, no vomiting.  No diarrhea.  No constipation. Positive rectal burning and itching.  Genitourinary: Negative for dysuria. Musculoskeletal: Negative for back pain. Positive right knee pain.  Skin: Negative for rash. Neurological: Negative for headaches, focal weakness or numbness.  10-point ROS otherwise  negative.  ____________________________________________   PHYSICAL EXAM:  VITAL SIGNS: ED Triage Vitals  Enc Vitals Group     BP 03/21/17 0953 (!) 151/87     Pulse Rate 03/21/17 0953 77     Resp 03/21/17 0953 16     Temp 03/21/17 0953 97.8 F (36.6 C)     Temp Source 03/21/17 0953 Oral     SpO2 03/21/17 0953 100 %     Weight 03/21/17 0953 215 lb (97.5 kg)     Height 03/21/17 0953 5\' 5"  (1.651 m)     Pain Score 03/21/17 0959 8   Constitutional: Alert and oriented. Well appearing and in no acute distress. Eyes: Conjunctivae are normal.  Head: Atraumatic. Nose: No congestion/rhinnorhea. Mouth/Throat: Mucous membranes are moist. Neck: No stridor. Cardiovascular: Normal rate, regular rhythm. Good peripheral circulation. Grossly normal heart sounds.   Respiratory: Normal respiratory effort.  No retractions. Lungs CTAB. Gastrointestinal: Soft and nontender. No distention. Small, non-thrombosed hemorrhoid. No active bleeding or discharge. No perirectal abscess. No fissure. No obvious fistula.  Musculoskeletal: No lower extremity tenderness nor edema. No gross deformities of extremities. Neurologic:  Normal speech and language. No gross focal neurologic deficits are appreciated.  Skin:  Skin is warm, dry and intact. No rash noted.  ____________________________________________  RADIOLOGY  Dg Knee 2 Views Right  Result Date: 03/21/2017 CLINICAL DATA:  Right knee pain and popping for 1 month. No known injury. EXAM: RIGHT KNEE - 1-2 VIEW COMPARISON:  None. FINDINGS: No evidence of fracture, dislocation, or joint effusion. No evidence of arthropathy or other focal bone abnormality. Soft tissues are unremarkable. IMPRESSION: Normal exam. Electronically Signed   By: Inge Rise M.D.   On: 03/21/2017 12:41    ____________________________________________   PROCEDURES  Procedure(s) performed:   Procedures  None ____________________________________________   INITIAL  IMPRESSION / ASSESSMENT AND PLAN / ED COURSE  Pertinent labs & imaging results that were available during my care of the patient were reviewed by me and considered in my medical decision making (see chart for details).  Patient presents to the emergency room for evaluation of right knee pain and perirectal itching and burning. No joint laxity on exam of the right knee. No joint swelling, redness, warmth. No evidence of septic arthritis. There is no lower extremity swelling. The reason to suspect DVT. Plan or plain films and reevaluation. In terms of the patient's rectal itching and discomfort he does have 2 small external hemorrhoids. No evidence of perirectal abscess. No fissures or fistulas. Patient does have anal receptive sex but has not been active in that way for the past 2 years.   12:55 PM Patient x-ray is negative. Plan to treat hemorrhoids symptomatically. Plan for follow-up with primary care physician.   At this time, I do not feel there is any life-threatening condition present. I have reviewed and discussed all results (EKG, imaging, lab, urine as appropriate), exam findings with patient. I have reviewed nursing notes and appropriate previous records.  I feel the patient is safe to be discharged home without further emergent workup. Discussed usual and customary return  precautions. Patient and family (if present) verbalize understanding and are comfortable with this plan.  Patient will follow-up with their primary care provider. If they do not have a primary care provider, information for follow-up has been provided to them. All questions have been answered.  ____________________________________________  FINAL CLINICAL IMPRESSION(S) / ED DIAGNOSES  Final diagnoses:  Acute pain of right knee  Hemorrhoids, unspecified hemorrhoid type     MEDICATIONS GIVEN DURING THIS VISIT:  Medications - No data to display   NEW OUTPATIENT MEDICATIONS STARTED DURING THIS VISIT:  Discharge  Medication List as of 03/21/2017 12:59 PM    START taking these medications   Details  hydrocortisone (ANUSOL-HC) 2.5 % rectal cream Apply rectally 2 times daily for 7 days., Print        Note:  This document was prepared using Dragon voice recognition software and may include unintentional dictation errors.  Nanda Quinton, MD Emergency Medicine    Long, Wonda Olds, MD 03/21/17 [phone]

## 2017-03-21 NOTE — ED Notes (Signed)
Pt left prior to being triaged.

## 2017-04-02 ENCOUNTER — Ambulatory Visit: Payer: BLUE CROSS/BLUE SHIELD | Admitting: Internal Medicine

## 2017-04-03 ENCOUNTER — Ambulatory Visit: Payer: BLUE CROSS/BLUE SHIELD | Attending: Internal Medicine | Admitting: Internal Medicine

## 2017-04-03 ENCOUNTER — Encounter: Payer: Self-pay | Admitting: Internal Medicine

## 2017-04-03 VITALS — BP 134/86 | HR 87 | Temp 98.2°F | Resp 16 | Wt 224.6 lb

## 2017-04-03 DIAGNOSIS — Z0000 Encounter for general adult medical examination without abnormal findings: Secondary | ICD-10-CM | POA: Diagnosis not present

## 2017-04-03 DIAGNOSIS — D849 Immunodeficiency, unspecified: Secondary | ICD-10-CM | POA: Insufficient documentation

## 2017-04-03 DIAGNOSIS — E785 Hyperlipidemia, unspecified: Secondary | ICD-10-CM

## 2017-04-03 DIAGNOSIS — K219 Gastro-esophageal reflux disease without esophagitis: Secondary | ICD-10-CM | POA: Diagnosis not present

## 2017-04-03 DIAGNOSIS — M1711 Unilateral primary osteoarthritis, right knee: Secondary | ICD-10-CM | POA: Insufficient documentation

## 2017-04-03 DIAGNOSIS — Z131 Encounter for screening for diabetes mellitus: Secondary | ICD-10-CM

## 2017-04-03 DIAGNOSIS — L299 Pruritus, unspecified: Secondary | ICD-10-CM | POA: Insufficient documentation

## 2017-04-03 DIAGNOSIS — Z23 Encounter for immunization: Secondary | ICD-10-CM | POA: Diagnosis not present

## 2017-04-03 DIAGNOSIS — I10 Essential (primary) hypertension: Secondary | ICD-10-CM | POA: Diagnosis not present

## 2017-04-03 DIAGNOSIS — L290 Pruritus ani: Secondary | ICD-10-CM | POA: Insufficient documentation

## 2017-04-03 DIAGNOSIS — Z79899 Other long term (current) drug therapy: Secondary | ICD-10-CM | POA: Insufficient documentation

## 2017-04-03 DIAGNOSIS — B20 Human immunodeficiency virus [HIV] disease: Secondary | ICD-10-CM | POA: Insufficient documentation

## 2017-04-03 LAB — POCT GLYCOSYLATED HEMOGLOBIN (HGB A1C): Hemoglobin A1C: 7.8

## 2017-04-03 MED ORDER — MEBENDAZOLE 100 MG PO CHEW: 200.0000 mg | CHEWABLE_TABLET | Freq: Once | ORAL | 0 refills | Status: AC

## 2017-04-03 MED ORDER — LIDOCAINE-HYDROCORTISONE ACE 3-0.5 % RE CREA: 1.0000 | TOPICAL_CREAM | Freq: Two times a day (BID) | RECTAL | 1 refills | Status: DC

## 2017-04-03 NOTE — Patient Instructions (Signed)
Anal Pruritus Anal pruritus is when the area around the opening of the butt (anus) feels itchy. This itchy feeling often happens when the area is moist. Moisture may come from sweat or from a small amount of poop (stool) that is left on the area. Scratching can cause more skin damage. Follow these instructions at home: Pay attention to any changes in your condition. Take these actions to help with your itching: Skin Care  Keep clean by washing your body well. ? Clean the affected area with wet toilet paper, baby wipes, or a wet washcloth. Do this after you poop and at bedtime. ? Avoid using soaps on this area. ? Dry the area well. Pat the area dry.  Do not scrub the area with anything, even toilet paper.  Do not scratch the itchy area. Scratching makes the itching worse.  Take a warm water bath (sitz bath) as told by your doctor. Pat the area dry with a soft cloth.  Use creams or ointments as told by your doctor.  Do not use bubble bath, scented toilet paper, or genital deodorants. General instructions  Take over-the-counter and prescription medicines only as told by your doctor.  Talk with your doctor about fiber pills (supplements).  Wear cotton underwear and loose clothing.  Keep all follow-up visits as told by your doctor. This is important. Contact a doctor if:  Your itching does not get better after many days.  Your itching gets worse.  You have a fever.  You have redness, swelling, or pain in the itchy area.  You have fluid, blood, or pus coming from the itchy area. This information is not intended to replace advice given to you by your health care provider. Make sure you discuss any questions you have with your health care provider. Document Released: 03/28/2011 Document Revised: 12/22/2015 Document Reviewed: 10/11/2014 Elsevier Interactive Patient Education  2018 Elsevier Inc.  

## 2017-04-03 NOTE — Progress Notes (Signed)
Drew Cuevas, is a 46 y.o. male  IWL:[id number]  RDE:[id number]  DOB - 02-Mar-1971  Chief Complaint  Patient presents with  . Annual Exam      Subjective:   Drew Cuevas is a 46 y.o. male with history of hypertension, hyperlipidemia and HIV disease well controlled on current medications presents here today for a routine follow up visit and health maintenance. Patient is complaining of itching around the anus, on and off for years but this episode started few weeks ago, with so much discomfort. He has history of fistulectomy at [facility], post surgery patient had occasional perirectal pain and some leakages which was again corrected. Since then he has been okay, no more leakages. He claims adherence to his medications from [facility]. He also complains of pain to his right knee from his arthritis. He feels clicking sounds whenever he flexes or extends his right knee joint, associated with pain. No swelling or redness around the joint. No history of trauma or fall. He denies any urinary symptoms. He denies any change in bowel habit, no blood in stool. Patient has No headache, No chest pain, No abdominal pain - No Nausea, No new weakness tingling or numbness, No Cough - SOB. He denies any suicidal ideation or thoughts. He is not currently sexually active, used to be in anal receptive intercourse.  Problem  Anal Itching  Primary Osteoarthritis of Right Knee    ALLERGIES:  PAST MEDICAL HISTORY: Past Medical History:  Diagnosis Date  . Bronchitis    LAST FLARE UP WAS NEW YEARS 2015  . Cancer ([place])    kidney  . GERD (gastroesophageal reflux disease)    NO MEDS  . HIV (human immunodeficiency virus infection) ([place])    UNDER CONTROL WITH MEDICATIONS  . Hypertension   . Immune deficiency disorder ([place])   . Renal insufficiency 05/17/2015  . Renal mass, right     MEDICATIONS AT HOME: Prior to Admission medications   Medication Sig Start Date End Date  Taking? Authorizing Provider  amLODipine (NORVASC) 5 MG tablet Take 1 tablet (5 mg total) by mouth daily. Patient taking differently: Take 5 mg by mouth at bedtime.  06/14/16   Tresa Garter, MD  citalopram (CELEXA) 20 MG tablet Take 1 tablet (20 mg total) by mouth daily. Patient taking differently: Take 20 mg by mouth at bedtime.  06/14/16   Tresa Garter, MD  diphenhydrAMINE (BENADRYL) 25 mg capsule Take 25 mg by mouth every 6 (six) hours as needed for itching or allergies.    [provider]  hydrocortisone (ANUSOL-HC) 2.5 % rectal cream Apply rectally 2 times daily for 7 days. 03/21/17   Long, Wonda Olds, MD  hydrocortisone cream 0.5 % Apply 1 application topically daily as needed for itching.    [provider]  lidocaine-hydrocortisone (ANAMANTEL HC) 3-0.5 % CREA Place 1 Applicatorful rectally 2 (two) times daily. 04/03/17   Tresa Garter, MD  mebendazole (VERMOX) 100 MG chewable tablet Chew 2 tablets (200 mg total) by mouth once. 04/03/17 04/03/17  Tresa Garter, MD  pravastatin (PRAVACHOL) 20 MG tablet Take 1 tablet (20 mg total) by mouth daily. Patient taking differently: Take 20 mg by mouth at bedtime.  06/14/16   Tresa Garter, MD  [place] 600-50-300 MG tablet TAKE 1 TABLET BY MOUTH DAILY Patient taking differently: Take 1 tablet by mouth at bedtime.  11/16/16   Michel Bickers, MD  vitamin C (ASCORBIC ACID) 500 MG tablet Take  500 mg by mouth at bedtime.     [provider]    Objective:   Vitals:   04/03/17 1602  BP: 134/86  Pulse: 87  Resp: 16  Temp: 98.2 F (36.8 C)  TempSrc: Oral  SpO2: 97%  Weight: 224 lb 9.6 oz (101.9 kg)   Exam General appearance : Awake, alert, not in any distress. Speech Clear. Not toxic looking HEENT: Atraumatic and Normocephalic, pupils equally reactive to light and accomodation Neck: Supple, no JVD. No cervical lymphadenopathy.  Chest: Good air entry bilaterally, no added sounds  CVS: S1 S2  regular, no murmurs.  Abdomen: Bowel sounds present, Non tender and not distended with no gaurding, rigidity or rebound. Extremities: B/L Lower Ext shows no edema, both legs are warm to touch Neurology: Awake alert, and oriented X 3, CN II-XII intact, Non focal Skin: No Rash Rectal Exam: No significant findings, normal external anal sphincteric tone.  Data Review No results found for: HGBA1C  Assessment & Plan   1. Anal itching  - mebendazole (VERMOX) 100 MG chewable tablet; Chew 2 tablets (200 mg total) by mouth once.  Dispense: 2 tablet; Refill: 0 - lidocaine-hydrocortisone (ANAMANTEL HC) 3-0.5 % CREA; Place 1 Applicatorful rectally 2 (two) times daily.  Dispense: 2 Tube; Refill: 1  2. Essential hypertension  We have discussed target BP range and blood pressure goal. I have advised patient to check BP regularly and to call us back or report to clinic if the numbers are consistently higher than 140/90. We discussed the importance of compliance with medical therapy and DASH diet recommended, consequences of uncontrolled hypertension discussed.   3. Dyslipidemia  To address this please limit saturated fat to no more than 7% of your calories, limit cholesterol to 200 mg/day, increase fiber and exercise as tolerated. If needed we may add another cholesterol lowering medication to your regimen.   4. Primary osteoarthritis of right knee  - Ambulatory referral to Orthopedic Surgery  Patient have been counseled extensively about nutrition and exercise. Other issues discussed during this visit include: low cholesterol diet, weight control and daily exercise  Return in about 6 months (around 10/01/2017) for Routine Follow Up, Annual Physical.  The patient was given clear instructions to go to ER or return to medical center if symptoms don't improve, worsen or new problems develop. The patient verbalized understanding. The patient was told to call to get lab results if they haven't heard  anything in the next week.   This note has been created with Surveyor, quantity. Any transcriptional errors are unintentional.    Angelica Chessman, MD, [place], [place], [place], [place] and [place], [place]   04/03/2017, 4:50 PM

## 2017-04-04 ENCOUNTER — Encounter: Payer: Self-pay | Admitting: Pharmacist

## 2017-04-04 NOTE — Progress Notes (Signed)
Received prior authorization for mebendazole (Emverm). Discussed with Dr. Doreene Burke the indication and there is concern for worms. PA completed and submitted to [place].

## 2017-04-05 ENCOUNTER — Encounter: Payer: Self-pay | Admitting: Internal Medicine

## 2017-04-05 ENCOUNTER — Other Ambulatory Visit: Payer: Self-pay | Admitting: Internal Medicine

## 2017-04-05 DIAGNOSIS — E119 Type 2 diabetes mellitus without complications: Secondary | ICD-10-CM

## 2017-04-05 MED ORDER — GLIPIZIDE 5 MG PO TABS: 5.0000 mg | ORAL_TABLET | Freq: Two times a day (BID) | ORAL | 3 refills | Status: DC

## 2017-04-08 ENCOUNTER — Telehealth: Payer: Self-pay | Admitting: Internal Medicine

## 2017-04-08 NOTE — Telephone Encounter (Signed)
Pt called requesting to speak to PCP , pt states new DM medication has caused his sugar to drop very low. Pt is requesting to be switched to metformin. Pt is currently at work if can not reach on cell call work #

## 2017-04-09 NOTE — Telephone Encounter (Signed)
Called patient back. Blood sugar in the 50s - instructed patient to hold the glipizide for now. He will follow up with me next week to review blood sugars and to determine appropriate treatment.  Patient was also questioning diagnosis of diabetes as he does not have family history. He reports eating a lot of food and sweets the day he got the A1c. Discussed with patient that the A1c is an average blood sugar over 3 months so therefore one day would not greatly impact it. His A1c, and therefore blood glucose average, was elevated.  I do not know if there is a reason that he is not on metformin but would like to further evaluated before recommending use - patient does have elevated SCr but no recent GFR.  Don't expect his blood sugars to greatly elevate off glipizide -  patient instructed to call the clinic immediately if he has blood sugars 300 or higher. Otherwise, okay to follow up next week.

## 2017-04-09 NOTE — Telephone Encounter (Signed)
Patient verified DOB Patient states his sugars are bottoming out ranging in the low 50's and patient eats/drinks a "sweet" to stabilize. Patient is requesting metformin. Patient took one dose yesterday morning and has not resumed medication. Patient would like switch to metformin. Patient is aware of request being sent to clinical pharmacist while PCP is [facility].

## 2017-04-09 NOTE — Telephone Encounter (Signed)
Pt called to speak with the nurse, very important t and he is worry, please follow up

## 2017-04-10 IMAGING — CR DG CHEST 2V
2 series · 2 of 2 positions shown · non-contrast
Comparison: 10/11/2015

CLINICAL DATA: Cough and sore throat for several days, initial
encounter

EXAM:
CHEST  2 VIEW

[w chest pa]
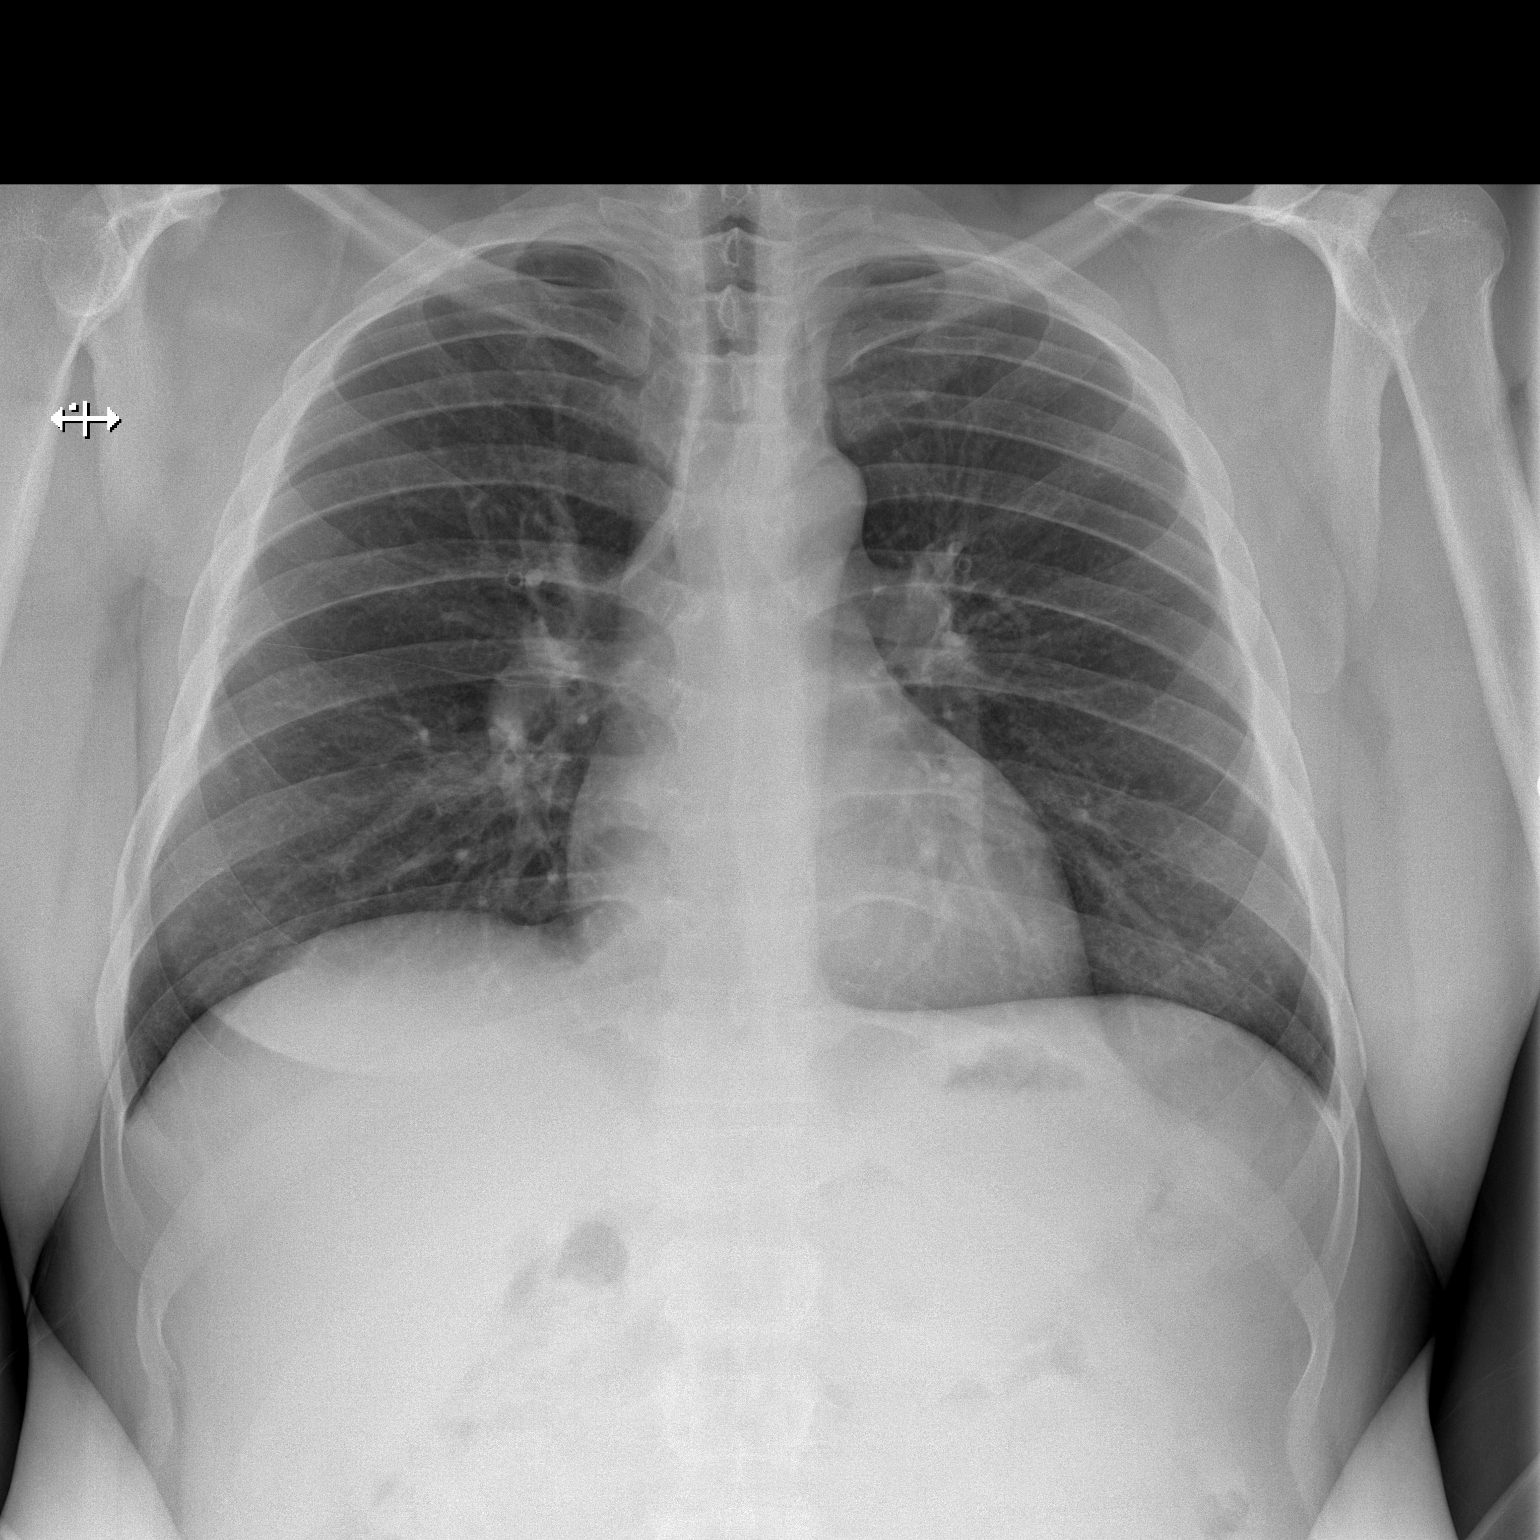

[w chest lat]
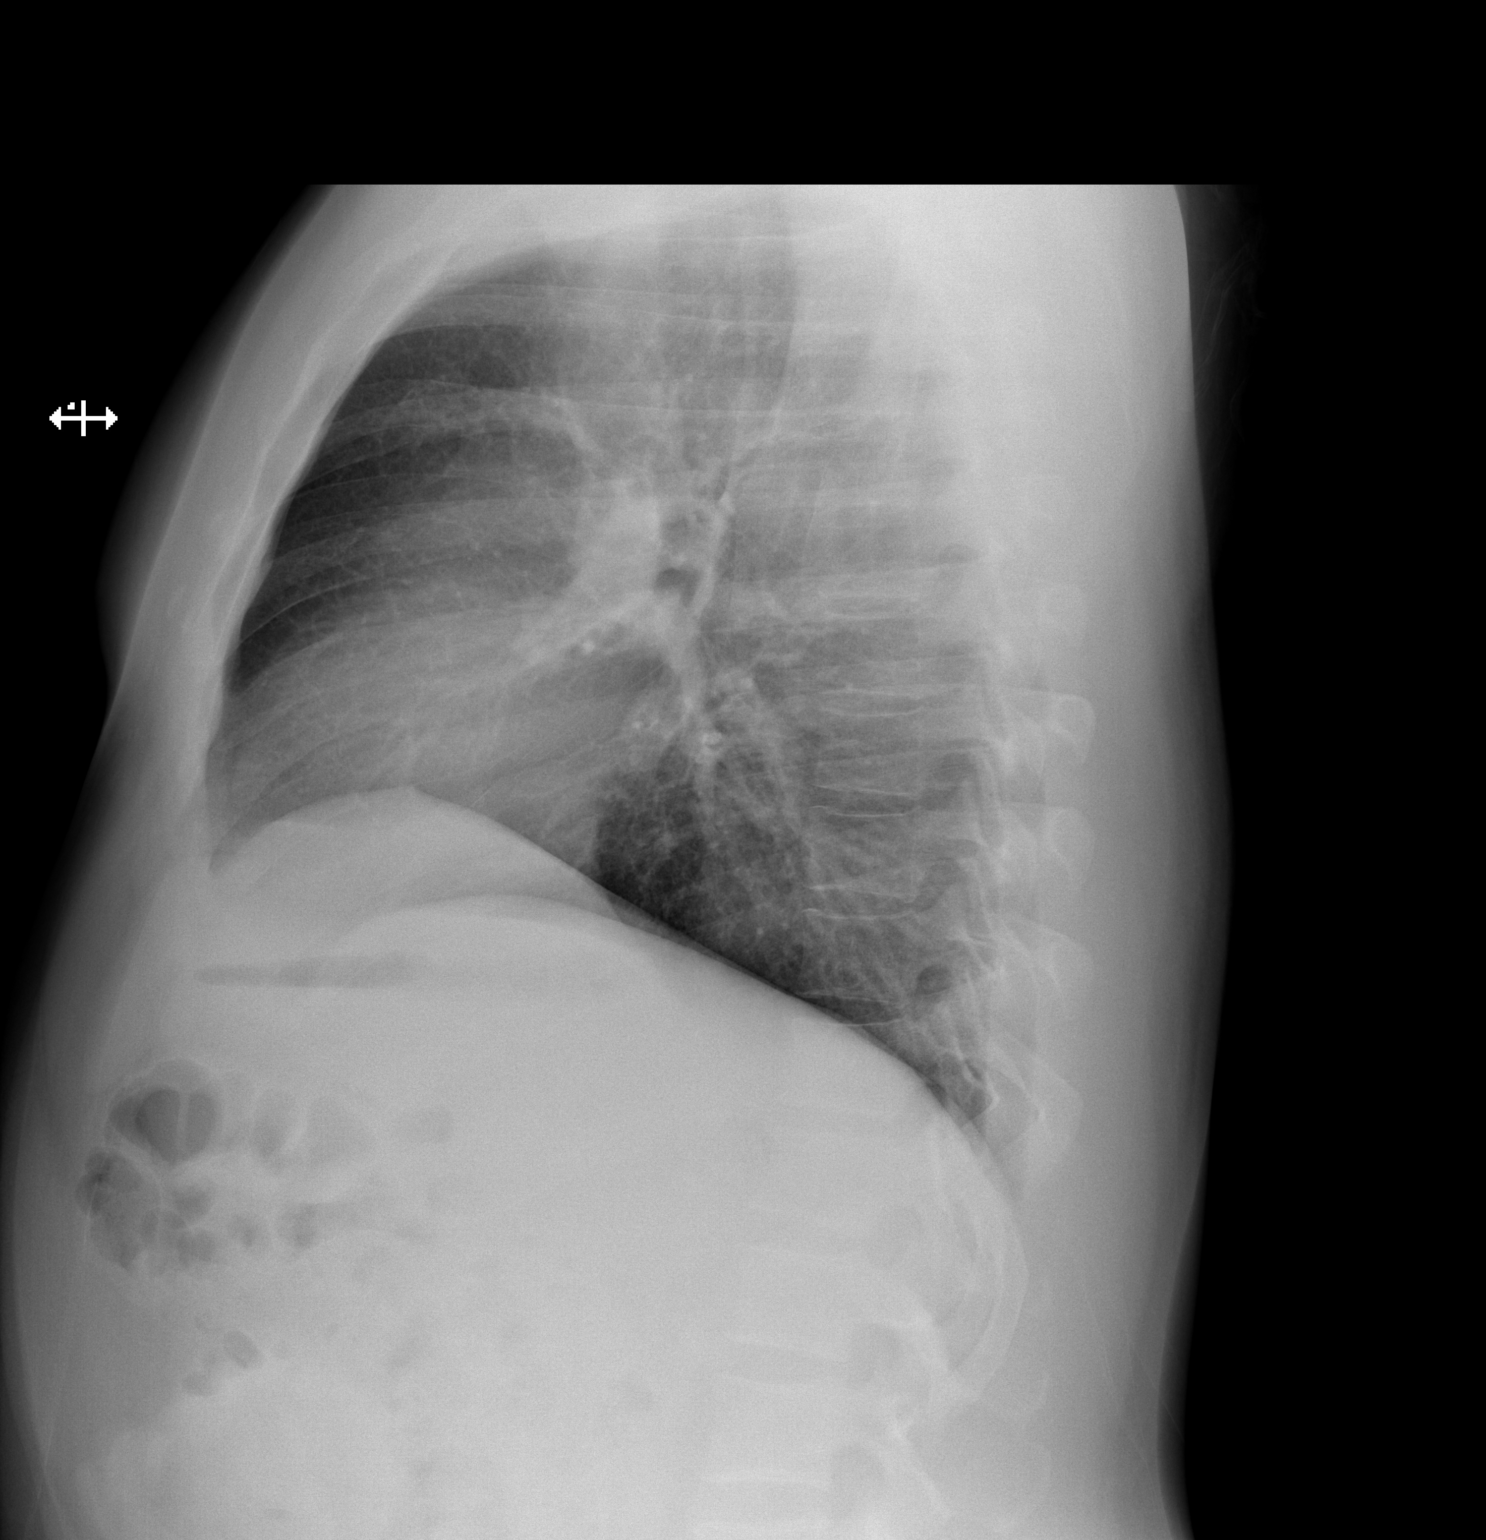

[2 of 2 positions shown; findings below may reference images not displayed]

FINDINGS: The heart size and mediastinal contours are within normal limits.
Both lungs are clear. The visualized skeletal structures are
unremarkable.
IMPRESSION: No active cardiopulmonary disease.

## 2017-04-16 NOTE — Progress Notes (Signed)
Prior auth approved for [facility] 04/04/2017 - 07/29/2038

## 2017-04-29 ENCOUNTER — Ambulatory Visit (INDEPENDENT_AMBULATORY_CARE_PROVIDER_SITE_OTHER): Payer: BLUE CROSS/BLUE SHIELD | Admitting: Internal Medicine

## 2017-04-29 ENCOUNTER — Encounter: Payer: Self-pay | Admitting: Internal Medicine

## 2017-04-29 VITALS — BP 128/82 | HR 88 | Temp 98.4°F | Wt 221.0 lb

## 2017-04-29 DIAGNOSIS — Z23 Encounter for immunization: Secondary | ICD-10-CM | POA: Diagnosis not present

## 2017-04-29 DIAGNOSIS — B20 Human immunodeficiency virus [HIV] disease: Secondary | ICD-10-CM | POA: Diagnosis not present

## 2017-04-29 NOTE — Assessment & Plan Note (Signed)
His infection remains under good long-term control. He will continue Triumeq and follow-up after lab work in 6 months

## 2017-04-29 NOTE — Progress Notes (Signed)
Patient Active Problem List   Diagnosis Date Noted  . Human immunodeficiency virus (HIV) disease ([place]) 08/28/2006    Priority: High  . Renal cell carcinoma of right kidney ([place]) 08/19/2013    Priority: Medium  . Essential hypertension 04/28/2008    Priority: Medium  . Anal itching 04/03/2017  . Primary osteoarthritis of right knee 04/03/2017  . Dyslipidemia 06/14/2016  . Renal cyst 05/21/2013  . Obesity (BMI 35.0-39.9 without comorbidity) 05/26/2012  . Right knee pain 04/30/2011  . Internal hemorrhoids 12/01/2008  . DEPRESSION, ACUTE 12/20/2006    Patient's Medications  New Prescriptions   No medications on file  Previous Medications   AMLODIPINE (NORVASC) 5 MG TABLET    Take 1 tablet (5 mg total) by mouth daily.   CITALOPRAM (CELEXA) 20 MG TABLET    Take 1 tablet (20 mg total) by mouth daily.   DIPHENHYDRAMINE (BENADRYL) 25 MG CAPSULE    Take 25 mg by mouth every 6 (six) hours as needed for itching or allergies.   HYDROCORTISONE (ANUSOL-HC) 2.5 % RECTAL CREAM    Apply rectally 2 times daily for 7 days.   HYDROCORTISONE CREAM 0.5 %    Apply 1 application topically daily as needed for itching.   LIDOCAINE-HYDROCORTISONE (ANAMANTEL HC) 3-0.5 % CREA    Place 1 Applicatorful rectally 2 (two) times daily.   PRAVASTATIN (PRAVACHOL) 20 MG TABLET    Take 1 tablet (20 mg total) by mouth daily.   TRIUMEQ 600-50-300 MG TABLET    TAKE 1 TABLET BY MOUTH DAILY   VITAMIN C (ASCORBIC ACID) 500 MG TABLET    Take 500 mg by mouth at bedtime.   Modified Medications   No medications on file  Discontinued Medications   No medications on file    Subjective: Jamesen is in for his routine HIV follow-up visit. He's had no problems obtaining, taking or tolerating his Triumeq. He has not missed any doses. He recently saw his primary care provider and was told that he was diabetic after his A1c returned elevated at 7.8. He says that when he checks his blood s at work they are always normal.He  was started on glipizide but his blood sugars fell into the 40s so he stopped it. He says that he spoke with the pharmacist at his primary care provider's officebut has not heard back ab what to do now.   Review of Systems: Review of Systems  Constitutional: Negative for chills, diaphoresis, fever, malaise/fatigue and weight loss.  HENT: Negative for sore throat.   Respiratory: Negative for cough, sputum production and shortness of breath.   Cardiovascular: Negative for chest pain.  Gastrointestinal: Negative for abdominal pain, diarrhea, heartburn, nausea and vomiting.  Genitourinary: Negative for dysuria and frequency.  Musculoskeletal: Negative for joint pain and myalgias.  Skin: Negative for rash.  Neurological: Negative for dizziness and headaches.  Psychiatric/Behavioral: Negative for depression and substance abuse. The patient is not nervous/anxious.     Past Medical History:  Diagnosis Date  . Bronchitis    LAST FLARE UP WAS NEW YEARS 2015  . Cancer ([place])    kidney  . GERD (gastroesophageal reflux disease)    NO MEDS  . HIV (human immunodeficiency virus infection) ([place])    UNDER CONTROL WITH MEDICATIONS  . Hypertension   . Immune deficiency disorder ([place])   . Renal insufficiency 05/17/2015  . Renal mass, right     Social History  Substance Use Topics  .  Smoking status: Current Every Day Smoker    Packs/day: 0.50    Years: 30.00    Types: Cigarettes    Last attempt to quit: 11/28/2015  . Smokeless tobacco: Never Used     Comment: stopped May 2017  . Alcohol use No     Comment: stopped 10-12 years ago    Family History  Problem Relation Age of Onset  . Hypertension Mother   . Hypertension Sister   . Hypertension Brother      Objective:  Vitals:   04/29/17 1547  BP: 128/82  Pulse: 88  Temp: 98.4 F (36.9 C)  TempSrc: Oral  Weight: 221 lb (100.2 kg)   Body mass index is 36.78 kg/m.  Physical Exam  Constitutional: He is oriented to person, place,  and time.  HENT:  Mouth/Throat: No oropharyngeal exudate.  Eyes: Conjunctivae are normal.  Cardiovascular: Normal rate and regular rhythm.   No murmur heard. Pulmonary/Chest: Effort normal and breath sounds normal.  Abdominal: Soft. He exhibits no mass. There is no tenderness.  Musculoskeletal: Normal range of motion.  Neurological: He is alert and oriented to person, place, and time.  Skin: No rash noted.  Psychiatric: Mood and affect normal.    Lab Results Lab Results  Component Value Date   WBC 6.9 11/07/2016   HGB 13.8 11/07/2016   HCT 41.0 11/07/2016   MCV 96.9 11/07/2016   PLT 187 11/07/2016    Lab Results  Component Value Date   CREATININE 1.67 (H) 11/07/2016   BUN 16 11/07/2016   NA 142 11/07/2016   K 4.0 11/07/2016   CL 110 11/07/2016   CO2 24 11/07/2016    Lab Results  Component Value Date   ALT 26 11/07/2016   AST 18 11/07/2016   ALKPHOS 82 11/07/2016   BILITOT 0.2 11/07/2016    Lab Results  Component Value Date   CHOL 158 11/07/2016   HDL 30 (L) 11/07/2016   LDLCALC 74 11/07/2016   TRIG 272 (H) 11/07/2016   CHOLHDL 5.3 (H) 11/07/2016   Lab Results  Component Value Date   LABRPR NON REAC 11/07/2016   HIV 1 RNA Quant (copies/mL)  Date Value  03/18/2017 37 (H)  11/07/2016 <20 NOT DETECTED  05/10/2016 94 (H)   CD4 T Cell Abs (/uL)  Date Value  03/18/2017 750  11/07/2016 740  05/10/2016 840     Problem List Items Addressed This Visit      High   Human immunodeficiency virus (HIV) disease ([place])    His infection remains under good long-term control. He will continue Triumeq and follow-up after lab work in 6 months      Relevant Orders   T-helper cell (CD4)- ([facility] clinic only)   HIV 1 RNA quant-no reflex-bld   CBC   Comprehensive metabolic panel   Lipid panel   RPR        Michel Bickers, MD [facility] for [place] [phone] pager   [phone] cell 04/29/2017, 4:05 PM

## 2017-04-29 NOTE — Addendum Note (Signed)
Addended by: Aundria Rud on: 04/29/2017 04:39 PM   Modules accepted: Orders

## 2017-05-11 ENCOUNTER — Other Ambulatory Visit: Payer: Self-pay | Admitting: Internal Medicine

## 2017-05-11 DIAGNOSIS — F331 Major depressive disorder, recurrent, moderate: Secondary | ICD-10-CM

## 2017-05-11 DIAGNOSIS — E785 Hyperlipidemia, unspecified: Secondary | ICD-10-CM

## 2017-05-11 DIAGNOSIS — I10 Essential (primary) hypertension: Secondary | ICD-10-CM

## 2017-07-22 ENCOUNTER — Other Ambulatory Visit: Payer: Self-pay

## 2017-07-22 ENCOUNTER — Encounter (HOSPITAL_COMMUNITY): Payer: Self-pay | Admitting: Emergency Medicine

## 2017-07-22 ENCOUNTER — Emergency Department (HOSPITAL_COMMUNITY)
Admission: EM | Admit: 2017-07-22 | Discharge: 2017-07-23 | Disposition: A | Discharge status: Home / Self Care | Payer: BLUE CROSS/BLUE SHIELD | Attending: Emergency Medicine | Admitting: Emergency Medicine

## 2017-07-22 DIAGNOSIS — Z8051 Family history of malignant neoplasm of kidney: Secondary | ICD-10-CM | POA: Insufficient documentation

## 2017-07-22 DIAGNOSIS — F17210 Nicotine dependence, cigarettes, uncomplicated: Secondary | ICD-10-CM | POA: Insufficient documentation

## 2017-07-22 DIAGNOSIS — Z23 Encounter for immunization: Secondary | ICD-10-CM | POA: Insufficient documentation

## 2017-07-22 DIAGNOSIS — Y93G1 Activity, food preparation and clean up: Secondary | ICD-10-CM | POA: Diagnosis not present

## 2017-07-22 DIAGNOSIS — Z79899 Other long term (current) drug therapy: Secondary | ICD-10-CM | POA: Diagnosis not present

## 2017-07-22 DIAGNOSIS — Y999 Unspecified external cause status: Secondary | ICD-10-CM | POA: Insufficient documentation

## 2017-07-22 DIAGNOSIS — I10 Essential (primary) hypertension: Secondary | ICD-10-CM | POA: Diagnosis not present

## 2017-07-22 DIAGNOSIS — Z880 Allergy status to penicillin: Secondary | ICD-10-CM | POA: Insufficient documentation

## 2017-07-22 DIAGNOSIS — W260XXA Contact with knife, initial encounter: Secondary | ICD-10-CM | POA: Diagnosis not present

## 2017-07-22 DIAGNOSIS — Y929 Unspecified place or not applicable: Secondary | ICD-10-CM | POA: Insufficient documentation

## 2017-07-22 DIAGNOSIS — B20 Human immunodeficiency virus [HIV] disease: Secondary | ICD-10-CM | POA: Insufficient documentation

## 2017-07-22 DIAGNOSIS — Z91040 Latex allergy status: Secondary | ICD-10-CM | POA: Diagnosis not present

## 2017-07-22 DIAGNOSIS — S61211A Laceration without foreign body of left index finger without damage to nail, initial encounter: Secondary | ICD-10-CM | POA: Diagnosis not present

## 2017-07-22 NOTE — ED Notes (Signed)
Bed: WA05 Expected date:  Expected time:  Means of arrival:  Comments: 

## 2017-07-22 NOTE — ED Triage Notes (Addendum)
Patient was trying to cut some frozen sausage and sliced his left pointer finger. Patient has stopped the bleeding. Patient is Hiv positive.

## 2017-07-23 MED ORDER — DOXYCYCLINE HYCLATE 100 MG PO CAPS: 100.0000 mg | ORAL_CAPSULE | Freq: Two times a day (BID) | ORAL | 0 refills | Status: DC

## 2017-07-23 MED ORDER — TETANUS-DIPHTH-ACELL PERTUSSIS 5-2.5-18.5 LF-MCG/0.5 IM SUSP
0.5000 mL | Freq: Once | INTRAMUSCULAR | Status: AC
Start: 2017-07-23 — End: 2017-07-23
  Administered 2017-07-23: 0.5 mL via INTRAMUSCULAR
  Filled 2017-07-23: qty 0.5

## 2017-07-23 NOTE — ED Notes (Signed)
PT DISCHARGED. INSTRUCTIONS AND PRESCRIPTION GIVEN. AAOX4. PT IN NO APPARENT DISTRESS OR PAIN. THE OPPORTUNITY TO ASK QUESTIONS WAS PROVIDED. 

## 2017-07-23 NOTE — Discharge Instructions (Signed)
Take doxycycline as prescribed to prevent infection. You may use ibuprofen or tylenol for pain.

## 2017-07-23 NOTE — ED Provider Notes (Signed)
[facility] DEPT Provider Note   CSN: [id number] Arrival date & time: 07/22/17  2310     History   Chief Complaint Chief Complaint  Patient presents with  . Extremity Laceration    HPI Drew Cuevas is a 46 y.o. male.  The history is provided by the patient. No language interpreter was used.  Laceration   The incident occurred 1 to 2 hours ago. The laceration is located on the left hand (L index finger). The laceration is 1 cm in size. The laceration mechanism was a a dirty knife. Pain scale: minimal. The pain is mild. The pain has been constant since onset. He reports no foreign bodies present. His tetanus status is unknown.    Past Medical History:  Diagnosis Date  . Bronchitis    LAST FLARE UP WAS NEW YEARS 2015  . Cancer ([place])    kidney  . GERD (gastroesophageal reflux disease)    NO MEDS  . HIV (human immunodeficiency virus infection) ([place])    UNDER CONTROL WITH MEDICATIONS  . Hypertension   . Immune deficiency disorder ([place])   . Renal insufficiency 05/17/2015  . Renal mass, right     Patient Active Problem List   Diagnosis Date Noted  . Anal itching 04/03/2017  . Primary osteoarthritis of right knee 04/03/2017  . Dyslipidemia 06/14/2016  . Renal cell carcinoma of right kidney ([place]) 08/19/2013  . Renal cyst 05/21/2013  . Obesity (BMI 35.0-39.9 without comorbidity) 05/26/2012  . Right knee pain 04/30/2011  . Internal hemorrhoids 12/01/2008  . Essential hypertension 04/28/2008  . DEPRESSION, ACUTE 12/20/2006  . Human immunodeficiency virus (HIV) disease ([place]) 08/28/2006    Past Surgical History:  Procedure Laterality Date  . NO PAST SURGERIES    . ROBOT ASSISTED LAPAROSCOPIC NEPHRECTOMY Right 08/19/2013   Procedure: ROBOTIC ASSISTED LAPAROSCOPIC RIGHT PARTIAL NEPHRECTOMY;  Surgeon: Ardis Hughs, MD;  Location: [facility];  Service: Urology;  Laterality: Right;       Home Medications    Prior to Admission medications    Medication Sig Start Date End Date Taking? Authorizing Provider  amLODipine (NORVASC) 5 MG tablet TAKE 1 TABLET(5 MG) BY MOUTH DAILY 05/13/17   Tresa Garter, MD  citalopram (CELEXA) 20 MG tablet TAKE 1 TABLET(20 MG) BY MOUTH DAILY 05/13/17   Tresa Garter, MD  diphenhydrAMINE (BENADRYL) 25 mg capsule Take 25 mg by mouth every 6 (six) hours as needed for itching or allergies.    [provider]  doxycycline (VIBRAMYCIN) 100 MG capsule Take 1 capsule (100 mg total) by mouth 2 (two) times daily. 07/23/17   Antonietta Breach, PA-C  hydrocortisone (ANUSOL-HC) 2.5 % rectal cream Apply rectally 2 times daily for 7 days. Patient not taking: Reported on 04/29/2017 03/21/17   Long, Wonda Olds, MD  hydrocortisone cream 0.5 % Apply 1 application topically daily as needed for itching.    [provider]  lidocaine-hydrocortisone (ANAMANTEL HC) 3-0.5 % CREA Place 1 Applicatorful rectally 2 (two) times daily. 04/03/17   Tresa Garter, MD  pravastatin (PRAVACHOL) 20 MG tablet TAKE 1 TABLET(20 MG) BY MOUTH DAILY 05/13/17   Jegede, Olugbemiga E, MD  TRIUMEQ 600-50-300 MG tablet TAKE 1 TABLET BY MOUTH DAILY Patient taking differently: Take 1 tablet by mouth at bedtime.  11/16/16   Michel Bickers, MD  vitamin C (ASCORBIC ACID) 500 MG tablet Take 500 mg by mouth at bedtime.     [provider]    Family History Family History  Problem Relation Age of Onset  . Hypertension Mother   . Hypertension Sister   . Hypertension Brother     Social History Social History   Tobacco Use  . Smoking status: Current Every Day Smoker    Packs/day: 0.50    Years: 30.00    Pack years: 15.00    Types: Cigarettes    Last attempt to quit: 11/28/2015    Years since quitting: 1.6  . Smokeless tobacco: Never Used  . Tobacco comment: stopped May 2017  Substance Use Topics  . Alcohol use: No    Comment: stopped 10-12 years ago  . Drug use: No     Allergies   Ibuprofen; Penicillins;  and Latex   Review of Systems Review of Systems Ten systems reviewed and are negative for acute change, except as noted in the HPI.    Physical Exam Updated Vital Signs BP (!) 139/103 (BP Location: Left Arm)   Pulse 86   Temp 98.3 F (36.8 C) (Oral)   Resp 18   Ht 5\' 6"  (1.676 m)   Wt 103.4 kg (228 lb)   SpO2 98%   BMI 36.80 kg/m   Physical Exam  Constitutional: He is oriented to person, place, and time. He appears well-developed and well-nourished. No distress.  Nontoxic appearing and in NAD  HENT:  Head: Normocephalic and atraumatic.  Eyes: Conjunctivae and EOM are normal. No scleral icterus.  Neck: Normal range of motion.  Cardiovascular: Normal rate, regular rhythm and intact distal pulses.  Distal radial pulse 2+ in the LUE. Capillary refill brisk in all digits of the L hand.  Pulmonary/Chest: Effort normal. No respiratory distress.  Respirations even and unlabored  Musculoskeletal: Normal range of motion.       Left hand: He exhibits tenderness and laceration (superficial laceration to the medial aspect of the L index finger.). He exhibits normal range of motion, no bony tenderness and normal capillary refill. Normal sensation noted. Normal strength noted.  Neurological: He is alert and oriented to person, place, and time. He exhibits normal muscle tone. Coordination normal.  Sensation to light touch intact in all digits of the L hand.  Skin: Skin is warm and dry. No rash noted. He is not diaphoretic. No erythema. No pallor.  Psychiatric: He has a normal mood and affect. His behavior is normal.  Nursing note and vitals reviewed.    ED Treatments / Results  Labs (all labs ordered are listed, but only abnormal results are displayed) Labs Reviewed - No data to display  EKG  EKG Interpretation None       Radiology No results found.  Procedures Procedures (including critical care time)  LACERATION REPAIR Performed by: Antonietta Breach Authorized by: Antonietta Breach Consent: Verbal consent obtained. Risks and benefits: risks, benefits and alternatives were discussed Consent given by: patient Patient identity confirmed: provided demographic data Prepped and Draped in normal sterile fashion Wound explored  Laceration Location: L index  Laceration Length: 1cm  No Foreign Bodies seen or palpated  Irrigation method: syringe Amount of cleaning: standard  Skin closure: Dermabond  Number of sutures: n/a  Technique: simple  Patient tolerance: Patient tolerated the procedure well with no immediate complications.   Medications Ordered in ED Medications  Tdap (BOOSTRIX) injection 0.5 mL (not administered)     Initial Impression / Assessment and Plan / ED Course  I have reviewed the triage vital signs and the nursing notes.  Pertinent labs & imaging results that were available during my  care of the patient were reviewed by me and considered in my medical decision making (see chart for details).     Tdap booster given. Laceration occurred < 8 hours prior to repair which was well tolerated. Pt has no comorbidities to effect normal wound healing. Discussed suture home care with pt and answered questions. Pt to follow up with his PCP for wound check PRN. Pt is hemodynamically stable with no complaints prior to discharge.     Final Clinical Impressions(s) / ED Diagnoses   Final diagnoses:  Laceration of left index finger without foreign body without damage to nail, initial encounter    ED Discharge Orders        Ordered    doxycycline (VIBRAMYCIN) 100 MG capsule  2 times daily     07/23/17 0109       Antonietta Breach, PA-C 07/23/17 0121    Antonietta Breach, PA-C 40/10/27 2536    Glick, David, MD 64/40/34 [phone]

## 2017-08-03 ENCOUNTER — Other Ambulatory Visit: Payer: Self-pay | Admitting: Internal Medicine

## 2017-08-03 DIAGNOSIS — B20 Human immunodeficiency virus [HIV] disease: Secondary | ICD-10-CM

## 2017-08-07 ENCOUNTER — Other Ambulatory Visit: Payer: Self-pay | Admitting: Internal Medicine

## 2017-08-07 DIAGNOSIS — F331 Major depressive disorder, recurrent, moderate: Secondary | ICD-10-CM

## 2017-08-07 DIAGNOSIS — I10 Essential (primary) hypertension: Secondary | ICD-10-CM

## 2017-08-07 DIAGNOSIS — E785 Hyperlipidemia, unspecified: Secondary | ICD-10-CM

## 2017-09-03 ENCOUNTER — Emergency Department (HOSPITAL_COMMUNITY)
Admission: EM | Admit: 2017-09-03 | Discharge: 2017-09-03 | Disposition: A | Discharge status: Home / Self Care | Payer: BLUE CROSS/BLUE SHIELD

## 2017-09-03 NOTE — ED Notes (Signed)
No answer when called for vital sign check

## 2017-11-05 ENCOUNTER — Other Ambulatory Visit: Payer: Self-pay | Admitting: Internal Medicine

## 2017-11-05 DIAGNOSIS — I10 Essential (primary) hypertension: Secondary | ICD-10-CM

## 2017-11-05 DIAGNOSIS — E785 Hyperlipidemia, unspecified: Secondary | ICD-10-CM

## 2017-11-05 DIAGNOSIS — F331 Major depressive disorder, recurrent, moderate: Secondary | ICD-10-CM

## 2017-11-06 ENCOUNTER — Encounter: Payer: Self-pay | Admitting: Internal Medicine

## 2017-11-06 ENCOUNTER — Other Ambulatory Visit: Payer: Self-pay | Admitting: Internal Medicine

## 2017-11-06 DIAGNOSIS — F331 Major depressive disorder, recurrent, moderate: Secondary | ICD-10-CM

## 2017-11-06 DIAGNOSIS — I10 Essential (primary) hypertension: Secondary | ICD-10-CM

## 2017-11-06 DIAGNOSIS — E785 Hyperlipidemia, unspecified: Secondary | ICD-10-CM

## 2017-11-07 MED ORDER — CITALOPRAM HYDROBROMIDE 20 MG PO TABS: 20.0000 mg | ORAL_TABLET | Freq: Every day | ORAL | 1 refills | Status: DC

## 2017-11-07 MED ORDER — AMLODIPINE BESYLATE 5 MG PO TABS: 5.0000 mg | ORAL_TABLET | Freq: Every day | ORAL | 0 refills | Status: DC

## 2017-11-07 MED ORDER — PRAVASTATIN SODIUM 20 MG PO TABS: 20.0000 mg | ORAL_TABLET | Freq: Every day | ORAL | 0 refills | Status: DC

## 2017-12-03 ENCOUNTER — Other Ambulatory Visit: Payer: Self-pay | Admitting: Nurse Practitioner

## 2017-12-03 DIAGNOSIS — E785 Hyperlipidemia, unspecified: Secondary | ICD-10-CM

## 2017-12-03 DIAGNOSIS — I10 Essential (primary) hypertension: Secondary | ICD-10-CM

## 2017-12-10 ENCOUNTER — Other Ambulatory Visit: Payer: Self-pay

## 2017-12-10 DIAGNOSIS — I10 Essential (primary) hypertension: Secondary | ICD-10-CM

## 2017-12-10 DIAGNOSIS — E785 Hyperlipidemia, unspecified: Secondary | ICD-10-CM

## 2017-12-10 MED ORDER — PRAVASTATIN SODIUM 20 MG PO TABS: 20.0000 mg | ORAL_TABLET | Freq: Every day | ORAL | 0 refills | Status: DC

## 2017-12-10 MED ORDER — AMLODIPINE BESYLATE 5 MG PO TABS: 5.0000 mg | ORAL_TABLET | Freq: Every day | ORAL | 0 refills | Status: DC

## 2017-12-28 ENCOUNTER — Other Ambulatory Visit: Payer: Self-pay | Admitting: Nurse Practitioner

## 2017-12-28 DIAGNOSIS — F331 Major depressive disorder, recurrent, moderate: Secondary | ICD-10-CM

## 2018-01-13 ENCOUNTER — Ambulatory Visit: Payer: BLUE CROSS/BLUE SHIELD | Attending: Internal Medicine | Admitting: Internal Medicine

## 2018-01-13 ENCOUNTER — Encounter: Payer: Self-pay | Admitting: Internal Medicine

## 2018-01-13 VITALS — BP 127/89 | HR 105 | Temp 98.4°F | Resp 16 | Ht 65.0 in | Wt 232.8 lb

## 2018-01-13 DIAGNOSIS — Z6830 Body mass index (BMI) 30.0-30.9, adult: Secondary | ICD-10-CM | POA: Insufficient documentation

## 2018-01-13 DIAGNOSIS — Z905 Acquired absence of kidney: Secondary | ICD-10-CM | POA: Insufficient documentation

## 2018-01-13 DIAGNOSIS — M1711 Unilateral primary osteoarthritis, right knee: Secondary | ICD-10-CM | POA: Diagnosis not present

## 2018-01-13 DIAGNOSIS — Z131 Encounter for screening for diabetes mellitus: Secondary | ICD-10-CM | POA: Insufficient documentation

## 2018-01-13 DIAGNOSIS — F329 Major depressive disorder, single episode, unspecified: Secondary | ICD-10-CM

## 2018-01-13 DIAGNOSIS — Z8249 Family history of ischemic heart disease and other diseases of the circulatory system: Secondary | ICD-10-CM | POA: Diagnosis not present

## 2018-01-13 DIAGNOSIS — I10 Essential (primary) hypertension: Secondary | ICD-10-CM | POA: Diagnosis present

## 2018-01-13 DIAGNOSIS — F32A Depression, unspecified: Secondary | ICD-10-CM

## 2018-01-13 DIAGNOSIS — Z880 Allergy status to penicillin: Secondary | ICD-10-CM | POA: Insufficient documentation

## 2018-01-13 DIAGNOSIS — E785 Hyperlipidemia, unspecified: Secondary | ICD-10-CM | POA: Diagnosis not present

## 2018-01-13 DIAGNOSIS — Z7952 Long term (current) use of systemic steroids: Secondary | ICD-10-CM | POA: Insufficient documentation

## 2018-01-13 DIAGNOSIS — Z9889 Other specified postprocedural states: Secondary | ICD-10-CM | POA: Insufficient documentation

## 2018-01-13 DIAGNOSIS — E669 Obesity, unspecified: Secondary | ICD-10-CM | POA: Diagnosis not present

## 2018-01-13 DIAGNOSIS — F17200 Nicotine dependence, unspecified, uncomplicated: Secondary | ICD-10-CM | POA: Diagnosis not present

## 2018-01-13 DIAGNOSIS — Z886 Allergy status to analgesic agent status: Secondary | ICD-10-CM | POA: Insufficient documentation

## 2018-01-13 DIAGNOSIS — F17210 Nicotine dependence, cigarettes, uncomplicated: Secondary | ICD-10-CM | POA: Diagnosis not present

## 2018-01-13 LAB — POCT GLYCOSYLATED HEMOGLOBIN (HGB A1C): HEMOGLOBIN A1C: 5.4 % (ref 4.0–5.6)

## 2018-01-13 LAB — GLUCOSE, POCT (MANUAL RESULT ENTRY): POC Glucose: 99 mg/dl (ref 70–99)

## 2018-01-13 MED ORDER — NICOTINE 14 MG/24HR TD PT24: 14.0000 mg | MEDICATED_PATCH | Freq: Every day | TRANSDERMAL | 0 refills | Status: DC

## 2018-01-13 MED ORDER — AMLODIPINE BESYLATE 5 MG PO TABS: 5.0000 mg | ORAL_TABLET | Freq: Every day | ORAL | 6 refills | Status: DC

## 2018-01-13 MED ORDER — PRAVASTATIN SODIUM 20 MG PO TABS: 20.0000 mg | ORAL_TABLET | Freq: Every day | ORAL | 6 refills | Status: DC

## 2018-01-13 MED ORDER — CITALOPRAM HYDROBROMIDE 20 MG PO TABS: 20.0000 mg | ORAL_TABLET | Freq: Every day | ORAL | 6 refills | Status: DC

## 2018-01-13 MED ORDER — NICOTINE 21 MG/24HR TD PT24: 21.0000 mg | MEDICATED_PATCH | Freq: Every day | TRANSDERMAL | 0 refills | Status: DC

## 2018-01-13 NOTE — Progress Notes (Signed)
Patient ID: Drew Cuevas, male    DOB: 09/12/1970  MRN: [id number]  CC: re-establish and Hypertension   Subjective: Drew Cuevas is a 47 y.o. male who presents for chronic ds management and to est with me as PCP.  Previous PCP was Dr. check a day His concerns today include:  HTN, HL, HIV,OA RT knee, rectal fistulectomy, Renal cell CA s/p partial nephrology, depression  The last time patient was seen he had an A1c done as part of routine blood work.  Level was 7.8.  In his lab note Dr. Doreene Burke recommend taking glipizide.  Patient states that he took it for 2 weeks and his blood sugars were dropping too low so he discontinued taking it.  No family history of diabetes.   Eating Habits:  Cut of sodas x 3 mths. Drinks one occasionally.  Using KoolAid sugar single packs to give his water flavor.  Loves potatoe chips, honeybun and cheese.  HL: Reports compliance with pravastatin.  He is tolerating the medication okay.  HTN: Reports compliance with amlodipine.  He limit salt in the foods.  Tobacco dependence: He has smoked since the age of 6.  He smokes about 8 cigarettes a day.  He would like to quit.  At one time he quit for 7 months while incarcerated.  Patient Active Problem List   Diagnosis Date Noted  . Anal itching 04/03/2017  . Primary osteoarthritis of right knee 04/03/2017  . Dyslipidemia 06/14/2016  . Renal cell carcinoma of right kidney ([place]) 08/19/2013  . Renal cyst 05/21/2013  . Obesity (BMI 35.0-39.9 without comorbidity) 05/26/2012  . Right knee pain 04/30/2011  . Internal hemorrhoids 12/01/2008  . Essential hypertension 04/28/2008  . DEPRESSION, ACUTE 12/20/2006  . Human immunodeficiency virus (HIV) disease ([place]) 08/28/2006     Current Outpatient Medications on File Prior to Visit  Medication Sig Dispense Refill  . TRIUMEQ 600-50-300 MG tablet TAKE 1 TABLET BY MOUTH DAILY 30 tablet 5  . vitamin C (ASCORBIC ACID) 500 MG tablet Take 500 mg by mouth at bedtime.     .  diphenhydrAMINE (BENADRYL) 25 mg capsule Take 25 mg by mouth every 6 (six) hours as needed for itching or allergies.    . hydrocortisone cream 0.5 % Apply 1 application topically daily as needed for itching.     No current facility-administered medications on file prior to visit.     Allergies  Allergen Reactions  . Ibuprofen Anaphylaxis, Swelling and Other (See Comments)    Dehydration Swelling of the "moist areas" (throat, mouth)  . Penicillins Hives    Has patient had a PCN reaction causing immediate rash, facial/tongue/throat swelling, SOB or lightheadedness with hypotension: yes Has patient had a PCN reaction causing severe rash involving mucus membranes or skin necrosis: no-- pt had hives Has patient had a PCN reaction that required hospitalization no Has patient had a PCN reaction occurring within the last 10 years: no If all of the above answers are "NO", then may proceed with Cephalosporin use.   . Latex Rash    Social History   Socioeconomic History  . Marital status: Single    Spouse name: Not on file  . Number of children: Not on file  . Years of education: Not on file  . Highest education level: Not on file  Occupational History  . Not on file  Social Needs  . Financial resource strain: Not on file  . Food insecurity:    Worry: Not on file  Inability: Not on file  . Transportation needs:    Medical: Not on file    Non-medical: Not on file  Tobacco Use  . Smoking status: Current Every Day Smoker    Packs/day: 0.50    Years: 30.00    Pack years: 15.00    Types: Cigarettes    Last attempt to quit: 11/28/2015    Years since quitting: 2.1  . Smokeless tobacco: Never Used  . Tobacco comment: stopped May 2017  Substance and Sexual Activity  . Alcohol use: No    Comment: stopped 10-12 years ago  . Drug use: No  . Sexual activity: Not Currently    Partners: Male    Comment: declined condoms  Lifestyle  . Physical activity:    Days per week: Not on file     Minutes per session: Not on file  . Stress: Not on file  Relationships  . Social connections:    Talks on phone: Not on file    Gets together: Not on file    Attends religious service: Not on file    Active member of club or organization: Not on file    Attends meetings of clubs or organizations: Not on file    Relationship status: Not on file  . Intimate partner violence:    Fear of current or ex partner: Not on file    Emotionally abused: Not on file    Physically abused: Not on file    Forced sexual activity: Not on file  Other Topics Concern  . Not on file  Social History Narrative  . Not on file    Family History  Problem Relation Age of Onset  . Hypertension Mother   . Hypertension Sister   . Hypertension Brother     Past Surgical History:  Procedure Laterality Date  . NO PAST SURGERIES    . ROBOT ASSISTED LAPAROSCOPIC NEPHRECTOMY Right 08/19/2013   Procedure: ROBOTIC ASSISTED LAPAROSCOPIC RIGHT PARTIAL NEPHRECTOMY;  Surgeon: Ardis Hughs, MD;  Location: [facility];  Service: Urology;  Laterality: Right;    ROS: Review of Systems Negative except as stated above. PHYSICAL EXAM: BP 127/89   Pulse (!) 105   Temp 98.4 F (36.9 C) (Oral)   Resp 16   Ht 5\' 5"  (1.651 m)   Wt 232 lb 12.8 oz (105.6 kg)   SpO2 95%   BMI 38.74 kg/m   Wt Readings from Last 3 Encounters:  01/13/18 232 lb 12.8 oz (105.6 kg)  07/22/17 228 lb (103.4 kg)  04/29/17 221 lb (100.2 kg)    Physical Exam General appearance - alert, well appearing, middle-aged African-American male and in no distress Mental status - normal mood, behavior, speech, dress, motor activity, and thought processes Mouth - mucous membranes moist, pharynx normal without lesions Neck - supple, no significant adenopathy Chest - clear to auscultation, no wheezes, rales or rhonchi, symmetric air entry Heart - normal rate, regular rhythm, normal S1, S2, no murmurs, rubs, clicks or gallops Extremities - peripheral  pulses normal, no pedal edema, no clubbing or cyanosis  Results for orders placed or performed in visit on 01/13/18  POCT glucose (manual entry)  Result Value Ref Range   POC Glucose 99 70 - 99 mg/dl  POCT glycosylated hemoglobin (Hb A1C)  Result Value Ref Range   Hemoglobin A1C 5.4 4.0 - 5.6 %   HbA1c, POC (prediabetic range)  5.7 - 6.4 %   HbA1c, POC (controlled diabetic range)  0.0 - 7.0 %  ASSESSMENT AND PLAN: 1. Essential hypertension Close to goal.  Continue amlodipine. - CBC - Comprehensive metabolic panel - amLODipine (NORVASC) 5 MG tablet; Take 1 tablet (5 mg total) by mouth daily.  Dispense: 30 tablet; Refill: 6  2. Obesity (BMI 30-39.9) Spent some time discussing healthy eating plan with him.  Advised to snack on fruits or vegetables instead of junk foods.  Advised to cut back on white carbohydrates and sugary drinks. He will try to move more.  He does have gym membership.  Aim for goal of 150 minutes of aerobic exercise per week  3. Depression, unspecified depression type Doing well on Celexa - citalopram (CELEXA) 20 MG tablet; Take 1 tablet (20 mg total) by mouth daily.  Dispense: 30 tablet; Refill: 6  4. Dyslipidemia - Lipid panel - pravastatin (PRAVACHOL) 20 MG tablet; Take 1 tablet (20 mg total) by mouth daily.  Dispense: 30 tablet; Refill: 6  5. Tobacco dependence Patient advised to quit smoking. Discussed health risks associated with smoking including lung and other types of cancers, chronic lung diseases and CV risks.. Pt ready to give trail of quitting.  Discussed methods to help quit including quitting cold [place], use of NRT, Chantix and Bupropion.  - nicotine (NICODERM CQ - DOSED IN MG/24 HOURS) 21 mg/24hr patch; Place 1 patch (21 mg total) onto the skin daily.  Dispense: 28 patch; Refill: 0 - nicotine (NICODERM CQ - DOSED IN MG/24 HOURS) 14 mg/24hr patch; Place 1 patch (14 mg total) onto the skin daily.  Dispense: 28 patch; Refill: 0 Less than 5 minutes  spent on counseling  6. Diabetes mellitus screening Based on screening test done today he is not diabetic. - POCT glucose (manual entry) - POCT glycosylated hemoglobin (Hb A1C)   Patient was given the opportunity to ask questions.  Patient verbalized understanding of the plan and was able to repeat key elements of the plan.   Orders Placed This Encounter  Procedures  . CBC  . Comprehensive metabolic panel  . Lipid panel  . POCT glucose (manual entry)  . POCT glycosylated hemoglobin (Hb A1C)     Requested Prescriptions   Signed Prescriptions Disp Refills  . citalopram (CELEXA) 20 MG tablet 30 tablet 6    Sig: Take 1 tablet (20 mg total) by mouth daily.  . nicotine (NICODERM CQ - DOSED IN MG/24 HOURS) 21 mg/24hr patch 28 patch 0    Sig: Place 1 patch (21 mg total) onto the skin daily.  . nicotine (NICODERM CQ - DOSED IN MG/24 HOURS) 14 mg/24hr patch 28 patch 0    Sig: Place 1 patch (14 mg total) onto the skin daily.  . pravastatin (PRAVACHOL) 20 MG tablet 30 tablet 6    Sig: Take 1 tablet (20 mg total) by mouth daily.  Marland Kitchen amLODipine (NORVASC) 5 MG tablet 30 tablet 6    Sig: Take 1 tablet (5 mg total) by mouth daily.    Return in about 3 months (around 04/15/2018).  Karle Plumber, MD, FACP

## 2018-01-13 NOTE — Patient Instructions (Signed)
Smoking Tobacco Information Smoking tobacco will very likely harm your health. Tobacco contains a poisonous (toxic), colorless chemical called nicotine. Nicotine affects the brain and makes tobacco addictive. This change in your brain can make it hard to stop smoking. Tobacco also has other toxic chemicals that can hurt your body and raise your risk of many cancers. How can smoking tobacco affect me? Smoking tobacco can increase your chances of having serious health conditions, such as:  Cancer. Smoking is most commonly associated with lung cancer, but can lead to cancer in other parts of the body.  Chronic obstructive pulmonary disease (COPD). This is a long-term lung condition that makes it hard to breathe. It also gets worse over time.  High blood pressure (hypertension), heart disease, stroke, or heart attack.  Lung infections, such as pneumonia.  Cataracts. This is when the lenses in the eyes become clouded.  Digestive problems. This may include peptic ulcers, heartburn, and gastroesophageal reflux disease (GERD).  Oral health problems, such as gum disease and tooth loss.  Loss of taste and smell.  Smoking can affect your appearance by causing:  Wrinkles.  Yellow or stained teeth, fingers, and fingernails.  Smoking tobacco can also affect your social life.  Many workplaces, restaurants, hotels, and public places are tobacco-free. This means that you may experience challenges in finding places to smoke when away from home.  The cost of a smoking habit can be expensive. Expenses for someone who smokes come in two ways: ? You spend money on a regular basis to buy tobacco. ? Your health care costs in the long-term are higher if you smoke.  Tobacco smoke can also affect the health of those around you. Children of smokers have greater chances of: ? Sudden infant death syndrome (SIDS). ? Ear infections. ? Lung infections.  What lifestyle changes can be made?  Do not start  smoking. Quit if you already do.  To quit smoking: ? Make a plan to quit smoking and commit yourself to it. Look for programs to help you and ask your health care provider for recommendations and ideas. ? Talk with your health care provider about using nicotine replacement medicines to help you quit. Medicine replacement medicines include gum, lozenges, patches, sprays, or pills. ? Do not replace cigarette smoking with electronic cigarettes, which are commonly called e-cigarettes. The safety of e-cigarettes is not known, and some may contain harmful chemicals. ? Avoid places, people, or situations that tempt you to smoke. ? If you try to quit but return to smoking, don't give up hope. It is very common for people to try a number of times before they fully succeed. When you feel ready again, give it another try.  Quitting smoking might affect the way you eat as well as your weight. Be prepared to monitor your eating habits. Get support in planning and following a healthy diet.  Ask your health care provider about having regular tests (screenings) to check for cancer. This may include blood tests, imaging tests, and other tests.  Exercise regularly. Consider taking walks, joining a gym, or doing yoga or exercise classes.  Develop skills to manage your stress. These skills include meditation. What are the benefits of quitting smoking? By quitting smoking, you may:  Lower your risk of getting cancer and other diseases caused by smoking.  Live longer.  Breathe better.  Lower your blood pressure and heart rate.  Stop your addiction to tobacco.  Stop creating secondhand smoke that hurts other people.  Improve your   sense of taste and smell.  Look better over time, due to having fewer wrinkles and less staining.  What can happen if changes are not made? If you do not stop smoking, you may:  Get cancer and other diseases.  Develop COPD or other long-term (chronic) lung  conditions.  Develop serious problems with your heart and blood vessels (cardiovascular system).  Need more tests to screen for problems caused by smoking.  Have higher, long-term healthcare costs from medicines or treatments related to smoking.  Continue to have worsening changes in your lungs, mouth, and nose.  Where to find support: To get support to quit smoking, consider:  Asking your health care provider for more information and resources.  Taking classes to learn more about quitting smoking.  Looking for local organizations that offer resources about quitting smoking.  Joining a support group for people who want to quit smoking in your local community.  Where to find more information: You may find more information about quitting smoking from:  HelpGuide.org: www.helpguide.org/articles/addictions/how-to-quit-smoking.htm  https://hall.com/: smokefree.gov  American Lung Association: www.lung.org  Contact a health care provider if:  You have problems breathing.  Your lips, nose, or fingers turn blue.  You have chest pain.  You are coughing up blood.  You feel faint or you pass out.  You have other noticeable changes that cause you to worry. Summary  Smoking tobacco can negatively affect your health, the health of those around you, your finances, and your social life.  Do not start smoking. Quit if you already do. If you need help quitting, ask your health care provider.  Think about joining a support group for people who want to quit smoking in your local community. There are many effective programs that will help you to quit this behavior. This information is not intended to replace advice given to you by your health care provider. Make sure you discuss any questions you have with your health care provider. Document Released: 07/31/2016 Document Revised: 07/31/2016 Document Reviewed: 07/31/2016 Elsevier Interactive Patient Education  2018 [employer].   Follow  a Healthy Eating Plan - You can do it! Limit sugary drinks.  Avoid sodas, sweet tea, sport or energy drinks, or fruit drinks.  Drink water, lo-fat milk, or diet drinks. Limit snack foods.   Cut back on candy, cake, cookies, chips, ice cream.  These are a special treat, only in small amounts. Eat plenty of vegetables.  Especially dark green, red, and orange vegetables. Aim for at least 3 servings a day. More is better! Include fruit in your daily diet.  Whole fruit is much healthier than fruit juice! Limit "white" bread, "white" pasta, "white" rice.   Choose "100% whole grain" products, brown or wild rice. Avoid fatty meats. Try "Meatless Monday" and choose eggs or beans one day a week.  When eating meat, choose lean meats like chicken, [place], and fish.  Grill, broil, or bake meats instead of frying, and eat poultry without the skin. Eat less salt.  Avoid frozen pizzas, frozen dinners and salty foods.  Use seasonings other than salt in cooking.  This can help blood pressure and keep you from swelling Beer, wine and liquor have calories.  If you can safely drink alcohol, limit to 1 drink per day for women, 2 drinks for men

## 2018-01-14 LAB — COMPREHENSIVE METABOLIC PANEL
ALT: 33 IU/L (ref 0–44)
AST: 20 IU/L (ref 0–40)
Albumin/Globulin Ratio: 1.8 (ref 1.2–2.2)
Albumin: 4.4 g/dL (ref 3.5–5.5)
Alkaline Phosphatase: 87 IU/L (ref 39–117)
BILIRUBIN TOTAL: 0.3 mg/dL (ref 0.0–1.2)
BUN/Creatinine Ratio: 8 — ABNORMAL LOW (ref 9–20)
BUN: 12 mg/dL (ref 6–24)
CALCIUM: 9.3 mg/dL (ref 8.7–10.2)
CHLORIDE: 109 mmol/L — AB (ref 96–106)
CO2: 20 mmol/L (ref 20–29)
Creatinine, Ser: 1.51 mg/dL — ABNORMAL HIGH (ref 0.76–1.27)
GFR calc Af Amer: 64 mL/min/{1.73_m2} (ref >59)
GFR, EST NON AFRICAN AMERICAN: 55 mL/min/{1.73_m2} — AB (ref >59)
GLUCOSE: 86 mg/dL (ref 65–99)
Globulin, Total: 2.5 g/dL (ref 1.5–4.5)
POTASSIUM: 4.6 mmol/L (ref 3.5–5.2)
Sodium: 143 mmol/L (ref 134–144)
TOTAL PROTEIN: 6.9 g/dL (ref 6.0–8.5)

## 2018-01-14 LAB — LIPID PANEL
Chol/HDL Ratio: 5.7 ratio — ABNORMAL HIGH (ref 0.0–5.0)
Cholesterol, Total: 201 mg/dL — ABNORMAL HIGH (ref 100–199)
HDL: 35 mg/dL — AB (ref >39)
LDL Calculated: 147 mg/dL — ABNORMAL HIGH (ref 0–99)
TRIGLYCERIDES: 96 mg/dL (ref 0–149)
VLDL CHOLESTEROL CAL: 19 mg/dL (ref 5–40)

## 2018-01-14 LAB — CBC
Hematocrit: 44.9 % (ref 37.5–51.0)
Hemoglobin: 15.3 g/dL (ref 13.0–17.7)
MCH: 32.6 pg (ref 26.6–33.0)
MCHC: 34.1 g/dL (ref 31.5–35.7)
MCV: 96 fL (ref 79–97)
PLATELETS: 234 10*3/uL (ref 150–450)
RBC: 4.69 x10E6/uL (ref 4.14–5.80)
RDW: 15 % (ref 12.3–15.4)
WBC: 5.9 10*3/uL (ref 3.4–10.8)

## 2018-01-28 ENCOUNTER — Other Ambulatory Visit: Payer: Self-pay | Admitting: Internal Medicine

## 2018-01-28 DIAGNOSIS — B20 Human immunodeficiency virus [HIV] disease: Secondary | ICD-10-CM

## 2018-03-04 ENCOUNTER — Other Ambulatory Visit: Payer: Self-pay | Admitting: Internal Medicine

## 2018-03-04 DIAGNOSIS — B20 Human immunodeficiency virus [HIV] disease: Secondary | ICD-10-CM

## 2018-03-12 ENCOUNTER — Emergency Department (HOSPITAL_COMMUNITY)
Admission: EM | Admit: 2018-03-12 | Discharge: 2018-03-12 | Discharge status: Against Medical Advice | Payer: BLUE CROSS/BLUE SHIELD | Attending: Emergency Medicine | Admitting: Emergency Medicine

## 2018-03-12 ENCOUNTER — Other Ambulatory Visit: Payer: Self-pay

## 2018-03-12 ENCOUNTER — Encounter (HOSPITAL_COMMUNITY): Payer: Self-pay

## 2018-03-12 DIAGNOSIS — Z5321 Procedure and treatment not carried out due to patient leaving prior to being seen by health care provider: Secondary | ICD-10-CM | POA: Insufficient documentation

## 2018-03-12 DIAGNOSIS — K6289 Other specified diseases of anus and rectum: Secondary | ICD-10-CM | POA: Diagnosis present

## 2018-03-12 NOTE — ED Triage Notes (Signed)
Pt reports boil inside rectum x2 weeks.

## 2018-03-14 NOTE — ED Notes (Signed)
Follow up call made  No answer  03/14/18  1403  s Hosey Burmester rn

## 2018-04-07 ENCOUNTER — Telehealth: Payer: Self-pay

## 2018-04-07 ENCOUNTER — Other Ambulatory Visit: Payer: Self-pay | Admitting: Internal Medicine

## 2018-04-07 DIAGNOSIS — B20 Human immunodeficiency virus [HIV] disease: Secondary | ICD-10-CM

## 2018-04-07 NOTE — Telephone Encounter (Signed)
Received refill request for patients Triumeq. Patient is due for an appointment with Dr. Megan Salon. Patient was able to schedule a lab appointment and office visit with our office during our call. Patient denies missing any days of medication. [place]

## 2018-04-09 ENCOUNTER — Other Ambulatory Visit: Payer: BLUE CROSS/BLUE SHIELD

## 2018-04-09 DIAGNOSIS — B20 Human immunodeficiency virus [HIV] disease: Secondary | ICD-10-CM

## 2018-04-09 DIAGNOSIS — Z113 Encounter for screening for infections with a predominantly sexual mode of transmission: Secondary | ICD-10-CM

## 2018-04-10 LAB — T-HELPER CELL (CD4) - (RCID CLINIC ONLY)
CD4 % Helper T Cell: 32 % — ABNORMAL LOW (ref 33–55)
CD4 T CELL ABS: 1130 /uL (ref 400–2700)

## 2018-04-11 LAB — HIV-1 RNA QUANT-NO REFLEX-BLD
HIV 1 RNA QUANT: 42 {copies}/mL — AB
HIV-1 RNA Quant, Log: 1.62 Log copies/mL — ABNORMAL HIGH

## 2018-04-11 LAB — RPR: RPR: NONREACTIVE

## 2018-04-15 ENCOUNTER — Ambulatory Visit: Payer: BLUE CROSS/BLUE SHIELD | Attending: Internal Medicine | Admitting: Internal Medicine

## 2018-04-15 ENCOUNTER — Encounter: Payer: Self-pay | Admitting: Internal Medicine

## 2018-04-15 VITALS — BP 122/84 | HR 82 | Temp 97.9°F | Resp 16 | Wt 232.4 lb

## 2018-04-15 DIAGNOSIS — K219 Gastro-esophageal reflux disease without esophagitis: Secondary | ICD-10-CM | POA: Diagnosis not present

## 2018-04-15 DIAGNOSIS — M1711 Unilateral primary osteoarthritis, right knee: Secondary | ICD-10-CM | POA: Insufficient documentation

## 2018-04-15 DIAGNOSIS — Z886 Allergy status to analgesic agent status: Secondary | ICD-10-CM | POA: Insufficient documentation

## 2018-04-15 DIAGNOSIS — E785 Hyperlipidemia, unspecified: Secondary | ICD-10-CM | POA: Diagnosis not present

## 2018-04-15 DIAGNOSIS — Z23 Encounter for immunization: Secondary | ICD-10-CM | POA: Diagnosis not present

## 2018-04-15 DIAGNOSIS — N183 Chronic kidney disease, stage 3 unspecified: Secondary | ICD-10-CM

## 2018-04-15 DIAGNOSIS — Z79899 Other long term (current) drug therapy: Secondary | ICD-10-CM | POA: Insufficient documentation

## 2018-04-15 DIAGNOSIS — Z9889 Other specified postprocedural states: Secondary | ICD-10-CM | POA: Insufficient documentation

## 2018-04-15 DIAGNOSIS — I129 Hypertensive chronic kidney disease with stage 1 through stage 4 chronic kidney disease, or unspecified chronic kidney disease: Secondary | ICD-10-CM | POA: Insufficient documentation

## 2018-04-15 DIAGNOSIS — C641 Malignant neoplasm of right kidney, except renal pelvis: Secondary | ICD-10-CM | POA: Insufficient documentation

## 2018-04-15 DIAGNOSIS — I10 Essential (primary) hypertension: Secondary | ICD-10-CM | POA: Diagnosis not present

## 2018-04-15 DIAGNOSIS — Z91040 Latex allergy status: Secondary | ICD-10-CM | POA: Insufficient documentation

## 2018-04-15 DIAGNOSIS — N1831 Chronic kidney disease, stage 3a: Secondary | ICD-10-CM | POA: Insufficient documentation

## 2018-04-15 DIAGNOSIS — Z905 Acquired absence of kidney: Secondary | ICD-10-CM | POA: Insufficient documentation

## 2018-04-15 DIAGNOSIS — Z6837 Body mass index (BMI) 37.0-37.9, adult: Secondary | ICD-10-CM | POA: Insufficient documentation

## 2018-04-15 DIAGNOSIS — B20 Human immunodeficiency virus [HIV] disease: Secondary | ICD-10-CM | POA: Insufficient documentation

## 2018-04-15 DIAGNOSIS — Z8249 Family history of ischemic heart disease and other diseases of the circulatory system: Secondary | ICD-10-CM | POA: Insufficient documentation

## 2018-04-15 DIAGNOSIS — F17210 Nicotine dependence, cigarettes, uncomplicated: Secondary | ICD-10-CM | POA: Insufficient documentation

## 2018-04-15 DIAGNOSIS — Z880 Allergy status to penicillin: Secondary | ICD-10-CM | POA: Insufficient documentation

## 2018-04-15 DIAGNOSIS — E669 Obesity, unspecified: Secondary | ICD-10-CM | POA: Insufficient documentation

## 2018-04-15 DIAGNOSIS — F329 Major depressive disorder, single episode, unspecified: Secondary | ICD-10-CM | POA: Insufficient documentation

## 2018-04-15 MED ORDER — RANITIDINE HCL 150 MG PO CAPS: 150.0000 mg | ORAL_CAPSULE | Freq: Two times a day (BID) | ORAL | 6 refills | Status: DC

## 2018-04-15 MED ORDER — PRAVASTATIN SODIUM 40 MG PO TABS: 40.0000 mg | ORAL_TABLET | Freq: Every day | ORAL | 6 refills | Status: DC

## 2018-04-15 NOTE — Progress Notes (Signed)
Patient ID: Drew Cuevas, male    DOB: [birth year]  MRN: [id number]  CC: Hypertension   Subjective: Drew Cuevas is a 47 y.o. male who presents for chronic disease management. His concerns today include:  HTN, HL, HIV,OA RT knee, rectal fistulectomy, Renal cell CA s/p partial nephrology, depression, CKD stage3   GERD;  Bother by acid reflux daily x 1 mth. Burning and nausea about 1-2 hrs after a meal and when he eats and lay down soon after eating.  Does not take NSAID  HTN:  Checks BP once a wk.  Gives range 130s/90 -tries to limit salt in his foods  HL:   We checked lipid profile on last visit.  His LDL was not at goal.  I did send him a message via MyChart but patient did not review the message.  He reports that he has always been compliant with taking the Pravachol.  I went over lab results from last visit.  His kidney function is not 100% but has improved from April of last year.   Patient Active Problem List   Diagnosis Date Noted  . Tobacco dependence 01/13/2018  . Primary osteoarthritis of right knee 04/03/2017  . Dyslipidemia 06/14/2016  . Renal cell carcinoma of right kidney ([place]) 08/19/2013  . Renal cyst 05/21/2013  . Obesity (BMI 30-39.9) 05/26/2012  . Internal hemorrhoids 12/01/2008  . Essential hypertension 04/28/2008  . DEPRESSION, ACUTE 12/20/2006  . Human immunodeficiency virus (HIV) disease ([place]) 08/28/2006     Current Outpatient Medications on File Prior to Visit  Medication Sig Dispense Refill  . amLODipine (NORVASC) 5 MG tablet Take 1 tablet (5 mg total) by mouth daily. 30 tablet 6  . citalopram (CELEXA) 20 MG tablet Take 1 tablet (20 mg total) by mouth daily. 30 tablet 6  . diphenhydrAMINE (BENADRYL) 25 mg capsule Take 25 mg by mouth every 6 (six) hours as needed for itching or allergies.    . hydrocortisone cream 0.5 % Apply 1 application topically daily as needed for itching.    . nicotine (NICODERM CQ - DOSED IN MG/24 HOURS) 14 mg/24hr patch Place  1 patch (14 mg total) onto the skin daily. 28 patch 0  . nicotine (NICODERM CQ - DOSED IN MG/24 HOURS) 21 mg/24hr patch Place 1 patch (21 mg total) onto the skin daily. 28 patch 0  . TRIUMEQ 600-50-300 MG tablet TAKE 1 TABLET BY MOUTH EVERY DAY 30 tablet 0  . vitamin C (ASCORBIC ACID) 500 MG tablet Take 500 mg by mouth at bedtime.      No current facility-administered medications on file prior to visit.     Allergies  Allergen Reactions  . Ibuprofen Anaphylaxis, Swelling and Other (See Comments)    Dehydration Swelling of the "moist areas" (throat, mouth)  . Penicillins Hives    Has patient had a PCN reaction causing immediate rash, facial/tongue/throat swelling, SOB or lightheadedness with hypotension: yes Has patient had a PCN reaction causing severe rash involving mucus membranes or skin necrosis: no-- pt had hives Has patient had a PCN reaction that required hospitalization no Has patient had a PCN reaction occurring within the last 10 years: no If all of the above answers are "NO", then may proceed with Cephalosporin use.   . Latex Rash    Social History   Socioeconomic History  . Marital status: Single    Spouse name: Not on file  . Number of children: Not on file  . Years of education: Not on file  .  Highest education level: Not on file  Occupational History  . Not on file  Social Needs  . Financial resource strain: Not on file  . Food insecurity:    Worry: Not on file    Inability: Not on file  . Transportation needs:    Medical: Not on file    Non-medical: Not on file  Tobacco Use  . Smoking status: Current Every Day Smoker    Packs/day: 0.50    Years: 30.00    Pack years: 15.00    Types: Cigarettes    Last attempt to quit: 11/28/2015    Years since quitting: 2.3  . Smokeless tobacco: Never Used  . Tobacco comment: stopped May 2017  Substance and Sexual Activity  . Alcohol use: No    Comment: stopped 10-12 years ago  . Drug use: No  . Sexual activity: Not  Currently    Partners: Male    Comment: declined condoms  Lifestyle  . Physical activity:    Days per week: Not on file    Minutes per session: Not on file  . Stress: Not on file  Relationships  . Social connections:    Talks on phone: Not on file    Gets together: Not on file    Attends religious service: Not on file    Active member of club or organization: Not on file    Attends meetings of clubs or organizations: Not on file    Relationship status: Not on file  . Intimate partner violence:    Fear of current or ex partner: Not on file    Emotionally abused: Not on file    Physically abused: Not on file    Forced sexual activity: Not on file  Other Topics Concern  . Not on file  Social History Narrative  . Not on file    Family History  Problem Relation Age of Onset  . Hypertension Mother   . Hypertension Sister   . Hypertension Brother     Past Surgical History:  Procedure Laterality Date  . NO PAST SURGERIES    . ROBOT ASSISTED LAPAROSCOPIC NEPHRECTOMY Right 08/19/2013   Procedure: ROBOTIC ASSISTED LAPAROSCOPIC RIGHT PARTIAL NEPHRECTOMY;  Surgeon: Ardis Hughs, MD;  Location: [facility];  Service: Urology;  Laterality: Right;    ROS: Review of Systems Negative except as above. PHYSICAL EXAM: BP 122/84   Pulse 82   Temp 97.9 F (36.6 C) (Oral)   Resp 16   Wt 232 lb 6.4 oz (105.4 kg)   SpO2 98%   BMI 37.51 kg/m   Physical Exam  General appearance - alert, well appearing, and in no distress Mental status - normal mood, behavior, speech, dress, motor activity, and thought processes Chest - clear to auscultation, no wheezes, rales or rhonchi, symmetric air entry Heart - normal rate, regular rhythm, normal S1, S2, no murmurs, rubs, clicks or gallops Abdomen - soft, nontender, nondistended, no masses or organomegaly Extremities - peripheral pulses normal, no pedal edema, no clubbing or cyanosis  Lab Results  Component Value Date   CHOL 201 (H)  01/13/2018   HDL 35 (L) 01/13/2018   LDLCALC 147 (H) 01/13/2018   TRIG 96 01/13/2018   CHOLHDL 5.7 (H) 01/13/2018     Chemistry      Component Value Date/Time   NA 143 01/13/2018 1653   K 4.6 01/13/2018 1653   CL 109 (H) 01/13/2018 1653   CO2 20 01/13/2018 1653   BUN 12 01/13/2018 1653  CREATININE 1.51 (H) 01/13/2018 1653   CREATININE 1.67 (H) 11/07/2016 1507      Component Value Date/Time   CALCIUM 9.3 01/13/2018 1653   ALKPHOS 87 01/13/2018 1653   AST 20 01/13/2018 1653   ALT 33 01/13/2018 1653   BILITOT 0.3 01/13/2018 1653       ASSESSMENT AND PLAN: 1. Essential hypertension Close to goal.  Continue amlodipine at current dose.  2. Dyslipidemia Not at goal.  Increase Pravachol to 40 mg daily. - pravastatin (PRAVACHOL) 40 MG tablet; Take 1 tablet (40 mg total) by mouth daily.  Dispense: 30 tablet; Refill: 6  3. Gastroesophageal reflux disease without esophagitis Ideally would get the most benefit from being on PPI but I am being flagged on an interaction of PPIs with the Celexa.  Can cause prolonged QT interval.  So we will go with Zantac. GERD precautions discussed including foods to avoid, eating at least 2 to 3 hours before laying down and sleeping with the head a little elevated.  If no improvement with Zantac we will use a PPI but will have to change from Celexa to a different antidepressant - ranitidine (ZANTAC) 150 MG capsule; Take 1 capsule (150 mg total) by mouth 2 (two) times daily.  Dispense: 60 capsule; Refill: 6  4. Need for immunization against influenza - Flu Vaccine QUAD 36+ mos IM  5. CKD (chronic kidney disease) stage 3, GFR 30-59 ml/min (HCC) Improved.  Advised to avoid NSAIDs.  Patient was given the opportunity to ask questions.  Patient verbalized understanding of the plan and was able to repeat key elements of the plan.   Orders Placed This Encounter  Procedures  . Flu Vaccine QUAD 36+ mos IM     Requested Prescriptions   Signed  Prescriptions Disp Refills  . pravastatin (PRAVACHOL) 40 MG tablet 30 tablet 6    Sig: Take 1 tablet (40 mg total) by mouth daily.  . ranitidine (ZANTAC) 150 MG capsule 60 capsule 6    Sig: Take 1 capsule (150 mg total) by mouth 2 (two) times daily.    Return in about 4 months (around 08/15/2018).  Karle Plumber, MD, FACP

## 2018-04-15 NOTE — Patient Instructions (Addendum)
Gastroesophageal Reflux Disease, Adult Normally, food travels down the esophagus and stays in the stomach to be digested. If a person has gastroesophageal reflux disease (GERD), food and stomach acid move back up into the esophagus. When this happens, the esophagus becomes sore and swollen (inflamed). Over time, GERD can make small holes (ulcers) in the lining of the esophagus. Follow these instructions at home: Diet  Follow a diet as told by your doctor. You may need to avoid foods and drinks such as: ? Coffee and tea (with or without caffeine). ? Drinks that contain alcohol. ? Energy drinks and sports drinks. ? Carbonated drinks or sodas. ? Chocolate and cocoa. ? Peppermint and mint flavorings. ? Garlic and onions. ? Horseradish. ? Spicy and acidic foods, such as peppers, chili powder, curry powder, vinegar, hot sauces, and BBQ sauce. ? Citrus fruit juices and citrus fruits, such as oranges, lemons, and limes. ? Tomato-based foods, such as red sauce, chili, salsa, and pizza with red sauce. ? Fried and fatty foods, such as donuts, french fries, potato chips, and high-fat dressings. ? High-fat meats, such as hot dogs, rib eye steak, sausage, ham, and bacon. ? High-fat dairy items, such as whole milk, butter, and cream cheese.  Eat small meals often. Avoid eating large meals.  Avoid drinking large amounts of liquid with your meals.  Avoid eating meals during the 2-3 hours before bedtime.  Avoid lying down right after you eat.  Do not exercise right after you eat. General instructions  Pay attention to any changes in your symptoms.  Take over-the-counter and prescription medicines only as told by your doctor. Do not take aspirin, ibuprofen, or other NSAIDs unless your doctor says it is okay.  Do not use any tobacco products, including cigarettes, chewing tobacco, and e-cigarettes. If you need help quitting, ask your doctor.  Wear loose clothes. Do not wear anything tight around  your waist.  Raise (elevate) the head of your bed about 6 inches (15 cm).  Try to lower your stress. If you need help doing this, ask your doctor.  If you are overweight, lose an amount of weight that is healthy for you. Ask your doctor about a safe weight loss goal.  Keep all follow-up visits as told by your doctor. This is important. Contact a doctor if:  You have new symptoms.  You lose weight and you do not know why it is happening.  You have trouble swallowing, or it hurts to swallow.  You have wheezing or a cough that keeps happening.  Your symptoms do not get better with treatment.  You have a hoarse voice. Get help right away if:  You have pain in your arms, neck, jaw, teeth, or back.  You feel sweaty, dizzy, or light-headed.  You have chest pain or shortness of breath.  You throw up (vomit) and your throw up looks like blood or coffee grounds.  You pass out (faint).  Your poop (stool) is bloody or black.  You cannot swallow, drink, or eat. This information is not intended to replace advice given to you by your health care provider. Make sure you discuss any questions you have with your health care provider. Document Released: 01/02/2008 Document Revised: 12/22/2015 Document Reviewed: 11/10/2014 Elsevier Interactive Patient Education  2018 [employer].   Influenza Virus Vaccine injection (Fluarix) What is this medicine? INFLUENZA VIRUS VACCINE (in floo EN zuh VAHY ruhs vak SEEN) helps to reduce the risk of getting influenza also known as the flu. This medicine  may be used for other purposes; ask your health care provider or pharmacist if you have questions. COMMON BRAND NAME(S): Fluarix, Fluzone What should I tell my health care provider before I take this medicine? They need to know if you have any of these conditions: -bleeding disorder like hemophilia -fever or infection -Guillain-Barre syndrome or other neurological problems -immune system  problems -infection with the human immunodeficiency virus (HIV) or AIDS -low blood platelet counts -multiple sclerosis -an unusual or allergic reaction to influenza virus vaccine, eggs, chicken proteins, latex, gentamicin, other medicines, foods, dyes or preservatives -pregnant or trying to get pregnant -breast-feeding How should I use this medicine? This vaccine is for injection into a muscle. It is given by a health care professional. A copy of Vaccine Information Statements will be given before each vaccination. Read this sheet carefully each time. The sheet may change frequently. Talk to your pediatrician regarding the use of this medicine in children. Special care may be needed. Overdosage: If you think you have taken too much of this medicine contact a poison control center or emergency room at once. NOTE: This medicine is only for you. Do not share this medicine with others. What if I miss a dose? This does not apply. What may interact with this medicine? -chemotherapy or radiation therapy -medicines that lower your immune system like etanercept, anakinra, infliximab, and adalimumab -medicines that treat or prevent blood clots like warfarin -phenytoin -steroid medicines like prednisone or cortisone -theophylline -vaccines This list may not describe all possible interactions. Give your health care provider a list of all the medicines, herbs, non-prescription drugs, or dietary supplements you use. Also tell them if you smoke, drink alcohol, or use illegal drugs. Some items may interact with your medicine. What should I watch for while using this medicine? Report any side effects that do not go away within 3 days to your doctor or health care professional. Call your health care provider if any unusual symptoms occur within 6 weeks of receiving this vaccine. You may still catch the flu, but the illness is not usually as bad. You cannot get the flu from the vaccine. The vaccine will not  protect against colds or other illnesses that may cause fever. The vaccine is needed every year. What side effects may I notice from receiving this medicine? Side effects that you should report to your doctor or health care professional as soon as possible: -allergic reactions like skin rash, itching or hives, swelling of the face, lips, or tongue Side effects that usually do not require medical attention (report to your doctor or health care professional if they continue or are bothersome): -fever -headache -muscle aches and pains -pain, tenderness, redness, or swelling at site where injected -weak or tired This list may not describe all possible side effects. Call your doctor for medical advice about side effects. You may report side effects to FDA at 1-800-FDA-1088. Where should I keep my medicine? This vaccine is only given in a clinic, pharmacy, doctor's office, or other health care setting and will not be stored at home. NOTE: This sheet is a summary. It may not cover all possible information. If you have questions about this medicine, talk to your doctor, pharmacist, or health care provider.  2018 Elsevier/Gold Standard (2008-02-11 09:30:40)

## 2018-04-30 ENCOUNTER — Ambulatory Visit (INDEPENDENT_AMBULATORY_CARE_PROVIDER_SITE_OTHER): Payer: BLUE CROSS/BLUE SHIELD | Admitting: Internal Medicine

## 2018-04-30 ENCOUNTER — Encounter: Payer: Self-pay | Admitting: Internal Medicine

## 2018-04-30 VITALS — BP 141/91 | HR 80 | Temp 98.1°F | Ht 66.0 in | Wt 232.0 lb

## 2018-04-30 DIAGNOSIS — N183 Chronic kidney disease, stage 3 unspecified: Secondary | ICD-10-CM

## 2018-04-30 DIAGNOSIS — Z23 Encounter for immunization: Secondary | ICD-10-CM

## 2018-04-30 DIAGNOSIS — B20 Human immunodeficiency virus [HIV] disease: Secondary | ICD-10-CM | POA: Diagnosis not present

## 2018-04-30 DIAGNOSIS — F17200 Nicotine dependence, unspecified, uncomplicated: Secondary | ICD-10-CM | POA: Diagnosis not present

## 2018-04-30 NOTE — Progress Notes (Signed)
Patient Active Problem List   Diagnosis Date Noted  . Human immunodeficiency virus (HIV) disease ([place]) 08/28/2006    Priority: High  . Renal cell carcinoma of right kidney ([place]) 08/19/2013    Priority: Medium  . Essential hypertension 04/28/2008    Priority: Medium  . CKD (chronic kidney disease) stage 3, GFR 30-59 ml/min (HCC) 04/15/2018  . Tobacco dependence 01/13/2018  . Primary osteoarthritis of right knee 04/03/2017  . Dyslipidemia 06/14/2016  . Renal cyst 05/21/2013  . Obesity (BMI 30-39.9) 05/26/2012  . Internal hemorrhoids 12/01/2008  . DEPRESSION, ACUTE 12/20/2006    Patient's Medications  New Prescriptions   No medications on file  Previous Medications   AMLODIPINE (NORVASC) 5 MG TABLET    Take 1 tablet (5 mg total) by mouth daily.   CITALOPRAM (CELEXA) 20 MG TABLET    Take 1 tablet (20 mg total) by mouth daily.   DIPHENHYDRAMINE (BENADRYL) 25 MG CAPSULE    Take 25 mg by mouth every 6 (six) hours as needed for itching or allergies.   HYDROCORTISONE CREAM 0.5 %    Apply 1 application topically daily as needed for itching.   NICOTINE (NICODERM CQ - DOSED IN MG/24 HOURS) 14 MG/24HR PATCH    Place 1 patch (14 mg total) onto the skin daily.   NICOTINE (NICODERM CQ - DOSED IN MG/24 HOURS) 21 MG/24HR PATCH    Place 1 patch (21 mg total) onto the skin daily.   PRAVASTATIN (PRAVACHOL) 40 MG TABLET    Take 1 tablet (40 mg total) by mouth daily.   RANITIDINE (ZANTAC) 150 MG CAPSULE    Take 1 capsule (150 mg total) by mouth 2 (two) times daily.   TRIUMEQ 600-50-300 MG TABLET    TAKE 1 TABLET BY MOUTH EVERY DAY   VITAMIN C (ASCORBIC ACID) 500 MG TABLET    Take 500 mg by mouth at bedtime.   Modified Medications   No medications on file  Discontinued Medications   No medications on file    Subjective: Drew Cuevas is in for his routine HIV follow-up visit.  He has had no problems obtaining, taking or tolerating his Triumeq.  He denies missing any doses.  He is feeling well  but says that he is worried about his numbers.  He is still smoking cigarettes.  He says that his primary care provider put him on nicotine patches but they are not helping.  He says that they seem to fall off easily.  He says he has quit in the past but only when he has been in prison.  Review of Systems: Review of Systems  Constitutional: Negative for chills, diaphoresis, fever, malaise/fatigue and weight loss.  HENT: Negative for sore throat.   Respiratory: Negative for cough, sputum production and shortness of breath.   Cardiovascular: Negative for chest pain.  Gastrointestinal: Negative for abdominal pain, diarrhea, heartburn, nausea and vomiting.  Genitourinary: Negative for dysuria and frequency.  Musculoskeletal: Negative for joint pain and myalgias.  Skin: Negative for rash.  Neurological: Negative for dizziness and headaches.  Psychiatric/Behavioral: Negative for depression and substance abuse. The patient is not nervous/anxious.     Past Medical History:  Diagnosis Date  . Bronchitis    LAST FLARE UP WAS NEW YEARS 2015  . Cancer ([place])    kidney  . GERD (gastroesophageal reflux disease)    NO MEDS  . HIV (human immunodeficiency virus infection) ([place])    UNDER CONTROL WITH MEDICATIONS  .  Hypertension   . Immune deficiency disorder ([place])   . Renal insufficiency 05/17/2015  . Renal mass, right     Social History   Tobacco Use  . Smoking status: Current Every Day Smoker    Packs/day: 0.50    Years: 30.00    Pack years: 15.00    Types: Cigarettes    Last attempt to quit: 11/28/2015    Years since quitting: 2.4  . Smokeless tobacco: Never Used  . Tobacco comment: stopped May 2017  Substance Use Topics  . Alcohol use: No    Comment: stopped 10-12 years ago  . Drug use: No    Family History  Problem Relation Age of Onset  . Hypertension Mother   . Hypertension Sister   . Hypertension Brother       Health Maintenance  Topic Date Due  . FOOT EXAM  07/13/1982   . OPHTHALMOLOGY EXAM  07/13/1982  . URINE MICROALBUMIN  07/13/1982  . HEMOGLOBIN A1C  07/15/2018  . TETANUS/TDAP  07/24/2027  . INFLUENZA VACCINE  Completed  . PNEUMOCOCCAL POLYSACCHARIDE VACCINE AGE 49-64 HIGH RISK  Completed  . HIV Screening  Completed    Objective:  Vitals:   04/30/18 1432  BP: (!) 141/91  Pulse: 80  Temp: 98.1 F (36.7 C)  Weight: 232 lb (105.2 kg)  Height: 5\' 6"  (1.676 m)   Body mass index is 37.45 kg/m.  Physical Exam  Constitutional: He is oriented to person, place, and time.  He is in good spirits.  He is accompanied by his partner, Steele Sizer.  HENT:  Mouth/Throat: No oropharyngeal exudate.  Eyes: Conjunctivae are normal.  Cardiovascular: Normal rate, regular rhythm and normal heart sounds.  No murmur heard. Pulmonary/Chest: Effort normal and breath sounds normal.  Abdominal: Soft. He exhibits no mass. There is no tenderness.  Musculoskeletal: Normal range of motion.  Neurological: He is alert and oriented to person, place, and time.  Skin: No rash noted.    Lab Results Lab Results  Component Value Date   WBC 5.9 01/13/2018   HGB 15.3 01/13/2018   HCT 44.9 01/13/2018   MCV 96 01/13/2018   PLT 234 01/13/2018    Lab Results  Component Value Date   CREATININE 1.51 (H) 01/13/2018   BUN 12 01/13/2018   NA 143 01/13/2018   K 4.6 01/13/2018   CL 109 (H) 01/13/2018   CO2 20 01/13/2018    Lab Results  Component Value Date   ALT 33 01/13/2018   AST 20 01/13/2018   ALKPHOS 87 01/13/2018   BILITOT 0.3 01/13/2018    Lab Results  Component Value Date   CHOL 201 (H) 01/13/2018   HDL 35 (L) 01/13/2018   LDLCALC 147 (H) 01/13/2018   TRIG 96 01/13/2018   CHOLHDL 5.7 (H) 01/13/2018   Lab Results  Component Value Date   LABRPR NON-REACTIVE 04/09/2018   HIV 1 RNA Quant (copies/mL)  Date Value  04/09/2018 42 (H)  03/18/2017 37 (H)  11/07/2016 <20 NOT DETECTED   CD4 T Cell Abs (/uL)  Date Value  04/09/2018 1,130  03/18/2017  750  11/07/2016 740     Problem List Items Addressed This Visit      High   Human immunodeficiency virus (HIV) disease ([place])    He has had a low level viral activation on 3 of his last for viral load but overall his infection remains under good long-term control.  He will continue Triumeq and follow-up after lab work in 1  year.  Received his Prevnar vaccine today.  He has already gotten his influenza vaccine.      Relevant Orders   CBC   T-helper cell (CD4)- ([facility] clinic only)   Comprehensive metabolic panel   Lipid panel   RPR   HIV-1 RNA quant-no reflex-bld     Unprioritized   Tobacco dependence    I encouraged him to continue his attempt to quit smoking.      CKD (chronic kidney disease) stage 3, GFR 30-59 ml/min (HCC)    His CKD is stable.  Triumeq does not have an adverse impact on renal function.           Michel Bickers, MD [facility] [phone] pager   [phone] cell 04/30/2018, 3:02 PM

## 2018-04-30 NOTE — Addendum Note (Signed)
Addended by: Eugenia Mcalpine on: 04/30/2018 03:06 PM   Modules accepted: Orders

## 2018-04-30 NOTE — Assessment & Plan Note (Signed)
I encouraged him to continue his attempt to quit smoking.

## 2018-04-30 NOTE — Assessment & Plan Note (Signed)
His CKD is stable.  Triumeq does not have an adverse impact on renal function.

## 2018-04-30 NOTE — Assessment & Plan Note (Signed)
He has had a low level viral activation on 3 of his last for viral load but overall his infection remains under good long-term control.  He will continue Triumeq and follow-up after lab work in 1 year.  Received his Prevnar vaccine today.  He has already gotten his influenza vaccine.

## 2018-05-05 ENCOUNTER — Other Ambulatory Visit: Payer: Self-pay | Admitting: Internal Medicine

## 2018-05-05 DIAGNOSIS — B20 Human immunodeficiency virus [HIV] disease: Secondary | ICD-10-CM

## 2018-05-21 ENCOUNTER — Other Ambulatory Visit: Payer: Self-pay | Admitting: *Deleted

## 2018-05-21 DIAGNOSIS — B20 Human immunodeficiency virus [HIV] disease: Secondary | ICD-10-CM

## 2018-05-21 MED ORDER — ABACAVIR-DOLUTEGRAVIR-LAMIVUD 600-50-300 MG PO TABS: 1.0000 | ORAL_TABLET | Freq: Every day | ORAL | 11 refills | Status: DC

## 2018-06-21 ENCOUNTER — Emergency Department (HOSPITAL_COMMUNITY): Payer: BLUE CROSS/BLUE SHIELD

## 2018-06-21 ENCOUNTER — Encounter (HOSPITAL_COMMUNITY): Payer: Self-pay | Admitting: Emergency Medicine

## 2018-06-21 ENCOUNTER — Other Ambulatory Visit: Payer: Self-pay

## 2018-06-21 ENCOUNTER — Emergency Department (HOSPITAL_COMMUNITY)
Admission: EM | Admit: 2018-06-21 | Discharge: 2018-06-21 | Disposition: A | Discharge status: Home / Self Care | Payer: BLUE CROSS/BLUE SHIELD | Attending: Emergency Medicine | Admitting: Emergency Medicine

## 2018-06-21 DIAGNOSIS — Z91040 Latex allergy status: Secondary | ICD-10-CM | POA: Diagnosis not present

## 2018-06-21 DIAGNOSIS — F17210 Nicotine dependence, cigarettes, uncomplicated: Secondary | ICD-10-CM | POA: Diagnosis not present

## 2018-06-21 DIAGNOSIS — Z85528 Personal history of other malignant neoplasm of kidney: Secondary | ICD-10-CM | POA: Insufficient documentation

## 2018-06-21 DIAGNOSIS — M25511 Pain in right shoulder: Secondary | ICD-10-CM | POA: Diagnosis present

## 2018-06-21 DIAGNOSIS — I129 Hypertensive chronic kidney disease with stage 1 through stage 4 chronic kidney disease, or unspecified chronic kidney disease: Secondary | ICD-10-CM | POA: Insufficient documentation

## 2018-06-21 DIAGNOSIS — B20 Human immunodeficiency virus [HIV] disease: Secondary | ICD-10-CM | POA: Diagnosis not present

## 2018-06-21 DIAGNOSIS — N183 Chronic kidney disease, stage 3 (moderate): Secondary | ICD-10-CM | POA: Insufficient documentation

## 2018-06-21 DIAGNOSIS — Z79899 Other long term (current) drug therapy: Secondary | ICD-10-CM | POA: Diagnosis not present

## 2018-06-21 MED ORDER — HYDROCODONE-ACETAMINOPHEN 5-325 MG PO TABS: 1.0000 | ORAL_TABLET | Freq: Four times a day (QID) | ORAL | 0 refills | Status: DC | PRN

## 2018-06-21 MED ORDER — METHYLPREDNISOLONE 4 MG PO TBPK: ORAL_TABLET | ORAL | 0 refills | Status: DC

## 2018-06-21 MED ORDER — HYDROCODONE-ACETAMINOPHEN 5-325 MG PO TABS
1.0000 | ORAL_TABLET | Freq: Once | ORAL | Status: AC
  Administered 2018-06-21: 1 via ORAL
  Filled 2018-06-21: qty 1

## 2018-06-21 NOTE — ED Notes (Signed)
Patient transported to X-ray 

## 2018-06-21 NOTE — ED Provider Notes (Signed)
[facility] EMERGENCY DEPARTMENT Provider Note   CSN: [id number] Arrival date & time: 06/21/18  5852     History   Chief Complaint Chief Complaint  Patient presents with  . Shoulder Pain    HPI Drew Cuevas is a 47 y.o. male.  HPI  This is a 47 year old male who presents with right shoulder pain.  Patient reports onset of pain at 1 AM this morning.  He denies any injury.  He reports the pain is worse with range of motion.  He reports limited range of motion.  No history of gout or inflammatory arthritis.  Denies any fevers, redness, swelling.  Rates his pain at 9 out of 10.  He has not taken anything for the pain.  Denies chest pain, shortness of breath.  Past Medical History:  Diagnosis Date  . Bronchitis    LAST FLARE UP WAS NEW YEARS 2015  . Cancer ([place])    kidney  . GERD (gastroesophageal reflux disease)    NO MEDS  . HIV (human immunodeficiency virus infection) ([place])    UNDER CONTROL WITH MEDICATIONS  . Hypertension   . Immune deficiency disorder ([place])   . Renal insufficiency 05/17/2015  . Renal mass, right     Patient Active Problem List   Diagnosis Date Noted  . CKD (chronic kidney disease) stage 3, GFR 30-59 ml/min (HCC) 04/15/2018  . Tobacco dependence 01/13/2018  . Primary osteoarthritis of right knee 04/03/2017  . Dyslipidemia 06/14/2016  . Renal cell carcinoma of right kidney ([place]) 08/19/2013  . Renal cyst 05/21/2013  . Obesity (BMI 30-39.9) 05/26/2012  . Internal hemorrhoids 12/01/2008  . Essential hypertension 04/28/2008  . DEPRESSION, ACUTE 12/20/2006  . Human immunodeficiency virus (HIV) disease ([place]) 08/28/2006    Past Surgical History:  Procedure Laterality Date  . NO PAST SURGERIES    . ROBOT ASSISTED LAPAROSCOPIC NEPHRECTOMY Right 08/19/2013   Procedure: ROBOTIC ASSISTED LAPAROSCOPIC RIGHT PARTIAL NEPHRECTOMY;  Surgeon: Ardis Hughs, MD;  Location: [facility];  Service: Urology;  Laterality: Right;        Home  Medications    Prior to Admission medications   Medication Sig Start Date End Date Taking? Authorizing Provider  abacavir-dolutegravir-lamiVUDine (TRIUMEQ) 600-50-300 MG tablet Take 1 tablet by mouth daily. 05/21/18   Michel Bickers, MD  amLODipine (NORVASC) 5 MG tablet Take 1 tablet (5 mg total) by mouth daily. 01/13/18 02/12/18  Ladell Pier, MD  citalopram (CELEXA) 20 MG tablet Take 1 tablet (20 mg total) by mouth daily. 01/13/18   Ladell Pier, MD  diphenhydrAMINE (BENADRYL) 25 mg capsule Take 25 mg by mouth every 6 (six) hours as needed for itching or allergies.    [provider]  HYDROcodone-acetaminophen (NORCO/VICODIN) 5-325 MG tablet Take 1 tablet by mouth every 6 (six) hours as needed. 06/21/18   , Barbette Hair, MD  hydrocortisone cream 0.5 % Apply 1 application topically daily as needed for itching.    [provider]  methylPREDNISolone (MEDROL DOSEPAK) 4 MG TBPK tablet Take as directed on packet 06/21/18   , Barbette Hair, MD  nicotine (NICODERM CQ - DOSED IN MG/24 HOURS) 14 mg/24hr patch Place 1 patch (14 mg total) onto the skin daily. 01/13/18   Ladell Pier, MD  nicotine (NICODERM CQ - DOSED IN MG/24 HOURS) 21 mg/24hr patch Place 1 patch (21 mg total) onto the skin daily. 01/13/18   Ladell Pier, MD  pravastatin (PRAVACHOL) 40 MG tablet Take 1 tablet (40 mg total)  by mouth daily. 04/15/18   Ladell Pier, MD  ranitidine (ZANTAC) 150 MG capsule Take 1 capsule (150 mg total) by mouth 2 (two) times daily. 04/15/18   Ladell Pier, MD  vitamin C (ASCORBIC ACID) 500 MG tablet Take 500 mg by mouth at bedtime.     [provider]    Family History Family History  Problem Relation Age of Onset  . Hypertension Mother   . Hypertension Sister   . Hypertension Brother     Social History Social History   Tobacco Use  . Smoking status: Current Every Day Smoker    Packs/day: 0.50    Years: 30.00    Pack years: 15.00     Types: Cigarettes    Last attempt to quit: 11/28/2015    Years since quitting: 2.5  . Smokeless tobacco: Never Used  . Tobacco comment: stopped May 2017  Substance Use Topics  . Alcohol use: No    Comment: stopped 10-12 years ago  . Drug use: No     Allergies   Ibuprofen; Penicillins; and Latex   Review of Systems Review of Systems  Constitutional: Negative for fever.  Respiratory: Negative for shortness of breath.   Cardiovascular: Negative for chest pain.  Musculoskeletal:       Shoulder pain  Neurological: Negative for weakness and numbness.  All other systems reviewed and are negative.    Physical Exam Updated Vital Signs BP (!) 132/92   Pulse 86   Resp 18   Ht 1.676 m (5\' 6" )   Wt 104.3 kg   SpO2 100%   BMI 37.12 kg/m   Physical Exam  Constitutional: He is oriented to person, place, and time. He appears well-developed and well-nourished. No distress.  HENT:  Head: Normocephalic and atraumatic.  Eyes: Pupils are equal, round, and reactive to light.  Neck: Normal range of motion.  Cardiovascular: Normal rate, regular rhythm and normal heart sounds.  No murmur heard. Pulmonary/Chest: Effort normal and breath sounds normal. No respiratory distress. He has no wheezes.  Musculoskeletal: He exhibits no edema.  Limited abduction of the right shoulder secondary to pain, tenderness to palpation over the deltoid, no obvious effusion, glenoid appears in place, 2+ radial pulse, neurovascular intact distally, no overlying erythema or skin changes  Neurological: He is alert and oriented to person, place, and time.  Skin: Skin is warm and dry.  Psychiatric: He has a normal mood and affect.  Nursing note and vitals reviewed.    ED Treatments / Results  Labs (all labs ordered are listed, but only abnormal results are displayed) Labs Reviewed - No data to display  EKG None  Radiology Dg Shoulder Right  Result Date: 06/21/2018 CLINICAL DATA:  Right shoulder pain.   No injury. EXAM: RIGHT SHOULDER - 2+ VIEW COMPARISON:  02/04/2012 FINDINGS: There is no evidence of fracture or dislocation. There is no evidence of arthropathy or other focal bone abnormality. Soft tissues are unremarkable. IMPRESSION: Negative. Electronically Signed   By: Lucienne Capers M.D.   On: 06/21/2018 06:17    Procedures Procedures (including critical care time)  Medications Ordered in ED Medications  HYDROcodone-acetaminophen (NORCO/VICODIN) 5-325 MG per tablet 1 tablet (1 tablet Oral Given 06/21/18 0608)     Initial Impression / Assessment and Plan / ED Course  I have reviewed the triage vital signs and the nursing notes.  Pertinent labs & imaging results that were available during my care of the patient were reviewed by me and considered  in my medical decision making (see chart for details).     Patient presents with atraumatic right shoulder pain.  He is overall nontoxic-appearing and vital signs are reassuring.  He is afebrile.  Limited range of motion on exam.  Otherwise, neurovascular intact.  X-rays do not show any evidence of fracture or dislocation.  He has no obvious effusion.  No erythema or suggestion of septic arthritis.  Patient was given pain medication.  He has no history of gout.  Would recommend supportive measures at this time.  He is allergic to ibuprofen.  Will provide with a Medrol Dosepak for inflammation and a short course of Norco.  Patient was given strict return precautions.  After history, exam, and medical workup I feel the patient has been appropriately medically screened and is safe for discharge home. Pertinent diagnoses were discussed with the patient. Patient was given return precautions.   Final Clinical Impressions(s) / ED Diagnoses   Final diagnoses:  Acute pain of right shoulder    ED Discharge Orders         Ordered    methylPREDNISolone (MEDROL DOSEPAK) 4 MG TBPK tablet     06/21/18 0654    HYDROcodone-acetaminophen (NORCO/VICODIN)  5-325 MG tablet  Every 6 hours PRN     06/21/18 0654           Merryl Hacker, MD 06/21/18 [phone]

## 2018-06-21 NOTE — ED Notes (Signed)
Patient verbalizes understanding of discharge instructions. Opportunity for questioning and answers were provided. Armband removed by staff, pt discharged from ED ambulatory.   

## 2018-06-21 NOTE — Discharge Instructions (Addendum)
You were seen today for right shoulder pain.  The cause of your pain at this time is not clear.  It may be related to inflammation.  Take medications as prescribed.  If you develop increasing swelling, redness, fevers, you need to be reevaluated immediately.

## 2018-06-21 NOTE — ED Triage Notes (Signed)
C/O of right sided shoulder pain. Denies injury.

## 2018-08-15 ENCOUNTER — Ambulatory Visit: Payer: BLUE CROSS/BLUE SHIELD | Admitting: Internal Medicine

## 2018-08-15 NOTE — Progress Notes (Deleted)
Patient ID: Drew Cuevas, male    DOB: 06-27-1971  MRN: [id number]  CC: No chief complaint on file.   Subjective: Drew Cuevas is a 48 y.o. male who presents for chronic ds management His concerns today include:  HTN, HL, HIV,OA RT knee, rectal fistulectomy, Renal cell CAs/ppartial nephrology, depression, CKD stage3   Patient Active Problem List   Diagnosis Date Noted  . CKD (chronic kidney disease) stage 3, GFR 30-59 ml/min (HCC) 04/15/2018  . Tobacco dependence 01/13/2018  . Primary osteoarthritis of right knee 04/03/2017  . Dyslipidemia 06/14/2016  . Renal cell carcinoma of right kidney ([place]) 08/19/2013  . Renal cyst 05/21/2013  . Obesity (BMI 30-39.9) 05/26/2012  . Internal hemorrhoids 12/01/2008  . Essential hypertension 04/28/2008  . DEPRESSION, ACUTE 12/20/2006  . Human immunodeficiency virus (HIV) disease ([place]) 08/28/2006     Current Outpatient Medications on File Prior to Visit  Medication Sig Dispense Refill  . abacavir-dolutegravir-lamiVUDine (TRIUMEQ) 600-50-300 MG tablet Take 1 tablet by mouth daily. 30 tablet 11  . amLODipine (NORVASC) 5 MG tablet Take 1 tablet (5 mg total) by mouth daily. 30 tablet 6  . citalopram (CELEXA) 20 MG tablet Take 1 tablet (20 mg total) by mouth daily. 30 tablet 6  . diphenhydrAMINE (BENADRYL) 25 mg capsule Take 25 mg by mouth every 6 (six) hours as needed for itching or allergies.    Marland Kitchen HYDROcodone-acetaminophen (NORCO/VICODIN) 5-325 MG tablet Take 1 tablet by mouth every 6 (six) hours as needed. 6 tablet 0  . hydrocortisone cream 0.5 % Apply 1 application topically daily as needed for itching.    . methylPREDNISolone (MEDROL DOSEPAK) 4 MG TBPK tablet Take as directed on packet 21 tablet 0  . nicotine (NICODERM CQ - DOSED IN MG/24 HOURS) 14 mg/24hr patch Place 1 patch (14 mg total) onto the skin daily. 28 patch 0  . nicotine (NICODERM CQ - DOSED IN MG/24 HOURS) 21 mg/24hr patch Place 1 patch (21 mg total) onto the skin daily. 28  patch 0  . pravastatin (PRAVACHOL) 40 MG tablet Take 1 tablet (40 mg total) by mouth daily. 30 tablet 6  . ranitidine (ZANTAC) 150 MG capsule Take 1 capsule (150 mg total) by mouth 2 (two) times daily. 60 capsule 6  . vitamin C (ASCORBIC ACID) 500 MG tablet Take 500 mg by mouth at bedtime.      No current facility-administered medications on file prior to visit.     Allergies  Allergen Reactions  . Ibuprofen Anaphylaxis, Swelling and Other (See Comments)    Dehydration Swelling of the "moist areas" (throat, mouth)  . Penicillins Hives    Has patient had a PCN reaction causing immediate rash, facial/tongue/throat swelling, SOB or lightheadedness with hypotension: {yes} Has patient had a PCN reaction causing severe rash involving mucus membranes or skin necrosis: {no-- pt had hives} Has patient had a PCN reaction that required hospitalization {no} Has patient had a PCN reaction occurring within the last 10 years: {no} If all of the above answers are "NO", then may proceed with Cephalosporin use.   . Latex Rash    Social History   Socioeconomic History  . Marital status: Single    Spouse name: Not on file  . Number of children: Not on file  . Years of education: Not on file  . Highest education level: Not on file  Occupational History  . Not on file  Social Needs  . Financial resource strain: Not on file  . Food insecurity:  Worry: Not on file    Inability: Not on file  . Transportation needs:    Medical: Not on file    Non-medical: Not on file  Tobacco Use  . Smoking status: Current Every Day Smoker    Packs/day: 0.50    Years: 30.00    Pack years: 15.00    Types: Cigarettes    Last attempt to quit: 11/28/2015    Years since quitting: 2.7  . Smokeless tobacco: Never Used  . Tobacco comment: stopped May 2017  Substance and Sexual Activity  . Alcohol use: No    Comment: stopped 10-12 years ago  . Drug use: No  . Sexual activity: Not Currently    Partners: Male     Comment: declined condoms  Lifestyle  . Physical activity:    Days per week: Not on file    Minutes per session: Not on file  . Stress: Not on file  Relationships  . Social connections:    Talks on phone: Not on file    Gets together: Not on file    Attends religious service: Not on file    Active member of club or organization: Not on file    Attends meetings of clubs or organizations: Not on file    Relationship status: Not on file  . Intimate partner violence:    Fear of current or ex partner: Not on file    Emotionally abused: Not on file    Physically abused: Not on file    Forced sexual activity: Not on file  Other Topics Concern  . Not on file  Social History Narrative  . Not on file    Family History  Problem Relation Age of Onset  . Hypertension Mother   . Hypertension Sister   . Hypertension Brother     Past Surgical History:  Procedure Laterality Date  . NO PAST SURGERIES    . ROBOT ASSISTED LAPAROSCOPIC NEPHRECTOMY Right 08/19/2013   Procedure: ROBOTIC ASSISTED LAPAROSCOPIC RIGHT PARTIAL NEPHRECTOMY;  Surgeon: Ardis Hughs, MD;  Location: [facility];  Service: Urology;  Laterality: Right;    ROS: Review of Systems  PHYSICAL EXAM: There were no vitals taken for this visit.  Physical Exam  {male adult master:310786} {male adult master:310785}   ASSESSMENT AND PLAN:  There are no diagnoses linked to this encounter.   Patient was given the opportunity to ask questions.  Patient verbalized understanding of the plan and was able to repeat key elements of the plan.   No orders of the defined types were placed in this encounter.    Requested Prescriptions    No prescriptions requested or ordered in this encounter    No follow-ups on file.  Karle Plumber, MD, FACP

## 2018-09-02 ENCOUNTER — Other Ambulatory Visit: Payer: Self-pay | Admitting: Internal Medicine

## 2018-09-02 DIAGNOSIS — I10 Essential (primary) hypertension: Secondary | ICD-10-CM

## 2018-09-29 ENCOUNTER — Other Ambulatory Visit: Payer: Self-pay | Admitting: Internal Medicine

## 2018-09-29 DIAGNOSIS — I10 Essential (primary) hypertension: Secondary | ICD-10-CM

## 2018-10-18 ENCOUNTER — Other Ambulatory Visit: Payer: Self-pay | Admitting: Internal Medicine

## 2018-10-18 DIAGNOSIS — I10 Essential (primary) hypertension: Secondary | ICD-10-CM

## 2018-11-11 ENCOUNTER — Other Ambulatory Visit: Payer: Self-pay | Admitting: Nurse Practitioner

## 2018-11-11 DIAGNOSIS — F329 Major depressive disorder, single episode, unspecified: Secondary | ICD-10-CM

## 2018-11-11 DIAGNOSIS — F32A Depression, unspecified: Secondary | ICD-10-CM

## 2018-12-06 ENCOUNTER — Other Ambulatory Visit: Payer: Self-pay | Admitting: Internal Medicine

## 2018-12-06 DIAGNOSIS — E785 Hyperlipidemia, unspecified: Secondary | ICD-10-CM

## 2019-01-06 ENCOUNTER — Other Ambulatory Visit: Payer: Self-pay | Admitting: Internal Medicine

## 2019-01-06 DIAGNOSIS — E785 Hyperlipidemia, unspecified: Secondary | ICD-10-CM

## 2019-01-11 ENCOUNTER — Other Ambulatory Visit: Payer: Self-pay | Admitting: Internal Medicine

## 2019-01-11 DIAGNOSIS — I10 Essential (primary) hypertension: Secondary | ICD-10-CM

## 2019-02-26 ENCOUNTER — Other Ambulatory Visit: Payer: Self-pay | Admitting: Internal Medicine

## 2019-02-26 DIAGNOSIS — F32A Depression, unspecified: Secondary | ICD-10-CM

## 2019-02-26 DIAGNOSIS — F329 Major depressive disorder, single episode, unspecified: Secondary | ICD-10-CM

## 2019-03-21 ENCOUNTER — Other Ambulatory Visit: Payer: Self-pay | Admitting: Internal Medicine

## 2019-03-21 DIAGNOSIS — I10 Essential (primary) hypertension: Secondary | ICD-10-CM

## 2019-04-01 ENCOUNTER — Other Ambulatory Visit: Payer: BLUE CROSS/BLUE SHIELD

## 2019-04-22 ENCOUNTER — Encounter: Payer: BLUE CROSS/BLUE SHIELD | Admitting: Internal Medicine

## 2019-04-23 ENCOUNTER — Other Ambulatory Visit: Payer: Self-pay | Admitting: Internal Medicine

## 2019-04-23 ENCOUNTER — Telehealth: Payer: Self-pay | Admitting: Internal Medicine

## 2019-04-23 DIAGNOSIS — F329 Major depressive disorder, single episode, unspecified: Secondary | ICD-10-CM

## 2019-04-23 DIAGNOSIS — I10 Essential (primary) hypertension: Secondary | ICD-10-CM

## 2019-04-23 DIAGNOSIS — F32A Depression, unspecified: Secondary | ICD-10-CM

## 2019-04-23 MED ORDER — AMLODIPINE BESYLATE 5 MG PO TABS: ORAL_TABLET | ORAL | 0 refills | Status: DC

## 2019-04-23 NOTE — Telephone Encounter (Signed)
Pt came in to request his BP medication refilled  -amLODipine (NORVASC) 5 MG tablet  Pt was made aware that he has not been seen by a doctor in over a year. An appointment was scheduled to re-establish care and would like enough medication until his appt-05/18/2019, please follow up if possible to -[place], [place]

## 2019-05-12 NOTE — Telephone Encounter (Signed)
Patient received courtesy fill and has appointment scheduled 10/19.

## 2019-05-18 ENCOUNTER — Telehealth: Payer: Self-pay | Admitting: Internal Medicine

## 2019-05-18 ENCOUNTER — Ambulatory Visit: Payer: BLUE CROSS/BLUE SHIELD | Admitting: Internal Medicine

## 2019-05-18 DIAGNOSIS — I10 Essential (primary) hypertension: Secondary | ICD-10-CM

## 2019-05-18 NOTE — Telephone Encounter (Signed)
1) Medication(s) Requested (by name): -amLODipine (NORVASC) 5 MG tablet   2) Pharmacy of Choice: [facility] [id number] - [place], [address] AT [address]) Special Requests: Until next appt 06/19/2019

## 2019-05-19 ENCOUNTER — Other Ambulatory Visit: Payer: Self-pay

## 2019-05-19 ENCOUNTER — Ambulatory Visit (INDEPENDENT_AMBULATORY_CARE_PROVIDER_SITE_OTHER): Payer: BLUE CROSS/BLUE SHIELD

## 2019-05-19 ENCOUNTER — Other Ambulatory Visit: Payer: BLUE CROSS/BLUE SHIELD

## 2019-05-19 DIAGNOSIS — Z23 Encounter for immunization: Secondary | ICD-10-CM | POA: Diagnosis not present

## 2019-05-19 DIAGNOSIS — Z113 Encounter for screening for infections with a predominantly sexual mode of transmission: Secondary | ICD-10-CM

## 2019-05-19 DIAGNOSIS — B20 Human immunodeficiency virus [HIV] disease: Secondary | ICD-10-CM

## 2019-05-19 DIAGNOSIS — Z79899 Other long term (current) drug therapy: Secondary | ICD-10-CM

## 2019-05-19 MED ORDER — AMLODIPINE BESYLATE 5 MG PO TABS: ORAL_TABLET | ORAL | 0 refills | Status: DC

## 2019-05-19 NOTE — Telephone Encounter (Signed)
Per pt's PCP, he was instructed to make yesterday's appointment to continue receiving refills of amlodipine. He did not make that appointment but is scheduled for 06/19/19. Will provide last fill to hold him until that appointment.

## 2019-05-20 LAB — T-HELPER CELL (CD4) - (RCID CLINIC ONLY)
CD4 % Helper T Cell: 31 % — ABNORMAL LOW (ref 33–65)
CD4 T Cell Abs: 953 /uL (ref 400–1790)

## 2019-05-22 LAB — LIPID PANEL
Cholesterol: 235 mg/dL — ABNORMAL HIGH (ref <200)
HDL: 26 mg/dL — ABNORMAL LOW (ref >=40)
Non-HDL Cholesterol (Calc): 209 mg/dL (calc) — ABNORMAL HIGH (ref <130)
Total CHOL/HDL Ratio: 9 (calc) — ABNORMAL HIGH (ref <5.0)
Triglycerides: 405 mg/dL — ABNORMAL HIGH (ref <150)

## 2019-05-22 LAB — CBC WITH DIFFERENTIAL/PLATELET
Absolute Monocytes: 708 cells/uL (ref 200–950)
Basophils Absolute: 83 cells/uL (ref 0–200)
Basophils Relative: 0.9 %
Eosinophils Absolute: 258 cells/uL (ref 15–500)
Eosinophils Relative: 2.8 %
HCT: 43.7 % (ref 38.5–50.0)
Hemoglobin: 15.1 g/dL (ref 13.2–17.1)
Lymphs Abs: 3340 cells/uL (ref 850–3900)
MCH: 33.2 pg — ABNORMAL HIGH (ref 27.0–33.0)
MCHC: 34.6 g/dL (ref 32.0–36.0)
MCV: 96 fL (ref 80.0–100.0)
MPV: 12.1 fL (ref 7.5–12.5)
Monocytes Relative: 7.7 %
Neutro Abs: 4812 cells/uL (ref 1500–7800)
Neutrophils Relative %: 52.3 %
Platelets: 232 10*3/uL (ref 140–400)
RBC: 4.55 10*6/uL (ref 4.20–5.80)
RDW: 14 % (ref 11.0–15.0)
Total Lymphocyte: 36.3 %
WBC: 9.2 10*3/uL (ref 3.8–10.8)

## 2019-05-22 LAB — COMPREHENSIVE METABOLIC PANEL
AG Ratio: 1.6 (calc) (ref 1.0–2.5)
ALT: 13 U/L (ref 9–46)
AST: 13 U/L (ref 10–40)
Albumin: 3.9 g/dL (ref 3.6–5.1)
Alkaline phosphatase (APISO): 79 U/L (ref 36–130)
BUN/Creatinine Ratio: 6 (calc) (ref 6–22)
BUN: 11 mg/dL (ref 7–25)
CO2: 22 mmol/L (ref 20–32)
Calcium: 8.9 mg/dL (ref 8.6–10.3)
Chloride: 108 mmol/L (ref 98–110)
Creat: 1.73 mg/dL — ABNORMAL HIGH (ref 0.60–1.35)
Globulin: 2.4 g/dL (calc) (ref 1.9–3.7)
Glucose, Bld: 66 mg/dL (ref 65–99)
Potassium: 3.9 mmol/L (ref 3.5–5.3)
Sodium: 141 mmol/L (ref 135–146)
Total Bilirubin: 0.3 mg/dL (ref 0.2–1.2)
Total Protein: 6.3 g/dL (ref 6.1–8.1)

## 2019-05-22 LAB — RPR: RPR Ser Ql: NONREACTIVE

## 2019-05-22 LAB — HIV-1 RNA QUANT-NO REFLEX-BLD
HIV 1 RNA Quant: <20 copies/mL — AB
HIV-1 RNA Quant, Log: <1.3 Log copies/mL — AB

## 2019-05-25 ENCOUNTER — Other Ambulatory Visit: Payer: Self-pay | Admitting: *Deleted

## 2019-05-25 DIAGNOSIS — B20 Human immunodeficiency virus [HIV] disease: Secondary | ICD-10-CM

## 2019-05-25 MED ORDER — TRIUMEQ 600-50-300 MG PO TABS: 1.0000 | ORAL_TABLET | Freq: Every day | ORAL | 11 refills | Status: DC

## 2019-06-02 ENCOUNTER — Encounter: Payer: BLUE CROSS/BLUE SHIELD | Admitting: Internal Medicine

## 2019-06-19 ENCOUNTER — Other Ambulatory Visit: Payer: Self-pay

## 2019-06-19 ENCOUNTER — Ambulatory Visit: Payer: BLUE CROSS/BLUE SHIELD | Attending: Internal Medicine | Admitting: Internal Medicine

## 2019-06-19 DIAGNOSIS — E785 Hyperlipidemia, unspecified: Secondary | ICD-10-CM | POA: Diagnosis not present

## 2019-06-19 DIAGNOSIS — F32A Depression, unspecified: Secondary | ICD-10-CM

## 2019-06-19 DIAGNOSIS — G4709 Other insomnia: Secondary | ICD-10-CM

## 2019-06-19 DIAGNOSIS — I10 Essential (primary) hypertension: Secondary | ICD-10-CM | POA: Diagnosis not present

## 2019-06-19 DIAGNOSIS — B20 Human immunodeficiency virus [HIV] disease: Secondary | ICD-10-CM

## 2019-06-19 DIAGNOSIS — F17200 Nicotine dependence, unspecified, uncomplicated: Secondary | ICD-10-CM

## 2019-06-19 DIAGNOSIS — F329 Major depressive disorder, single episode, unspecified: Secondary | ICD-10-CM

## 2019-06-19 DIAGNOSIS — Z21 Asymptomatic human immunodeficiency virus [HIV] infection status: Secondary | ICD-10-CM

## 2019-06-19 DIAGNOSIS — C649 Malignant neoplasm of unspecified kidney, except renal pelvis: Secondary | ICD-10-CM

## 2019-06-19 MED ORDER — ESCITALOPRAM OXALATE 10 MG PO TABS: 10.0000 mg | ORAL_TABLET | Freq: Every day | ORAL | 3 refills | Status: DC

## 2019-06-19 MED ORDER — TRAZODONE HCL 50 MG PO TABS: 25.0000 mg | ORAL_TABLET | Freq: Every day | ORAL | 3 refills | Status: DC

## 2019-06-19 MED ORDER — LOVASTATIN 40 MG PO TABS: 40.0000 mg | ORAL_TABLET | Freq: Every day | ORAL | 3 refills | Status: DC

## 2019-06-19 MED ORDER — AMLODIPINE BESYLATE 5 MG PO TABS: ORAL_TABLET | ORAL | 6 refills | Status: DC

## 2019-06-19 NOTE — Progress Notes (Signed)
Virtual Visit via Telephone Note Due to current restrictions/limitations of in-office visits due to the COVID-19 pandemic, this scheduled clinical appointment was converted to a telehealth visit  I connected with Drew Cuevas on 06/19/19 at 12:19 p.m by telephone and verified that I am speaking with the correct person using two identifiers. I am in my office.  The patient is at home.  Only the patient and myself participated in this encounter.  I discussed the limitations, risks, security and privacy concerns of performing an evaluation and management service by telephone and the availability of in person appointments. I also discussed with the patient that there may be a patient responsible charge related to this service. The patient expressed understanding and agreed to proceed.   History of Present Illness: HTN, HL, HIV,OA RT knee, rectal fistulectomy, Renal cell CAs/ppartial nephrology, depression, CKD stage3.  Pt last seen 03/2018.  Purpose of today's visit is chronic disease management.  HL:  Not able to afford the Pravachol even with his insurance.  Requests change to a different medication.  HTN:  Takes Norvasc.  Has device but does not check blood pressure No CP/SOB/HA/dizziness  HIV:  Has f/u with Megan Salon later this mth.  Reports compliance with medication.  Depression: stopped taking citalopram 2 mths because it makes him feels spaced out.  Feels he still needs to be on med for depression.  No SI  Tob dep:  Down to 2 cig/day. Wants to quit. Patches were helpful but came off his skin easily.  Not wanting to try the patches again.  Feels he can quit on his own.  Renal Cell CA:  Had surgery in 2015.  Had f/u with [facility] for a while then told he did not need any further f/u.  I see the last CT of the abdomen was done back in 2017.  He has had several chest x-ray done that were okay.  Wanting sleep aid.  Problems sleeping for 1 yr.  Problems with sleep initiate and  maintenance.  Gets in bed around 10 p.m and "toss and turn for hrs."  Lights and sounds off.  Does not drink any caffeinated beverages at bedtime.  No excessive alcohol at bedtime.  Outpatient Encounter Medications as of 06/19/2019  Medication Sig  . abacavir-dolutegravir-lamiVUDine (TRIUMEQ) 600-50-300 MG tablet Take 1 tablet by mouth daily. (Patient not taking: Reported on 06/19/2019)  . amLODipine (NORVASC) 5 MG tablet TAKE 1 TABLET(5 MG) BY MOUTH DAILY. Must keep upcoming office visit for refills. LAST FILL.  . citalopram (CELEXA) 20 MG tablet TAKE 1 TABLET BY MOUTH DAILY (Patient not taking: Reported on 06/19/2019)  . diphenhydrAMINE (BENADRYL) 25 mg capsule Take 25 mg by mouth every 6 (six) hours as needed for itching or allergies.  Marland Kitchen HYDROcodone-acetaminophen (NORCO/VICODIN) 5-325 MG tablet Take 1 tablet by mouth every 6 (six) hours as needed. (Patient not taking: Reported on 06/19/2019)  . hydrocortisone cream 0.5 % Apply 1 application topically daily as needed for itching.  . methylPREDNISolone (MEDROL DOSEPAK) 4 MG TBPK tablet Take as directed on packet (Patient not taking: Reported on 06/19/2019)  . nicotine (NICODERM CQ - DOSED IN MG/24 HOURS) 14 mg/24hr patch Place 1 patch (14 mg total) onto the skin daily. (Patient not taking: Reported on 06/19/2019)  . nicotine (NICODERM CQ - DOSED IN MG/24 HOURS) 21 mg/24hr patch Place 1 patch (21 mg total) onto the skin daily. (Patient not taking: Reported on 06/19/2019)  . pravastatin (PRAVACHOL) 40 MG tablet Take 1 tablet (40 mg  total) by mouth daily. Needs appt for refills. (Patient not taking: Reported on 06/19/2019)  . ranitidine (ZANTAC) 150 MG capsule Take 1 capsule (150 mg total) by mouth 2 (two) times daily. (Patient not taking: Reported on 06/19/2019)  . vitamin C (ASCORBIC ACID) 500 MG tablet Take 500 mg by mouth at bedtime.    No facility-administered encounter medications on file as of 06/19/2019.       Observations/Objective: No  direct observation done as this was a telephone encounter.  Lab Results  Component Value Date   WBC 9.2 05/19/2019   HGB 15.1 05/19/2019   HCT 43.7 05/19/2019   MCV 96.0 05/19/2019   PLT 232 05/19/2019     Chemistry      Component Value Date/Time   NA 141 05/19/2019 1341   NA 143 01/13/2018 1653   K 3.9 05/19/2019 1341   CL 108 05/19/2019 1341   CO2 22 05/19/2019 1341   BUN 11 05/19/2019 1341   BUN 12 01/13/2018 1653   CREATININE 1.73 (H) 05/19/2019 1341      Component Value Date/Time   CALCIUM 8.9 05/19/2019 1341   ALKPHOS 87 01/13/2018 1653   AST 13 05/19/2019 1341   ALT 13 05/19/2019 1341   BILITOT 0.3 05/19/2019 1341   BILITOT 0.3 01/13/2018 1653       Assessment and Plan: 1. Essential hypertension Advised to check blood pressure at least twice a week with goal being 130/80 or lower.  Continue low-salt diet. - amLODipine (NORVASC) 5 MG tablet; TAKE 1 TABLET(5 MG) BY MOUTH DAILY.  Dispense: 30 tablet; Refill: 6  2. Dyslipidemia Change Pravachol to lovastatin which should have a lower co-pay. - lovastatin (MEVACOR) 40 MG tablet; Take 1 tablet (40 mg total) by mouth at bedtime.  Dispense: 90 tablet; Refill: 3  3. Depression, unspecified depression type We will try him with Lexapro.  If any increased depression or suicidal ideation on the medication patient told to stop the medication and come in. - escitalopram (LEXAPRO) 10 MG tablet; Take 1 tablet (10 mg total) by mouth daily.  Dispense: 30 tablet; Refill: 3  4. Tobacco dependence Advised to quit.  Discussed health risks associated with smoking.  Commended him on how he has cut back.  Encouraged him to set a quit date.  Less than 5 minutes spent on counseling.  5. Human immunodeficiency virus (HIV) disease ([place]) Keep upcoming appointment with ID.  6. Renal cell carcinoma, unspecified laterality [facility]) Patient was followed by urology.  He did not have any recurrence.  Denies any symptoms of hematuria or flank pain  at this time.  7. Other insomnia Good sleep hygiene discussed including getting in bed around about the same time every night, turning off the lights and sound.  If unable to fall asleep after being in bed for 45 minutes he should get up and do something until his eyes feel heavy and then try to get back in bed.  Avoid excessive alcohol use around bedtime.  Avoid drinking caffeinated beverages within 4 hours of bedtime. - traZODone (DESYREL) 50 MG tablet; Take 0.5 tablets (25 mg total) by mouth at bedtime.  Dispense: 15 tablet; Refill: 3   Follow Up Instructions: 3-4 mths   I discussed the assessment and treatment plan with the patient. The patient was provided an opportunity to ask questions and all were answered. The patient agreed with the plan and demonstrated an understanding of the instructions.   The patient was advised to call back or seek  an in-person evaluation if the symptoms worsen or if the condition fails to improve as anticipated.  I provided 15 minutes of non-face-to-face time during this encounter.   Karle Plumber, MD

## 2019-06-23 ENCOUNTER — Encounter: Payer: Self-pay | Admitting: Internal Medicine

## 2019-06-23 ENCOUNTER — Other Ambulatory Visit: Payer: Self-pay

## 2019-06-23 ENCOUNTER — Ambulatory Visit (INDEPENDENT_AMBULATORY_CARE_PROVIDER_SITE_OTHER): Payer: BLUE CROSS/BLUE SHIELD | Admitting: Internal Medicine

## 2019-06-23 DIAGNOSIS — F17200 Nicotine dependence, unspecified, uncomplicated: Secondary | ICD-10-CM

## 2019-06-23 DIAGNOSIS — E669 Obesity, unspecified: Secondary | ICD-10-CM | POA: Diagnosis not present

## 2019-06-23 DIAGNOSIS — N1831 Chronic kidney disease, stage 3a: Secondary | ICD-10-CM

## 2019-06-23 DIAGNOSIS — B20 Human immunodeficiency virus [HIV] disease: Secondary | ICD-10-CM

## 2019-06-23 DIAGNOSIS — F322 Major depressive disorder, single episode, severe without psychotic features: Secondary | ICD-10-CM

## 2019-06-23 DIAGNOSIS — I10 Essential (primary) hypertension: Secondary | ICD-10-CM | POA: Diagnosis not present

## 2019-06-24 NOTE — Progress Notes (Signed)
Patient Active Problem List   Diagnosis Date Noted  . Human immunodeficiency virus (HIV) disease ([place]) 08/28/2006    Priority: High  . Renal cell carcinoma of right kidney ([place]) 08/19/2013    Priority: Medium  . Essential hypertension 04/28/2008    Priority: Medium  . Depression 06/19/2019  . Other insomnia 06/19/2019  . CKD (chronic kidney disease) stage 3, GFR 30-59 ml/min 04/15/2018  . Tobacco dependence 01/13/2018  . Primary osteoarthritis of right knee 04/03/2017  . Dyslipidemia 06/14/2016  . Renal cyst 05/21/2013  . Obesity (BMI 30-39.9) 05/26/2012  . Internal hemorrhoids 12/01/2008  . Major depressive disorder, single episode, severe ([place]) 12/20/2006    Patient's Medications  New Prescriptions   No medications on file  Previous Medications   ABACAVIR-DOLUTEGRAVIR-LAMIVUDINE (TRIUMEQ) 600-50-300 MG TABLET    Take 1 tablet by mouth daily.   AMLODIPINE (NORVASC) 5 MG TABLET    TAKE 1 TABLET(5 MG) BY MOUTH DAILY.   DIPHENHYDRAMINE (BENADRYL) 25 MG CAPSULE    Take 25 mg by mouth every 6 (six) hours as needed for itching or allergies.   ESCITALOPRAM (LEXAPRO) 10 MG TABLET    Take 1 tablet (10 mg total) by mouth daily.   HYDROCORTISONE CREAM 0.5 %    Apply 1 application topically daily as needed for itching.   LOVASTATIN (MEVACOR) 40 MG TABLET    Take 1 tablet (40 mg total) by mouth at bedtime.   TRAZODONE (DESYREL) 50 MG TABLET    Take 0.5 tablets (25 mg total) by mouth at bedtime.   VITAMIN C (ASCORBIC ACID) 500 MG TABLET    Take 500 mg by mouth at bedtime.   Modified Medications   No medications on file  Discontinued Medications   No medications on file    Subjective: [place] is in for his routine HIV follow-up visit.  He has had no problems obtaining, taking or tolerating his Triumeq.  He does not recall missing any doses.  He recently developed a "head cold" 4 days ago.  He has some sinus congestion and cough productive of white sputum.  He has not had any  fever, shortness of breath, or loss of his senses of taste or smell.  He is starting to feel better and plans to return to work soon.  He tells me that everyone he works with has been wearing a facemask during the Covid pandemic.  He is down to 2 to 3 cigarettes daily.  He and his partner are considering quitting but have not set a quit date.  He has already received his influenza vaccine.  He was recently started on lovastatin 20 mg daily.  He is not on any other new medications.  Review of Systems: Review of Systems  Constitutional: Negative for chills, diaphoresis, fever, malaise/fatigue and weight loss.  HENT: Positive for congestion. Negative for sore throat.   Respiratory: Positive for cough and sputum production. Negative for shortness of breath.   Cardiovascular: Negative for chest pain.  Gastrointestinal: Negative for abdominal pain, diarrhea, heartburn, nausea and vomiting.  Genitourinary: Negative for dysuria and frequency.  Musculoskeletal: Negative for joint pain and myalgias.  Skin: Negative for rash.  Neurological: Negative for dizziness and headaches.  Psychiatric/Behavioral: Negative for depression and substance abuse. The patient is not nervous/anxious.     Past Medical History:  Diagnosis Date  . Bronchitis    LAST FLARE UP WAS NEW YEARS 2015  . Cancer ([place])    kidney  .  GERD (gastroesophageal reflux disease)    NO MEDS  . HIV (human immunodeficiency virus infection) ([place])    UNDER CONTROL WITH MEDICATIONS  . Hypertension   . Immune deficiency disorder ([place])   . Renal insufficiency 05/17/2015  . Renal mass, right     Social History   Tobacco Use  . Smoking status: Current Every Day Smoker    Packs/day: 0.50    Years: 30.00    Pack years: 15.00    Types: Cigarettes    Last attempt to quit: 11/28/2015    Years since quitting: 3.5  . Smokeless tobacco: Never Used  . Tobacco comment: stopped May 2017  Substance Use Topics  . Alcohol use: No    Comment:  stopped 10-12 years ago  . Drug use: No    Family History  Problem Relation Age of Onset  . Hypertension Mother   . Hypertension Sister   . Hypertension Brother       Health Maintenance  Topic Date Due  . FOOT EXAM  07/13/1982  . OPHTHALMOLOGY EXAM  07/13/1982  . URINE MICROALBUMIN  07/13/1982  . HEMOGLOBIN A1C  07/15/2018  . TETANUS/TDAP  07/24/2027  . INFLUENZA VACCINE  Completed  . PNEUMOCOCCAL POLYSACCHARIDE VACCINE AGE 46-64 HIGH RISK  Completed  . HIV Screening  Completed    Objective:  Vitals:   06/23/19 1550  BP: 124/76  Pulse: 91  Temp: 98.2 F (36.8 C)  TempSrc: Oral  Weight: 219 lb 12.8 oz (99.7 kg)   Body mass index is 35.48 kg/m.  Physical Exam Constitutional:      Comments: He is in good spirits.  HENT:     Mouth/Throat:     Pharynx: No oropharyngeal exudate.  Eyes:     Conjunctiva/sclera: Conjunctivae normal.  Cardiovascular:     Rate and Rhythm: Normal rate and regular rhythm.     Heart sounds: No murmur.  Pulmonary:     Effort: Pulmonary effort is normal.     Breath sounds: Normal breath sounds.  Abdominal:     Palpations: Abdomen is soft. There is no mass.     Tenderness: There is no abdominal tenderness.  Musculoskeletal: Normal range of motion.  Skin:    Findings: No rash.  Neurological:     Mental Status: He is alert and oriented to person, place, and time.  Psychiatric:        Mood and Affect: Mood normal.     Lab Results Lab Results  Component Value Date   WBC 9.2 05/19/2019   HGB 15.1 05/19/2019   HCT 43.7 05/19/2019   MCV 96.0 05/19/2019   PLT 232 05/19/2019    Lab Results  Component Value Date   CREATININE 1.73 (H) 05/19/2019   BUN 11 05/19/2019   NA 141 05/19/2019   K 3.9 05/19/2019   CL 108 05/19/2019   CO2 22 05/19/2019    Lab Results  Component Value Date   ALT 13 05/19/2019   AST 13 05/19/2019   ALKPHOS 87 01/13/2018   BILITOT 0.3 05/19/2019    Lab Results  Component Value Date   CHOL 235 (H)  05/19/2019   HDL 26 (L) 05/19/2019   [place]  05/19/2019     Comment:     . LDL cholesterol not calculated. Triglyceride levels greater than 400 mg/dL invalidate calculated LDL results. . Reference range: <100 . Desirable range <100 mg/dL for primary prevention;   <70 mg/dL for patients with CHD or diabetic patients  with > or =  2 CHD risk factors. Marland Kitchen LDL-C is now calculated using the Martin-Hopkins  calculation, which is a validated novel method providing  better accuracy than the Friedewald equation in the  estimation of LDL-C.  Cresenciano Genre et al. Annamaria Helling. [id number]): 2061-2068  (http://education.[employer].com/faq/FAQ164)    TRIG 405 (H) 05/19/2019   CHOLHDL 9.0 (H) 05/19/2019   Lab Results  Component Value Date   LABRPR NON-REACTIVE 05/19/2019   HIV 1 RNA Quant (copies/mL)  Date Value  05/19/2019 <20 DETECTED (A)  04/09/2018 42 (H)  03/18/2017 37 (H)   CD4 T Cell Abs (/uL)  Date Value  05/19/2019 953  04/09/2018 1,130  03/18/2017 750     Problem List Items Addressed This Visit      High   Human immunodeficiency virus (HIV) disease ([place])    His infection remains under excellent, long-term control.  He will continue Triumeq and follow-up after lab work in 1 year.      Relevant Orders   1 Year CBC   1 Year CD4   1 Year CMP   1 Year Lipid panel   1 Year RPR   1 Year VL     Medium   Essential hypertension    His blood pressure is at goal.        Unprioritized   Tobacco dependence    I asked him to set a quit date in the near future.      Obesity (BMI 30-39.9)    He is doing a good job of gradually and sustainably losing weight.      Major depressive disorder, single episode, severe ([place])    His depression is in remission.      CKD (chronic kidney disease) stage 3, GFR 30-59 ml/min    His chronic kidney disease is stable.  Triumeq has no adverse impact on renal function.           Michel Bickers, MD [facility] [phone] pager   [phone] cell 06/24/2019, 9:34 AM

## 2019-06-24 NOTE — Assessment & Plan Note (Signed)
I asked him to set a quit date in the near future.

## 2019-06-24 NOTE — Assessment & Plan Note (Signed)
He is doing a good job of gradually and sustainably losing weight.

## 2019-06-24 NOTE — Assessment & Plan Note (Signed)
His chronic kidney disease is stable.  Triumeq has no adverse impact on renal function.

## 2019-06-24 NOTE — Assessment & Plan Note (Signed)
His depression is in remission. 

## 2019-06-24 NOTE — Assessment & Plan Note (Signed)
His infection remains under excellent, long-term control.  He will continue Triumeq and follow-up after lab work in 1 year.

## 2019-06-24 NOTE — Assessment & Plan Note (Signed)
His blood pressure is at goal.

## 2019-08-19 ENCOUNTER — Ambulatory Visit (HOSPITAL_COMMUNITY)
Admission: EM | Admit: 2019-08-19 | Discharge: 2019-08-19 | Disposition: A | Discharge status: Home / Self Care | Payer: BLUE CROSS/BLUE SHIELD | Attending: Family Medicine | Admitting: Family Medicine

## 2019-08-19 ENCOUNTER — Encounter (HOSPITAL_COMMUNITY): Payer: Self-pay

## 2019-08-19 ENCOUNTER — Other Ambulatory Visit: Payer: Self-pay

## 2019-08-19 DIAGNOSIS — K0889 Other specified disorders of teeth and supporting structures: Secondary | ICD-10-CM

## 2019-08-19 DIAGNOSIS — F17210 Nicotine dependence, cigarettes, uncomplicated: Secondary | ICD-10-CM | POA: Diagnosis not present

## 2019-08-19 MED ORDER — CLINDAMYCIN HCL 300 MG PO CAPS: 300.0000 mg | ORAL_CAPSULE | Freq: Three times a day (TID) | ORAL | 0 refills | Status: DC

## 2019-08-19 MED ORDER — TRAMADOL HCL 50 MG PO TABS: 50.0000 mg | ORAL_TABLET | Freq: Four times a day (QID) | ORAL | 0 refills | Status: DC | PRN

## 2019-08-19 NOTE — ED Triage Notes (Signed)
Pt presents to UC with dental pain x 1 day.

## 2019-08-19 NOTE — Discharge Instructions (Signed)
Be aware, pain medications may cause drowsiness. Please do not drive, operate heavy machinery or make important decisions while on this medication, it can cloud your judgement.  

## 2019-08-19 NOTE — ED Provider Notes (Signed)
[place]   [id number] 08/19/19 Arrival Time: [id number]  ASSESSMENT & PLAN:  1. Pain, dental   2. Cigarette smoker     No sign of abscess requiring I&D at this time. Discussed.  Begin: Meds ordered this encounter  Medications  . clindamycin (CLEOCIN) 300 MG capsule    Sig: Take 1 capsule (300 mg total) by mouth 3 (three) times daily.    Dispense:  30 capsule    Refill:  0  . traMADol (ULTRAM) 50 MG tablet    Sig: Take 1 tablet (50 mg total) by mouth every 6 (six) hours as needed.    Dispense:  12 tablet    Refill:  0    [place] Controlled Substances Registry consulted for this patient. I feel the risk/benefit ratio today is favorable for proceeding with this prescription for a controlled substance. Medication sedation precautions given.  Dental resource written instructions given. He will schedule dental evaluation as soon as possible if not improving over the next 24-48 hours.  Smoking cessation counseling done; three plus minutes. Patient co-morbidities include HTN and renal insufficiency. Risks of smoking reviewed including possible lung disease (COPD), and cardiac disease. Benefits of smoking cessation also presented. Currently in pre-contemplative stage with no desire to quit at this time. Encourage him to think about this. Also encouraged him to follow-up with a primary care provider should he desire to quit.  Reviewed expectations re: course of current medical issues. Questions answered. Outlined signs and symptoms indicating need for more acute intervention. Patient verbalized understanding. After Visit Summary given.   SUBJECTIVE:  Drew Cuevas is a 49 y.o. male who reports fairly abrupt onset of left lower rear dental pain described as aching. Present for 1 day. Fever: absent. Tolerating PO intake but reports pain with chewing. Normal swallowing. He does not see a dentist regularly. No neck swelling or pain. OTC analgesics without relief.  Social History    Tobacco Use  Smoking Status Current Every Day Smoker  . Packs/day: 0.50  . Years: 30.00  . Pack years: 15.00  . Types: Cigarettes  . Last attempt to quit: 11/28/2015  . Years since quitting: 3.7  Smokeless Tobacco Never Used  Tobacco Comment   stopped May 2017   "I know I should quit".   OBJECTIVE: Vitals:   08/19/19 1130  BP: 124/88  Pulse: 72  Resp: 17  Temp: 98 F (36.7 C)  TempSrc: Oral  SpO2: 96%    General appearance: alert; no distress HENT: normocephalic; atraumatic; dentition: fair; left lower rear gum without areas of fluctuance, drainage, or bleeding and with reported pain; normal jaw movement without difficulty Neck: supple without LAD; FROM; trachea midline Lungs: normal respirations; unlabored Skin: warm and dry Psychological: alert and cooperative; normal mood and affect   Allergies  Allergen Reactions  . Ibuprofen Anaphylaxis, Swelling and Other (See Comments)    Dehydration Swelling of the "moist areas" (throat, mouth)  . Penicillins Hives  . Latex Rash    Past Medical History:  Diagnosis Date  . Bronchitis    LAST FLARE UP WAS NEW YEARS 2015  . Cancer ([place])    kidney  . GERD (gastroesophageal reflux disease)    NO MEDS  . HIV (human immunodeficiency virus infection) ([place])    UNDER CONTROL WITH MEDICATIONS  . Hypertension   . Immune deficiency disorder ([place])   . Renal insufficiency 05/17/2015  . Renal mass, right    Social History   Socioeconomic History  . Marital status: Single  Spouse name: Not on file  . Number of children: Not on file  . Years of education: Not on file  . Highest education level: Not on file  Occupational History  . Not on file  Tobacco Use  . Smoking status: Current Every Day Smoker    Packs/day: 0.50    Years: 30.00    Pack years: 15.00    Types: Cigarettes    Last attempt to quit: 11/28/2015    Years since quitting: 3.7  . Smokeless tobacco: Never Used  . Tobacco comment: stopped May 2017   Substance and Sexual Activity  . Alcohol use: No    Comment: stopped 10-12 years ago  . Drug use: No  . Sexual activity: Not Currently    Partners: Male    Comment: declined condoms  Other Topics Concern  . Not on file  Social History Narrative  . Not on file   Social Determinants of Health   Financial Resource Strain:   . Difficulty of Paying Living Expenses: Not on file  Food Insecurity:   . Worried About Charity fundraiser in the Last Year: Not on file  . Ran Out of Food in the Last Year: Not on file  Transportation Needs:   . Lack of Transportation (Medical): Not on file  . Lack of Transportation (Non-Medical): Not on file  Physical Activity:   . Days of Exercise per Week: Not on file  . Minutes of Exercise per Session: Not on file  Stress:   . Feeling of Stress : Not on file  Social Connections:   . Frequency of Communication with Friends and Family: Not on file  . Frequency of Social Gatherings with Friends and Family: Not on file  . Attends Religious Services: Not on file  . Active Member of Clubs or Organizations: Not on file  . Attends Archivist Meetings: Not on file  . Marital Status: Not on file  Intimate Partner Violence:   . Fear of Current or Ex-Partner: Not on file  . Emotionally Abused: Not on file  . Physically Abused: Not on file  . Sexually Abused: Not on file   Family History  Problem Relation Age of Onset  . Hypertension Mother   . Hypertension Sister   . Hypertension Brother    Past Surgical History:  Procedure Laterality Date  . NO PAST SURGERIES    . ROBOT ASSISTED LAPAROSCOPIC NEPHRECTOMY Right 08/19/2013   Procedure: ROBOTIC ASSISTED LAPAROSCOPIC RIGHT PARTIAL NEPHRECTOMY;  Surgeon: Ardis Hughs, MD;  Location: [facility];  Service: Urology;  Laterality: Right;     Vanessa Kick, MD 08/19/19 1214

## 2019-08-20 ENCOUNTER — Ambulatory Visit: Payer: BLUE CROSS/BLUE SHIELD | Admitting: Internal Medicine

## 2019-10-02 ENCOUNTER — Other Ambulatory Visit: Payer: Self-pay | Admitting: Internal Medicine

## 2019-10-02 DIAGNOSIS — G4709 Other insomnia: Secondary | ICD-10-CM

## 2019-10-02 DIAGNOSIS — F32A Depression, unspecified: Secondary | ICD-10-CM

## 2019-10-02 DIAGNOSIS — F329 Major depressive disorder, single episode, unspecified: Secondary | ICD-10-CM

## 2019-10-23 ENCOUNTER — Other Ambulatory Visit: Payer: Self-pay

## 2019-10-23 ENCOUNTER — Ambulatory Visit: Payer: BLUE CROSS/BLUE SHIELD | Attending: Internal Medicine | Admitting: Internal Medicine

## 2019-10-23 ENCOUNTER — Encounter: Payer: Self-pay | Admitting: Internal Medicine

## 2019-10-23 DIAGNOSIS — I10 Essential (primary) hypertension: Secondary | ICD-10-CM

## 2019-10-23 DIAGNOSIS — F3342 Major depressive disorder, recurrent, in full remission: Secondary | ICD-10-CM | POA: Diagnosis not present

## 2019-10-23 DIAGNOSIS — F17200 Nicotine dependence, unspecified, uncomplicated: Secondary | ICD-10-CM

## 2019-10-23 DIAGNOSIS — Z8639 Personal history of other endocrine, nutritional and metabolic disease: Secondary | ICD-10-CM

## 2019-10-23 DIAGNOSIS — B20 Human immunodeficiency virus [HIV] disease: Secondary | ICD-10-CM

## 2019-10-23 DIAGNOSIS — E785 Hyperlipidemia, unspecified: Secondary | ICD-10-CM

## 2019-10-23 DIAGNOSIS — H538 Other visual disturbances: Secondary | ICD-10-CM

## 2019-10-23 DIAGNOSIS — C641 Malignant neoplasm of right kidney, except renal pelvis: Secondary | ICD-10-CM

## 2019-10-23 NOTE — Progress Notes (Signed)
Virtual Visit via Telephone Note Due to current restrictions/limitations of in-office visits due to the COVID-19 pandemic, this scheduled clinical appointment was converted to a telehealth visit  I connected with Drew Cuevas on 10/23/19 at 9:22 a.m by telephone and verified that I am speaking with the correct person using two identifiers. I am in my office.  The patient is at home.  Only the patient and myself participated in this encounter.  I discussed the limitations, risks, security and privacy concerns of performing an evaluation and management service by telephone and the availability of in person appointments. I also discussed with the patient that there may be a patient responsible charge related to this service. The patient expressed understanding and agreed to proceed.   History of Present Illness: HTN, HL, HIV,OA RT knee, rectal fistulectomy, Renal cell CAs/ppartial nephrology, depression, CKD stage3.  HIV: saw Dr. Megan Salon since last visit. Viral load undetectable. Reports compliance with antiretroviral therapy.   HYPERTENSION Currently taking: see medication list Med Adherence: [x]  Yes    []  No Medication side effects: []  Yes    [x]  No Adherence with salt restriction: [x]  Yes    []  No Home Monitoring?: [x]  Yes    []  No Monitoring Frequency:  Last checked 3 wks ago Home BP results range: Last two blood pressures documented in the chart were 124/88 in January of this year and 124/76 in November of last year. SOB? []  Yes    [x]  No Chest Pain?: []  Yes    [x]  No Leg swelling?: []  Yes    [x]  No Headaches?: []  Yes    [x]  No Dizziness? []  Yes    [x]  No Comments:   HL: On last visit we had change the pravastatin to lovastatin. Tolerating Lovastatin.   Depression: Reportedly doing better on Lexapro.  Renal Cell CA: tells me he has not seen urologist in few yrs.  Had surgery 2015. He had followed up for several years and was deemed to be free of recurrence  DM: dx with DM 2018  by Dr. Doreene Burke with A1C 7.8. He was on glipizide but was taken off of it because his blood sugars were bottoming out. We subsequently rechecked his A1c in 2019 and it was 5.4. He tells me that he does okay with his eating habits. -He endorses some blurred vision and would like to be referred to have an eye exam done.  Tob dep:  No change since last visit. On that visit he tells me he was smoking 2 cigarettes/day. He is still trying to quit but wants to do so on his own.  Outpatient Encounter Medications as of 10/23/2019  Medication Sig  . abacavir-dolutegravir-lamiVUDine (TRIUMEQ) 600-50-300 MG tablet Take 1 tablet by mouth daily.  Marland Kitchen amLODipine (NORVASC) 5 MG tablet TAKE 1 TABLET(5 MG) BY MOUTH DAILY.  . clindamycin (CLEOCIN) 300 MG capsule Take 1 capsule (300 mg total) by mouth 3 (three) times daily.  . diphenhydrAMINE (BENADRYL) 25 mg capsule Take 25 mg by mouth every 6 (six) hours as needed for itching or allergies.  Marland Kitchen escitalopram (LEXAPRO) 10 MG tablet TAKE 1 TABLET(10 MG) BY MOUTH DAILY  . hydrocortisone cream 0.5 % Apply 1 application topically daily as needed for itching.  . lovastatin (MEVACOR) 40 MG tablet Take 1 tablet (40 mg total) by mouth at bedtime.  . traMADol (ULTRAM) 50 MG tablet Take 1 tablet (50 mg total) by mouth every 6 (six) hours as needed.  . traZODone (DESYREL) 50 MG tablet TAKE 1/2 TABLET(25  MG) BY MOUTH AT BEDTIME  . vitamin C (ASCORBIC ACID) 500 MG tablet Take 500 mg by mouth at bedtime.    No facility-administered encounter medications on file as of 10/23/2019.      Observations/Objective:   Chemistry      Component Value Date/Time   NA 141 05/19/2019 1341   NA 143 01/13/2018 1653   K 3.9 05/19/2019 1341   CL 108 05/19/2019 1341   CO2 22 05/19/2019 1341   BUN 11 05/19/2019 1341   BUN 12 01/13/2018 1653   CREATININE 1.73 (H) 05/19/2019 1341      Component Value Date/Time   CALCIUM 8.9 05/19/2019 1341   ALKPHOS 87 01/13/2018 1653   AST 13 05/19/2019  1341   ALT 13 05/19/2019 1341   BILITOT 0.3 05/19/2019 1341   BILITOT 0.3 01/13/2018 1653     Lab Results  Component Value Date   CHOL 235 (H) 05/19/2019   HDL 26 (L) 05/19/2019   [place]  05/19/2019     Comment:     . LDL cholesterol not calculated. Triglyceride levels greater than 400 mg/dL invalidate calculated LDL results. . Reference range: <100 . Desirable range <100 mg/dL for primary prevention;   <70 mg/dL for patients with CHD or diabetic patients  with > or = 2 CHD risk factors. Marland Kitchen LDL-C is now calculated using the Martin-Hopkins  calculation, which is a validated novel method providing  better accuracy than the Friedewald equation in the  estimation of LDL-C.  Cresenciano Genre et al. Annamaria Helling. WG:[id number]): 2061-2068  (http://education.[employer].com/faq/FAQ164)    TRIG 405 (H) 05/19/2019   CHOLHDL 9.0 (H) 05/19/2019     Assessment and Plan: 1. Essential hypertension Encourage patient to check blood pressure at least twice a week with goal being 130/80 or lower. Continue Norvasc. - Basic metabolic panel; Future  2. Dyslipidemia Continue Mevacor. Recheck lipid profile - Lipid panel; Future  3. Major depressive disorder, recurrent episode, in full remission ([place]) Continue Lexapro.  4. Human immunodeficiency virus (HIV) disease ([place]) Followed by ID  5. Renal cell carcinoma, unspecified laterality ([place]) In remission  6. Tobacco dependence Strongly encouraged him to quit. Should consider setting a quit date  7. History of diet-controlled diabetes - Hemoglobin A1c; Future - Microalbumin / creatinine urine ratio; Future  8. Blurred vision - Ambulatory referral to Ophthalmology   Follow Up Instructions: 4 mths   I discussed the assessment and treatment plan with the patient. The patient was provided an opportunity to ask questions and all were answered. The patient agreed with the plan and demonstrated an understanding of the instructions.   The  patient was advised to call back or seek an in-person evaluation if the symptoms worsen or if the condition fails to improve as anticipated.  I provided 14 minutes of non-face-to-face time during this encounter.   Karle Plumber, MD

## 2020-01-27 ENCOUNTER — Other Ambulatory Visit: Payer: Self-pay | Admitting: Internal Medicine

## 2020-01-27 DIAGNOSIS — I10 Essential (primary) hypertension: Secondary | ICD-10-CM

## 2020-01-27 NOTE — Telephone Encounter (Signed)
Requested Prescriptions  Pending Prescriptions Disp Refills  . amLODipine (NORVASC) 5 MG tablet [Pharmacy Med Name: AMLODIPINE BESYLATE 5MG  TABLETS] 30 tablet 6    Sig: TAKE 1 TABLET(5 MG) BY MOUTH DAILY     Cardiovascular:  Calcium Channel Blockers Passed - 01/27/2020  1:40 PM      Passed - Last BP in normal range    BP Readings from Last 1 Encounters:  08/19/19 124/88         Passed - Valid encounter within last 6 months    Recent Outpatient Visits          3 months ago Essential hypertension   [place], MD   7 months ago Essential hypertension   [place], MD   1 year ago Essential hypertension   [place], MD   2 years ago Obesity (BMI 30-39.9)   [place] Ladell Pier, MD   2 years ago Essential hypertension   [place], Marlena Clipper, MD

## 2020-03-18 ENCOUNTER — Encounter (HOSPITAL_COMMUNITY): Payer: Self-pay | Admitting: Emergency Medicine

## 2020-03-18 ENCOUNTER — Telehealth: Payer: Self-pay

## 2020-03-18 ENCOUNTER — Ambulatory Visit (HOSPITAL_COMMUNITY)
Admission: EM | Admit: 2020-03-18 | Discharge: 2020-03-18 | Disposition: A | Discharge status: Home / Self Care | Payer: BLUE CROSS/BLUE SHIELD | Attending: Family Medicine | Admitting: Family Medicine

## 2020-03-18 ENCOUNTER — Emergency Department (HOSPITAL_COMMUNITY)
Admission: EM | Admit: 2020-03-18 | Discharge: 2020-03-18 | Disposition: A | Discharge status: Other Acute Inpatient Hospital | Payer: BLUE CROSS/BLUE SHIELD

## 2020-03-18 ENCOUNTER — Other Ambulatory Visit: Payer: Self-pay

## 2020-03-18 DIAGNOSIS — L0291 Cutaneous abscess, unspecified: Secondary | ICD-10-CM

## 2020-03-18 MED ORDER — SULFAMETHOXAZOLE-TRIMETHOPRIM 800-160 MG PO TABS: 1.0000 | ORAL_TABLET | Freq: Two times a day (BID) | ORAL | 0 refills | Status: AC

## 2020-03-18 NOTE — Discharge Instructions (Addendum)
Take antibiotic 2 x a day Take 2 doses today Warm compresses to area May take tylenol for pain

## 2020-03-18 NOTE — Telephone Encounter (Signed)
Patient called office today requesting antibiotic for boil. States he went to PCP, but was told he cannot be seen until after morning clinicals. Would be around 2-3. Did go to [facility] ED, but left.  Advised patient to visit Urgent care today for boil since MD is not in office until Monday. Will try Urgent care later today. Would like for a message to be sent to MD. Aundria Rud, [place]

## 2020-03-18 NOTE — ED Triage Notes (Signed)
Pt c/o boil on buttock x 2 days. Pt states it is very painful and is causing some back pain.

## 2020-03-22 ENCOUNTER — Other Ambulatory Visit: Payer: Self-pay | Admitting: Pharmacist

## 2020-03-22 DIAGNOSIS — I10 Essential (primary) hypertension: Secondary | ICD-10-CM

## 2020-03-22 MED ORDER — AMLODIPINE BESYLATE 5 MG PO TABS: 5.0000 mg | ORAL_TABLET | Freq: Every day | ORAL | 0 refills | Status: DC

## 2020-04-15 ENCOUNTER — Telehealth: Payer: Self-pay | Admitting: *Deleted

## 2020-04-15 NOTE — Telephone Encounter (Signed)
Left message asking Mr. Ziegler to call his pharmacy (they have been unable to reach him to schedule delivery) and to call his doctor's office to schedule his November appointment. Landis Gandy, RN

## 2020-05-09 ENCOUNTER — Ambulatory Visit (INDEPENDENT_AMBULATORY_CARE_PROVIDER_SITE_OTHER): Payer: BLUE CROSS/BLUE SHIELD

## 2020-05-09 ENCOUNTER — Ambulatory Visit (HOSPITAL_COMMUNITY)
Admission: EM | Admit: 2020-05-09 | Discharge: 2020-05-09 | Disposition: A | Discharge status: Home / Self Care | Payer: BLUE CROSS/BLUE SHIELD | Attending: Family Medicine | Admitting: Family Medicine

## 2020-05-09 ENCOUNTER — Other Ambulatory Visit: Payer: Self-pay

## 2020-05-09 ENCOUNTER — Encounter (HOSPITAL_COMMUNITY): Payer: Self-pay

## 2020-05-09 DIAGNOSIS — S93492A Sprain of other ligament of left ankle, initial encounter: Secondary | ICD-10-CM

## 2020-05-09 DIAGNOSIS — S99912A Unspecified injury of left ankle, initial encounter: Secondary | ICD-10-CM

## 2020-05-09 MED ORDER — TRAMADOL HCL 50 MG PO TABS: 50.0000 mg | ORAL_TABLET | Freq: Four times a day (QID) | ORAL | 0 refills | Status: DC | PRN

## 2020-05-09 NOTE — ED Provider Notes (Signed)
[facility]   [id number] 05/09/20 Arrival Time: 0848  ASSESSMENT & PLAN:  1. Sprain of anterior talofibular ligament of left ankle, initial encounter     I have personally viewed the imaging studies ordered this visit. No ankle fx appreciated.  Allergic to NSAIDS.  Prefers CAM walker to help him ambulate at work.    Meds ordered this encounter  Medications   traMADol (ULTRAM) 50 MG tablet    Sig: Take 1 tablet (50 mg total) by mouth every 6 (six) hours as needed.    Dispense:  15 tablet    Refill:  0    Orders Placed This Encounter  Procedures   DG Ankle Complete Left   Apply CAM boot    Recommend:  Follow-up Information    [place].   Why: If worsening or failing to improve as anticipated. Contact information: [address] [place] [phone]              Reviewed expectations re: course of current medical issues. Questions answered. Outlined signs and symptoms indicating need for more acute intervention. Patient verbalized understanding. After Visit Summary given.  SUBJECTIVE: History from: patient. Drew Cuevas is a 49 y.o. male who reports inversion L ankle injury yesterday. Able to bear weight but with pain. Mild swelling. No extremity sensation changes or weakness. No OTC treatment.  Past Surgical History:  Procedure Laterality Date   NO PAST SURGERIES     ROBOT ASSISTED LAPAROSCOPIC NEPHRECTOMY Right 08/19/2013   Procedure: ROBOTIC ASSISTED LAPAROSCOPIC RIGHT PARTIAL NEPHRECTOMY;  Surgeon: Ardis Hughs, MD;  Location: [facility];  Service: Urology;  Laterality: Right;      OBJECTIVE:  Vitals:   05/09/20 0958  BP: (!) 121/94  Pulse: 89  Resp: 18  Temp: 98 F (36.7 C)  TempSrc: Oral  SpO2: 100%    General appearance: alert; no distress HEENT: [place]; AT Neck: supple with FROM Resp: unlabored respirations Extremities:  LLE: warm with well perfused  appearance; fairly well localized moderate tenderness over left lateral ankle, ATFL distribution; without gross deformities; swelling: minimal; bruising: none; ankle ROM: normal, with discomfort CV: brisk extremity capillary refill of LLE; 2+ DP pulse of LLE. Skin: warm and dry; no visible rashes Neurologic: normal sensation and strength of LLE Psychological: alert and cooperative; normal mood and affect  Imaging: DG Ankle Complete Left  Result Date: 05/09/2020 CLINICAL DATA:  Inversion injury. EXAM: LEFT ANKLE COMPLETE - 3+ VIEW COMPARISON:  None. FINDINGS: There is no evidence of fracture, dislocation, or joint effusion. There is no evidence of arthropathy or other focal bone abnormality. Lateral soft tissue swelling. IMPRESSION: 1. No acute bone abnormality. 2. Lateral soft tissue swelling. Electronically Signed   By: Kerby Moors M.D.   On: 05/09/2020 10:37      Allergies  Allergen Reactions   Ibuprofen Anaphylaxis, Swelling and Other (See Comments)    Dehydration Swelling of the "moist areas" (throat, mouth)   Penicillins Hives        Latex Rash    Past Medical History:  Diagnosis Date   Bronchitis    LAST FLARE UP WAS NEW YEARS 2015   Cancer [facility])    kidney   GERD (gastroesophageal reflux disease)    NO MEDS   HIV (human immunodeficiency virus infection) ([place])    UNDER CONTROL WITH MEDICATIONS   Hypertension    Immune deficiency disorder ([place])    Renal insufficiency 05/17/2015   Renal  mass, right    Social History   Socioeconomic History   Marital status: Single    Spouse name: Not on file   Number of children: Not on file   Years of education: Not on file   Highest education level: Not on file  Occupational History   Not on file  Tobacco Use   Smoking status: Current Every Day Smoker    Packs/day: 0.50    Years: 30.00    Pack years: 15.00    Types: Cigarettes    Last attempt to quit: 11/28/2015    Years since quitting: 4.4    Smokeless tobacco: Never Used   Tobacco comment: stopped May 2017  Vaping Use   Vaping Use: Never used  Substance and Sexual Activity   Alcohol use: No    Comment: stopped 10-12 years ago   Drug use: No   Sexual activity: Not Currently    Partners: Male    Comment: declined condoms  Other Topics Concern   Not on file  Social History Narrative   Not on file   Social Determinants of Health   Financial Resource Strain:    Difficulty of Paying Living Expenses: Not on file  Food Insecurity:    Worried About Charity fundraiser in the Last Year: Not on file   [employer] of Food in the Last Year: Not on file  Transportation Needs:    Lack of Transportation (Medical): Not on file   Lack of Transportation (Non-Medical): Not on file  Physical Activity:    Days of Exercise per Week: Not on file   Minutes of Exercise per Session: Not on file  Stress:    Feeling of Stress : Not on file  Social Connections:    Frequency of Communication with Friends and Family: Not on file   Frequency of Social Gatherings with Friends and Family: Not on file   Attends Religious Services: Not on file   Active Member of Clubs or Organizations: Not on file   Attends Archivist Meetings: Not on file   Marital Status: Not on file   Family History  Problem Relation Age of Onset   Hypertension Mother    Hypertension Sister    Hypertension Brother    Past Surgical History:  Procedure Laterality Date   NO PAST SURGERIES     ROBOT ASSISTED LAPAROSCOPIC NEPHRECTOMY Right 08/19/2013   Procedure: ROBOTIC ASSISTED LAPAROSCOPIC RIGHT PARTIAL NEPHRECTOMY;  Surgeon: Ardis Hughs, MD;  Location: [facility];  Service: Urology;  Laterality: Right;      Vanessa Kick, MD 05/09/20 1110

## 2020-05-09 NOTE — Discharge Instructions (Addendum)
Be aware, you have been prescribed pain medications that may cause drowsiness. Do not combine with alcohol or other illicit drugs. Please do not drive, operate heavy machinery, or take part in activities that require making important decisions while on this medication as your judgement may be clouded.  

## 2020-05-09 NOTE — ED Triage Notes (Signed)
Pt presents with left ankle pain after he stepped into a dip in the ground.

## 2020-06-02 ENCOUNTER — Other Ambulatory Visit: Payer: Self-pay | Admitting: Internal Medicine

## 2020-06-02 DIAGNOSIS — B20 Human immunodeficiency virus [HIV] disease: Secondary | ICD-10-CM

## 2020-06-07 ENCOUNTER — Other Ambulatory Visit: Payer: BLUE CROSS/BLUE SHIELD

## 2020-06-07 ENCOUNTER — Other Ambulatory Visit: Payer: Self-pay

## 2020-06-07 DIAGNOSIS — B20 Human immunodeficiency virus [HIV] disease: Secondary | ICD-10-CM

## 2020-06-08 LAB — T-HELPER CELL (CD4) - (RCID CLINIC ONLY)
CD4 % Helper T Cell: 33 % (ref 33–65)
CD4 T Cell Abs: 934 /uL (ref 400–1790)

## 2020-06-13 ENCOUNTER — Other Ambulatory Visit: Payer: Self-pay | Admitting: Internal Medicine

## 2020-06-13 DIAGNOSIS — I10 Essential (primary) hypertension: Secondary | ICD-10-CM

## 2020-06-13 DIAGNOSIS — G4709 Other insomnia: Secondary | ICD-10-CM

## 2020-06-13 LAB — COMPREHENSIVE METABOLIC PANEL
AG Ratio: 1.4 (calc) (ref 1.0–2.5)
ALT: 28 U/L (ref 9–46)
AST: 21 U/L (ref 10–40)
Albumin: 3.8 g/dL (ref 3.6–5.1)
Alkaline phosphatase (APISO): 93 U/L (ref 36–130)
BUN/Creatinine Ratio: 9 (calc) (ref 6–22)
BUN: 14 mg/dL (ref 7–25)
CO2: 27 mmol/L (ref 20–32)
Calcium: 9.8 mg/dL (ref 8.6–10.3)
Chloride: 110 mmol/L (ref 98–110)
Creat: 1.54 mg/dL — ABNORMAL HIGH (ref 0.60–1.35)
Globulin: 2.7 g/dL (calc) (ref 1.9–3.7)
Glucose, Bld: 86 mg/dL (ref 65–99)
Potassium: 4.6 mmol/L (ref 3.5–5.3)
Sodium: 140 mmol/L (ref 135–146)
Total Bilirubin: 0.4 mg/dL (ref 0.2–1.2)
Total Protein: 6.5 g/dL (ref 6.1–8.1)

## 2020-06-13 LAB — CBC
HCT: 40.9 % (ref 38.5–50.0)
Hemoglobin: 14.1 g/dL (ref 13.2–17.1)
MCH: 32.8 pg (ref 27.0–33.0)
MCHC: 34.5 g/dL (ref 32.0–36.0)
MCV: 95.1 fL (ref 80.0–100.0)
MPV: 10.9 fL (ref 7.5–12.5)
Platelets: 302 10*3/uL (ref 140–400)
RBC: 4.3 10*6/uL (ref 4.20–5.80)
RDW: 13.6 % (ref 11.0–15.0)
WBC: 6.5 10*3/uL (ref 3.8–10.8)

## 2020-06-13 LAB — HIV-1 RNA QUANT-NO REFLEX-BLD
HIV 1 RNA Quant: <20 Copies/mL
HIV-1 RNA Quant, Log: <1.3 Log cps/mL

## 2020-06-13 LAB — LIPID PANEL
Cholesterol: 201 mg/dL — ABNORMAL HIGH (ref <200)
HDL: 38 mg/dL — ABNORMAL LOW (ref >=40)
LDL Cholesterol (Calc): 144 mg/dL (calc) — ABNORMAL HIGH
Non-HDL Cholesterol (Calc): 163 mg/dL (calc) — ABNORMAL HIGH (ref <130)
Total CHOL/HDL Ratio: 5.3 (calc) — ABNORMAL HIGH (ref <5.0)
Triglycerides: 83 mg/dL (ref <150)

## 2020-06-13 LAB — RPR: RPR Ser Ql: NONREACTIVE

## 2020-06-13 NOTE — Telephone Encounter (Signed)
Requested Prescriptions  Pending Prescriptions Disp Refills   traZODone (DESYREL) 50 MG tablet [Pharmacy Med Name: TRAZODONE 50MG  TABLETS] 15 tablet 0    Sig: TAKE 1/2 TABLET(25 MG) BY MOUTH AT BEDTIME     Psychiatry: Antidepressants - Serotonin Modulator Failed - 06/13/2020  4:27 PM      Failed - Valid encounter within last 6 months    Recent Outpatient Visits          7 months ago Essential hypertension   [place], MD   12 months ago Essential hypertension   [place], MD   2 years ago Essential hypertension   [place], MD   2 years ago Obesity (BMI 30-39.9)   [place] Ladell Pier, MD   3 years ago Essential hypertension   [place], MD      Future Appointments            In 1 month Ladell Pier, MD [place] - Completed PHQ-2 or PHQ-9 in the last 360 days       amLODipine (NORVASC) 5 MG tablet [Pharmacy Med Name: AMLODIPINE BESYLATE 5MG  TABLETS] 30 tablet 0    Sig: TAKE 1 TABLET BY MOUTH DAILY     Cardiovascular:  Calcium Channel Blockers Failed - 06/13/2020  4:27 PM      Failed - Last BP in normal range    BP Readings from Last 1 Encounters:  05/09/20 (!) 121/94         Failed - Valid encounter within last 6 months    Recent Outpatient Visits          7 months ago Essential hypertension   [place], MD   12 months ago Essential hypertension   [place], MD   2 years ago Essential hypertension   [place], MD   2 years ago Obesity (BMI 30-39.9)   [place], MD    3 years ago Essential hypertension   [place], MD      Future Appointments            In 1 month Wynetta Emery Dalbert Batman, MD [place]           patient has OV scheduled 30 day courtesy refills given

## 2020-06-28 ENCOUNTER — Encounter: Payer: Self-pay | Admitting: Internal Medicine

## 2020-06-28 ENCOUNTER — Other Ambulatory Visit: Payer: Self-pay

## 2020-06-28 ENCOUNTER — Ambulatory Visit (INDEPENDENT_AMBULATORY_CARE_PROVIDER_SITE_OTHER): Payer: BLUE CROSS/BLUE SHIELD | Admitting: Internal Medicine

## 2020-06-28 DIAGNOSIS — B20 Human immunodeficiency virus [HIV] disease: Secondary | ICD-10-CM | POA: Diagnosis not present

## 2020-06-28 MED ORDER — TRIUMEQ 600-50-300 MG PO TABS: 1.0000 | ORAL_TABLET | Freq: Every day | ORAL | 11 refills | Status: DC

## 2020-06-28 NOTE — Progress Notes (Signed)
Patient Active Problem List   Diagnosis Date Noted  . Human immunodeficiency virus (HIV) disease ([place]) 08/28/2006    Priority: High  . Renal cell carcinoma of right kidney ([place]) 08/19/2013    Priority: Medium  . Essential hypertension 04/28/2008    Priority: Medium  . Depression 06/19/2019  . Other insomnia 06/19/2019  . CKD (chronic kidney disease) stage 3, GFR 30-59 ml/min (H[facility]) 04/15/2018  . Tobacco dependence 01/13/2018  . Primary osteoarthritis of right knee 04/03/2017  . Dyslipidemia 06/14/2016  . Renal cyst 05/21/2013  . Obesity (BMI 30-39.9) 05/26/2012  . Internal hemorrhoids 12/01/2008  . Major depressive disorder, single episode, severe ([place]) 12/20/2006    Patient's Medications  New Prescriptions   No medications on file  Previous Medications   AMLODIPINE (NORVASC) 5 MG TABLET    TAKE 1 TABLET BY MOUTH DAILY   DIPHENHYDRAMINE (BENADRYL) 25 MG CAPSULE    Take 25 mg by mouth every 6 (six) hours as needed for itching or allergies.   ESCITALOPRAM (LEXAPRO) 10 MG TABLET    TAKE 1 TABLET(10 MG) BY MOUTH DAILY   LOVASTATIN (MEVACOR) 40 MG TABLET    Take 1 tablet (40 mg total) by mouth at bedtime.   TRAMADOL (ULTRAM) 50 MG TABLET    Take 1 tablet (50 mg total) by mouth every 6 (six) hours as needed.   TRAZODONE (DESYREL) 50 MG TABLET    TAKE 1/2 TABLET(25 MG) BY MOUTH AT BEDTIME   VITAMIN C (ASCORBIC ACID) 500 MG TABLET    Take 500 mg by mouth at bedtime.   Modified Medications   Modified Medication Previous Medication   ABACAVIR-DOLUTEGRAVIR-LAMIVUDINE (TRIUMEQ) 600-50-300 MG TABLET TRIUMEQ 600-50-300 MG tablet      Take 1 tablet by mouth daily.    TAKE 1 TABLET BY MOUTH DAILY.  Discontinued Medications   No medications on file    Subjective: Drew Cuevas he is in for his routine HIV follow-up visit.  He is accompanied by his partner, Drew Cuevas.  Drew Cuevas has not had any problems obtaining, taking or tolerating his Triumeq.  He recalls missing only 1 dose since his  last visit.  He is feeling well.  He is still smoking 1/2 pack of cigarettes daily.  He is not getting any regular exercise.  He is not feeling anxious or depressed.  He has already received his influenza vaccination and his Covid booster.  Review of Systems: Review of Systems  Constitutional: Negative for fever and weight loss.  Psychiatric/Behavioral: Negative for depression. The patient is not nervous/anxious.     Past Medical History:  Diagnosis Date  . Bronchitis    LAST FLARE UP WAS NEW YEARS 2015  . Cancer ([place])    kidney  . GERD (gastroesophageal reflux disease)    NO MEDS  . HIV (human immunodeficiency virus infection) ([place])    UNDER CONTROL WITH MEDICATIONS  . Hypertension   . Immune deficiency disorder ([place])   . Renal insufficiency 05/17/2015  . Renal mass, right     Social History   Tobacco Use  . Smoking status: Current Every Day Smoker    Packs/day: 0.50    Years: 30.00    Pack years: 15.00    Types: Cigarettes    Last attempt to quit: 11/28/2015    Years since quitting: 4.5  . Smokeless tobacco: Never Used  . Tobacco comment: stopped May 2017  Vaping Use  . Vaping Use: Never used  Substance Use Topics  .  Alcohol use: No    Comment: stopped 10-12 years ago  . Drug use: No    Family History  Problem Relation Age of Onset  . Hypertension Mother   . Hypertension Sister   . Hypertension Brother       Health Maintenance  Topic Date Due  . FOOT EXAM  Never done  . OPHTHALMOLOGY EXAM  Never done  . URINE MICROALBUMIN  Never done  . HEMOGLOBIN A1C  07/15/2018  . TETANUS/TDAP  07/24/2027  . INFLUENZA VACCINE  Completed  . PNEUMOCOCCAL POLYSACCHARIDE VACCINE AGE 31-64 HIGH RISK  Completed  . COVID-19 Vaccine  Completed  . Hepatitis C Screening  Completed  . HIV Screening  Completed    Objective:  Vitals:   06/28/20 1502  BP: (!) 151/101  Pulse: (!) 105  Temp: 98 F (36.7 C)  TempSrc: Oral  Weight: 226 lb (102.5 kg)   Body mass index is  36.48 kg/m.  Physical Exam Constitutional:      Comments: He is in good spirits.  His weight is up 7 pounds over the past year.  Cardiovascular:     Rate and Rhythm: Normal rate.  Pulmonary:     Effort: Pulmonary effort is normal.  Psychiatric:        Mood and Affect: Mood normal.     Lab Results Lab Results  Component Value Date   WBC 6.5 06/07/2020   HGB 14.1 06/07/2020   HCT 40.9 06/07/2020   MCV 95.1 06/07/2020   PLT 302 06/07/2020    Lab Results  Component Value Date   CREATININE 1.54 (H) 06/07/2020   BUN 14 06/07/2020   NA 140 06/07/2020   K 4.6 06/07/2020   CL 110 06/07/2020   CO2 27 06/07/2020    Lab Results  Component Value Date   ALT 28 06/07/2020   AST 21 06/07/2020   ALKPHOS 87 01/13/2018   BILITOT 0.4 06/07/2020    Lab Results  Component Value Date   CHOL 201 (H) 06/07/2020   HDL 38 (L) 06/07/2020   LDLCALC 144 (H) 06/07/2020   TRIG 83 06/07/2020   CHOLHDL 5.3 (H) 06/07/2020   Lab Results  Component Value Date   LABRPR NON-REACTIVE 06/07/2020   HIV 1 RNA Quant  Date Value  06/07/2020 <20 Copies/mL  05/19/2019 <20 DETECTED copies/mL (A)  04/09/2018 42 copies/mL (H)   CD4 T Cell Abs (/uL)  Date Value  06/07/2020 934  05/19/2019 953  04/09/2018 1,130     Problem List Items Addressed This Visit      High   Human immunodeficiency virus (HIV) disease ([place])    His infection remains under excellent, long-term control.  He will continue Triumeq and follow-up after lab work in 1 year.  Him that he should never have complications related to his infection so he should work on cigarette use, weight and other general medical problems.      Relevant Medications   abacavir-dolutegravir-lamiVUDine (TRIUMEQ) 841-32-440 MG tablet   Other Relevant Orders   CBC   T-helper cell (CD4)- ([facility] clinic only)   Comprehensive metabolic panel   Lipid panel   RPR   HIV-1 RNA quant-no reflex-bld        Michel Bickers, MD [facility] for  [place] [phone] pager   [phone] cell 06/28/2020, 3:22 PM

## 2020-06-28 NOTE — Assessment & Plan Note (Signed)
His infection remains under excellent, long-term control.  He will continue Triumeq and follow-up after lab work in 1 year.  Him that he should never have complications related to his infection so he should work on cigarette use, weight and other general medical problems.

## 2020-07-01 ENCOUNTER — Other Ambulatory Visit: Payer: Self-pay | Admitting: Internal Medicine

## 2020-07-01 DIAGNOSIS — B20 Human immunodeficiency virus [HIV] disease: Secondary | ICD-10-CM

## 2020-07-08 ENCOUNTER — Other Ambulatory Visit: Payer: Self-pay | Admitting: Internal Medicine

## 2020-07-08 DIAGNOSIS — G4709 Other insomnia: Secondary | ICD-10-CM

## 2020-07-14 ENCOUNTER — Ambulatory Visit: Payer: BLUE CROSS/BLUE SHIELD | Attending: Internal Medicine | Admitting: Internal Medicine

## 2020-07-14 ENCOUNTER — Encounter: Payer: Self-pay | Admitting: Internal Medicine

## 2020-07-14 ENCOUNTER — Other Ambulatory Visit: Payer: Self-pay

## 2020-07-14 VITALS — Ht 66.0 in

## 2020-07-14 DIAGNOSIS — Z538 Procedure and treatment not carried out for other reasons: Secondary | ICD-10-CM

## 2020-07-14 NOTE — Progress Notes (Signed)
Phone call placed to patient at 5:03 PM to do his telephone visit.  I got his mailbox which said that the mailbox is full and I was unable to leave a message.  We will have front desk reach out to him to reschedule his appointment.

## 2020-07-15 ENCOUNTER — Other Ambulatory Visit: Payer: Self-pay | Admitting: Internal Medicine

## 2020-07-15 DIAGNOSIS — I10 Essential (primary) hypertension: Secondary | ICD-10-CM

## 2020-07-15 DIAGNOSIS — E785 Hyperlipidemia, unspecified: Secondary | ICD-10-CM

## 2020-07-15 DIAGNOSIS — G4709 Other insomnia: Secondary | ICD-10-CM

## 2020-07-15 MED ORDER — LOVASTATIN 40 MG PO TABS: 40.0000 mg | ORAL_TABLET | Freq: Every day | ORAL | 0 refills | Status: DC

## 2020-07-15 MED ORDER — AMLODIPINE BESYLATE 5 MG PO TABS: 5.0000 mg | ORAL_TABLET | Freq: Every day | ORAL | 0 refills | Status: DC

## 2020-07-15 MED ORDER — TRAZODONE HCL 50 MG PO TABS: ORAL_TABLET | ORAL | 0 refills | Status: DC

## 2020-07-15 NOTE — Telephone Encounter (Signed)
Medication:traZODone (DESYREL) 50 MG tablet [[id number]]  , lovastatin (MEVACOR) 40 MG tablet [[id number]] ,amLODipine (NORVASC) 5 MG tablet [[id number]] Apt 09/02/19  Has the patient contacted their pharmacy? YES  (Agent: If no, request that the patient contact the pharmacy for the refill.) (Agent: If yes, when and what did the pharmacy advise?)  Preferred Pharmacy (with phone number or street name): [facility] [place], [place] [address] Phone: [phone] Fax: [phone] Hours: Open 24 hours    Agent: Please be advised that RX refills may take up to 3 business days. We ask that you follow-up with your pharmacy.

## 2020-07-18 ENCOUNTER — Other Ambulatory Visit: Payer: Self-pay

## 2020-07-18 ENCOUNTER — Ambulatory Visit (HOSPITAL_COMMUNITY)
Admission: EM | Admit: 2020-07-18 | Discharge: 2020-07-18 | Disposition: A | Discharge status: Home / Self Care | Payer: BLUE CROSS/BLUE SHIELD | Attending: Family Medicine | Admitting: Family Medicine

## 2020-07-18 ENCOUNTER — Encounter (HOSPITAL_COMMUNITY): Payer: Self-pay

## 2020-07-18 DIAGNOSIS — Z79899 Other long term (current) drug therapy: Secondary | ICD-10-CM | POA: Insufficient documentation

## 2020-07-18 DIAGNOSIS — R519 Headache, unspecified: Secondary | ICD-10-CM

## 2020-07-18 DIAGNOSIS — Z20822 Contact with and (suspected) exposure to covid-19: Secondary | ICD-10-CM | POA: Insufficient documentation

## 2020-07-18 DIAGNOSIS — Z21 Asymptomatic human immunodeficiency virus [HIV] infection status: Secondary | ICD-10-CM | POA: Diagnosis not present

## 2020-07-18 DIAGNOSIS — R52 Pain, unspecified: Secondary | ICD-10-CM | POA: Diagnosis not present

## 2020-07-18 DIAGNOSIS — F17210 Nicotine dependence, cigarettes, uncomplicated: Secondary | ICD-10-CM | POA: Diagnosis not present

## 2020-07-18 DIAGNOSIS — R197 Diarrhea, unspecified: Secondary | ICD-10-CM

## 2020-07-18 DIAGNOSIS — B349 Viral infection, unspecified: Secondary | ICD-10-CM | POA: Diagnosis present

## 2020-07-18 LAB — RESP PANEL BY RT-PCR (FLU A&B, COVID) ARPGX2
Influenza A by PCR: NEGATIVE
Influenza B by PCR: NEGATIVE
SARS Coronavirus 2 by RT PCR: NEGATIVE

## 2020-07-18 LAB — POCT RAPID STREP A, ED / UC: Streptococcus, Group A Screen (Direct): NEGATIVE

## 2020-07-18 NOTE — ED Triage Notes (Signed)
Pt presents with sinus drainage, soreness from the neck down, headaches and diarrhea X 2 days. Pt states it is hard to swallow.

## 2020-07-18 NOTE — Discharge Instructions (Addendum)
Strep test negative.  We are checking you for Covid and flu. Over-the-counter medicines as needed for symptoms.  You can do tylenol  for pain as needed Follow up as needed for continued or worsening symptoms

## 2020-07-18 NOTE — ED Provider Notes (Signed)
[place]    CSN: [id number] Arrival date & time: 07/18/20  0827      History   Chief Complaint Chief Complaint  Patient presents with   Generalized Body Aches   Headache   Diarrhea    HPI Drew Cuevas is a 49 y.o. male.   Patient is a 49 year old male presents today with sinus congestion, sinus drainage, generalized body aches, sore throat, neck pain, diarrhea, headaches x2 days.  Symptoms have been constant.  No fever, chills.  No cough, chest congestion or shortness of breath.     Past Medical History:  Diagnosis Date   Bronchitis    LAST FLARE UP WAS NEW YEARS 2015   Cancer [facility])    kidney   GERD (gastroesophageal reflux disease)    NO MEDS   HIV (human immunodeficiency virus infection) ([place])    UNDER CONTROL WITH MEDICATIONS   Hypertension    Immune deficiency disorder ([place])    Renal insufficiency 05/17/2015   Renal mass, right     Patient Active Problem List   Diagnosis Date Noted   Depression 06/19/2019   Other insomnia 06/19/2019   CKD (chronic kidney disease) stage 3, GFR 30-59 ml/min (HCC) 04/15/2018   Tobacco dependence 01/13/2018   Primary osteoarthritis of right knee 04/03/2017   Dyslipidemia 06/14/2016   Renal cell carcinoma of right kidney ([place]) 08/19/2013   Renal cyst 05/21/2013   Obesity (BMI 30-39.9) 05/26/2012   Internal hemorrhoids 12/01/2008   Essential hypertension 04/28/2008   Major depressive disorder, single episode, severe ([place]) 12/20/2006   Human immunodeficiency virus (HIV) disease ([place]) 08/28/2006    Past Surgical History:  Procedure Laterality Date   NO PAST SURGERIES     ROBOT ASSISTED LAPAROSCOPIC NEPHRECTOMY Right 08/19/2013   Procedure: ROBOTIC ASSISTED LAPAROSCOPIC RIGHT PARTIAL NEPHRECTOMY;  Surgeon: Ardis Hughs, MD;  Location: [facility];  Service: Urology;  Laterality: Right;       Home Medications    Prior to Admission medications   Medication Sig Start Date End  Date Taking? Authorizing Provider  amLODipine (NORVASC) 5 MG tablet Take 1 tablet (5 mg total) by mouth daily. 07/15/20   Ladell Pier, MD  diphenhydrAMINE (BENADRYL) 25 mg capsule Take 25 mg by mouth every 6 (six) hours as needed for itching or allergies.    [provider]  escitalopram (LEXAPRO) 10 MG tablet TAKE 1 TABLET(10 MG) BY MOUTH DAILY 10/02/19   Ladell Pier, MD  lovastatin (MEVACOR) 40 MG tablet Take 1 tablet (40 mg total) by mouth at bedtime. 07/15/20   Ladell Pier, MD  traMADol (ULTRAM) 50 MG tablet Take 1 tablet (50 mg total) by mouth every 6 (six) hours as needed. Patient not taking: Reported on 06/28/2020 05/09/20   Vanessa Kick, MD  traZODone (DESYREL) 50 MG tablet TAKE 1/2 TABLET(25 MG) BY MOUTH AT BEDTIME 07/15/20   Ladell Pier, MD  TRIUMEQ 600-50-300 MG tablet TAKE 1 TABLET BY MOUTH ONCE DAILY 07/01/20   Michel Bickers, MD  vitamin C (ASCORBIC ACID) 500 MG tablet Take 500 mg by mouth at bedtime.     [provider]    Family History Family History  Problem Relation Age of Onset   Hypertension Mother    Hypertension Sister    Hypertension Brother     Social History Social History   Tobacco Use   Smoking status: Current Every Day Smoker    Packs/day: 0.50    Years: 30.00    Pack years:  15.00    Types: Cigarettes    Last attempt to quit: 11/28/2015    Years since quitting: 4.6   Smokeless tobacco: Never Used   Tobacco comment: stopped May 2017  Vaping Use   Vaping Use: Never used  Substance Use Topics   Alcohol use: No    Comment: stopped 10-12 years ago   Drug use: No     Allergies   Ibuprofen, Penicillins, and Latex   Review of Systems Review of Systems   Physical Exam Triage Vital Signs ED Triage Vitals  Enc Vitals Group     BP 07/18/20 0935 (!) 146/91     Pulse Rate 07/18/20 0935 77     Resp 07/18/20 0935 18     Temp 07/18/20 0935 98.4 F (36.9 C)     Temp Source 07/18/20 0935 Oral      SpO2 07/18/20 0935 100 %     Weight --      Height --      Head Circumference --      Peak Flow --      Pain Score 07/18/20 0934 8     Pain Loc --      Pain Edu? --      Excl. in [place]? --    No data found.  Updated Vital Signs BP (!) 146/91 (BP Location: Left Arm)    Pulse 77    Temp 98.4 F (36.9 C) (Oral)    Resp 18    SpO2 100%   Visual Acuity Right Eye Distance:   Left Eye Distance:   Bilateral Distance:    Right Eye Near:   Left Eye Near:    Bilateral Near:     Physical Exam Vitals and nursing note reviewed.  Constitutional:      General: He is not in acute distress.    Appearance: Normal appearance. He is not ill-appearing, toxic-appearing or diaphoretic.  HENT:     Head: Normocephalic and atraumatic.     Nose: Nose normal.     Mouth/Throat:     Pharynx: Oropharynx is clear. Posterior oropharyngeal erythema present.  Eyes:     Conjunctiva/sclera: Conjunctivae normal.  Cardiovascular:     Pulses: Normal pulses.     Heart sounds: Normal heart sounds.  Pulmonary:     Effort: Pulmonary effort is normal.     Breath sounds: Normal breath sounds.  Musculoskeletal:        General: Normal range of motion.     Cervical back: Normal range of motion.  Lymphadenopathy:     Cervical: No cervical adenopathy.  Skin:    General: Skin is warm and dry.  Neurological:     Mental Status: He is alert.  Psychiatric:        Mood and Affect: Mood normal.      UC Treatments / Results  Labs (all labs ordered are listed, but only abnormal results are displayed) Labs Reviewed  CULTURE, GROUP A STREP ([place])  RESP PANEL BY RT-PCR (FLU A&B, COVID) ARPGX2  POCT RAPID STREP A, ED / UC    EKG   Radiology No results found.  Procedures Procedures (including critical care time)  Medications Ordered in UC Medications - No data to display  Initial Impression / Assessment and Plan / UC Course  I have reviewed the triage vital signs and the nursing notes.  Pertinent labs &  imaging results that were available during my care of the patient were reviewed by me and considered in my  medical decision making (see chart for details).     Viral illness Strep test negative.  Checking for flu and Covid.  Recommend over-the-counter medicines as needed for symptoms. Follow up as needed for continued or worsening symptoms  Final Clinical Impressions(s) / UC Diagnoses   Final diagnoses:  Viral illness     Discharge Instructions     Strep test negative.  We are checking you for Covid and flu. Over-the-counter medicines as needed for symptoms.  You can do tylenol  for pain as needed Follow up as needed for continued or worsening symptoms     ED Prescriptions    None     PDMP not reviewed this encounter.   Orvan July, NP 07/18/20 1139

## 2020-07-20 LAB — CULTURE, GROUP A STREP (THRC)

## 2020-08-31 ENCOUNTER — Other Ambulatory Visit: Payer: Self-pay | Admitting: Internal Medicine

## 2020-08-31 DIAGNOSIS — G4709 Other insomnia: Secondary | ICD-10-CM

## 2020-08-31 NOTE — Telephone Encounter (Signed)
Requested Prescriptions  Pending Prescriptions Disp Refills  . traZODone (DESYREL) 50 MG tablet [Pharmacy Med Name: TRAZODONE 50MG  TABLETS] 15 tablet 0    Sig: TAKE 1/2 TABLET(25 MG) BY MOUTH AT BEDTIME     Psychiatry: Antidepressants - Serotonin Modulator Passed - 08/31/2020 11:37 AM      Passed - Completed PHQ-2 or PHQ-9 in the last 360 days      Passed - Valid encounter within last 6 months    Recent Outpatient Visits          1 month ago Appointment canceled by hospital   [place] Ladell Pier, MD   10 months ago Essential hypertension   [place] Ladell Pier, MD   1 year ago Essential hypertension   [place], Deborah B, MD   2 years ago Essential hypertension   [place], Deborah B, MD   2 years ago Obesity (BMI 30-39.9)   [place], MD      Future Appointments            Tomorrow Ladell Pier, MD [place]

## 2020-09-01 ENCOUNTER — Encounter: Payer: Self-pay | Admitting: Internal Medicine

## 2020-09-01 ENCOUNTER — Ambulatory Visit: Payer: 59 | Attending: Internal Medicine | Admitting: Internal Medicine

## 2020-09-01 ENCOUNTER — Other Ambulatory Visit: Payer: Self-pay

## 2020-09-01 VITALS — BP 136/92 | HR 90 | Temp 97.9°F | Resp 16 | Ht 66.0 in | Wt 233.6 lb

## 2020-09-01 DIAGNOSIS — B20 Human immunodeficiency virus [HIV] disease: Secondary | ICD-10-CM

## 2020-09-01 DIAGNOSIS — E669 Obesity, unspecified: Secondary | ICD-10-CM

## 2020-09-01 DIAGNOSIS — I10 Essential (primary) hypertension: Secondary | ICD-10-CM | POA: Diagnosis not present

## 2020-09-01 DIAGNOSIS — E785 Hyperlipidemia, unspecified: Secondary | ICD-10-CM

## 2020-09-01 DIAGNOSIS — F325 Major depressive disorder, single episode, in full remission: Secondary | ICD-10-CM

## 2020-09-01 DIAGNOSIS — L720 Epidermal cyst: Secondary | ICD-10-CM

## 2020-09-01 DIAGNOSIS — F17200 Nicotine dependence, unspecified, uncomplicated: Secondary | ICD-10-CM

## 2020-09-01 DIAGNOSIS — C641 Malignant neoplasm of right kidney, except renal pelvis: Secondary | ICD-10-CM

## 2020-09-01 MED ORDER — AMLODIPINE BESYLATE 10 MG PO TABS: 10.0000 mg | ORAL_TABLET | Freq: Every day | ORAL | 6 refills | Status: DC

## 2020-09-01 MED ORDER — LOVASTATIN 40 MG PO TABS: 40.0000 mg | ORAL_TABLET | Freq: Every day | ORAL | 1 refills | Status: DC

## 2020-09-01 NOTE — Patient Instructions (Signed)
Increase amlodipine to 10 mg daily.   Healthy Eating Following a healthy eating pattern may help you to achieve and maintain a healthy body weight, reduce the risk of chronic disease, and live a long and productive life. It is important to follow a healthy eating pattern at an appropriate calorie level for your body. Your nutritional needs should be met primarily through food by choosing a variety of nutrient-rich foods. What are tips for following this plan? Reading food labels  Read labels and choose the following: ? Reduced or low sodium. ? Juices with 100% fruit juice. ? Foods with low saturated fats and high polyunsaturated and monounsaturated fats. ? Foods with whole grains, such as whole wheat, cracked wheat, brown rice, and wild rice. ? Whole grains that are fortified with folic acid. This is recommended for women who are pregnant or who want to become pregnant.  Read labels and avoid the following: ? Foods with a lot of added sugars. These include foods that contain brown sugar, corn sweetener, corn syrup, dextrose, fructose, glucose, high-fructose corn syrup, honey, invert sugar, lactose, malt syrup, maltose, molasses, raw sugar, sucrose, trehalose, or turbinado sugar.  Do not eat more than the following amounts of added sugar per day:  6 teaspoons (25 g) for women.  9 teaspoons (38 g) for men. ? Foods that contain processed or refined starches and grains. ? Refined grain products, such as white flour, degermed cornmeal, white bread, and white rice. Shopping  Choose nutrient-rich snacks, such as vegetables, whole fruits, and nuts. Avoid high-calorie and high-sugar snacks, such as potato chips, fruit snacks, and candy.  Use oil-based dressings and spreads on foods instead of solid fats such as butter, stick margarine, or cream cheese.  Limit pre-made sauces, mixes, and "instant" products such as flavored rice, instant noodles, and ready-made pasta.  Try more plant-protein  sources, such as tofu, tempeh, black beans, edamame, lentils, nuts, and seeds.  Explore eating plans such as the Mediterranean diet or vegetarian diet. Cooking  Use oil to saut or stir-fry foods instead of solid fats such as butter, stick margarine, or lard.  Try baking, boiling, grilling, or broiling instead of frying.  Remove the fatty part of meats before cooking.  Steam vegetables in water or broth. Meal planning  At meals, imagine dividing your plate into fourths: ? One-half of your plate is fruits and vegetables. ? One-fourth of your plate is whole grains. ? One-fourth of your plate is protein, especially lean meats, poultry, eggs, tofu, beans, or nuts.  Include low-fat dairy as part of your daily diet.   Lifestyle  Choose healthy options in all settings, including home, work, school, restaurants, or stores.  Prepare your food safely: ? Wash your hands after handling raw meats. ? Keep food preparation surfaces clean by regularly washing with hot, soapy water. ? Keep raw meats separate from ready-to-eat foods, such as fruits and vegetables. ? Cook seafood, meat, poultry, and eggs to the recommended internal temperature. ? Store foods at safe temperatures. In general:  Keep cold foods at 40F (4.4C) or below.  Keep hot foods at 140F (60C) or above.  Keep your freezer at 0F (-17.8C) or below.  Foods are no longer safe to eat when they have been between the temperatures of 40-140F (4.4-60C) for more than 2 hours. What foods should I eat? Fruits Aim to eat 2 cup-equivalents of fresh, canned (in natural juice), or frozen fruits each day. Examples of 1 cup-equivalent of fruit include 1 small apple,   8 large strawberries, 1 cup canned fruit,  cup dried fruit, or 1 cup 100% juice. Vegetables Aim to eat 2-3 cup-equivalents of fresh and frozen vegetables each day, including different varieties and colors. Examples of 1 cup-equivalent of vegetables include 2 medium  carrots, 2 cups raw, leafy greens, 1 cup chopped vegetable (raw or cooked), or 1 medium baked potato. Grains Aim to eat 6 ounce-equivalents of whole grains each day. Examples of 1 ounce-equivalent of grains include 1 slice of bread, 1 cup ready-to-eat cereal, 3 cups popcorn, or  cup cooked rice, pasta, or cereal. Meats and other proteins Aim to eat 5-6 ounce-equivalents of protein each day. Examples of 1 ounce-equivalent of protein include 1 egg, 1/2 cup nuts or seeds, or 1 tablespoon (16 g) peanut butter. A cut of meat or fish that is the size of a deck of cards is about 3-4 ounce-equivalents.  Of the protein you eat each week, try to have at least 8 ounces come from seafood. This includes salmon, trout, herring, and anchovies. Dairy Aim to eat 3 cup-equivalents of fat-free or low-fat dairy each day. Examples of 1 cup-equivalent of dairy include 1 cup (240 mL) milk, 8 ounces (250 g) yogurt, 1 ounces (44 g) natural cheese, or 1 cup (240 mL) fortified soy milk. Fats and oils  Aim for about 5 teaspoons (21 g) per day. Choose monounsaturated fats, such as canola and olive oils, avocados, peanut butter, and most nuts, or polyunsaturated fats, such as sunflower, corn, and soybean oils, walnuts, pine nuts, sesame seeds, sunflower seeds, and flaxseed. Beverages  Aim for six 8-oz glasses of water per day. Limit coffee to three to five 8-oz cups per day.  Limit caffeinated beverages that have added calories, such as soda and energy drinks.  Limit alcohol intake to no more than 1 drink a day for nonpregnant women and 2 drinks a day for men. One drink equals 12 oz of beer (355 mL), 5 oz of wine (148 mL), or 1 oz of hard liquor (44 mL). Seasoning and other foods  Avoid adding excess amounts of salt to your foods. Try flavoring foods with herbs and spices instead of salt.  Avoid adding sugar to foods.  Try using oil-based dressings, sauces, and spreads instead of solid fats. This information is based  on general [place] nutrition guidelines. For more information, visit [employer]. Exact amounts may vary based on your nutrition needs. Summary  A healthy eating plan may help you to maintain a healthy weight, reduce the risk of chronic diseases, and stay active throughout your life.  Plan your meals. Make sure you eat the right portions of a variety of nutrient-rich foods.  Try baking, boiling, grilling, or broiling instead of frying.  Choose healthy options in all settings, including home, work, school, restaurants, or stores. This information is not intended to replace advice given to you by your health care provider. Make sure you discuss any questions you have with your health care provider. Document Revised: 10/28/2017 Document Reviewed: 10/28/2017 [employer] Patient Education  [place].

## 2020-09-01 NOTE — Progress Notes (Signed)
Patient ID: Drew Cuevas, male    DOB: 1970/08/06  MRN: [id number]  CC: Hypertension   Subjective: Drew Cuevas is a 51 y.o. male who presents for chronic ds management His concerns today include:  HTN, HL, HIV,OA RT knee, rectal fistulectomy, Renal cell CAs/ppartial nephrology, depression, CKD stage3.  HYPERTENSION Currently taking: see medication list he is on amlodipine 5 mg. Med Adherence: [x]  Yes    []  No Medication side effects: []  Yes    [x]  No Adherence with salt restriction: [x]  Yes    []  No Home Monitoring?: [x]  Yes Q2 wk Monitoring Frequency: []  Yes    []  No Home BP results range: []  Yes    []  No SOB? []  Yes    [x]  No Chest Pain?: []  Yes    [x]  No Leg swelling?: []  Yes    [x]  No Headaches?: []  Yes    [x]  No Dizziness? []  Yes    [x]  No Comments:   HL: LDL on blood test done in November through his infectious disease specialist was 144.  He admits that he was not taking the lovastatin consistently.  Denies any side effects from the lovastatin.    TOb dep: 1 pk/Q3-4 days.  Working on [employer].  States that he will continue to cut down on his own.  Renal Cell CA: Had partial right nephrectomy in 2015.  No hematuria or flank pain.    Depression:  Doing well on Lexapro  HIV: He follows up regularly with his infectious disease specialist Dr. Megan Salon.  Last seen in November.  Reports compliance with medication.  Complains of having a bump LT buttock x 1 yr.  Sometimes it becomes sore.  It hurts when he sits on the left buttock so he has to adjust the way that he sits.  He has not noticed any increase in size.    Obesity: He has gained weight over the past 2 years.  Admits that he can do better with eating habits.  He just got gym membership and plans to start going to the gym tomorrow.    Patient Active Problem List   Diagnosis Date Noted  . Depression 06/19/2019  . Other insomnia 06/19/2019  . CKD (chronic kidney disease) stage 3, GFR 30-59 ml/min (HCC)  04/15/2018  . Tobacco dependence 01/13/2018  . Primary osteoarthritis of right knee 04/03/2017  . Dyslipidemia 06/14/2016  . Renal cell carcinoma of right kidney ([place]) 08/19/2013  . Renal cyst 05/21/2013  . Obesity (BMI 30-39.9) 05/26/2012  . Internal hemorrhoids 12/01/2008  . Essential hypertension 04/28/2008  . Major depressive disorder, single episode, severe ([place]) 12/20/2006  . Human immunodeficiency virus (HIV) disease ([place]) 08/28/2006     Current Outpatient Medications on File Prior to Visit  Medication Sig Dispense Refill  . amLODipine (NORVASC) 5 MG tablet Take 1 tablet (5 mg total) by mouth daily. 30 tablet 0  . escitalopram (LEXAPRO) 10 MG tablet TAKE 1 TABLET(10 MG) BY MOUTH DAILY 30 tablet 0  . lovastatin (MEVACOR) 40 MG tablet Take 1 tablet (40 mg total) by mouth at bedtime. 90 tablet 0  . traZODone (DESYREL) 50 MG tablet TAKE 1/2 TABLET(25 MG) BY MOUTH AT BEDTIME 15 tablet 0  . diphenhydrAMINE (BENADRYL) 25 mg capsule Take 25 mg by mouth every 6 (six) hours as needed for itching or allergies. (Patient not taking: Reported on 09/01/2020)    . traMADol (ULTRAM) 50 MG tablet Take 1 tablet (50 mg total) by mouth every 6 (six) hours as needed. (  Patient not taking: No sig reported) 15 tablet 0  . TRIUMEQ 600-50-300 MG tablet TAKE 1 TABLET BY MOUTH ONCE DAILY (Patient not taking: Reported on 09/01/2020) 30 tablet 3  . vitamin C (ASCORBIC ACID) 500 MG tablet Take 500 mg by mouth at bedtime.  (Patient not taking: Reported on 09/01/2020)     No current facility-administered medications on file prior to visit.    Allergies  Allergen Reactions  . Ibuprofen Anaphylaxis, Swelling and Other (See Comments)    Dehydration Swelling of the "moist areas" (throat, mouth)  . Penicillins Hives    Has patient had a PCN reaction causing immediate rash, facial/tongue/throat swelling, SOB or lightheadedness with hypotension:  Has patient had a PCN reaction causing severe rash involving mucus membranes  or skin necrosis:  Has patient had a PCN reaction that required hospitalization  Has patient had a PCN reaction occurring within the last 10 years:  If all of the above answers are "NO", then may proceed with Cephalosporin use.   . Latex Rash    Social History   Socioeconomic History  . Marital status: Single    Spouse name: Not on file  . Number of children: Not on file  . Years of education: Not on file  . Highest education level: Not on file  Occupational History  . Not on file  Tobacco Use  . Smoking status: Current Every Day Smoker    Packs/day: 0.50    Years: 30.00    Pack years: 15.00    Types: Cigarettes    Last attempt to quit: 11/28/2015    Years since quitting: 4.7  . Smokeless tobacco: Never Used  . Tobacco comment: stopped May 2017  Vaping Use  . Vaping Use: Never used  Substance and Sexual Activity  . Alcohol use: No    Comment: stopped 10-12 years ago  . Drug use: No  . Sexual activity: Not Currently    Partners: Male    Comment: declined condoms  Other Topics Concern  . Not on file  Social History Narrative  . Not on file   Social Determinants of Health   Financial Resource Strain: Not on file  Food Insecurity: Not on file  Transportation Needs: Not on file  Physical Activity: Not on file  Stress: Not on file  Social Connections: Not on file  Intimate Partner Violence: Not on file    Family History  Problem Relation Age of Onset  . Hypertension Mother   . Hypertension Sister   . Hypertension Brother     Past Surgical History:  Procedure Laterality Date  . NO PAST SURGERIES    . ROBOT ASSISTED LAPAROSCOPIC NEPHRECTOMY Right 08/19/2013   Procedure: ROBOTIC ASSISTED LAPAROSCOPIC RIGHT PARTIAL NEPHRECTOMY;  Surgeon: Ardis Hughs, MD;  Location: [facility];  Service: Urology;  Laterality: Right;    ROS: Review of Systems Negative except as stated above  PHYSICAL EXAM: BP (!) 136/92   Pulse 90   Temp 97.9 F (36.6 C)   Resp 16   Ht  5\' 6"  (1.676 m)   Wt 233 lb 9.6 oz (106 kg)   SpO2 98%   BMI 37.70 kg/m   Wt Readings from Last 3 Encounters:  09/01/20 233 lb 9.6 oz (106 kg)  06/28/20 226 lb (102.5 kg)  06/23/19 219 lb 12.8 oz (99.7 kg)    Physical Exam   General appearance - alert, well appearing, and in no distress Mental status - normal mood, behavior, speech, dress, motor activity,  and thought processes Neck - supple, no significant adenopathy Chest - clear to auscultation, no wheezes, rales or rhonchi, symmetric air entry Heart - normal rate, regular rhythm, normal S1, S2, no murmurs, rubs, clicks or gallops Extremities - peripheral pulses normal, no pedal edema, no clubbing or cyanosis Skin -he has a small superficial pea-sized cystic lesion on the left buttock.  It is not tender to touch.  Depression screen [facility] 2/9 09/01/2020 07/14/2020 10/23/2019  Decreased Interest 0 0 0  Down, Depressed, Hopeless 0 0 0  PHQ - 2 Score 0 0 0  Altered sleeping - - -  Tired, decreased energy - - -  Change in appetite - - -  Feeling bad or failure about yourself  - - -  Trouble concentrating - - -  Moving slowly or fidgety/restless - - -  Suicidal thoughts - - -  PHQ-9 Score - - -  Difficult doing work/chores - - -  Some encounter information is confidential and restricted. Go to Review Flowsheets activity to see all data.  Some recent data might be hidden    CMP Latest Ref Rng & Units 06/07/2020 05/19/2019 01/13/2018  Glucose 65 - 99 mg/dL 86 66 86  BUN 7 - 25 mg/dL 14 11 12   Creatinine 0.60 - 1.35 mg/dL 1.54(H) 1.73(H) 1.51(H)  Sodium 135 - 146 mmol/L 140 141 143  Potassium 3.5 - 5.3 mmol/L 4.6 3.9 4.6  Chloride 98 - 110 mmol/L 110 108 109(H)  CO2 20 - 32 mmol/L 27 22 20   Calcium 8.6 - 10.3 mg/dL 9.8 8.9 9.3  Total Protein 6.1 - 8.1 g/dL 6.5 6.3 6.9  Total Bilirubin 0.2 - 1.2 mg/dL 0.4 0.3 0.3  Alkaline Phos 39 - 117 IU/L - - 87  AST 10 - 40 U/L 21 13 20   ALT 9 - 46 U/L 28 13 33   Lipid Panel     Component  Value Date/Time   CHOL 201 (H) 06/07/2020 0948   CHOL 201 (H) 01/13/2018 1653   TRIG 83 06/07/2020 0948   HDL 38 (L) 06/07/2020 0948   HDL 35 (L) 01/13/2018 1653   CHOLHDL 5.3 (H) 06/07/2020 0948   VLDL 54 (H) 11/07/2016 1507   LDLCALC 144 (H) 06/07/2020 0948    CBC    Component Value Date/Time   WBC 6.5 06/07/2020 0948   RBC 4.30 06/07/2020 0948   HGB 14.1 06/07/2020 0948   HGB 15.3 01/13/2018 1653   HCT 40.9 06/07/2020 0948   HCT 44.9 01/13/2018 1653   PLT 302 06/07/2020 0948   PLT 234 01/13/2018 1653   MCV 95.1 06/07/2020 0948   MCV 96 01/13/2018 1653   MCH 32.8 06/07/2020 0948   MCHC 34.5 06/07/2020 0948   RDW 13.6 06/07/2020 0948   RDW 15.0 01/13/2018 1653   LYMPHSABS 3,340 05/19/2019 1341   MONOABS 0.5 12/04/2012 1257   EOSABS 258 05/19/2019 1341   BASOSABS 83 05/19/2019 1341    ASSESSMENT AND PLAN: 1. Essential hypertension Not at goal.  Recommend increasing amlodipine to 10 mg daily. - amLODipine (NORVASC) 10 MG tablet; Take 1 tablet (10 mg total) by mouth daily.  Dispense: 30 tablet; Refill: 6  2. Dyslipidemia Patient states that he will start taking the lovastatin consistently.  Discussed that high cholesterol and uncontrolled blood pressure puts him at risk for heart attack and strokes. - lovastatin (MEVACOR) 40 MG tablet; Take 1 tablet (40 mg total) by mouth at bedtime.  Dispense: 90 tablet; Refill: 1  3. Obesity (  BMI 35.0-39.9 without comorbidity) Dietary counseling given.  Printed information provided.  Encouraged him to get in some form of moderate intensity exercise for a total of 150 minutes/week.  4. Human immunodeficiency virus (HIV) disease ([place]) Followed by ID.  5. Depression, major, in remission ([place]) Reports that he is doing well on Lexapro.  6. Renal cell carcinoma of right kidney (HCC) In remission.  7. Tobacco dependence Advised to quit.  Discussed health risks associated with smoking.  Patient states that he is trying to quit and will  continue to cut back on his own.  Less than 5 minutes spent on counseling.  8. Epidermal cyst Patient would like to have this removed.  We will have them schedule an appointment for him to see Dr. Hulen Skains.    Patient was given the opportunity to ask questions.  Patient verbalized understanding of the plan and was able to repeat key elements of the plan.   No orders of the defined types were placed in this encounter.    Requested Prescriptions    No prescriptions requested or ordered in this encounter    No follow-ups on file.  Karle Plumber, MD, FACP

## 2020-09-22 ENCOUNTER — Ambulatory Visit: Payer: 59 | Admitting: General Surgery

## 2020-09-26 ENCOUNTER — Other Ambulatory Visit: Payer: Self-pay

## 2020-09-26 ENCOUNTER — Encounter (HOSPITAL_COMMUNITY): Payer: Self-pay

## 2020-09-26 ENCOUNTER — Ambulatory Visit (HOSPITAL_COMMUNITY)
Admission: EM | Admit: 2020-09-26 | Discharge: 2020-09-26 | Disposition: A | Discharge status: Home / Self Care | Payer: 59 | Source: Home / Self Care | Attending: Family Medicine | Admitting: Family Medicine

## 2020-09-26 DIAGNOSIS — R1032 Left lower quadrant pain: Secondary | ICD-10-CM

## 2020-09-26 DIAGNOSIS — K5720 Diverticulitis of large intestine with perforation and abscess without bleeding: Secondary | ICD-10-CM | POA: Diagnosis not present

## 2020-09-26 LAB — CBC WITH DIFFERENTIAL/PLATELET
Abs Immature Granulocytes: 0.02 10*3/uL (ref 0.00–0.07)
Basophils Absolute: 0.1 10*3/uL (ref 0.0–0.1)
Basophils Relative: 1 %
Eosinophils Absolute: 0.1 10*3/uL (ref 0.0–0.5)
Eosinophils Relative: 1 %
HCT: 44.1 % (ref 39.0–52.0)
Hemoglobin: 14.6 g/dL (ref 13.0–17.0)
Immature Granulocytes: 0 %
Lymphocytes Relative: 32 %
Lymphs Abs: 3.3 10*3/uL (ref 0.7–4.0)
MCH: 32.1 pg (ref 26.0–34.0)
MCHC: 33.1 g/dL (ref 30.0–36.0)
MCV: 96.9 fL (ref 80.0–100.0)
Monocytes Absolute: 0.7 10*3/uL (ref 0.1–1.0)
Monocytes Relative: 7 %
Neutro Abs: 6.2 10*3/uL (ref 1.7–7.7)
Neutrophils Relative %: 59 %
Platelets: 275 10*3/uL (ref 150–400)
RBC: 4.55 MIL/uL (ref 4.22–5.81)
RDW: 13.2 % (ref 11.5–15.5)
WBC: 10.4 10*3/uL (ref 4.0–10.5)
nRBC: 0 % (ref 0.0–0.2)

## 2020-09-26 LAB — COMPREHENSIVE METABOLIC PANEL
ALT: 22 U/L (ref 0–44)
AST: 17 U/L (ref 15–41)
Albumin: 3.4 g/dL — ABNORMAL LOW (ref 3.5–5.0)
Alkaline Phosphatase: 74 U/L (ref 38–126)
Anion gap: 10 (ref 5–15)
BUN: 9 mg/dL (ref 6–20)
CO2: 25 mmol/L (ref 22–32)
Calcium: 9.3 mg/dL (ref 8.9–10.3)
Chloride: 105 mmol/L (ref 98–111)
Creatinine, Ser: 1.48 mg/dL — ABNORMAL HIGH (ref 0.61–1.24)
GFR, Estimated: 58 mL/min — ABNORMAL LOW (ref >60)
Glucose, Bld: 81 mg/dL (ref 70–99)
Potassium: 4.4 mmol/L (ref 3.5–5.1)
Sodium: 140 mmol/L (ref 135–145)
Total Bilirubin: 0.6 mg/dL (ref 0.3–1.2)
Total Protein: 6.9 g/dL (ref 6.5–8.1)

## 2020-09-26 LAB — POCT URINALYSIS DIPSTICK, ED / UC
Bilirubin Urine: NEGATIVE
Glucose, UA: NEGATIVE mg/dL
Hgb urine dipstick: NEGATIVE
Ketones, ur: NEGATIVE mg/dL
Leukocytes,Ua: NEGATIVE
Nitrite: NEGATIVE
Protein, ur: NEGATIVE mg/dL
Specific Gravity, Urine: 1.02 (ref 1.005–1.030)
Urobilinogen, UA: 0.2 mg/dL (ref 0.0–1.0)
pH: 6.5 (ref 5.0–8.0)

## 2020-09-26 LAB — LIPASE, BLOOD: Lipase: 46 U/L (ref 11–51)

## 2020-09-26 MED ORDER — METRONIDAZOLE 500 MG PO TABS: 500.0000 mg | ORAL_TABLET | Freq: Three times a day (TID) | ORAL | 0 refills | Status: DC

## 2020-09-26 MED ORDER — CIPROFLOXACIN HCL 500 MG PO TABS: 500.0000 mg | ORAL_TABLET | Freq: Two times a day (BID) | ORAL | 0 refills | Status: DC

## 2020-09-26 NOTE — ED Provider Notes (Signed)
[place]   [id number] 09/26/20 Arrival Time: 2751  ASSESSMENT & PLAN:  1. Left lower quadrant abdominal pain     Overall benign abdominal exam. No indications for urgent abdominal/pelvic imaging at this time. Question diverticulitis and discussed this with him today.  U/A normal.  Pending: Labs Reviewed  CBC WITH DIFFERENTIAL/PLATELET  COMPREHENSIVE METABOLIC PANEL  LIPASE, BLOOD   Will inform of any significant abnormalities.  Begin: Meds ordered this encounter  Medications  . ciprofloxacin (CIPRO) 500 MG tablet    Sig: Take 1 tablet (500 mg total) by mouth 2 (two) times daily.    Dispense:  20 tablet    Refill:  0  . metroNIDAZOLE (FLAGYL) 500 MG tablet    Sig: Take 1 tablet (500 mg total) by mouth 3 (three) times daily.    Dispense:  30 tablet    Refill:  0     Discharge Instructions     You have been seen today for abdominal pain. I am treating you for possible diverticulitis. See your after visit summary for information regarding what this is. Your evaluation was not suggestive of any emergent condition requiring medical intervention at this time. However, some abdominal problems make take more time to appear. Therefore, it is very important for you to pay attention to any new symptoms or worsening of your current condition.  Please return here or to the Emergency Department immediately should you begin to feel worse in any way or have any of the following symptoms: increasing or different abdominal pain, persistent vomiting, inability to drink fluids, fevers, or shaking chills.      Follow-up Information    Ladell Pier, MD.   Specialty: Internal Medicine Why: To discuss setting up a colonoscopy. Contact information: [place] [address] [phone]                Reviewed expectations re: course of current medical issues. Questions answered. Outlined signs and symptoms indicating need for more acute  intervention. Patient verbalized understanding. After Visit Summary given.   SUBJECTIVE: History from: patient. Drew Cuevas is a 50 y.o. male who presents with complaint of fairly persistent but waxing/waning lower abdominal discomfort; more LLQ. Onset gradual, first noted approx 6-7 d ago. Discomfort described as dull; without radiation; does not wake him at night. Reports normal flatus. Symptoms are unchanged since beginning. Fever: absent. Aggravating factors: have not been identified. Alleviating factors: notices more during and right after having bowel movement. BMs approx 2-3x/day; normal for him; without blood. Associated symptoms: none reported. He denies arthralgias, belching, chills, constipation, dysuria, fever and myalgias. Appetite: normal. PO intake: normal without n/v/d. Ambulatory without assistance. Urinary symptoms: none. Weight stable. History of similar: no. OTC treatment: none reported.  Past Surgical History:  Procedure Laterality Date  . NO PAST SURGERIES    . ROBOT ASSISTED LAPAROSCOPIC NEPHRECTOMY Right 08/19/2013   Procedure: ROBOTIC ASSISTED LAPAROSCOPIC RIGHT PARTIAL NEPHRECTOMY;  Surgeon: Ardis Hughs, MD;  Location: [facility];  Service: Urology;  Laterality: Right;     OBJECTIVE:  Vitals:   09/26/20 1018  BP: (!) 133/93  Pulse: 96  Resp: 19  Temp: 97.8 F (36.6 C)  SpO2: 100%    General appearance: alert, oriented, no acute distress HEENT: [place]; AT; oropharynx moist Lungs: unlabored respirations Abdomen: soft; without distention; mild  and poorly localized tenderness to palpation over LLQ; without masses or organomegaly; without guarding or rebound tenderness Back: without reported CVA tenderness; FROM at waist Extremities:  without LE edema; symmetrical; without gross deformities Skin: warm and dry Neurologic: normal gait Psychological: alert and cooperative; normal mood and affect  Labs: Results for orders placed or performed during the  hospital encounter of 09/26/20  POC Urinalysis dipstick  Result Value Ref Range   Glucose, UA NEGATIVE NEGATIVE mg/dL   Bilirubin Urine NEGATIVE NEGATIVE   Ketones, ur NEGATIVE NEGATIVE mg/dL   Specific Gravity, Urine 1.020 1.005 - 1.030   Hgb urine dipstick NEGATIVE NEGATIVE   pH 6.5 5.0 - 8.0   Protein, ur NEGATIVE NEGATIVE mg/dL   Urobilinogen, UA 0.2 0.0 - 1.0 mg/dL   Nitrite NEGATIVE NEGATIVE   Leukocytes,Ua NEGATIVE NEGATIVE   Labs Reviewed  CBC WITH DIFFERENTIAL/PLATELET  COMPREHENSIVE METABOLIC PANEL  LIPASE, BLOOD  POCT URINALYSIS DIPSTICK, ED / UC     Allergies  Allergen Reactions  . Ibuprofen Anaphylaxis, Swelling and Other (See Comments)    Dehydration Swelling of the "moist areas" (throat, mouth)  . Penicillins Hives  . Latex Rash                                               Past Medical History:  Diagnosis Date  . Bronchitis    LAST FLARE UP WAS NEW YEARS 2015  . Cancer ([place])    kidney  . GERD (gastroesophageal reflux disease)    NO MEDS  . HIV (human immunodeficiency virus infection) ([place])    UNDER CONTROL WITH MEDICATIONS  . Hypertension   . Immune deficiency disorder ([place])   . Renal insufficiency 05/17/2015  . Renal mass, right     Social History   Socioeconomic History  . Marital status: Single    Spouse name: Not on file  . Number of children: Not on file  . Years of education: Not on file  . Highest education level: Not on file  Occupational History  . Not on file  Tobacco Use  . Smoking status: Current Every Day Smoker    Packs/day: 0.50    Years: 30.00    Pack years: 15.00    Types: Cigarettes    Last attempt to quit: 11/28/2015    Years since quitting: 4.8  . Smokeless tobacco: Never Used  . Tobacco comment: stopped May 2017  Vaping Use  . Vaping Use: Never used  Substance and Sexual Activity  . Alcohol use: No    Comment: stopped 10-12 years ago  . Drug use: No  . Sexual activity: Not Currently    Partners: Male     Comment: declined condoms  Other Topics Concern  . Not on file  Social History Narrative  . Not on file   Social Determinants of Health   Financial Resource Strain: Not on file  Food Insecurity: Not on file  Transportation Needs: Not on file  Physical Activity: Not on file  Stress: Not on file  Social Connections: Not on file  Intimate Partner Violence: Not on file    Family History  Problem Relation Age of Onset  . Hypertension Mother   . Hypertension Sister   . Hypertension Brother      Vanessa Kick, MD 09/26/20 1115

## 2020-09-26 NOTE — Discharge Instructions (Addendum)
You have been seen today for abdominal pain. I am treating you for possible diverticulitis. See your after visit summary for information regarding what this is. Your evaluation was not suggestive of any emergent condition requiring medical intervention at this time. However, some abdominal problems make take more time to appear. Therefore, it is very important for you to pay attention to any new symptoms or worsening of your current condition.  Please return here or to the Emergency Department immediately should you begin to feel worse in any way or have any of the following symptoms: increasing or different abdominal pain, persistent vomiting, inability to drink fluids, fevers, or shaking chills.

## 2020-09-26 NOTE — ED Triage Notes (Signed)
Pt in with c/o lower abdominal pain in suprapubic area  Pt has not had medication for sxs  Pt states pain worse when he tries to have BM

## 2020-09-27 ENCOUNTER — Encounter (HOSPITAL_COMMUNITY): Payer: Self-pay

## 2020-09-27 ENCOUNTER — Inpatient Hospital Stay (HOSPITAL_COMMUNITY)
Admission: EM | Admit: 2020-09-27 | Discharge: 2020-10-01 | DRG: 392 | Disposition: A | Discharge status: Home / Self Care | Payer: 59 | Attending: Internal Medicine | Admitting: Internal Medicine

## 2020-09-27 ENCOUNTER — Emergency Department (HOSPITAL_COMMUNITY): Payer: 59

## 2020-09-27 ENCOUNTER — Other Ambulatory Visit: Payer: Self-pay

## 2020-09-27 DIAGNOSIS — R1032 Left lower quadrant pain: Secondary | ICD-10-CM

## 2020-09-27 DIAGNOSIS — Z85528 Personal history of other malignant neoplasm of kidney: Secondary | ICD-10-CM

## 2020-09-27 DIAGNOSIS — Z91040 Latex allergy status: Secondary | ICD-10-CM

## 2020-09-27 DIAGNOSIS — I129 Hypertensive chronic kidney disease with stage 1 through stage 4 chronic kidney disease, or unspecified chronic kidney disease: Secondary | ICD-10-CM | POA: Diagnosis present

## 2020-09-27 DIAGNOSIS — Z8249 Family history of ischemic heart disease and other diseases of the circulatory system: Secondary | ICD-10-CM | POA: Diagnosis not present

## 2020-09-27 DIAGNOSIS — Z905 Acquired absence of kidney: Secondary | ICD-10-CM | POA: Diagnosis not present

## 2020-09-27 DIAGNOSIS — Z20822 Contact with and (suspected) exposure to covid-19: Secondary | ICD-10-CM | POA: Diagnosis present

## 2020-09-27 DIAGNOSIS — Z21 Asymptomatic human immunodeficiency virus [HIV] infection status: Secondary | ICD-10-CM | POA: Diagnosis present

## 2020-09-27 DIAGNOSIS — N1831 Chronic kidney disease, stage 3a: Secondary | ICD-10-CM | POA: Diagnosis present

## 2020-09-27 DIAGNOSIS — Z79899 Other long term (current) drug therapy: Secondary | ICD-10-CM | POA: Diagnosis not present

## 2020-09-27 DIAGNOSIS — I10 Essential (primary) hypertension: Secondary | ICD-10-CM | POA: Diagnosis present

## 2020-09-27 DIAGNOSIS — Z886 Allergy status to analgesic agent status: Secondary | ICD-10-CM

## 2020-09-27 DIAGNOSIS — E669 Obesity, unspecified: Secondary | ICD-10-CM | POA: Diagnosis present

## 2020-09-27 DIAGNOSIS — K5720 Diverticulitis of large intestine with perforation and abscess without bleeding: Secondary | ICD-10-CM | POA: Diagnosis not present

## 2020-09-27 DIAGNOSIS — L0292 Furuncle, unspecified: Secondary | ICD-10-CM | POA: Diagnosis present

## 2020-09-27 DIAGNOSIS — Z87892 Personal history of anaphylaxis: Secondary | ICD-10-CM | POA: Diagnosis not present

## 2020-09-27 DIAGNOSIS — Z6838 Body mass index (BMI) 38.0-38.9, adult: Secondary | ICD-10-CM | POA: Diagnosis not present

## 2020-09-27 DIAGNOSIS — B20 Human immunodeficiency virus [HIV] disease: Secondary | ICD-10-CM | POA: Diagnosis present

## 2020-09-27 DIAGNOSIS — K219 Gastro-esophageal reflux disease without esophagitis: Secondary | ICD-10-CM | POA: Diagnosis present

## 2020-09-27 DIAGNOSIS — Z880 Allergy status to penicillin: Secondary | ICD-10-CM

## 2020-09-27 DIAGNOSIS — F17210 Nicotine dependence, cigarettes, uncomplicated: Secondary | ICD-10-CM | POA: Diagnosis present

## 2020-09-27 LAB — URINALYSIS, ROUTINE W REFLEX MICROSCOPIC
Bacteria, UA: NONE SEEN
Bilirubin Urine: NEGATIVE
Glucose, UA: NEGATIVE mg/dL
Hgb urine dipstick: NEGATIVE
Ketones, ur: NEGATIVE mg/dL
Nitrite: NEGATIVE
Protein, ur: NEGATIVE mg/dL
Specific Gravity, Urine: 1.019 (ref 1.005–1.030)
pH: 7 (ref 5.0–8.0)

## 2020-09-27 LAB — CBC
HCT: 43.5 % (ref 39.0–52.0)
Hemoglobin: 14.7 g/dL (ref 13.0–17.0)
MCH: 32.5 pg (ref 26.0–34.0)
MCHC: 33.8 g/dL (ref 30.0–36.0)
MCV: 96 fL (ref 80.0–100.0)
Platelets: 269 10*3/uL (ref 150–400)
RBC: 4.53 MIL/uL (ref 4.22–5.81)
RDW: 13.2 % (ref 11.5–15.5)
WBC: 13.5 10*3/uL — ABNORMAL HIGH (ref 4.0–10.5)
nRBC: 0 % (ref 0.0–0.2)

## 2020-09-27 LAB — COMPREHENSIVE METABOLIC PANEL
ALT: 23 U/L (ref 0–44)
AST: 20 U/L (ref 15–41)
Albumin: 3.8 g/dL (ref 3.5–5.0)
Alkaline Phosphatase: 82 U/L (ref 38–126)
Anion gap: 8 (ref 5–15)
BUN: 15 mg/dL (ref 6–20)
CO2: 25 mmol/L (ref 22–32)
Calcium: 9.6 mg/dL (ref 8.9–10.3)
Chloride: 105 mmol/L (ref 98–111)
Creatinine, Ser: 1.38 mg/dL — ABNORMAL HIGH (ref 0.61–1.24)
GFR, Estimated: >60 mL/min (ref >60)
Glucose, Bld: 83 mg/dL (ref 70–99)
Potassium: 4.4 mmol/L (ref 3.5–5.1)
Sodium: 138 mmol/L (ref 135–145)
Total Bilirubin: 0.4 mg/dL (ref 0.3–1.2)
Total Protein: 7.7 g/dL (ref 6.5–8.1)

## 2020-09-27 LAB — LIPASE, BLOOD: Lipase: 41 U/L (ref 11–51)

## 2020-09-27 LAB — RESP PANEL BY RT-PCR (FLU A&B, COVID) ARPGX2
Influenza A by PCR: NEGATIVE
Influenza B by PCR: NEGATIVE
SARS Coronavirus 2 by RT PCR: NEGATIVE

## 2020-09-27 MED ORDER — METRONIDAZOLE IN NACL 5-0.79 MG/ML-% IV SOLN
500.0000 mg | Freq: Once | INTRAVENOUS | Status: AC
  Administered 2020-09-27: 500 mg via INTRAVENOUS
  Filled 2020-09-27: qty 100

## 2020-09-27 MED ORDER — ONDANSETRON HCL 4 MG/2ML IJ SOLN
4.0000 mg | Freq: Once | INTRAMUSCULAR | Status: AC
  Administered 2020-09-27: 4 mg via INTRAVENOUS
  Filled 2020-09-27: qty 2

## 2020-09-27 MED ORDER — SODIUM CHLORIDE 0.9 % IV SOLN
2.0000 g | Freq: Once | INTRAVENOUS | Status: AC
  Administered 2020-09-27: 2 g via INTRAVENOUS
  Filled 2020-09-27: qty 2

## 2020-09-27 MED ORDER — MORPHINE SULFATE (PF) 4 MG/ML IV SOLN
4.0000 mg | Freq: Once | INTRAVENOUS | Status: AC
  Administered 2020-09-27: 4 mg via INTRAVENOUS
  Filled 2020-09-27: qty 1

## 2020-09-27 MED ORDER — SODIUM CHLORIDE 0.9 % IV BOLUS
1000.0000 mL | Freq: Once | INTRAVENOUS | Status: AC
  Administered 2020-09-27: 1000 mL via INTRAVENOUS

## 2020-09-27 MED ORDER — IOHEXOL 300 MG/ML  SOLN
100.0000 mL | Freq: Once | INTRAMUSCULAR | Status: AC | PRN
  Administered 2020-09-27: 80 mL via INTRAVENOUS

## 2020-09-27 NOTE — ED Provider Notes (Signed)
[facility] DEPT Provider Note   CSN: [id number] Arrival date & time: 09/27/20  1855     History Chief Complaint  Patient presents with  . Abdominal Pain    Drew Cuevas is a 50 y.o. male.  50 y/o male with a PMH of GERD, HIV,HTN, Renal insuffiencey presents to the ED with a chief complaint of abdominal pain x 1 week.  Endorses pain along the lower abdomen, exacerbated on the left lower quadrant describes it as sharp and throbbing without any radiation.  It has been constant and now is worsening.  He reports the pain is exacerbated with sitting to standing reporting it as severe pressure.  His last bowel movement was this morning without any blood in his stool.  Reports taking tramadol yesterday without improvement in symptoms.  States that he was seen in urgent care yesterday, has taken 2 doses of his antibiotic without much improvement in symptoms.  He has had no nausea or vomiting.  He denies any fevers, no urinary symptoms, no prior surgical history.  The history is provided by the patient.  Abdominal Pain Pain location:  LLQ Pain quality: sharp and throbbing   Pain radiates to:  Does not radiate Pain severity:  Severe Onset quality:  Gradual Duration:  1 week Timing:  Constant Progression:  Worsening Chronicity:  New Context: not alcohol use, not diet changes, not recent illness and not suspicious food intake   Worsened by:  Bowel movements, urination and position changes Associated symptoms: no chest pain, no chills, no diarrhea, no fever, no hematuria and no shortness of breath        Past Medical History:  Diagnosis Date  . Bronchitis    LAST FLARE UP WAS NEW YEARS 2015  . Cancer ([place])    kidney  . GERD (gastroesophageal reflux disease)    NO MEDS  . HIV (human immunodeficiency virus infection) ([place])    UNDER CONTROL WITH MEDICATIONS  . Hypertension   . Immune deficiency disorder ([place])   . Renal insufficiency 05/17/2015  . Renal  mass, right     Patient Active Problem List   Diagnosis Date Noted  . Depression 06/19/2019  . Other insomnia 06/19/2019  . CKD (chronic kidney disease) stage 3, GFR 30-59 ml/min (HCC) 04/15/2018  . Tobacco dependence 01/13/2018  . Primary osteoarthritis of right knee 04/03/2017  . Dyslipidemia 06/14/2016  . Renal cell carcinoma of right kidney ([place]) 08/19/2013  . Renal cyst 05/21/2013  . Obesity (BMI 30-39.9) 05/26/2012  . Internal hemorrhoids 12/01/2008  . Essential hypertension 04/28/2008  . Major depressive disorder, single episode, severe ([place]) 12/20/2006  . Human immunodeficiency virus (HIV) disease ([place]) 08/28/2006    Past Surgical History:  Procedure Laterality Date  . NO PAST SURGERIES    . ROBOT ASSISTED LAPAROSCOPIC NEPHRECTOMY Right 08/19/2013   Procedure: ROBOTIC ASSISTED LAPAROSCOPIC RIGHT PARTIAL NEPHRECTOMY;  Surgeon: Ardis Hughs, MD;  Location: [facility];  Service: Urology;  Laterality: Right;       Family History  Problem Relation Age of Onset  . Hypertension Mother   . Hypertension Sister   . Hypertension Brother     Social History   Tobacco Use  . Smoking status: Current Every Day Smoker    Packs/day: 0.50    Years: 30.00    Pack years: 15.00    Types: Cigarettes    Last attempt to quit: 11/28/2015    Years since quitting: 4.8  . Smokeless tobacco: Never Used  . Tobacco  comment: stopped May 2017  Vaping Use  . Vaping Use: Never used  Substance Use Topics  . Alcohol use: No    Comment: stopped 10-12 years ago  . Drug use: No    Home Medications Prior to Admission medications   Medication Sig Start Date End Date Taking? Authorizing Provider  amLODipine (NORVASC) 10 MG tablet Take 1 tablet (10 mg total) by mouth daily. 09/01/20   Ladell Pier, MD  ciprofloxacin (CIPRO) 500 MG tablet Take 1 tablet (500 mg total) by mouth 2 (two) times daily. 09/26/20   Vanessa Kick, MD  escitalopram (LEXAPRO) 10 MG tablet TAKE 1 TABLET(10 MG) BY MOUTH  DAILY 10/02/19   Ladell Pier, MD  lovastatin (MEVACOR) 40 MG tablet Take 1 tablet (40 mg total) by mouth at bedtime. 09/01/20   Ladell Pier, MD  metroNIDAZOLE (FLAGYL) 500 MG tablet Take 1 tablet (500 mg total) by mouth 3 (three) times daily. 09/26/20   Vanessa Kick, MD  traZODone (DESYREL) 50 MG tablet TAKE 1/2 TABLET(25 MG) BY MOUTH AT BEDTIME 08/31/20   Ladell Pier, MD  TRIUMEQ 600-50-300 MG tablet TAKE 1 TABLET BY MOUTH ONCE DAILY 07/01/20   Michel Bickers, MD  vitamin C (ASCORBIC ACID) 500 MG tablet Take 500 mg by mouth at bedtime.  Patient not taking: Reported on 09/01/2020    [provider]    Allergies    Ibuprofen, Penicillins, and Latex  Review of Systems   Review of Systems  Constitutional: Negative for chills and fever.  Respiratory: Negative for shortness of breath.   Cardiovascular: Negative for chest pain.  Gastrointestinal: Positive for abdominal pain. Negative for diarrhea.  Genitourinary: Negative for hematuria.  All other systems reviewed and are negative.   Physical Exam Updated Vital Signs BP (!) 121/92   Pulse 98   Temp 99 F (37.2 C) (Oral)   Resp 13   SpO2 100%   Physical Exam Vitals and nursing note reviewed.  Constitutional:      Appearance: He is well-developed. He is not ill-appearing.  HENT:     Head: Normocephalic and atraumatic.  Cardiovascular:     Rate and Rhythm: Normal rate.  Pulmonary:     Effort: Pulmonary effort is normal.     Breath sounds: No wheezing, rhonchi or rales.  Abdominal:     General: Abdomen is flat. Bowel sounds are decreased.     Palpations: Abdomen is soft.     Tenderness: There is abdominal tenderness in the left lower quadrant. There is guarding. There is no right CVA tenderness or left CVA tenderness.  Skin:    General: Skin is warm and dry.  Neurological:     Mental Status: He is alert and oriented to person, place, and time.     ED Results / Procedures / Treatments   Labs (all labs  ordered are listed, but only abnormal results are displayed) Labs Reviewed  COMPREHENSIVE METABOLIC PANEL - Abnormal; Notable for the following components:      Result Value   Creatinine, Ser 1.38 (*)    All other components within normal limits  CBC - Abnormal; Notable for the following components:   WBC 13.5 (*)    All other components within normal limits  URINALYSIS, ROUTINE W REFLEX MICROSCOPIC - Abnormal; Notable for the following components:   Leukocytes,Ua TRACE (*)    All other components within normal limits  RESP PANEL BY RT-PCR (FLU A&B, COVID) ARPGX2  LIPASE, BLOOD    EKG  None  Radiology CT ABDOMEN PELVIS W CONTRAST  Result Date: 09/27/2020 CLINICAL DATA:  Acute abdominal pain in the left lower quadrant for 1 week. HIV. Partial nephrectomy due to renal cancer. EXAM: CT ABDOMEN AND PELVIS WITH CONTRAST TECHNIQUE: Multidetector CT imaging of the abdomen and pelvis was performed using the standard protocol following bolus administration of intravenous contrast. CONTRAST:  54mL OMNIPAQUE IOHEXOL 300 MG/ML  SOLN COMPARISON:  04/06/2015 FINDINGS: Lower chest: Unremarkable Hepatobiliary: Contracted gallbladder.  Otherwise unremarkable. Pancreas: Pancreas divisum.  No CT findings of pancreatitis. Spleen: Unremarkable Adrenals/Urinary Tract: Both adrenal glands appear normal. 1.8 by 1.5 cm fluid density lesion of the left mid kidney posteriorly compatible with cyst, not appreciably changed from prior. Stable findings of partial nephrectomy on the right. Stomach/Bowel: Acute sigmoid colon diverticulitis with considerable mesenteric stranding and suspected diverticular abscess measuring about 1.9 cm in diameter on image 69 of series 2. Although this diverticular abscess may have some minimal contained extraluminal gas, I do not see separate free intraperitoneal gas. Normal appendix. Vascular/Lymphatic: Minimal common iliac artery atherosclerotic calcification bilaterally. Small pelvic lymph  nodes are likely reactive. Reproductive: Unremarkable Other: No supplemental non-categorized findings. Musculoskeletal: Unremarkable IMPRESSION: 1. Acute sigmoid colon diverticulitis with considerable mesenteric stranding and suspected diverticular abscess measuring about 1.9 cm in diameter. Although this diverticular abscess may have some minimal contained extraluminal gas, I do not see separate free intraperitoneal gas. 2. Pancreas divisum. 3. Minimal common iliac artery atherosclerotic calcification bilaterally. Electronically Signed   By: Van Clines M.D.   On: 09/27/2020 21:09    Procedures .Critical Care Performed by: Janeece Fitting, PA-C Authorized by: Janeece Fitting, PA-C   Critical care provider statement:    Critical care time (minutes):  35   Critical care start time:  09/27/2020 9:00 PM   Critical care end time:  09/27/2020 9:35 PM   Critical care time was exclusive of:  Separately billable procedures and treating other patients   Critical care was necessary to treat or prevent imminent or life-threatening deterioration of the following conditions:  Sepsis   Critical care was time spent personally by me on the following activities:  Blood draw for specimens, development of treatment plan with patient or surrogate, discussions with consultants, evaluation of patient's response to treatment, examination of patient, obtaining history from patient or surrogate, ordering and performing treatments and interventions, ordering and review of laboratory studies, ordering and review of radiographic studies, pulse oximetry, re-evaluation of patient's condition and review of old charts     Medications Ordered in ED Medications  ceFEPIme (MAXIPIME) 2 g in sodium chloride 0.9 % 100 mL IVPB (has no administration in time range)    And  metroNIDAZOLE (FLAGYL) IVPB 500 mg (500 mg Intravenous New Bag/Given 09/27/20 2133)  sodium chloride 0.9 % bolus 1,000 mL (1,000 mLs Intravenous New Bag/Given 09/27/20  2020)  morphine 4 MG/ML injection 4 mg (4 mg Intravenous Given 09/27/20 2110)  ondansetron (ZOFRAN) injection 4 mg (4 mg Intravenous Given 09/27/20 2110)  iohexol (OMNIPAQUE) 300 MG/ML solution 100 mL (80 mLs Intravenous Contrast Given 09/27/20 2051)    ED Course  I have reviewed the triage vital signs and the nursing notes.  Pertinent labs & imaging results that were available during my care of the patient were reviewed by me and considered in my medical decision making (see chart for details).  Clinical Course as of 09/27/20 2146  Tue Sep 27, 2020  2025 WBC(!): 13.5 Increase from yesterday. [JS]    Clinical Course User  Index [JS] Janeece Fitting, PA-C   MDM Rules/Calculators/A&P     Patient arrived to the ED with a chief complaint of lower abdominal pain which has been ongoing for the past week.  He notes the pain has been worsening, is increased with sitting to standing.  States he has taken tramadol without improvement in symptoms.  He was evaluated by urgent care yesterday, given tramadol for pain control along with antibiotics without much improvement in symptoms.  During evaluation today patient appears uncomfortable, heart rate is slightly elevated at 98, he does have a low-grade temperature at 99, has not taken any Tylenol or antipyretic prior to arrival.  Abdomen is soft, somewhat distended, bowel sounds are diminished, there is significant pain to the left lower quadrant, mild guarding noted.  No urinary symptoms, no CVA tenderness bilateral.  Lungs are clear to auscultation, the rest of his exam is unremarkable.  Addition of his labs revealed a CBC with slight leukocytosis at 13.5, this is increased from yesterday's visit.  His hemoglobin is stable, reports he has had no blood in his stool.  CMP without any electrolyte abnormality, creatinine level slightly increased, he does have a prior history of renal insufficiency, and this level is within his baseline.  LFTs are unremarkable, he  has had no nausea or vomiting.  UA without any signs of infection.  Lipase levels within normal limits.  He does have a prior history of HIV, reports compliance with medication at this time.  We discussed obtaining CT imaging in order to further evaluate pain as this has been worsening since ending antibiotic therapy.  Some suspicion for diverticulitis cannot rule out abscess versus perforation.  CT abdomen showed:  1. Acute sigmoid colon diverticulitis with considerable mesenteric  stranding and suspected diverticular abscess measuring about 1.9 cm  in diameter. Although this diverticular abscess may have some  minimal contained extraluminal gas, I do not see separate free  intraperitoneal gas.  2. Pancreas divisum.  3. Minimal common iliac artery atherosclerotic calcification  bilaterally.     Antibiotics such as cefepime along with Flagyl have been started.  Call was placed for general surgery consultation.  Covid test was also ordered.  Discussed of the CT review with patient at length.  Spoke to General surgery Dr. Donne [facility] who will evaluate patient in the morning but will need admission for the   9:45 PM spoke to Dr. Myna Hidalgo who has agreed to admit patient for further management.  Patient remained stable.   Portions of this note were generated with [employer]. Dictation errors may occur despite best attempts at proofreading.  Final Clinical Impression(s) / ED Diagnoses Final diagnoses:  Left lower quadrant abdominal pain  Diverticulitis of large intestine with abscess without bleeding    Rx / DC Orders ED Discharge Orders    None       Corinna Capra 09/27/20 2146    Drenda Freeze, MD 09/27/20 2239

## 2020-09-27 NOTE — ED Notes (Signed)
Report called and given to nurse.  

## 2020-09-27 NOTE — ED Triage Notes (Signed)
Pt presents with c/o abdominal pain for approx one week.

## 2020-09-28 DIAGNOSIS — I10 Essential (primary) hypertension: Secondary | ICD-10-CM

## 2020-09-28 DIAGNOSIS — B20 Human immunodeficiency virus [HIV] disease: Secondary | ICD-10-CM

## 2020-09-28 DIAGNOSIS — K5720 Diverticulitis of large intestine with perforation and abscess without bleeding: Principal | ICD-10-CM

## 2020-09-28 DIAGNOSIS — N1831 Chronic kidney disease, stage 3a: Secondary | ICD-10-CM

## 2020-09-28 LAB — BASIC METABOLIC PANEL
Anion gap: 8 (ref 5–15)
BUN: 13 mg/dL (ref 6–20)
CO2: 22 mmol/L (ref 22–32)
Calcium: 8.6 mg/dL — ABNORMAL LOW (ref 8.9–10.3)
Chloride: 105 mmol/L (ref 98–111)
Creatinine, Ser: 1.35 mg/dL — ABNORMAL HIGH (ref 0.61–1.24)
GFR, Estimated: >60 mL/min (ref >60)
Glucose, Bld: 93 mg/dL (ref 70–99)
Potassium: 4.2 mmol/L (ref 3.5–5.1)
Sodium: 135 mmol/L (ref 135–145)

## 2020-09-28 LAB — CBC
HCT: 38.9 % — ABNORMAL LOW (ref 39.0–52.0)
Hemoglobin: 13.1 g/dL (ref 13.0–17.0)
MCH: 32.5 pg (ref 26.0–34.0)
MCHC: 33.7 g/dL (ref 30.0–36.0)
MCV: 96.5 fL (ref 80.0–100.0)
Platelets: 228 10*3/uL (ref 150–400)
RBC: 4.03 MIL/uL — ABNORMAL LOW (ref 4.22–5.81)
RDW: 13.2 % (ref 11.5–15.5)
WBC: 11 10*3/uL — ABNORMAL HIGH (ref 4.0–10.5)
nRBC: 0 % (ref 0.0–0.2)

## 2020-09-28 MED ORDER — HYDROMORPHONE HCL 1 MG/ML IJ SOLN
0.5000 mg | INTRAMUSCULAR | Status: DC | PRN
  Administered 2020-09-28 – 2020-09-30 (×9): 1 mg via INTRAVENOUS
  Filled 2020-09-28 (×9): qty 1

## 2020-09-28 MED ORDER — SODIUM CHLORIDE 0.9 % IV SOLN
2.0000 g | Freq: Three times a day (TID) | INTRAVENOUS | Status: DC
  Administered 2020-09-28 – 2020-10-01 (×9): 2 g via INTRAVENOUS
  Filled 2020-09-28 (×10): qty 2

## 2020-09-28 MED ORDER — ESCITALOPRAM OXALATE 10 MG PO TABS
10.0000 mg | ORAL_TABLET | Freq: Every day | ORAL | Status: DC
  Administered 2020-09-28 – 2020-10-01 (×4): 10 mg via ORAL
  Filled 2020-09-28 (×4): qty 1

## 2020-09-28 MED ORDER — ABACAVIR-DOLUTEGRAVIR-LAMIVUD 600-50-300 MG PO TABS
1.0000 | ORAL_TABLET | Freq: Every day | ORAL | Status: DC
  Administered 2020-09-28 – 2020-10-01 (×4): 1 via ORAL
  Filled 2020-09-28 (×4): qty 1

## 2020-09-28 MED ORDER — ONDANSETRON HCL 4 MG/2ML IJ SOLN
4.0000 mg | Freq: Four times a day (QID) | INTRAMUSCULAR | Status: DC | PRN
  Administered 2020-09-28: 4 mg via INTRAVENOUS
  Filled 2020-09-28: qty 2

## 2020-09-28 MED ORDER — ALBUTEROL SULFATE HFA 108 (90 BASE) MCG/ACT IN AERS: 1.0000 | INHALATION_SPRAY | Freq: Four times a day (QID) | RESPIRATORY_TRACT | Status: DC | PRN

## 2020-09-28 MED ORDER — SODIUM CHLORIDE 0.9 % IV SOLN: INTRAVENOUS | Status: AC

## 2020-09-28 MED ORDER — ENOXAPARIN SODIUM 60 MG/0.6ML ~~LOC~~ SOLN
50.0000 mg | SUBCUTANEOUS | Status: DC
  Administered 2020-09-28 – 2020-10-01 (×4): 50 mg via SUBCUTANEOUS
  Filled 2020-09-28 (×4): qty 0.6

## 2020-09-28 MED ORDER — METRONIDAZOLE IN NACL 5-0.79 MG/ML-% IV SOLN
500.0000 mg | Freq: Three times a day (TID) | INTRAVENOUS | Status: DC
  Administered 2020-09-28 – 2020-10-01 (×10): 500 mg via INTRAVENOUS
  Filled 2020-09-28 (×10): qty 100

## 2020-09-28 MED ORDER — ACETAMINOPHEN 650 MG RE SUPP: 650.0000 mg | Freq: Four times a day (QID) | RECTAL | Status: DC | PRN

## 2020-09-28 MED ORDER — SODIUM CHLORIDE 0.9 % IV SOLN
2.0000 g | Freq: Three times a day (TID) | INTRAVENOUS | Status: DC
  Filled 2020-09-28 (×2): qty 2

## 2020-09-28 MED ORDER — TRAZODONE HCL 50 MG PO TABS
25.0000 mg | ORAL_TABLET | Freq: Every day | ORAL | Status: DC
  Administered 2020-09-28 – 2020-09-30 (×3): 25 mg via ORAL
  Filled 2020-09-28 (×3): qty 1

## 2020-09-28 MED ORDER — SODIUM CHLORIDE 0.9 % IV SOLN: INTRAVENOUS | Status: DC

## 2020-09-28 MED ORDER — ACETAMINOPHEN 325 MG PO TABS
650.0000 mg | ORAL_TABLET | Freq: Four times a day (QID) | ORAL | Status: DC | PRN
  Administered 2020-09-29 – 2020-10-01 (×4): 650 mg via ORAL
  Filled 2020-09-28 (×4): qty 2

## 2020-09-28 NOTE — Progress Notes (Signed)
PROGRESS NOTE    Drew Cuevas  AXK:[id number] DOB: 15-Feb-1971 DOA: 09/27/2020 PCP: Ladell Pier, MD    Chief Complaint  Patient presents with  . Abdominal Pain    Brief Narrative:  Patient 50 year old gentleman history of renal cell carcinoma status post partial nephrectomy 2015, chronic kidney disease stage IIIa, hypertension, HIV presented to the ED with left lower quadrant abdominal pain x1 week that had worsened.  Patient evaluated in urgent care 1 day prior to admission and treated for suspected acute diverticulitis and placed on Cipro and Flagyl.  No imaging done.  Patient took 2 doses of the antibiotics and also some tramadol but due to worsening pain presented to the ED.  Patient with complaints of some subjective fevers.  Patient seen in the ED CT abdomen and pelvis done concerning for acute sigmoid diverticulitis with suspected 1.9 cm abscess.  Patient admitted, placed on IV antibiotics.  General surgery consulted.   Assessment & Plan:   Principal Problem:   Diverticulitis of large intestine with abscess Active Problems:   Human immunodeficiency virus (HIV) disease ([place])   Hypertension   Chronic kidney disease, stage 3a ([place])   1 acute sigmoid: Diverticulitis with expected diverticular abscess 1.9 cm -Patient presented with worsening left lower quadrant abdominal pain after starting Cipro and Flagyl in the outpatient setting with some subjective fevers.  CT abdomen and pelvis done concerning for acute sigmoid diverticulitis with small abscess. -Patient states no significant change since admission. -Currently afebrile. -Leukocytosis trending down. -Continue empiric IV cefepime and IV Flagyl. -Patient seen in consultation by general surgery who feels no acute surgical intervention needed at this time and to treat conservatively with IV antibiotics, hydration, pain management and monitor over the next 24 to 48 hours. -Patient started on clears. -General surgery  following and appreciate input and recommendations.  2.  HIV -CD4 count of 934, viral load undetectable November 2021. -Continue Triumeq.  3.  Chronic kidney disease stage IIIa Renal function stable.  Currently at baseline.  4.  Hypertension Blood pressure currently stable.  Continue to hold Norvasc.   DVT prophylaxis: Lovenox Code Status full Family Communication: Updated patient.  No family at bedside. Disposition:   Status is: Inpatient    Dispo: The patient is from: Home              Anticipated d/c is to: Home              Patient currently with acute diverticulitis with diverticular abscess, on IV antibiotics, on clear liquids, not stable for discharge.   Difficult to place patient no       Consultants:   General surgery: Dr. Brantley Stage 09/28/2020  Procedures:  CT abdomen and pelvis 09/27/2020    Antimicrobials:   IV cefepime 09/27/2020>>>>  IV Flagyl 09/27/2020>>>>>   Subjective: Patient laying in bed.  States he feels so-so.  Still with left-sided lower abdominal pain.  No nausea or vomiting.  Tolerating clears.  Objective: Vitals:   09/27/20 2312 09/28/20 0233 09/28/20 0551 09/28/20 0931  BP:  104/71 105/67 110/80  Pulse:  85 78 79  Resp:  16 16 16   Temp:  98.9 F (37.2 C) 98.6 F (37 C) 98.1 F (36.7 C)  TempSrc:  Oral Oral Oral  SpO2:  94% 95% 97%  Weight: 103.7 kg     Height: 5\' 5"  (1.651 m)       Intake/Output Summary (Last 24 hours) at 09/28/2020 1121 Last data filed at 09/27/2020 2235 Gross per  24 hour  Intake 1100 ml  Output -  Net 1100 ml   Filed Weights   09/27/20 2312  Weight: 103.7 kg    Examination:  General exam: Appears calm and comfortable  Respiratory system: Clear to auscultation. Respiratory effort normal. Cardiovascular system: S1 & S2 heard, RRR. No JVD, murmurs, rubs, gallops or clicks. No pedal edema. Gastrointestinal system: Abdomen is nondistended, soft and TTP in LLQ.  No organomegaly or masses felt. Normal bowel  sounds heard. Central nervous system: Alert and oriented. No focal neurological deficits. Extremities: Symmetric 5 x 5 power. Skin: No rashes, lesions or ulcers Psychiatry: Judgement and insight appear normal. Mood & affect appropriate.     Data Reviewed: I have personally reviewed following labs and imaging studies  CBC: Recent Labs  Lab 09/26/20 1049 09/27/20 1917 09/28/20 0314  WBC 10.4 13.5* 11.0*  NEUTROABS 6.2  --   --   HGB 14.6 14.7 13.1  HCT 44.1 43.5 38.9*  MCV 96.9 96.0 96.5  PLT 275 269 381    Basic Metabolic Panel: Recent Labs  Lab 09/26/20 1049 09/27/20 1917 09/28/20 0314  NA 140 138 135  K 4.4 4.4 4.2  CL 105 105 105  CO2 25 25 22   GLUCOSE 81 83 93  BUN 9 15 13   CREATININE 1.48* 1.38* 1.35*  CALCIUM 9.3 9.6 8.6*    GFR: Estimated Creatinine Clearance: 74.2 mL/min (A) (by C-G formula based on SCr of 1.35 mg/dL (H)).  Liver Function Tests: Recent Labs  Lab 09/26/20 1049 09/27/20 1917  AST 17 20  ALT 22 23  ALKPHOS 74 82  BILITOT 0.6 0.4  PROT 6.9 7.7  ALBUMIN 3.4* 3.8    CBG: No results for input(s): GLUCAP in the last 168 hours.   Recent Results (from the past 240 hour(s))  Resp Panel by RT-PCR (Flu A&B, Covid) Nasopharyngeal Swab     Status: None   Collection Time: 09/27/20  9:27 PM   Specimen: Nasopharyngeal Swab; Nasopharyngeal(NP) swabs in vial transport medium  Result Value Ref Range Status   SARS Coronavirus 2 by RT PCR NEGATIVE NEGATIVE Final    Comment: (NOTE) SARS-CoV-2 target nucleic acids are NOT DETECTED.  The SARS-CoV-2 RNA is generally detectable in upper respiratory specimens during the acute phase of infection. The lowest concentration of SARS-CoV-2 viral copies this assay can detect is 138 copies/mL. A negative result does not preclude SARS-Cov-2 infection and should not be used as the sole basis for treatment or other patient management decisions. A negative result may occur with  improper specimen  collection/handling, submission of specimen other than nasopharyngeal swab, presence of viral mutation(s) within the areas targeted by this assay, and inadequate number of viral copies(<138 copies/mL). A negative result must be combined with clinical observations, patient history, and epidemiological information. The expected result is Negative.  Fact Sheet for Patients:  EntrepreneurPulse.com.au  Fact Sheet for Healthcare Providers:  IncredibleEmployment.be  This test is no t yet approved or cleared by the [place] FDA and  has been authorized for detection and/or diagnosis of SARS-CoV-2 by FDA under an Emergency Use Authorization (EUA). This EUA will remain  in effect (meaning this test can be used) for the duration of the COVID-19 declaration under Section 564(b)(1) of the Act, 21 U.S.C.section 360bbb-3(b)(1), unless the authorization is terminated  or revoked sooner.       Influenza A by PCR NEGATIVE NEGATIVE Final   Influenza B by PCR NEGATIVE NEGATIVE Final    Comment: (NOTE) The  Xpert Xpress SARS-CoV-2/FLU/RSV plus assay is intended as an aid in the diagnosis of influenza from Nasopharyngeal swab specimens and should not be used as a sole basis for treatment. Nasal washings and aspirates are unacceptable for Xpert Xpress SARS-CoV-2/FLU/RSV testing.  Fact Sheet for Patients: EntrepreneurPulse.com.au  Fact Sheet for Healthcare Providers: IncredibleEmployment.be  This test is not yet approved or cleared by the [place] FDA and has been authorized for detection and/or diagnosis of SARS-CoV-2 by FDA under an Emergency Use Authorization (EUA). This EUA will remain in effect (meaning this test can be used) for the duration of the COVID-19 declaration under Section 564(b)(1) of the Act, 21 U.S.C. section 360bbb-3(b)(1), unless the authorization is terminated or revoked.  Performed at [facility], [place] [address] [place], [place] [address]          Radiology Studies: CT ABDOMEN PELVIS W CONTRAST  Result Date: 09/27/2020 CLINICAL DATA:  Acute abdominal pain in the left lower quadrant for 1 week. HIV. Partial nephrectomy due to renal cancer. EXAM: CT ABDOMEN AND PELVIS WITH CONTRAST TECHNIQUE: Multidetector CT imaging of the abdomen and pelvis was performed using the standard protocol following bolus administration of intravenous contrast. CONTRAST:  48mL OMNIPAQUE IOHEXOL 300 MG/ML  SOLN COMPARISON:  04/06/2015 FINDINGS: Lower chest: Unremarkable Hepatobiliary: Contracted gallbladder.  Otherwise unremarkable. Pancreas: Pancreas divisum.  No CT findings of pancreatitis. Spleen: Unremarkable Adrenals/Urinary Tract: Both adrenal glands appear normal. 1.8 by 1.5 cm fluid density lesion of the left mid kidney posteriorly compatible with cyst, not appreciably changed from prior. Stable findings of partial nephrectomy on the right. Stomach/Bowel: Acute sigmoid colon diverticulitis with considerable mesenteric stranding and suspected diverticular abscess measuring about 1.9 cm in diameter on image 69 of series 2. Although this diverticular abscess may have some minimal contained extraluminal gas, I do not see separate free intraperitoneal gas. Normal appendix. Vascular/Lymphatic: Minimal common iliac artery atherosclerotic calcification bilaterally. Small pelvic lymph nodes are likely reactive. Reproductive: Unremarkable Other: No supplemental non-categorized findings. Musculoskeletal: Unremarkable IMPRESSION: 1. Acute sigmoid colon diverticulitis with considerable mesenteric stranding and suspected diverticular abscess measuring about 1.9 cm in diameter. Although this diverticular abscess may have some minimal contained extraluminal gas, I do not see separate free intraperitoneal gas. 2. Pancreas divisum. 3. Minimal common iliac artery atherosclerotic calcification bilaterally.  Electronically Signed   By: Van Clines M.D.   On: 09/27/2020 21:09        Scheduled Meds: . abacavir-dolutegravir-lamiVUDine  1 tablet Oral Daily  . enoxaparin (LOVENOX) injection  50 mg Subcutaneous Q24H  . escitalopram  10 mg Oral Daily  . traZODone  25 mg Oral QHS   Continuous Infusions: . sodium chloride 125 mL/hr at 09/28/20 0931  . ceFEPime (MAXIPIME) IV 2 g (09/28/20 0933)  . metronidazole 500 mg (09/28/20 0552)     LOS: 1 day    Time spent: 40 minutes    Irine Seal, MD [facility]   To contact the attending provider between 7A-7P or the covering provider during after hours 7P-7A, please log into the web site [email or web address] and access using universal [place] password for that web site. If you do not have the password, please call the hospital operator.  09/28/2020, 11:21 AM

## 2020-09-28 NOTE — Progress Notes (Signed)
Patient is requesting pain medication and a nicotine patch. Paged Dr. Myna Hidalgo.

## 2020-09-28 NOTE — Consult Note (Signed)
Reason for Consult:DIVERTICULITIS / ABSCESS Referring Physician: Grandville Silos MD   Drew Cuevas is an 50 y.o. male.  HPI: Pleasant 50 yo male with hx of HIV, GERD, renal insufficiency presenting with LLQ abdominal pain for last week.  Sharp in nature 7/10 LLQ abdomen made worse with movement CT show diverticulitis and small abscess 1 previous bout per patient may years ago No colonoscopy yet  Past Medical History:  Diagnosis Date  . Bronchitis    LAST FLARE UP WAS NEW YEARS 2015  . Cancer ([place])    kidney  . GERD (gastroesophageal reflux disease)    NO MEDS  . HIV (human immunodeficiency virus infection) ([place])    UNDER CONTROL WITH MEDICATIONS  . Hypertension   . Immune deficiency disorder ([place])   . Renal insufficiency 05/17/2015  . Renal mass, right     Past Surgical History:  Procedure Laterality Date  . NO PAST SURGERIES    . ROBOT ASSISTED LAPAROSCOPIC NEPHRECTOMY Right 08/19/2013   Procedure: ROBOTIC ASSISTED LAPAROSCOPIC RIGHT PARTIAL NEPHRECTOMY;  Surgeon: Ardis Hughs, MD;  Location: [facility];  Service: Urology;  Laterality: Right;    Family History  Problem Relation Age of Onset  . Hypertension Mother   . Hypertension Sister   . Hypertension Brother     Social History:  reports that he has been smoking cigarettes. He has a 15.00 pack-year smoking history. He has never used smokeless tobacco. He reports that he does not drink alcohol and does not use drugs.  Allergies:   See record Medications: I have reviewed the patient's current medications.  Results for orders placed or performed during the hospital encounter of 09/27/20 (from the past 48 hour(s))  Urinalysis, Routine w reflex microscopic     Status: Abnormal   Collection Time: 09/27/20  7:01 PM  Result Value Ref Range   Color, Urine YELLOW YELLOW   APPearance CLEAR CLEAR   Specific Gravity, Urine 1.019 1.005 - 1.030   pH 7.0 5.0 - 8.0   Glucose, UA NEGATIVE NEGATIVE mg/dL   Hgb urine dipstick NEGATIVE  NEGATIVE   Bilirubin Urine NEGATIVE NEGATIVE   Ketones, ur NEGATIVE NEGATIVE mg/dL   Protein, ur NEGATIVE NEGATIVE mg/dL   Nitrite NEGATIVE NEGATIVE   Leukocytes,Ua TRACE (A) NEGATIVE   RBC / HPF 0-5 0 - 5 RBC/hpf   WBC, UA 0-5 0 - 5 WBC/hpf   Bacteria, UA NONE SEEN NONE SEEN    Comment: Performed at [facility], [place] [address] [place], [place] [address]  Lipase, blood     Status: None   Collection Time: 09/27/20  7:17 PM  Result Value Ref Range   Lipase 41 11 - 51 U/L    Comment: Performed at [facility] [place] [address] [place], [place] [address]  Comprehensive metabolic panel     Status: Abnormal   Collection Time: 09/27/20  7:17 PM  Result Value Ref Range   Sodium 138 135 - 145 mmol/L   Potassium 4.4 3.5 - 5.1 mmol/L   Chloride 105 98 - 111 mmol/L   CO2 25 22 - 32 mmol/L   Glucose, Bld 83 70 - 99 mg/dL    Comment: Glucose reference range applies only to samples taken after fasting for at least 8 hours.   BUN 15 6 - 20 mg/dL   Creatinine, Ser 1.38 (H) 0.61 - 1.24 mg/dL   Calcium 9.6 8.9 - 10.3 mg/dL   Total Protein 7.7 6.5 - 8.1 g/dL   Albumin  3.8 3.5 - 5.0 g/dL   AST 20 15 - 41 U/L   ALT 23 0 - 44 U/L   Alkaline Phosphatase 82 38 - 126 U/L   Total Bilirubin 0.4 0.3 - 1.2 mg/dL   GFR, Estimated >60 >60 mL/min    Comment: (NOTE) Calculated using the CKD-EPI Creatinine Equation (2021)    Anion gap 8 5 - 15    Comment: Performed at [facility], [place] [address], [place], [place] [address]  CBC     Status: Abnormal   Collection Time: 09/27/20  7:17 PM  Result Value Ref Range   WBC 13.5 (H) 4.0 - 10.5 K/uL   RBC 4.53 4.22 - 5.81 MIL/uL   Hemoglobin 14.7 13.0 - 17.0 g/dL   HCT 43.5 39.0 - 52.0 %   MCV 96.0 80.0 - 100.0 fL   MCH 32.5 26.0 - 34.0 pg   MCHC 33.8 30.0 - 36.0 g/dL   RDW 13.2 11.5 - 15.5 %   Platelets 269 150 - 400 K/uL   nRBC 0.0 0.0 - 0.2 %    Comment: Performed at [facility],  [place] [address], [place], [place] [address]  Resp Panel by RT-PCR (Flu A&B, Covid) Nasopharyngeal Swab     Status: None   Collection Time: 09/27/20  9:27 PM   Specimen: Nasopharyngeal Swab; Nasopharyngeal(NP) swabs in vial transport medium  Result Value Ref Range   SARS Coronavirus 2 by RT PCR NEGATIVE NEGATIVE    Comment: (NOTE) SARS-CoV-2 target nucleic acids are NOT DETECTED.  The SARS-CoV-2 RNA is generally detectable in upper respiratory specimens during the acute phase of infection. The lowest concentration of SARS-CoV-2 viral copies this assay can detect is 138 copies/mL. A negative result does not preclude SARS-Cov-2 infection and should not be used as the sole basis for treatment or other patient management decisions. A negative result may occur with  improper specimen collection/handling, submission of specimen other than nasopharyngeal swab, presence of viral mutation(s) within the areas targeted by this assay, and inadequate number of viral copies(<138 copies/mL). A negative result must be combined with clinical observations, patient history, and epidemiological information. The expected result is Negative.  Fact Sheet for Patients:  EntrepreneurPulse.com.au  Fact Sheet for Healthcare Providers:  IncredibleEmployment.be  This test is no t yet approved or cleared by the [place] FDA and  has been authorized for detection and/or diagnosis of SARS-CoV-2 by FDA under an Emergency Use Authorization (EUA). This EUA will remain  in effect (meaning this test can be used) for the duration of the COVID-19 declaration under Section 564(b)(1) of the Act, 21 U.S.C.section 360bbb-3(b)(1), unless the authorization is terminated  or revoked sooner.       Influenza A by PCR NEGATIVE NEGATIVE   Influenza B by PCR NEGATIVE NEGATIVE    Comment: (NOTE) The Xpert Xpress SARS-CoV-2/FLU/RSV plus assay is intended as an aid in the diagnosis of  influenza from Nasopharyngeal swab specimens and should not be used as a sole basis for treatment. Nasal washings and aspirates are unacceptable for Xpert Xpress SARS-CoV-2/FLU/RSV testing.  Fact Sheet for Patients: EntrepreneurPulse.com.au  Fact Sheet for Healthcare Providers: IncredibleEmployment.be  This test is not yet approved or cleared by the [place] FDA and has been authorized for detection and/or diagnosis of SARS-CoV-2 by FDA under an Emergency Use Authorization (EUA). This EUA will remain in effect (meaning this test can be used) for the duration of the COVID-19 declaration under Section 564(b)(1) of the Act,  21 U.S.C. section 360bbb-3(b)(1), unless the authorization is terminated or revoked.  Performed at [facility], [place] [address], [place], [place] [address]   Basic metabolic panel     Status: Abnormal   Collection Time: 09/28/20  3:14 AM  Result Value Ref Range   Sodium 135 135 - 145 mmol/L   Potassium 4.2 3.5 - 5.1 mmol/L   Chloride 105 98 - 111 mmol/L   CO2 22 22 - 32 mmol/L   Glucose, Bld 93 70 - 99 mg/dL    Comment: Glucose reference range applies only to samples taken after fasting for at least 8 hours.   BUN 13 6 - 20 mg/dL   Creatinine, Ser 1.35 (H) 0.61 - 1.24 mg/dL   Calcium 8.6 (L) 8.9 - 10.3 mg/dL   GFR, Estimated >60 >60 mL/min    Comment: (NOTE) Calculated using the CKD-EPI Creatinine Equation (2021)    Anion gap 8 5 - 15    Comment: Performed at [facility], [place] [address] [place], [place] [address]  CBC     Status: Abnormal   Collection Time: 09/28/20  3:14 AM  Result Value Ref Range   WBC 11.0 (H) 4.0 - 10.5 K/uL   RBC 4.03 (L) 4.22 - 5.81 MIL/uL   Hemoglobin 13.1 13.0 - 17.0 g/dL   HCT 38.9 (L) 39.0 - 52.0 %   MCV 96.5 80.0 - 100.0 fL   MCH 32.5 26.0 - 34.0 pg   MCHC 33.7 30.0 - 36.0 g/dL   RDW 13.2 11.5 - 15.5 %   Platelets 228 150 - 400 K/uL   nRBC  0.0 0.0 - 0.2 %    Comment: Performed at [facility], [place] [address], [place], [place] [address]    CT ABDOMEN PELVIS W CONTRAST  Result Date: 09/27/2020 CLINICAL DATA:  Acute abdominal pain in the left lower quadrant for 1 week. HIV. Partial nephrectomy due to renal cancer. EXAM: CT ABDOMEN AND PELVIS WITH CONTRAST TECHNIQUE: Multidetector CT imaging of the abdomen and pelvis was performed using the standard protocol following bolus administration of intravenous contrast. CONTRAST:  21mL OMNIPAQUE IOHEXOL 300 MG/ML  SOLN COMPARISON:  04/06/2015 FINDINGS: Lower chest: Unremarkable Hepatobiliary: Contracted gallbladder.  Otherwise unremarkable. Pancreas: Pancreas divisum.  No CT findings of pancreatitis. Spleen: Unremarkable Adrenals/Urinary Tract: Both adrenal glands appear normal. 1.8 by 1.5 cm fluid density lesion of the left mid kidney posteriorly compatible with cyst, not appreciably changed from prior. Stable findings of partial nephrectomy on the right. Stomach/Bowel: Acute sigmoid colon diverticulitis with considerable mesenteric stranding and suspected diverticular abscess measuring about 1.9 cm in diameter on image 69 of series 2. Although this diverticular abscess may have some minimal contained extraluminal gas, I do not see separate free intraperitoneal gas. Normal appendix. Vascular/Lymphatic: Minimal common iliac artery atherosclerotic calcification bilaterally. Small pelvic lymph nodes are likely reactive. Reproductive: Unremarkable Other: No supplemental non-categorized findings. Musculoskeletal: Unremarkable IMPRESSION: 1. Acute sigmoid colon diverticulitis with considerable mesenteric stranding and suspected diverticular abscess measuring about 1.9 cm in diameter. Although this diverticular abscess may have some minimal contained extraluminal gas, I do not see separate free intraperitoneal gas. 2. Pancreas divisum. 3. Minimal common iliac artery atherosclerotic  calcification bilaterally. Electronically Signed   By: Van Clines M.D.   On: 09/27/2020 21:09    Review of Systems  Constitutional: Positive for fatigue. Negative for activity change.  HENT: Negative.  Negative for congestion and dental problem.   Eyes: Negative.  Negative for  discharge and itching.  Respiratory: Negative.   Cardiovascular: Negative.   Gastrointestinal: Positive for abdominal pain. Negative for vomiting.  Endocrine: Negative.   Genitourinary: Negative for dysuria, flank pain, frequency and urgency.  Musculoskeletal: Negative.  Negative for arthralgias and back pain.  Allergic/Immunologic: Negative for environmental allergies.  Neurological: Negative for dizziness and facial asymmetry.  Hematological: Positive for adenopathy.  Psychiatric/Behavioral: Negative for agitation and behavioral problems.   Blood pressure 105/67, pulse 78, temperature 98.6 F (37 C), temperature source Oral, resp. rate 16, height 5\' 5"  (1.651 m), weight 103.7 kg, SpO2 95 %. Physical Exam Constitutional:      Appearance: He is well-developed.  HENT:     Head: Normocephalic and atraumatic.  Eyes:     Extraocular Movements: Extraocular movements intact.     Pupils: Pupils are equal, round, and reactive to light.  Cardiovascular:     Rate and Rhythm: Normal rate and regular rhythm.  Pulmonary:     Effort: Pulmonary effort is normal.     Breath sounds: Normal breath sounds.  Abdominal:     General: Abdomen is flat. There is no distension.     Palpations: Abdomen is soft.     Tenderness: There is abdominal tenderness in the left lower quadrant. There is guarding.     Hernia: No hernia is present.  Skin:    General: Skin is warm and dry.  Neurological:     General: No focal deficit present.     Mental Status: He is alert and oriented to person, place, and time.  Psychiatric:        Mood and Affect: Mood normal.        Behavior: Behavior normal.     Assessment/Plan: Acute  diverticulitis with small abscess   Continue IV ABX Clear liquids ok once IR evaluates him No acute surgical need at this point   Follow along   Patient Active Problem List   Diagnosis Date Noted  . Diverticulitis of large intestine with abscess 09/27/2020  . Depression 06/19/2019  . Other insomnia 06/19/2019  . Chronic kidney disease, stage 3a ([place]) 04/15/2018  . Tobacco dependence 01/13/2018  . Primary osteoarthritis of right knee 04/03/2017  . Dyslipidemia 06/14/2016  . Renal cell carcinoma of right kidney ([place]) 08/19/2013  . Renal cyst 05/21/2013  . Obesity (BMI 30-39.9) 05/26/2012  . Internal hemorrhoids 12/01/2008  . Hypertension 04/28/2008  . Major depressive disorder, single episode, severe ([place]) 12/20/2006  . Human immunodeficiency virus (HIV) disease ([place]) 08/28/2006    Shaeley Segall A Jalisia Puchalski 09/28/2020, 8:04 AM

## 2020-09-28 NOTE — Progress Notes (Signed)
Pharmacy Antibiotic Note  Syaire Saber is a 50 y.o. male admitted on 09/27/2020 with diverticulitis with abscess.  PMH significant for HIV, HTN.  Pharmacy has been consulted for Cefepime dosing.  Plan: Cefepime 2gm IV q8h Metronidazole per MD  Height: 5\' 5"  (165.1 cm) Weight: 103.7 kg (228 lb 9.9 oz) IBW/kg (Calculated) : 61.5  Temp (24hrs), Avg:98.8 F (37.1 C), Min:98.7 F (37.1 C), Max:99 F (37.2 C)  Recent Labs  Lab 09/26/20 1049 09/27/20 1917  WBC 10.4 13.5*  CREATININE 1.48* 1.38*    Estimated Creatinine Clearance: 72.6 mL/min (A) (by C-G formula based on SCr of 1.38 mg/dL (H)).      Antimicrobials this admission: 3/1 Flagyl >>   3/1 Cefepime >>    Thank you for allowing pharmacy to be a part of this patient's care.  Everette Rank 09/28/2020 1:47 AM

## 2020-09-28 NOTE — H&P (Signed)
History and Physical    Gildo Crisco KPT:[id number] DOB: [birth year] DOA: 09/27/2020  PCP: Ladell Pier, MD   Patient coming from: Home   Chief Complaint: Abdominal pain   HPI: Drew Cuevas is a 50 y.o. male with medical history significant for renal cell carcinoma status post partial nephrectomy in 2015, chronic kidney disease 3A, hypertension, and HIV, now presenting to the emergency department for evaluation of abdominal pain. Patient reports that he developed some pain in the left lower quadrant of his abdomen approximately 1 week ago, has steadily worsened, and so he was evaluated at urgent care yesterday and reports that he was suspected to have acute diverticulitis and given ciprofloxacin and Flagyl. He did not have any imaging at the urgent care. Reports that he took 2 doses of the antibiotic, was also taken some tramadol, but pain became more severe, prompting his presentation to the ED today. He felt that he may have had a fever today. Denies any cough, shortness of breath, or chest pain.  ED Course: Upon arrival to the ED, patient is found to be afebrile, saturating well on room air, and with stable blood pressure. Chemistry panel notable for creatinine 1.38. CBC features a leukocytosis to 13,500. CT the abdomen and pelvis concerning for acute sigmoid diverticulitis with mesenteric stranding and suspected 1.9 cm abscess. COVID-19 PCR is negative. Surgery was consulted by the ED and medical admission was recommended. Patient was treated cefepime and Flagyl, 1 L of saline, Zofran, and morphine.  Review of Systems:  All other systems reviewed and apart from HPI, are negative.  Past Medical History:  Diagnosis Date  . Bronchitis    LAST FLARE UP WAS NEW YEARS 2015  . Cancer ([place])    kidney  . GERD (gastroesophageal reflux disease)    NO MEDS  . HIV (human immunodeficiency virus infection) ([place])    UNDER CONTROL WITH MEDICATIONS  . Hypertension   . Immune deficiency disorder  ([place])   . Renal insufficiency 05/17/2015  . Renal mass, right     Past Surgical History:  Procedure Laterality Date  . NO PAST SURGERIES    . ROBOT ASSISTED LAPAROSCOPIC NEPHRECTOMY Right 08/19/2013   Procedure: ROBOTIC ASSISTED LAPAROSCOPIC RIGHT PARTIAL NEPHRECTOMY;  Surgeon: Ardis Hughs, MD;  Location: [facility];  Service: Urology;  Laterality: Right;    Social History:   reports that he has been smoking cigarettes. He has a 15.00 pack-year smoking history. He has never used smokeless tobacco. He reports that he does not drink alcohol and does not use drugs.    Family History  Problem Relation Age of Onset  . Hypertension Mother   . Hypertension Sister   . Hypertension Brother      Prior to Admission medications   Medication Sig Start Date End Date Taking? Authorizing Provider  albuterol (VENTOLIN HFA) 108 (90 Base) MCG/ACT inhaler Inhale 1-2 puffs into the lungs every 6 (six) hours as needed for wheezing or shortness of breath.   Yes [provider]  amLODipine (NORVASC) 10 MG tablet Take 1 tablet (10 mg total) by mouth daily. 09/01/20  Yes Ladell Pier, MD  ciprofloxacin (CIPRO) 500 MG tablet Take 1 tablet (500 mg total) by mouth 2 (two) times daily. Patient taking differently: Take 500 mg by mouth 2 (two) times daily. Start date : 09/26/20 09/26/20  Yes Vanessa Kick, MD  escitalopram (LEXAPRO) 10 MG tablet TAKE 1 TABLET(10 MG) BY MOUTH DAILY Patient taking differently: Take 10 mg by mouth daily.  10/02/19  Yes Ladell Pier, MD  lovastatin (MEVACOR) 40 MG tablet Take 1 tablet (40 mg total) by mouth at bedtime. 09/01/20  Yes Ladell Pier, MD  metroNIDAZOLE (FLAGYL) 500 MG tablet Take 1 tablet (500 mg total) by mouth 3 (three) times daily. Patient taking differently: Take 500 mg by mouth 3 (three) times daily. Start date: 09/26/20 09/26/20  Yes Vanessa Kick, MD  traZODone (DESYREL) 50 MG tablet TAKE 1/2 TABLET(25 MG) BY MOUTH AT BEDTIME Patient taking  differently: Take 25 mg by mouth at bedtime. 08/31/20  Yes Ladell Pier, MD  [place] 600-50-300 MG tablet TAKE 1 TABLET BY MOUTH ONCE DAILY Patient taking differently: Take 1 tablet by mouth daily. 07/01/20  Yes Michel Bickers, MD    Physical Exam: Vitals:   09/27/20 1931 09/27/20 2015 09/27/20 2303 09/27/20 2312  BP: 130/90 (!) 121/92 126/87   Pulse: (!) 102 98 87   Resp: 20 13 16    Temp: 99 F (37.2 C)  98.7 F (37.1 C)   TempSrc: Oral  Oral   SpO2: 97% 100% 100%   Weight:    103.7 kg  Height:    5\' 5"  (1.651 m)    Constitutional: NAD, calm  Eyes: PERTLA, lids and conjunctivae normal ENMT: Mucous membranes are moist. Posterior pharynx clear of any exudate or lesions.   Neck: normal, supple, no masses, no thyromegaly Respiratory: no wheezing, no crackles. No accessory muscle use.  Cardiovascular: S1 & S2 heard, regular rate and rhythm. No extremity edema.   Abdomen: soft, tender in LLQ, no guarding. Bowel sounds active.  Musculoskeletal: no clubbing / cyanosis. No joint deformity upper and lower extremities.   Skin: no significant rashes, lesions, ulcers. Warm, dry, well-perfused. Neurologic: CN 2-12 grossly intact. Sensation intact. Moving all extremities.  Psychiatric: Alert and oriented to person, place, and situation. Pleasant and cooperative.    Labs and Imaging on Admission: I have personally reviewed following labs and imaging studies  CBC: Recent Labs  Lab 09/26/20 1049 09/27/20 1917  WBC 10.4 13.5*  NEUTROABS 6.2  --   HGB 14.6 14.7  HCT 44.1 43.5  MCV 96.9 96.0  PLT 275 034   Basic Metabolic Panel: Recent Labs  Lab 09/26/20 1049 09/27/20 1917  NA 140 138  K 4.4 4.4  CL 105 105  CO2 25 25  GLUCOSE 81 83  BUN 9 15  CREATININE 1.48* 1.38*  CALCIUM 9.3 9.6   GFR: Estimated Creatinine Clearance: 72.6 mL/min (A) (by C-G formula based on SCr of 1.38 mg/dL (H)). Liver Function Tests: Recent Labs  Lab 09/26/20 1049 09/27/20 1917  AST 17 20   ALT 22 23  ALKPHOS 74 82  BILITOT 0.6 0.4  PROT 6.9 7.7  ALBUMIN 3.4* 3.8   Recent Labs  Lab 09/26/20 1049 09/27/20 1917  LIPASE 46 41   No results for input(s): AMMONIA in the last 168 hours. Coagulation Profile: No results for input(s): INR, PROTIME in the last 168 hours. Cardiac Enzymes: No results for input(s): CKTOTAL, CKMB, CKMBINDEX, TROPONINI in the last 168 hours. BNP (last 3 results) No results for input(s): PROBNP in the last 8760 hours. HbA1C: No results for input(s): HGBA1C in the last 72 hours. CBG: No results for input(s): GLUCAP in the last 168 hours. Lipid Profile: No results for input(s): CHOL, HDL, LDLCALC, TRIG, CHOLHDL, LDLDIRECT in the last 72 hours. Thyroid Function Tests: No results for input(s): TSH, T4TOTAL, FREET4, T3FREE, THYROIDAB in the last 72 hours. Anemia Panel: No  results for input(s): VITAMINB12, FOLATE, FERRITIN, TIBC, IRON, RETICCTPCT in the last 72 hours. Urine analysis:    Component Value Date/Time   COLORURINE YELLOW 09/27/2020 1901   APPEARANCEUR CLEAR 09/27/2020 1901   LABSPEC 1.019 09/27/2020 1901   PHURINE 7.0 09/27/2020 1901   GLUCOSEU NEGATIVE 09/27/2020 1901   GLUCOSEU NEG mg/dL 08/07/2010 2114   HGBUR NEGATIVE 09/27/2020 1901   BILIRUBINUR NEGATIVE 09/27/2020 1901   KETONESUR NEGATIVE 09/27/2020 1901   PROTEINUR NEGATIVE 09/27/2020 1901   UROBILINOGEN 0.2 09/26/2020 1045   NITRITE NEGATIVE 09/27/2020 1901   LEUKOCYTESUR TRACE (A) 09/27/2020 1901   Sepsis Labs: @LABRCNTIP (procalcitonin:4,lacticidven:4) ) Recent Results (from the past 240 hour(s))  Resp Panel by RT-PCR (Flu A&B, Covid) Nasopharyngeal Swab     Status: None   Collection Time: 09/27/20  9:27 PM   Specimen: Nasopharyngeal Swab; Nasopharyngeal(NP) swabs in vial transport medium  Result Value Ref Range Status   SARS Coronavirus 2 by RT PCR NEGATIVE NEGATIVE Final    Comment: (NOTE) SARS-CoV-2 target nucleic acids are NOT DETECTED.  The SARS-CoV-2 RNA  is generally detectable in upper respiratory specimens during the acute phase of infection. The lowest concentration of SARS-CoV-2 viral copies this assay can detect is 138 copies/mL. A negative result does not preclude SARS-Cov-2 infection and should not be used as the sole basis for treatment or other patient management decisions. A negative result may occur with  improper specimen collection/handling, submission of specimen other than nasopharyngeal swab, presence of viral mutation(s) within the areas targeted by this assay, and inadequate number of viral copies(<138 copies/mL). A negative result must be combined with clinical observations, patient history, and epidemiological information. The expected result is Negative.  Fact Sheet for Patients:  EntrepreneurPulse.com.au  Fact Sheet for Healthcare Providers:  IncredibleEmployment.be  This test is no t yet approved or cleared by the [place] FDA and  has been authorized for detection and/or diagnosis of SARS-CoV-2 by FDA under an Emergency Use Authorization (EUA). This EUA will remain  in effect (meaning this test can be used) for the duration of the COVID-19 declaration under Section 564(b)(1) of the Act, 21 U.S.C.section 360bbb-3(b)(1), unless the authorization is terminated  or revoked sooner.       Influenza A by PCR NEGATIVE NEGATIVE Final   Influenza B by PCR NEGATIVE NEGATIVE Final    Comment: (NOTE) The Xpert Xpress SARS-CoV-2/FLU/RSV plus assay is intended as an aid in the diagnosis of influenza from Nasopharyngeal swab specimens and should not be used as a sole basis for treatment. Nasal washings and aspirates are unacceptable for Xpert Xpress SARS-CoV-2/FLU/RSV testing.  Fact Sheet for Patients: EntrepreneurPulse.com.au  Fact Sheet for Healthcare Providers: IncredibleEmployment.be  This test is not yet approved or cleared by the  [place] FDA and has been authorized for detection and/or diagnosis of SARS-CoV-2 by FDA under an Emergency Use Authorization (EUA). This EUA will remain in effect (meaning this test can be used) for the duration of the COVID-19 declaration under Section 564(b)(1) of the Act, 21 U.S.C. section 360bbb-3(b)(1), unless the authorization is terminated or revoked.  Performed at [facility], [place] [address], [place], [place] [address]      Radiological Exams on Admission: CT ABDOMEN PELVIS W CONTRAST  Result Date: 09/27/2020 CLINICAL DATA:  Acute abdominal pain in the left lower quadrant for 1 week. HIV. Partial nephrectomy due to renal cancer. EXAM: CT ABDOMEN AND PELVIS WITH CONTRAST TECHNIQUE: Multidetector CT imaging of the abdomen and pelvis was performed using the  standard protocol following bolus administration of intravenous contrast. CONTRAST:  66mL OMNIPAQUE IOHEXOL 300 MG/ML  SOLN COMPARISON:  04/06/2015 FINDINGS: Lower chest: Unremarkable Hepatobiliary: Contracted gallbladder.  Otherwise unremarkable. Pancreas: Pancreas divisum.  No CT findings of pancreatitis. Spleen: Unremarkable Adrenals/Urinary Tract: Both adrenal glands appear normal. 1.8 by 1.5 cm fluid density lesion of the left mid kidney posteriorly compatible with cyst, not appreciably changed from prior. Stable findings of partial nephrectomy on the right. Stomach/Bowel: Acute sigmoid colon diverticulitis with considerable mesenteric stranding and suspected diverticular abscess measuring about 1.9 cm in diameter on image 69 of series 2. Although this diverticular abscess may have some minimal contained extraluminal gas, I do not see separate free intraperitoneal gas. Normal appendix. Vascular/Lymphatic: Minimal common iliac artery atherosclerotic calcification bilaterally. Small pelvic lymph nodes are likely reactive. Reproductive: Unremarkable Other: No supplemental non-categorized findings. Musculoskeletal:  Unremarkable IMPRESSION: 1. Acute sigmoid colon diverticulitis with considerable mesenteric stranding and suspected diverticular abscess measuring about 1.9 cm in diameter. Although this diverticular abscess may have some minimal contained extraluminal gas, I do not see separate free intraperitoneal gas. 2. Pancreas divisum. 3. Minimal common iliac artery atherosclerotic calcification bilaterally. Electronically Signed   By: Van Clines M.D.   On: 09/27/2020 21:09    Assessment/Plan   1. Sigmoid diverticulitis with abscess  - Presents with worsening LLQ pain despite starting ciprofloxacin and Flagyl as outpatient and is found to have acute sigmoid diverticulitis with small abscess suspected  - Continue IV antibiotics, IVF hydration, close monitoring    2. HIV - CD4 was 934 and VL undetectable in November 2021  - Continue Triumeq    3. CKD IIIa  - SCr is 1.38 on admission which appears to be his baseline  - Renally-dose medications, monitor   4. Hypertension  - BP at goal, treat as-needed only for now     DVT prophylaxis: Lovenox  Code Status: Full  Level of Care: Level of care: Med-Surg Family Communication: none present  Disposition Plan:  Patient is from: home  Anticipated d/c is to: Home  Anticipated d/c date is: 10/02/20 Patient currently: Starting treatment for diverticulitis with abscess Consults called: ED spoke with surgeon   Admission status: Inpatient     Vianne Bulls, MD [facility]  09/28/2020, 1:42 AM

## 2020-09-29 LAB — BASIC METABOLIC PANEL
Anion gap: 6 (ref 5–15)
BUN: 14 mg/dL (ref 6–20)
CO2: 25 mmol/L (ref 22–32)
Calcium: 8 mg/dL — ABNORMAL LOW (ref 8.9–10.3)
Chloride: 105 mmol/L (ref 98–111)
Creatinine, Ser: 1.39 mg/dL — ABNORMAL HIGH (ref 0.61–1.24)
GFR, Estimated: >60 mL/min (ref >60)
Glucose, Bld: 102 mg/dL — ABNORMAL HIGH (ref 70–99)
Potassium: 4.1 mmol/L (ref 3.5–5.1)
Sodium: 136 mmol/L (ref 135–145)

## 2020-09-29 LAB — CBC
HCT: 37.5 % — ABNORMAL LOW (ref 39.0–52.0)
Hemoglobin: 12.6 g/dL — ABNORMAL LOW (ref 13.0–17.0)
MCH: 32.5 pg (ref 26.0–34.0)
MCHC: 33.6 g/dL (ref 30.0–36.0)
MCV: 96.6 fL (ref 80.0–100.0)
Platelets: 232 10*3/uL (ref 150–400)
RBC: 3.88 MIL/uL — ABNORMAL LOW (ref 4.22–5.81)
RDW: 13 % (ref 11.5–15.5)
WBC: 9.7 10*3/uL (ref 4.0–10.5)
nRBC: 0 % (ref 0.0–0.2)

## 2020-09-29 NOTE — Progress Notes (Signed)
    PP:IRJJOACZY pain  Subjective: He says his abdomen feels better pain is worst with voiding or having a BM.  No real BM so far just some fluid, but it feels very uncomfortable.  We looked at his rectum, he has had some "boils," there before but nothing currently visible right now, nothing on palpation.    Objective: Vital signs in last 24 hours: Temp:  [98.1 F (36.7 C)-98.4 F (36.9 C)] 98.1 F (36.7 C) (03/03 0538) Pulse Rate:  [80-89] 86 (03/03 0538) Resp:  [16-17] 17 (03/03 0538) BP: (120-148)/(76-88) 120/80 (03/03 0538) SpO2:  [94 %-98 %] 96 % (03/03 0538) Last BM Date: 09/28/20 720 PO 1300 IV Voided x 9; BM x 2 Afebrile, VSS Creatinine   Intake/Output from previous day: 03/02 0701 - 03/03 0700 In: 2234.8 [P.O.:720; I.V.:925.3; IV Piggyback:589.6] Out: -  Intake/Output this shift: Total I/O In: 1983.3 [P.O.:360; I.V.:1528; IV Piggyback:95.3] Out: -   General appearance: alert, cooperative and no distress Resp: clear to auscultation bilaterally GI: soft, tender LLQ, skin/perirectal cleft area above anus, midline without any visible or palpable skin changes, fluctuance     Lab Results:  Recent Labs    09/28/20 0314 09/29/20 0312  WBC 11.0* 9.7  HGB 13.1 12.6*  HCT 38.9* 37.5*  PLT 228 232    BMET Recent Labs    09/28/20 0314 09/29/20 0312  NA 135 136  K 4.2 4.1  CL 105 105  CO2 22 25  GLUCOSE 93 102*  BUN 13 14  CREATININE 1.35* 1.39*  CALCIUM 8.6* 8.0*   PT/INR No results for input(s): LABPROT, INR in the last 72 hours.  Recent Labs  Lab 09/26/20 1049 09/27/20 1917  AST 17 20  ALT 22 23  ALKPHOS 74 82  BILITOT 0.6 0.4  PROT 6.9 7.7  ALBUMIN 3.4* 3.8     Lipase     Component Value Date/Time   LIPASE 41 09/27/2020 1917     Medications: . abacavir-dolutegravir-lamiVUDine  1 tablet Oral Daily  . enoxaparin (LOVENOX) injection  50 mg Subcutaneous Q24H  . escitalopram  10 mg Oral Daily  . traZODone  25 mg Oral QHS   . sodium  chloride 75 mL/hr at 09/29/20 1051  . ceFEPime (MAXIPIME) IV 2 g (09/29/20 1100)  . metronidazole Stopped (09/29/20 6063)    Assessment/Plan HIV Hx renal insuffiency  - creatinine 1.48>>1.38>>1.35>>1.39 Right renal mass/Robotic assisted laparoscopic right partial nephrectomy 08/19/13 GERD Obesity BMI 38   Diverticulitis with 1.9 cm abscess  - WBC: 10.4>>13.5>>11.0>>9.7   FEN:  IV fluids/clear liquids ID: Triumeq, daily;  Maxipime/Flagyl 3/2 >> day 2 DVT: Lovenox Follow up:  TBD   Plan:  I will put him up to full liquids, continue abx.     LOS: 2 days    Lanett Lasorsa 09/29/2020 Please see Amion

## 2020-09-29 NOTE — Plan of Care (Signed)
  Problem: Health Behavior/Discharge Planning: Goal: Ability to manage health-related needs will improve Outcome: Progressing   Problem: Pain Managment: Goal: General experience of comfort will improve Outcome: Progressing   

## 2020-09-29 NOTE — Progress Notes (Signed)
PROGRESS NOTE    Drew Cuevas  BTD:[id number] DOB: [birth year] DOA: 09/27/2020 PCP: Ladell Pier, MD    Chief Complaint  Patient presents with   Abdominal Pain    Brief Narrative:  Patient 50 year old gentleman history of renal cell carcinoma status post partial nephrectomy 2015, chronic kidney disease stage IIIa, hypertension, HIV presented to the ED with left lower quadrant abdominal pain x1 week that had worsened.  Patient evaluated in urgent care 1 day prior to admission and treated for suspected acute diverticulitis and placed on Cipro and Flagyl.  No imaging done.  Patient took 2 doses of the antibiotics and also some tramadol but due to worsening pain presented to the ED.  Patient with complaints of some subjective fevers.  Patient seen in the ED CT abdomen and pelvis done concerning for acute sigmoid diverticulitis with suspected 1.9 cm abscess.  Patient admitted, placed on IV antibiotics.  General surgery consulted.   Assessment & Plan:   Principal Problem:   Diverticulitis of large intestine with abscess Active Problems:   Human immunodeficiency virus (HIV) disease ([place])   Hypertension   Chronic kidney disease, stage 3a ([place])   1 acute sigmoid: Diverticulitis with expected diverticular abscess 1.9 cm -Patient presented with worsening left lower quadrant abdominal pain after starting Cipro and Flagyl in the outpatient setting with some subjective fevers.  CT abdomen and pelvis done concerning for acute sigmoid diverticulitis with small abscess. -Patient improving clinically with less left lower quadrant abdominal pain.  -Currently afebrile. -Leukocytosis trending down. -Continue empiric IV cefepime and IV Flagyl. -Patient seen in consultation by general surgery who feels no acute surgical intervention needed at this time and to treat conservatively with IV antibiotics, hydration, pain management and monitor over the next 24 to 48 hours. -Patient tolerating clears.   Will defer diet advancement to general surgery.  -General surgery following and appreciate input and recommendations.  2.  HIV -CD4 count of 934, viral load undetectable November 2021. -Continue Triumeq. -Outpatient follow-up  3.  Chronic kidney disease stage IIIa Renal function stable.  Currently at baseline.  4.  Hypertension Blood pressure currently stable.  Continue to hold Norvasc and likely resume in the next 24 hours.    5.  Gluteal boil Patient with complaints of boil in the crease of his gluteal region.  Patient with some tenderness in the gluteal crease however and no significant boil or induration noted.  Warm compresses for now.  On empiric IV antibiotics.  General surgery following.   DVT prophylaxis: Lovenox Code Status full Family Communication: Updated patient.  No family at bedside. Disposition:   Status is: Inpatient    Dispo: The patient is from: Home              Anticipated d/c is to: Home              Patient currently with acute diverticulitis with diverticular abscess, on IV antibiotics, on clear liquids, not stable for discharge.   Difficult to place patient no       Consultants:   General surgery: Dr. Brantley Stage 09/28/2020  Procedures:  CT abdomen and pelvis 09/27/2020    Antimicrobials:   IV cefepime 09/27/2020>>>>  IV Flagyl 09/27/2020>>>>>   Subjective: Patient laying in bed.  Stated had an episode of emesis yesterday.  No emesis today.  Overall feeling better than he did on admission with improvement in left lower quadrant abdominal pain.  Tolerating clears.  Complaining that he feels he might be developing  a boil in his gluteal crease.   Objective: Vitals:   09/28/20 0931 09/28/20 1338 09/28/20 2146 09/29/20 0538  BP: 110/80 122/76 (!) 148/88 120/80  Pulse: 79 80 89 86  Resp: 16 16 17 17   Temp: 98.1 F (36.7 C) 98.4 F (36.9 C) 98.3 F (36.8 C) 98.1 F (36.7 C)  TempSrc: Oral Oral    SpO2: 97% 94% 98% 96%  Weight:      Height:         Intake/Output Summary (Last 24 hours) at 09/29/2020 1130 Last data filed at 09/29/2020 1051 Gross per 24 hour  Intake 4218.13 ml  Output --  Net 4218.13 ml   Filed Weights   09/27/20 2312  Weight: 103.7 kg    Examination:  General exam: : NAD Respiratory system: CTA B anterior lung fields.  No wheezes, no rhonchi.  Speaking in full sentences.  Normal respiratory effort. Cardiovascular system: Regular rate and rhythm no murmurs rubs or gallops.  No JVD.  No lower extremity edema.  Gastrointestinal system: Abdomen soft, nondistended, positive bowel sounds.  Decreased tenderness to palpation left lower quadrant.  No rebound.  No guarding. Central nervous system: Alert and oriented. No focal neurological deficits. Extremities: Symmetric 5 x 5 power. Skin: No rashes, lesions or ulcers Psychiatry: Judgement and insight appear normal. Mood & affect appropriate.  Data Reviewed: I have personally reviewed following labs and imaging studies  CBC: Recent Labs  Lab 09/26/20 1049 09/27/20 1917 09/28/20 0314 09/29/20 0312  WBC 10.4 13.5* 11.0* 9.7  NEUTROABS 6.2  --   --   --   HGB 14.6 14.7 13.1 12.6*  HCT 44.1 43.5 38.9* 37.5*  MCV 96.9 96.0 96.5 96.6  PLT 275 269 228 562    Basic Metabolic Panel: Recent Labs  Lab 09/26/20 1049 09/27/20 1917 09/28/20 0314 09/29/20 0312  NA 140 138 135 136  K 4.4 4.4 4.2 4.1  CL 105 105 105 105  CO2 25 25 22 25   GLUCOSE 81 83 93 102*  BUN 9 15 13 14   CREATININE 1.48* 1.38* 1.35* 1.39*  CALCIUM 9.3 9.6 8.6* 8.0*    GFR: Estimated Creatinine Clearance: 72.1 mL/min (A) (by C-G formula based on SCr of 1.39 mg/dL (H)).  Liver Function Tests: Recent Labs  Lab 09/26/20 1049 09/27/20 1917  AST 17 20  ALT 22 23  ALKPHOS 74 82  BILITOT 0.6 0.4  PROT 6.9 7.7  ALBUMIN 3.4* 3.8    CBG: No results for input(s): GLUCAP in the last 168 hours.   Recent Results (from the past 240 hour(s))  Resp Panel by RT-PCR (Flu A&B, Covid)  Nasopharyngeal Swab     Status: None   Collection Time: 09/27/20  9:27 PM   Specimen: Nasopharyngeal Swab; Nasopharyngeal(NP) swabs in vial transport medium  Result Value Ref Range Status   SARS Coronavirus 2 by RT PCR NEGATIVE NEGATIVE Final    Comment: (NOTE) SARS-CoV-2 target nucleic acids are NOT DETECTED.  The SARS-CoV-2 RNA is generally detectable in upper respiratory specimens during the acute phase of infection. The lowest concentration of SARS-CoV-2 viral copies this assay can detect is 138 copies/mL. A negative result does not preclude SARS-Cov-2 infection and should not be used as the sole basis for treatment or other patient management decisions. A negative result may occur with  improper specimen collection/handling, submission of specimen other than nasopharyngeal swab, presence of viral mutation(s) within the areas targeted by this assay, and inadequate number of viral copies(<138 copies/mL). A negative  result must be combined with clinical observations, patient history, and epidemiological information. The expected result is Negative.  Fact Sheet for Patients:  EntrepreneurPulse.com.au  Fact Sheet for Healthcare Providers:  IncredibleEmployment.be  This test is no t yet approved or cleared by the [place] FDA and  has been authorized for detection and/or diagnosis of SARS-CoV-2 by FDA under an Emergency Use Authorization (EUA). This EUA will remain  in effect (meaning this test can be used) for the duration of the COVID-19 declaration under Section 564(b)(1) of the Act, 21 U.S.C.section 360bbb-3(b)(1), unless the authorization is terminated  or revoked sooner.       Influenza A by PCR NEGATIVE NEGATIVE Final   Influenza B by PCR NEGATIVE NEGATIVE Final    Comment: (NOTE) The Xpert Xpress SARS-CoV-2/FLU/RSV plus assay is intended as an aid in the diagnosis of influenza from Nasopharyngeal swab specimens and should not be  used as a sole basis for treatment. Nasal washings and aspirates are unacceptable for Xpert Xpress SARS-CoV-2/FLU/RSV testing.  Fact Sheet for Patients: EntrepreneurPulse.com.au  Fact Sheet for Healthcare Providers: IncredibleEmployment.be  This test is not yet approved or cleared by the [place] FDA and has been authorized for detection and/or diagnosis of SARS-CoV-2 by FDA under an Emergency Use Authorization (EUA). This EUA will remain in effect (meaning this test can be used) for the duration of the COVID-19 declaration under Section 564(b)(1) of the Act, 21 U.S.C. section 360bbb-3(b)(1), unless the authorization is terminated or revoked.  Performed at [facility], [place] [address], [place], [place] [address]          Radiology Studies: CT ABDOMEN PELVIS W CONTRAST  Result Date: 09/27/2020 CLINICAL DATA:  Acute abdominal pain in the left lower quadrant for 1 week. HIV. Partial nephrectomy due to renal cancer. EXAM: CT ABDOMEN AND PELVIS WITH CONTRAST TECHNIQUE: Multidetector CT imaging of the abdomen and pelvis was performed using the standard protocol following bolus administration of intravenous contrast. CONTRAST:  30mL OMNIPAQUE IOHEXOL 300 MG/ML  SOLN COMPARISON:  04/06/2015 FINDINGS: Lower chest: Unremarkable Hepatobiliary: Contracted gallbladder.  Otherwise unremarkable. Pancreas: Pancreas divisum.  No CT findings of pancreatitis. Spleen: Unremarkable Adrenals/Urinary Tract: Both adrenal glands appear normal. 1.8 by 1.5 cm fluid density lesion of the left mid kidney posteriorly compatible with cyst, not appreciably changed from prior. Stable findings of partial nephrectomy on the right. Stomach/Bowel: Acute sigmoid colon diverticulitis with considerable mesenteric stranding and suspected diverticular abscess measuring about 1.9 cm in diameter on image 69 of series 2. Although this diverticular abscess may have some  minimal contained extraluminal gas, I do not see separate free intraperitoneal gas. Normal appendix. Vascular/Lymphatic: Minimal common iliac artery atherosclerotic calcification bilaterally. Small pelvic lymph nodes are likely reactive. Reproductive: Unremarkable Other: No supplemental non-categorized findings. Musculoskeletal: Unremarkable IMPRESSION: 1. Acute sigmoid colon diverticulitis with considerable mesenteric stranding and suspected diverticular abscess measuring about 1.9 cm in diameter. Although this diverticular abscess may have some minimal contained extraluminal gas, I do not see separate free intraperitoneal gas. 2. Pancreas divisum. 3. Minimal common iliac artery atherosclerotic calcification bilaterally. Electronically Signed   By: Van Clines M.D.   On: 09/27/2020 21:09        Scheduled Meds:  abacavir-dolutegravir-lamiVUDine  1 tablet Oral Daily   enoxaparin (LOVENOX) injection  50 mg Subcutaneous Q24H   escitalopram  10 mg Oral Daily   traZODone  25 mg Oral QHS   Continuous Infusions:  sodium chloride 75 mL/hr at 09/29/20 1051   ceFEPime (MAXIPIME)  IV 2 g (09/29/20 1100)   metronidazole Stopped (09/29/20 0657)     LOS: 2 days    Time spent: 35 minutes    Irine Seal, MD [facility]   To contact the attending provider between 7A-7P or the covering provider during after hours 7P-7A, please log into the web site [email or web address] and access using universal [place] password for that web site. If you do not have the password, please call the hospital operator.  09/29/2020, 11:30 AM

## 2020-09-30 LAB — BASIC METABOLIC PANEL
Anion gap: 6 (ref 5–15)
BUN: 13 mg/dL (ref 6–20)
CO2: 24 mmol/L (ref 22–32)
Calcium: 8.6 mg/dL — ABNORMAL LOW (ref 8.9–10.3)
Chloride: 107 mmol/L (ref 98–111)
Creatinine, Ser: 1.34 mg/dL — ABNORMAL HIGH (ref 0.61–1.24)
GFR, Estimated: >60 mL/min (ref >60)
Glucose, Bld: 100 mg/dL — ABNORMAL HIGH (ref 70–99)
Potassium: 4.4 mmol/L (ref 3.5–5.1)
Sodium: 137 mmol/L (ref 135–145)

## 2020-09-30 LAB — CBC
HCT: 37.8 % — ABNORMAL LOW (ref 39.0–52.0)
Hemoglobin: 12.9 g/dL — ABNORMAL LOW (ref 13.0–17.0)
MCH: 32.6 pg (ref 26.0–34.0)
MCHC: 34.1 g/dL (ref 30.0–36.0)
MCV: 95.5 fL (ref 80.0–100.0)
Platelets: 243 10*3/uL (ref 150–400)
RBC: 3.96 MIL/uL — ABNORMAL LOW (ref 4.22–5.81)
RDW: 12.9 % (ref 11.5–15.5)
WBC: 9.5 10*3/uL (ref 4.0–10.5)
nRBC: 0 % (ref 0.0–0.2)

## 2020-09-30 MED ORDER — AMLODIPINE BESYLATE 10 MG PO TABS
10.0000 mg | ORAL_TABLET | Freq: Every day | ORAL | Status: DC
  Administered 2020-09-30: 10 mg via ORAL
  Filled 2020-09-30 (×2): qty 1

## 2020-09-30 MED ORDER — PRAVASTATIN SODIUM 20 MG PO TABS
40.0000 mg | ORAL_TABLET | Freq: Every day | ORAL | Status: DC
  Administered 2020-09-30: 40 mg via ORAL
  Filled 2020-09-30: qty 2

## 2020-09-30 NOTE — Progress Notes (Addendum)
    GT:XMIWOEHOZ pain  Subjective: Abdominal pain improved - no pain with PO liquids. Pelvic pressure improved. +flatus and BM. Denies nausea or emesis. Reports recurrent "boil" over upper buttock that he has a warm compress on.   Objective: Vital signs in last 24 hours: Temp:  [97.6 F (36.4 C)-98.3 F (36.8 C)] 98.3 F (36.8 C) (03/04 0530) Pulse Rate:  [68-83] 68 (03/04 0530) Resp:  [16] 16 (03/04 0530) BP: (116-135)/(73-78) 116/73 (03/04 0530) SpO2:  [97 %-98 %] 97 % (03/04 0530) Last BM Date: 09/29/20 720 PO 1300 IV Voided x 9; BM x 2 Afebrile, VSS Creatinine   Intake/Output from previous day: 03/03 0701 - 03/04 0700 In: 4551.2 [P.O.:1320; I.V.:2635.9; IV Piggyback:595.3] Out: -  Intake/Output this shift: No intake/output data recorded.  General appearance: alert, cooperative and no distress Resp: clear to auscultation bilaterally GI: soft, non-tender, +BS, there is a small, 1-1.5 cm area of tenderness ina mild induration in the left superior intergluteal cleft - no warmth, no drainage. Previous surgical scars in this area noted and appear well healing.   Lab Results:  Recent Labs    09/29/20 0312 09/30/20 0309  WBC 9.7 9.5  HGB 12.6* 12.9*  HCT 37.5* 37.8*  PLT 232 243    BMET Recent Labs    09/29/20 0312 09/30/20 0309  NA 136 137  K 4.1 4.4  CL 105 107  CO2 25 24  GLUCOSE 102* 100*  BUN 14 13  CREATININE 1.39* 1.34*  CALCIUM 8.0* 8.6*   PT/INR No results for input(s): LABPROT, INR in the last 72 hours.  Recent Labs  Lab 09/26/20 1049 09/27/20 1917  AST 17 20  ALT 22 23  ALKPHOS 74 82  BILITOT 0.6 0.4  PROT 6.9 7.7  ALBUMIN 3.4* 3.8     Lipase     Component Value Date/Time   LIPASE 41 09/27/2020 1917     Medications: . abacavir-dolutegravir-lamiVUDine  1 tablet Oral Daily  . enoxaparin (LOVENOX) injection  50 mg Subcutaneous Q24H  . escitalopram  10 mg Oral Daily  . traZODone  25 mg Oral QHS   . ceFEPime (MAXIPIME) IV 2 g  (09/30/20 1000)  . metronidazole 500 mg (09/30/20 0606)    Assessment/Plan HIV Hx renal insuffiency  - creatinine 1.48>>1.38>>1.35>>1.39>>1.34 Right renal mass/Robotic assisted laparoscopic right partial nephrectomy 08/19/13 GERD Obesity BMI 38  Diverticulitis with 1.9 cm abscess  - WBC 9.5, improved - having bowel function, tolerating liquids, pain improving  FEN:  Diet SOFT ID: Triumeq, daily;  Maxipime/Flagyl 3/2 >> day 2 DVT: Lovenox Follow up:  TBD  Plan: advance to SOFT diet. Ok to transition to PO abx. From surgical perspective, would be ok with discharge home tomorrow if tolerating solid PO and ongoing clinical improvement. Complete a total of 10-14 days of abx. Given clinical improvement and small size of abscess, I would not recommend repeat CT scan prior to discharge as I do not think it would change his treatment plan. Would recommend colonoscopy in 6-8 weeks.   LOS: 3 days    Jill Alexanders 09/30/2020 Please see Amion

## 2020-09-30 NOTE — Progress Notes (Signed)
PROGRESS NOTE    Drew Cuevas  TXM:[id number] DOB: [birth year] DOA: 09/27/2020 PCP: Ladell Pier, MD    Chief Complaint  Patient presents with  . Abdominal Pain    Brief Narrative:  Patient 50 year old gentleman history of renal cell carcinoma status post partial nephrectomy 2015, chronic kidney disease stage IIIa, hypertension, HIV presented to the ED with left lower quadrant abdominal pain x1 week that had worsened.  Patient evaluated in urgent care 1 day prior to admission and treated for suspected acute diverticulitis and placed on Cipro and Flagyl.  No imaging done.  Patient took 2 doses of the antibiotics and also some tramadol but due to worsening pain presented to the ED.  Patient with complaints of some subjective fevers.  Patient seen in the ED CT abdomen and pelvis done concerning for acute sigmoid diverticulitis with suspected 1.9 cm abscess.  Patient admitted, placed on IV antibiotics.  General surgery consulted.   Assessment & Plan:   Principal Problem:   Diverticulitis of large intestine with abscess Active Problems:   Human immunodeficiency virus (HIV) disease ([place])   Hypertension   Chronic kidney disease, stage 3a ([place])   1 acute sigmoid: Diverticulitis with expected diverticular abscess 1.9 cm -Patient presented with worsening left lower quadrant abdominal pain after starting Cipro and Flagyl in the outpatient setting with some subjective fevers.  CT abdomen and pelvis done concerning for acute sigmoid diverticulitis with small abscess. -Patient with clinical improvement.  Left lower quadrant abdominal pain improved.  -Afebrile. -Leukocytosis trending down. -Continue IV cefepime and IV Flagyl could likely transition to oral antibiotics either today or tomorrow. -Tolerating full liquid diet.  Will defer diet advancement to general surgery -Patient seen in consultation by general surgery who feels no acute surgical intervention needed at this time and to treat  conservatively with IV antibiotics, hydration, pain management . -General surgery recommending repeat CT abdomen and pelvis prior to discharge.   -General surgery following and appreciate input and recommendations.  2.  HIV -CD4 count of 934, viral load undetectable November 2021. -Continue Triumeq. -Outpatient follow-up  3.  Chronic kidney disease stage IIIa Renal function stable.  Currently at baseline.  4.  Hypertension Blood pressure stable.  Resume home regimen Norvasc.  Follow.   5.  Gluteal boil Patient with complaints of recurrent boil in the crease of his gluteal region.  Warm compresses as needed.  On empiric IV antibiotics.  General surgery following.    DVT prophylaxis: Lovenox Code Status full Family Communication: Updated patient.  No family at bedside. Disposition:   Status is: Inpatient    Dispo: The patient is from: Home              Anticipated d/c is to: Home              Patient currently with acute diverticulitis with diverticular abscess, on IV antibiotics, on full liquid diet.  Not stable for discharge.     Difficult to place patient no       Consultants:   General surgery: Dr. Brantley Stage 09/28/2020  Procedures:  CT abdomen and pelvis 09/27/2020    Antimicrobials:   IV cefepime 09/27/2020>>>>  IV Flagyl 09/27/2020>>>>>   Subjective: Patient laying in bed.  States he is feeling much better than on admission.  No nausea or emesis.  Abdominal pain improved.  No chest pain.  No shortness of breath.  Tolerated full liquid diet.  Recurrent boil in gluteal crease  Objective: Vitals:   09/29/20 4825  09/29/20 1645 09/29/20 2037 09/30/20 0530  BP: 120/80 126/74 135/78 116/73  Pulse: 86 76 83 68  Resp: 17 16 16 16   Temp: 98.1 F (36.7 C) 98.2 F (36.8 C) 97.6 F (36.4 C) 98.3 F (36.8 C)  TempSrc:  Oral Oral Oral  SpO2: 96% 98% 98% 97%  Weight:      Height:        Intake/Output Summary (Last 24 hours) at 09/30/2020 0947 Last data filed at  09/30/2020 0606 Gross per 24 hour  Intake 2685.38 ml  Output --  Net 2685.38 ml   Filed Weights   09/27/20 2312  Weight: 103.7 kg    Examination:  General exam: : NAD Respiratory system: CTA B.  No wheezes, no rhonchi.  Speaking in full sentences.  Normal respiratory effort. Cardiovascular system: Regular rate and rhythm no murmurs rubs or gallops.  No JVD.  No lower extremity edema.  Gastrointestinal system: Abdomen soft, nontender, nondistended, positive bowel sounds.  No rebound.  No guarding. Central nervous system: Alert and oriented. No focal neurological deficits. Extremities: Symmetric 5 x 5 power. Skin: No rashes, lesions or ulcers Psychiatry: Judgement and insight appear normal. Mood & affect appropriate.  Data Reviewed: I have personally reviewed following labs and imaging studies  CBC: Recent Labs  Lab 09/26/20 1049 09/27/20 1917 09/28/20 0314 09/29/20 0312 09/30/20 0309  WBC 10.4 13.5* 11.0* 9.7 9.5  NEUTROABS 6.2  --   --   --   --   HGB 14.6 14.7 13.1 12.6* 12.9*  HCT 44.1 43.5 38.9* 37.5* 37.8*  MCV 96.9 96.0 96.5 96.6 95.5  PLT 275 269 228 232 280    Basic Metabolic Panel: Recent Labs  Lab 09/26/20 1049 09/27/20 1917 09/28/20 0314 09/29/20 0312 09/30/20 0309  NA 140 138 135 136 137  K 4.4 4.4 4.2 4.1 4.4  CL 105 105 105 105 107  CO2 25 25 22 25 24   GLUCOSE 81 83 93 102* 100*  BUN 9 15 13 14 13   CREATININE 1.48* 1.38* 1.35* 1.39* 1.34*  CALCIUM 9.3 9.6 8.6* 8.0* 8.6*    GFR: Estimated Creatinine Clearance: 74.8 mL/min (A) (by C-G formula based on SCr of 1.34 mg/dL (H)).  Liver Function Tests: Recent Labs  Lab 09/26/20 1049 09/27/20 1917  AST 17 20  ALT 22 23  ALKPHOS 74 82  BILITOT 0.6 0.4  PROT 6.9 7.7  ALBUMIN 3.4* 3.8    CBG: No results for input(s): GLUCAP in the last 168 hours.   Recent Results (from the past 240 hour(s))  Resp Panel by RT-PCR (Flu A&B, Covid) Nasopharyngeal Swab     Status: None   Collection Time:  09/27/20  9:27 PM   Specimen: Nasopharyngeal Swab; Nasopharyngeal(NP) swabs in vial transport medium  Result Value Ref Range Status   SARS Coronavirus 2 by RT PCR NEGATIVE NEGATIVE Final    Comment: (NOTE) SARS-CoV-2 target nucleic acids are NOT DETECTED.  The SARS-CoV-2 RNA is generally detectable in upper respiratory specimens during the acute phase of infection. The lowest concentration of SARS-CoV-2 viral copies this assay can detect is 138 copies/mL. A negative result does not preclude SARS-Cov-2 infection and should not be used as the sole basis for treatment or other patient management decisions. A negative result may occur with  improper specimen collection/handling, submission of specimen other than nasopharyngeal swab, presence of viral mutation(s) within the areas targeted by this assay, and inadequate number of viral copies(<138 copies/mL). A negative result must be combined with  clinical observations, patient history, and epidemiological information. The expected result is Negative.  Fact Sheet for Patients:  EntrepreneurPulse.com.au  Fact Sheet for Healthcare Providers:  IncredibleEmployment.be  This test is no t yet approved or cleared by the [place] FDA and  has been authorized for detection and/or diagnosis of SARS-CoV-2 by FDA under an Emergency Use Authorization (EUA). This EUA will remain  in effect (meaning this test can be used) for the duration of the COVID-19 declaration under Section 564(b)(1) of the Act, 21 U.S.C.section 360bbb-3(b)(1), unless the authorization is terminated  or revoked sooner.       Influenza A by PCR NEGATIVE NEGATIVE Final   Influenza B by PCR NEGATIVE NEGATIVE Final    Comment: (NOTE) The Xpert Xpress SARS-CoV-2/FLU/RSV plus assay is intended as an aid in the diagnosis of influenza from Nasopharyngeal swab specimens and should not be used as a sole basis for treatment. Nasal washings  and aspirates are unacceptable for Xpert Xpress SARS-CoV-2/FLU/RSV testing.  Fact Sheet for Patients: EntrepreneurPulse.com.au  Fact Sheet for Healthcare Providers: IncredibleEmployment.be  This test is not yet approved or cleared by the [place] FDA and has been authorized for detection and/or diagnosis of SARS-CoV-2 by FDA under an Emergency Use Authorization (EUA). This EUA will remain in effect (meaning this test can be used) for the duration of the COVID-19 declaration under Section 564(b)(1) of the Act, 21 U.S.C. section 360bbb-3(b)(1), unless the authorization is terminated or revoked.  Performed at [facility], [place] [address], [place], [place] [address]          Radiology Studies: No results found.      Scheduled Meds: . abacavir-dolutegravir-lamiVUDine  1 tablet Oral Daily  . enoxaparin (LOVENOX) injection  50 mg Subcutaneous Q24H  . escitalopram  10 mg Oral Daily  . traZODone  25 mg Oral QHS   Continuous Infusions: . ceFEPime (MAXIPIME) IV 2 g (09/30/20 0330)  . metronidazole 500 mg (09/30/20 0606)     LOS: 3 days    Time spent: 35 minutes    Irine Seal, MD [facility]   To contact the attending provider between 7A-7P or the covering provider during after hours 7P-7A, please log into the web site [email or web address] and access using universal [place] password for that web site. If you do not have the password, please call the hospital operator.  09/30/2020, 9:47 AM

## 2020-10-01 ENCOUNTER — Inpatient Hospital Stay (HOSPITAL_COMMUNITY): Payer: 59

## 2020-10-01 LAB — BASIC METABOLIC PANEL
Anion gap: 6 (ref 5–15)
BUN: 14 mg/dL (ref 6–20)
CO2: 27 mmol/L (ref 22–32)
Calcium: 9.1 mg/dL (ref 8.9–10.3)
Chloride: 105 mmol/L (ref 98–111)
Creatinine, Ser: 1.39 mg/dL — ABNORMAL HIGH (ref 0.61–1.24)
GFR, Estimated: >60 mL/min (ref >60)
Glucose, Bld: 91 mg/dL (ref 70–99)
Potassium: 4.1 mmol/L (ref 3.5–5.1)
Sodium: 138 mmol/L (ref 135–145)

## 2020-10-01 LAB — CBC WITH DIFFERENTIAL/PLATELET
Abs Immature Granulocytes: 0.02 10*3/uL (ref 0.00–0.07)
Basophils Absolute: 0.1 10*3/uL (ref 0.0–0.1)
Basophils Relative: 1 %
Eosinophils Absolute: 0.3 10*3/uL (ref 0.0–0.5)
Eosinophils Relative: 3 %
HCT: 39.2 % (ref 39.0–52.0)
Hemoglobin: 13.4 g/dL (ref 13.0–17.0)
Immature Granulocytes: 0 %
Lymphocytes Relative: 37 %
Lymphs Abs: 3 10*3/uL (ref 0.7–4.0)
MCH: 32.8 pg (ref 26.0–34.0)
MCHC: 34.2 g/dL (ref 30.0–36.0)
MCV: 96.1 fL (ref 80.0–100.0)
Monocytes Absolute: 0.9 10*3/uL (ref 0.1–1.0)
Monocytes Relative: 11 %
Neutro Abs: 3.9 10*3/uL (ref 1.7–7.7)
Neutrophils Relative %: 48 %
Platelets: 273 10*3/uL (ref 150–400)
RBC: 4.08 MIL/uL — ABNORMAL LOW (ref 4.22–5.81)
RDW: 12.8 % (ref 11.5–15.5)
WBC: 8.1 10*3/uL (ref 4.0–10.5)
nRBC: 0 % (ref 0.0–0.2)

## 2020-10-01 MED ORDER — ACETAMINOPHEN 325 MG PO TABS: 650.0000 mg | ORAL_TABLET | Freq: Four times a day (QID) | ORAL | Status: DC | PRN

## 2020-10-01 MED ORDER — METRONIDAZOLE 500 MG PO TABS
500.0000 mg | ORAL_TABLET | Freq: Three times a day (TID) | ORAL | Status: DC
  Administered 2020-10-01: 500 mg via ORAL
  Filled 2020-10-01: qty 1

## 2020-10-01 MED ORDER — IOHEXOL 9 MG/ML PO SOLN
500.0000 mL | ORAL | Status: AC
  Administered 2020-10-01 (×2): 500 mL via ORAL

## 2020-10-01 MED ORDER — IOHEXOL 300 MG/ML  SOLN
100.0000 mL | Freq: Once | INTRAMUSCULAR | Status: AC | PRN
  Administered 2020-10-01: 100 mL via INTRAVENOUS

## 2020-10-01 MED ORDER — METRONIDAZOLE 500 MG PO TABS: 500.0000 mg | ORAL_TABLET | Freq: Three times a day (TID) | ORAL | 0 refills | Status: AC

## 2020-10-01 MED ORDER — CIPROFLOXACIN HCL 500 MG PO TABS
500.0000 mg | ORAL_TABLET | Freq: Two times a day (BID) | ORAL | Status: DC
  Administered 2020-10-01: 500 mg via ORAL
  Filled 2020-10-01: qty 1

## 2020-10-01 MED ORDER — CIPROFLOXACIN HCL 500 MG PO TABS: 500.0000 mg | ORAL_TABLET | Freq: Two times a day (BID) | ORAL | 0 refills | Status: AC

## 2020-10-01 NOTE — Discharge Instructions (Signed)
Diverticulitis  Diverticulitis is when small pouches in your colon (large intestine) get infected or swollen. This causes pain in the belly (abdomen) and watery poop (diarrhea). These pouches are called diverticula. The pouches form in people who have a condition called diverticulosis. What are the causes? This condition may be caused by poop (stool) that gets trapped in the pouches in your colon. The poop lets germs (bacteria) grow in the pouches. This causes the infection. What increases the risk? You are more likely to get this condition if you have small pouches in your colon. The risk is higher if:  You are overweight or very overweight (obese).  You do not exercise enough.  You drink alcohol.  You smoke or use products with tobacco in them.  You eat a diet that has a lot of red meat such as beef, pork, or lamb.  You eat a diet that does not have enough fiber in it.  You are older than 49 years of age. What are the signs or symptoms?  Pain in the belly. Pain is often on the left side, but it may be in other areas.  Fever and feeling cold.  Feeling like you may vomit.  Vomiting.  Having cramps.  Feeling full.  Changes to how often you poop.  Blood in your poop. How is this treated? Most cases are treated at home by:  Taking over-the-counter pain medicines.  Following a clear liquid diet.  Taking antibiotic medicines.  Resting. Very bad cases may need to be treated at a hospital. This may include:  Not eating or drinking.  Taking prescription pain medicine.  Getting antibiotic medicines through an IV tube.  Getting fluid and food through an IV tube.  Having surgery. When you are feeling better, your doctor may tell you to have a test to check your colon (colonoscopy). Follow these instructions at home: Medicines  Take over-the-counter and prescription medicines only as told by your doctor. These include: ? Antibiotics. ? Pain medicines. ? Fiber  pills. ? Probiotics. ? Stool softeners.  If you were prescribed an antibiotic medicine, take it as told by your doctor. Do not stop taking the antibiotic even if you start to feel better.  Ask your doctor if the medicine prescribed to you requires you to avoid driving or using machinery. Eating and drinking  Follow a diet as told by your doctor.  When you feel better, your doctor may tell you to change your diet. You may need to eat a lot of fiber. Fiber makes it easier to poop (have a bowel movement). Foods with fiber include: ? Berries. ? Beans. ? Lentils. ? Green vegetables.  Avoid eating red meat.   General instructions  Do not use any products that contain nicotine or tobacco, such as cigarettes, e-cigarettes, and chewing tobacco. If you need help quitting, ask your doctor.  Exercise 3 or more times a week. Try to get 30 minutes each time. Exercise enough to sweat and make your heart beat faster.  Keep all follow-up visits as told by your doctor. This is important. Contact a doctor if:  Your pain does not get better.  You are not pooping like normal. Get help right away if:  Your pain gets worse.  Your symptoms do not get better.  Your symptoms get worse very fast.  You have a fever.  You vomit more than one time.  You have poop that is: ? Bloody. ? Black. ? Tarry. Summary  This condition happens when   small pouches in your colon get infected or swollen.  Take medicines only as told by your doctor.  Follow a diet as told by your doctor.  Keep all follow-up visits as told by your doctor. This is important. This information is not intended to replace advice given to you by your health care provider. Make sure you discuss any questions you have with your health care provider. Document Revised: 04/27/2019 Document Reviewed: 04/27/2019 Elsevier Patient Education  2021 Elsevier Inc.  

## 2020-10-01 NOTE — Progress Notes (Signed)
   Subjective/Chief Complaint: Comfortable this morning Tolerating po Denies abdominal pain   Objective: Vital signs in last 24 hours: Temp:  [98 F (36.7 C)-98.4 F (36.9 C)] 98 F (36.7 C) (03/05 0615) Pulse Rate:  [67-80] 70 (03/05 0615) Resp:  [16-18] 17 (03/05 0615) BP: (114-136)/(79-86) 114/79 (03/05 0615) SpO2:  [96 %-99 %] 98 % (03/05 0615) Last BM Date: 09/30/20  Intake/Output from previous day: 03/04 0701 - 03/05 0700 In: 1480.1 [P.O.:780; IV Piggyback:700.1] Out: -  Intake/Output this shift: Total I/O In: 350 [P.O.:250; IV Piggyback:100] Out: -   Exam: Awake and alert Abdomen soft and non tender  Lab Results:  Recent Labs    09/30/20 0309 10/01/20 0324  WBC 9.5 8.1  HGB 12.9* 13.4  HCT 37.8* 39.2  PLT 243 273   BMET Recent Labs    09/30/20 0309 10/01/20 0324  NA 137 138  K 4.4 4.1  CL 107 105  CO2 24 27  GLUCOSE 100* 91  BUN 13 14  CREATININE 1.34* 1.39*  CALCIUM 8.6* 9.1   PT/INR No results for input(s): LABPROT, INR in the last 72 hours. ABG No results for input(s): PHART, HCO3 in the last 72 hours.  Invalid input(s): PCO2, PO2  Studies/Results: No results found.  Anti-infectives: Anti-infectives (From admission, onward)   Start     Dose/Rate Route Frequency Ordered Stop   10/01/20 1400  metroNIDAZOLE (FLAGYL) tablet 500 mg        500 mg Oral Every 8 hours 10/01/20 0929     09/28/20 1000  abacavir-dolutegravir-lamiVUDine (TRIUMEQ) 600-50-300 MG per tablet 1 tablet        1 tablet Oral Daily 09/28/20 0140     09/28/20 1000  ceFEPIme (MAXIPIME) 2 g in sodium chloride 0.9 % 100 mL IVPB        2 g 200 mL/hr over 30 Minutes Intravenous Every 8 hours 09/28/20 0932     09/28/20 0600  metroNIDAZOLE (FLAGYL) IVPB 500 mg  Status:  Discontinued        500 mg 100 mL/hr over 60 Minutes Intravenous Every 8 hours 09/28/20 0142 10/01/20 0929   09/28/20 0600  ceFEPIme (MAXIPIME) 2 g in sodium chloride 0.9 % 100 mL IVPB  Status:   Discontinued        2 g 200 mL/hr over 30 Minutes Intravenous Every 8 hours 09/28/20 0144 09/28/20 0932   09/27/20 2130  ceFEPIme (MAXIPIME) 2 g in sodium chloride 0.9 % 100 mL IVPB       "And" Linked Group Details   2 g 200 mL/hr over 30 Minutes Intravenous  Once 09/27/20 2125 09/27/20 2235   09/27/20 2130  metroNIDAZOLE (FLAGYL) IVPB 500 mg       "And" Linked Group Details   500 mg 100 mL/hr over 60 Minutes Intravenous  Once 09/27/20 2125 09/27/20 2147      Assessment/Plan: HIV Hx renal insuffiency  - creatinine 1.48>>1.38>>1.35>>1.39>>1.34 Right renal mass/Robotic assisted laparoscopic right partial nephrectomy 08/19/13 GERD Obesity BMI 38  Diverticulitis with 1.9 cm abscess  For repeat CT today.  If the scan is stable or improved, OK for discharge from a surgery standpoint.   LOS: 4 days    Drew Cuevas 10/01/2020

## 2020-10-01 NOTE — Plan of Care (Signed)
  Problem: Pain Managment: Goal: General experience of comfort will improve Outcome: Progressing   

## 2020-10-01 NOTE — Discharge Summary (Signed)
Physician Discharge Summary  Drew Cuevas NLZ:[id number] DOB: 1970/12/10 DOA: 09/27/2020  PCP: Ladell Pier, MD  Admit date: 09/27/2020 Discharge date: 10/01/2020  Time spent: 55 minutes  Recommendations for Outpatient Follow-up:  1. Follow-up with Dr. Donne Hazel, in 3 weeks. 2. Follow-up with Ladell Pier, MD in 2 to 3 weeks.  On follow-up patient will need a basic metabolic profile, magnesium level done to follow-up on electrolytes and renal function.  Will likely benefit from outpatient referral for follow-up colonoscopy.   Discharge Diagnoses:  Principal Problem:   Diverticulitis of large intestine with abscess Active Problems:   Human immunodeficiency virus (HIV) disease ([place])   Hypertension   Chronic kidney disease, stage 3a ([place])   Discharge Condition: Stable and improved  Diet recommendation: Heart healthy  Filed Weights   09/27/20 2312  Weight: 103.7 kg    History of present illness:  HPI per Dr. Lavera Guise is a 50 y.o. male with medical history significant for renal cell carcinoma status post partial nephrectomy in 2015, chronic kidney disease 3A, hypertension, and HIV, who presented to the emergency department for evaluation of abdominal pain. Patient reported that he developed some pain in the left lower quadrant of his abdomen approximately 1 week ago, has steadily worsened, and so he was evaluated at urgent care yesterday and reports that he was suspected to have acute diverticulitis and given ciprofloxacin and Flagyl. He did not have any imaging at the urgent care. Reported that he took 2 doses of the antibiotic, was also taken some tramadol, but pain became more severe, prompting his presentation to the ED today. He felt that he may have had a fever on the day of admission. Denied any cough, shortness of breath, or chest pain.  ED Course: Upon arrival to the ED, patient was found to be afebrile, saturating well on room air, and with stable blood  pressure. Chemistry panel notable for creatinine 1.38. CBC features a leukocytosis to 13,500. CT the abdomen and pelvis concerning for acute sigmoid diverticulitis with mesenteric stranding and suspected 1.9 cm abscess. COVID-19 PCR is negative. Surgery was consulted by the ED and medical admission was recommended. Patient was treated cefepime and Flagyl, 1 L of saline, Zofran, and morphine.   Hospital Course:  1 acute sigmoid: Diverticulitis with expected diverticular abscess 1.9 cm -Patient presented with worsening left lower quadrant abdominal pain after starting Cipro and Flagyl in the outpatient setting with some subjective fevers.  CT abdomen and pelvis done concerning for acute sigmoid diverticulitis with small abscess. -Patient  was admitted and placed empirically on IV cefepime and IV Flagyl.  Patient placed on IV fluids and initially placed on bowel rest.  -General surgery was consulted and followed the patient throughout the hospitalization.  -Patient improved clinically on empiric IV antibiotics IV fluids, antiemetics, pain medications.  -Diet was advanced to a full liquid diet which patient tolerated and subsequently a soft diet.  -Repeat CT abdomen and pelvis done on day of discharge with improving proximal sigmoid diverticulitis with near complete resolution of intramural gas and fluid collection.  No new enlarging abscess noted.   -Patient remained afebrile, leukocytosis trended down.   -Patient improved clinically and will be discharged home in stable and improved condition on 8 more days of oral ciprofloxacin and Flagyl.  -Outpatient follow-up with general surgery and PCP.    2.  HIV -CD4 count of 934, viral load undetectable November 2021. -Patient maintained on home regimen of Triumeq. -Outpatient follow-up  3.  Chronic kidney disease stage IIIa Remained stable throughout the hospitalization.   4.  Hypertension Blood pressure  was initially borderline on presentation as  such patients Norvasc held.  Blood pressure improved.  Norvasc was resumed.  Outpatient follow-up.    5.  Gluteal boil Patient with complaints of recurrent boil in the crease of his gluteal region.  Warm compresses as needed.  Clinical improvement.  Patient was empirically on IV antibiotics secondary to problem #1 during the hospitalization.  Outpatient follow-up.    Procedures:  CT abdomen and pelvis 09/27/2020, 10/01/2020    Consultations:  General surgery: Dr. Brantley Stage 09/28/2020   Discharge Exam: Vitals:   10/01/20 0615 10/01/20 1300  BP: 114/79 132/76  Pulse: 70 76  Resp: 17 16  Temp: 98 F (36.7 C) 98.2 F (36.8 C)  SpO2: 98% 99%    General: NAD Cardiovascular: RRR Respiratory: CTAB GI: Soft, nondistended, positive bowel sounds, minimal tenderness to palpation in lower abdomen, no rebound, no guarding.  Discharge Instructions   Discharge Instructions    Diet - low sodium heart healthy   Complete by: As directed    Increase activity slowly   Complete by: As directed      Allergies as of 10/01/2020      Reactions   Ibuprofen Anaphylaxis, Swelling, Other (See Comments)   Dehydration Swelling of the "moist areas" (throat, mouth)   Penicillins Hives      Latex Rash      Medication List    TAKE these medications   acetaminophen 325 MG tablet Commonly known as: TYLENOL Take 2 tablets (650 mg total) by mouth every 6 (six) hours as needed for mild pain (or Fever >/= 101).   albuterol 108 (90 Base) MCG/ACT inhaler Commonly known as: VENTOLIN HFA Inhale 1-2 puffs into the lungs every 6 (six) hours as needed for wheezing or shortness of breath.   amLODipine 10 MG tablet Commonly known as: NORVASC Take 1 tablet (10 mg total) by mouth daily.   ciprofloxacin 500 MG tablet Commonly known as: CIPRO Take 1 tablet (500 mg total) by mouth 2 (two) times daily for 8 days. What changed: additional instructions   escitalopram 10 MG tablet Commonly known as:  LEXAPRO TAKE 1 TABLET(10 MG) BY MOUTH DAILY What changed: See the new instructions.   lovastatin 40 MG tablet Commonly known as: MEVACOR Take 1 tablet (40 mg total) by mouth at bedtime.   metroNIDAZOLE 500 MG tablet Commonly known as: FLAGYL Take 1 tablet (500 mg total) by mouth 3 (three) times daily for 8 days. What changed: additional instructions   traZODone 50 MG tablet Commonly known as: DESYREL TAKE 1/2 TABLET(25 MG) BY MOUTH AT BEDTIME What changed: See the new instructions.   Triumeq 600-50-300 MG tablet Generic drug: abacavir-dolutegravir-lamiVUDine TAKE 1 TABLET BY MOUTH ONCE DAILY        Follow-up Information    Rolm Bookbinder, MD. Schedule an appointment as soon as possible for a visit in 3 week(s).   Specialty: General Surgery Why: call our office to make the appointment Contact information: [address] [place] [address] [phone]        Ladell Pier, MD. Schedule an appointment as soon as possible for a visit in 2 week(s).   Specialty: Internal Medicine Why: f/u in 2-3 weeks. Contact information: [place] [address] [phone]        Michel Bickers, MD .   Specialty: Infectious Diseases Contact information: [address] [place] [address] [phone]                The results of significant diagnostics from this hospitalization (including imaging, microbiology, ancillary and laboratory) are listed below for reference.    Significant Diagnostic Studies: CT ABDOMEN PELVIS W CONTRAST  Result Date: 10/01/2020 CLINICAL DATA:  Sigmoid abscess, follow-up EXAM: CT ABDOMEN AND PELVIS WITH CONTRAST TECHNIQUE: Multidetector CT imaging of the abdomen and pelvis was performed using the standard protocol following bolus administration of intravenous contrast. CONTRAST:  175mL OMNIPAQUE IOHEXOL 300 MG/ML  SOLN COMPARISON:  09/27/2020 FINDINGS: Lower chest: No pleural or  pericardial effusion. Hepatobiliary: No focal liver abnormality is seen. No gallstones, gallbladder wall thickening, or biliary dilatation. Pancreas: No pancreatic ductal dilatation or surrounding inflammatory changes. Pancreatic divisum anatomy, an anatomic variant. Spleen: Normal in size without focal abnormality. Adrenals/Urinary Tract: Adrenal glands unremarkable. 1.6 cm probable cyst, left mid kidney, stable. No hydronephrosis. Urinary bladder nondistended. Stomach/Bowel: Stomach is partially distended by ingested material. The small bowel is nondilated. Normal appendix. The colon is nondilated. Scattered descending and sigmoid diverticula. Wall thickening in the proximal sigmoid segment with some adjacent inflammatory/edematous change; the associated intramural gas and fluid collection seen previously has nearly completely resolved. No new fluid collection. Vascular/Lymphatic: No significant vascular findings are present. No enlarged abdominal or pelvic lymph nodes. Reproductive: Prostate is unremarkable. Other: Bilateral pelvic phleboliths.  No ascites.  No free air. Musculoskeletal: Small paraumbilical hernia containing only mesenteric fat. No acute or significant osseous findings. IMPRESSION: 1. Improving proximal sigmoid diverticulitis with near-complete resolution of intramural gas and fluid collection. No new or enlarging abscess. Electronically Signed   By: Lucrezia Europe M.D.   On: 10/01/2020 14:37   CT ABDOMEN PELVIS W CONTRAST  Result Date: 09/27/2020 CLINICAL DATA:  Acute abdominal pain in the left lower quadrant for 1 week. HIV. Partial nephrectomy due to renal cancer. EXAM: CT ABDOMEN AND PELVIS WITH CONTRAST TECHNIQUE: Multidetector CT imaging of the abdomen and pelvis was performed using the standard protocol following bolus administration of intravenous contrast. CONTRAST:  66mL OMNIPAQUE IOHEXOL 300 MG/ML  SOLN COMPARISON:  04/06/2015 FINDINGS: Lower chest: Unremarkable Hepatobiliary:  Contracted gallbladder.  Otherwise unremarkable. Pancreas: Pancreas divisum.  No CT findings of pancreatitis. Spleen: Unremarkable Adrenals/Urinary Tract: Both adrenal glands appear normal. 1.8 by 1.5 cm fluid density lesion of the left mid kidney posteriorly compatible with cyst, not appreciably changed from prior. Stable findings of partial nephrectomy on the right. Stomach/Bowel: Acute sigmoid colon diverticulitis with considerable mesenteric stranding and suspected diverticular abscess measuring about 1.9 cm in diameter on image 69 of series 2. Although this diverticular abscess may have some minimal contained extraluminal gas, I do not see separate free intraperitoneal gas. Normal appendix. Vascular/Lymphatic: Minimal common iliac artery atherosclerotic calcification bilaterally. Small pelvic lymph nodes are likely reactive. Reproductive: Unremarkable Other: No supplemental non-categorized findings. Musculoskeletal: Unremarkable IMPRESSION: 1. Acute sigmoid colon diverticulitis with considerable mesenteric stranding and suspected diverticular abscess measuring about 1.9 cm in diameter. Although this diverticular abscess may have some minimal contained extraluminal gas, I do not see separate free intraperitoneal gas. 2. Pancreas divisum. 3. Minimal common iliac artery atherosclerotic calcification bilaterally. Electronically Signed   By: Van Clines M.D.   On: 09/27/2020 21:09    Microbiology: Recent Results (from the past 240 hour(s))  Resp Panel by RT-PCR (Flu A&B, Covid) Nasopharyngeal Swab     Status: None   Collection Time: 09/27/20  9:27 PM   Specimen: Nasopharyngeal  Swab; Nasopharyngeal(NP) swabs in vial transport medium  Result Value Ref Range Status   SARS Coronavirus 2 by RT PCR NEGATIVE NEGATIVE Final    Comment: (NOTE) SARS-CoV-2 target nucleic acids are NOT DETECTED.  The SARS-CoV-2 RNA is generally detectable in upper respiratory specimens during the acute phase of infection.  The lowest concentration of SARS-CoV-2 viral copies this assay can detect is 138 copies/mL. A negative result does not preclude SARS-Cov-2 infection and should not be used as the sole basis for treatment or other patient management decisions. A negative result may occur with  improper specimen collection/handling, submission of specimen other than nasopharyngeal swab, presence of viral mutation(s) within the areas targeted by this assay, and inadequate number of viral copies(<138 copies/mL). A negative result must be combined with clinical observations, patient history, and epidemiological information. The expected result is Negative.  Fact Sheet for Patients:  EntrepreneurPulse.com.au  Fact Sheet for Healthcare Providers:  IncredibleEmployment.be  This test is no t yet approved or cleared by the [place] FDA and  has been authorized for detection and/or diagnosis of SARS-CoV-2 by FDA under an Emergency Use Authorization (EUA). This EUA will remain  in effect (meaning this test can be used) for the duration of the COVID-19 declaration under Section 564(b)(1) of the Act, 21 U.S.C.section 360bbb-3(b)(1), unless the authorization is terminated  or revoked sooner.       Influenza A by PCR NEGATIVE NEGATIVE Final   Influenza B by PCR NEGATIVE NEGATIVE Final    Comment: (NOTE) The Xpert Xpress SARS-CoV-2/FLU/RSV plus assay is intended as an aid in the diagnosis of influenza from Nasopharyngeal swab specimens and should not be used as a sole basis for treatment. Nasal washings and aspirates are unacceptable for Xpert Xpress SARS-CoV-2/FLU/RSV testing.  Fact Sheet for Patients: EntrepreneurPulse.com.au  Fact Sheet for Healthcare Providers: IncredibleEmployment.be  This test is not yet approved or cleared by the [place] FDA and has been authorized for detection and/or diagnosis of SARS-CoV-2 by FDA  under an Emergency Use Authorization (EUA). This EUA will remain in effect (meaning this test can be used) for the duration of the COVID-19 declaration under Section 564(b)(1) of the Act, 21 U.S.C. section 360bbb-3(b)(1), unless the authorization is terminated or revoked.  Performed at [facility], [place] [address] [place], [place] [address]      Labs: Basic Metabolic Panel: Recent Labs  Lab 09/27/20 1917 09/28/20 0314 09/29/20 0312 09/30/20 0309 10/01/20 0324  NA 138 135 136 137 138  K 4.4 4.2 4.1 4.4 4.1  CL 105 105 105 107 105  CO2 25 22 25 24 27   GLUCOSE 83 93 102* 100* 91  BUN 15 13 14 13 14   CREATININE 1.38* 1.35* 1.39* 1.34* 1.39*  CALCIUM 9.6 8.6* 8.0* 8.6* 9.1   Liver Function Tests: Recent Labs  Lab 09/26/20 1049 09/27/20 1917  AST 17 20  ALT 22 23  ALKPHOS 74 82  BILITOT 0.6 0.4  PROT 6.9 7.7  ALBUMIN 3.4* 3.8   Recent Labs  Lab 09/26/20 1049 09/27/20 1917  LIPASE 46 41   No results for input(s): AMMONIA in the last 168 hours. CBC: Recent Labs  Lab 09/26/20 1049 09/27/20 1917 09/28/20 0314 09/29/20 0312 09/30/20 0309 10/01/20 0324  WBC 10.4 13.5* 11.0* 9.7 9.5 8.1  NEUTROABS 6.2  --   --   --   --  3.9  HGB 14.6 14.7 13.1 12.6* 12.9* 13.4  HCT 44.1 43.5 38.9* 37.5* 37.8* 39.2  MCV 96.9  96.0 96.5 96.6 95.5 96.1  PLT 275 269 228 232 243 273   Cardiac Enzymes: No results for input(s): CKTOTAL, CKMB, CKMBINDEX, TROPONINI in the last 168 hours. BNP: BNP (last 3 results) No results for input(s): BNP in the last 8760 hours.  ProBNP (last 3 results) No results for input(s): PROBNP in the last 8760 hours.  CBG: No results for input(s): GLUCAP in the last 168 hours.     Signed:  Irine Seal MD.  [facility] 10/01/2020, 3:10 PM

## 2020-10-01 NOTE — Plan of Care (Signed)
Patient discharged home in stable condition 

## 2020-10-01 NOTE — Progress Notes (Signed)
Patient ID: Drew Cuevas, male   DOB: 1970-12-18, 50 y.o.   MRN: [id number]   CT scan better so ok for discharge from surgical standpoint

## 2020-10-03 ENCOUNTER — Telehealth: Payer: Self-pay

## 2020-10-03 NOTE — Telephone Encounter (Signed)
Transition Care Management Follow-up Telephone Call  Date of discharge and from where: 10/01/2020,[facility]   How have you been since you were released from the hospital? He said that he is feeling better except the antibiotics tend to upset his stomach  Any questions or concerns? Yes - noted above regarding antibiotic  Items Reviewed:  Did the pt receive and understand the discharge instructions provided? Yes   Medications obtained and verified? Yes  - he said that he has all medications and did not have any questions about his med regime.  Other? No   Any new allergies since your discharge? No   Do you have support at home? Yes   Home Care and Equipment/Supplies: Were home health services ordered? no If so, what is the name of the agency? n/a  Has the agency set up a time to come to the patient's home? not applicable Were any new equipment or medical supplies ordered?  No What is the name of the medical supply agency? n/a Were you able to get the supplies/equipment? not applicable Do you have any questions related to the use of the equipment or supplies? No  Functional Questionnaire: (I = Independent and D = Dependent) ADLs: independent  Follow up appointments reviewed:   PCP Hospital f/u appt confirmed? Yes  - Dr Wynetta Emery 10/20/2020.    [facility] f/u appt confirmed? Yes Dr Hulen Skains an [facility] 10/04/2020. Still needs follow up appointment with Dr Donne Hazel  Are transportation arrangements needed? No   If their condition worsens, is the pt aware to call PCP or go to the Emergency Dept.? Yes  Was the patient provided with contact information for the PCP's office or ED? Yes  Was to pt encouraged to call back with questions or concerns? Yes

## 2020-10-04 ENCOUNTER — Other Ambulatory Visit: Payer: Self-pay

## 2020-10-04 ENCOUNTER — Inpatient Hospital Stay: Payer: 59 | Admitting: Internal Medicine

## 2020-10-04 ENCOUNTER — Encounter: Payer: Self-pay | Admitting: General Surgery

## 2020-10-04 ENCOUNTER — Ambulatory Visit: Payer: 59 | Attending: General Surgery | Admitting: General Surgery

## 2020-10-04 VITALS — BP 139/94 | HR 83 | Temp 98.2°F | Resp 16 | Ht 66.0 in | Wt 225.2 lb

## 2020-10-04 DIAGNOSIS — L988 Other specified disorders of the skin and subcutaneous tissue: Secondary | ICD-10-CM | POA: Diagnosis not present

## 2020-10-04 DIAGNOSIS — L723 Sebaceous cyst: Secondary | ICD-10-CM

## 2020-10-04 NOTE — Patient Instructions (Signed)
The patient does not have active disease in either his pilonidal area or in the gluteal cyst.  He has agreed to consider removal of the gluteal cyst in the future, but did not want to have it removed today.

## 2020-10-04 NOTE — Progress Notes (Signed)
Cyst to L buttocks, possibly from hair bump, painful at times

## 2020-10-04 NOTE — Progress Notes (Signed)
Acute Office Visit  Subjective:    Patient ID: Drew Cuevas, male    DOB: Oct 09, 1970, 50 y.o.   MRN: [id number]  Chief Complaint  Patient presents with  . Cyst    The patient is here to evaluate possible gluteal cyst removal and pilonidal disease  Patient is in today for evaluation and possible treatment of left gluteal cyst, and also evaluation of possible pilonidal disease.  This has been going on for at least a year, on and off swelling of this left gluteal cyst, but it has never drained pus.    Past Medical History:  Diagnosis Date  . Bronchitis    LAST FLARE UP WAS NEW YEARS 2015  . Cancer ([place])    kidney  . GERD (gastroesophageal reflux disease)    NO MEDS  . HIV (human immunodeficiency virus infection) ([place])    UNDER CONTROL WITH MEDICATIONS  . Hypertension   . Immune deficiency disorder ([place])   . Renal insufficiency 05/17/2015  . Renal mass, right     Past Surgical History:  Procedure Laterality Date  . NO PAST SURGERIES    . ROBOT ASSISTED LAPAROSCOPIC NEPHRECTOMY Right 08/19/2013   Procedure: ROBOTIC ASSISTED LAPAROSCOPIC RIGHT PARTIAL NEPHRECTOMY;  Surgeon: Ardis Hughs, MD;  Location: [facility];  Service: Urology;  Laterality: Right;    Family History  Problem Relation Age of Onset  . Hypertension Mother   . Hypertension Sister   . Hypertension Brother     Social History   Socioeconomic History  . Marital status: Single    Spouse name: Not on file  . Number of children: Not on file  . Years of education: Not on file  . Highest education level: Not on file  Occupational History  . Not on file  Tobacco Use  . Smoking status: Current Every Day Smoker    Packs/day: 0.50    Years: 30.00    Pack years: 15.00    Types: Cigarettes    Last attempt to quit: 11/28/2015    Years since quitting: 4.8  . Smokeless tobacco: Never Used  . Tobacco comment: stopped May 2017  Vaping Use  . Vaping Use: Never used  Substance and Sexual Activity  .  Alcohol use: No    Comment: stopped 10-12 years ago  . Drug use: No  . Sexual activity: Not Currently    Partners: Male    Comment: declined condoms  Other Topics Concern  . Not on file  Social History Narrative  . Not on file   Social Determinants of Health   Financial Resource Strain: Not on file  Food Insecurity: Not on file  Transportation Needs: Not on file  Physical Activity: Not on file  Stress: Not on file  Social Connections: Not on file  Intimate Partner Violence: Not on file    Outpatient Medications Prior to Visit  Medication Sig Dispense Refill  . acetaminophen (TYLENOL) 325 MG tablet Take 2 tablets (650 mg total) by mouth every 6 (six) hours as needed for mild pain (or Fever >/= 101).    Marland Kitchen albuterol (VENTOLIN HFA) 108 (90 Base) MCG/ACT inhaler Inhale 1-2 puffs into the lungs every 6 (six) hours as needed for wheezing or shortness of breath.    Marland Kitchen amLODipine (NORVASC) 10 MG tablet Take 1 tablet (10 mg total) by mouth daily. 30 tablet 6  . ciprofloxacin (CIPRO) 500 MG tablet Take 1 tablet (500 mg total) by mouth 2 (two) times daily for 8 days. 16 tablet  0  . escitalopram (LEXAPRO) 10 MG tablet TAKE 1 TABLET(10 MG) BY MOUTH DAILY (Patient taking differently: Take 10 mg by mouth daily.) 30 tablet 0  . lovastatin (MEVACOR) 40 MG tablet Take 1 tablet (40 mg total) by mouth at bedtime. 90 tablet 1  . metroNIDAZOLE (FLAGYL) 500 MG tablet Take 1 tablet (500 mg total) by mouth 3 (three) times daily for 8 days. 24 tablet 0  . traZODone (DESYREL) 50 MG tablet TAKE 1/2 TABLET(25 MG) BY MOUTH AT BEDTIME (Patient taking differently: Take 25 mg by mouth at bedtime.) 15 tablet 0  . TRIUMEQ 600-50-300 MG tablet TAKE 1 TABLET BY MOUTH ONCE DAILY (Patient taking differently: Take 1 tablet by mouth daily.) 30 tablet 3   No facility-administered medications prior to visit.     Review of Systems  All other systems reviewed and are negative.      Objective:    Physical  Exam Musculoskeletal:       Legs:     BP (!) 139/94 (BP Location: Right Arm, Patient Position: Sitting, Cuff Size: Normal)   Pulse 83   Temp 98.2 F (36.8 C) (Oral)   Resp 16   Ht 5\' 6"  (1.676 m)   Wt 225 lb 3.2 oz (102.2 kg)   SpO2 98%   BMI 36.35 kg/m  Wt Readings from Last 3 Encounters:  10/04/20 225 lb 3.2 oz (102.2 kg)  09/27/20 228 lb 9.9 oz (103.7 kg)  09/01/20 233 lb 9.6 oz (106 kg)    Health Maintenance Due  Topic Date Due  . FOOT EXAM  Never done  . OPHTHALMOLOGY EXAM  Never done  . URINE MICROALBUMIN  Never done  . COLONOSCOPY (Pts 45-62yrs Insurance coverage will need to be confirmed)  Never done  . HEMOGLOBIN A1C  07/15/2018    There are no preventive care reminders to display for this patient.   No results found for: TSH Lab Results  Component Value Date   WBC 8.1 10/01/2020   HGB 13.4 10/01/2020   HCT 39.2 10/01/2020   MCV 96.1 10/01/2020   PLT 273 10/01/2020   Lab Results  Component Value Date   NA 138 10/01/2020   K 4.1 10/01/2020   CO2 27 10/01/2020   GLUCOSE 91 10/01/2020   BUN 14 10/01/2020   CREATININE 1.39 (H) 10/01/2020   BILITOT 0.4 09/27/2020   ALKPHOS 82 09/27/2020   AST 20 09/27/2020   ALT 23 09/27/2020   PROT 7.7 09/27/2020   ALBUMIN 3.8 09/27/2020   CALCIUM 9.1 10/01/2020   ANIONGAP 6 10/01/2020   Lab Results  Component Value Date   CHOL 201 (H) 06/07/2020   Lab Results  Component Value Date   HDL 38 (L) 06/07/2020   Lab Results  Component Value Date   LDLCALC 144 (H) 06/07/2020   Lab Results  Component Value Date   TRIG 83 06/07/2020   Lab Results  Component Value Date   CHOLHDL 5.3 (H) 06/07/2020   Lab Results  Component Value Date   HGBA1C 5.4 01/13/2018       Assessment & Plan:   Problem List Items Addressed This Visit   None   Visit Diagnoses    Pilonidal disease    -  Primary   Sebaceous cyst           No orders of the defined types were placed in this encounter.    Judeth Horn,  MD

## 2020-10-21 ENCOUNTER — Ambulatory Visit: Payer: 59 | Admitting: Internal Medicine

## 2020-11-04 ENCOUNTER — Inpatient Hospital Stay: Payer: 59 | Admitting: Internal Medicine

## 2020-11-24 ENCOUNTER — Other Ambulatory Visit: Payer: Self-pay | Admitting: Internal Medicine

## 2020-11-24 DIAGNOSIS — G4709 Other insomnia: Secondary | ICD-10-CM

## 2020-11-24 NOTE — Telephone Encounter (Signed)
Requested Prescriptions  Pending Prescriptions Disp Refills  . traZODone (DESYREL) 50 MG tablet [Pharmacy Med Name: TRAZODONE 50MG  TABLETS] 15 tablet 0    Sig: TAKE 1/2 TABLET(25 MG) BY MOUTH AT BEDTIME     Psychiatry: Antidepressants - Serotonin Modulator Passed - 11/24/2020  6:22 AM      Passed - Completed PHQ-2 or PHQ-9 in the last 360 days      Passed - Valid encounter within last 6 months    Recent Outpatient Visits          1 month ago Pilonidal disease   [facility] Judeth Horn, MD   2 months ago Essential hypertension   [place], MD   4 months ago Appointment canceled by hospital   [place], Deborah B, MD   1 year ago Essential hypertension   [place], Deborah B, MD   1 year ago Essential hypertension   [place], Deborah B, MD      Future Appointments            In 1 month Wynetta Emery, Dalbert Batman, MD [place]

## 2020-12-24 ENCOUNTER — Other Ambulatory Visit: Payer: Self-pay | Admitting: Internal Medicine

## 2020-12-24 DIAGNOSIS — G4709 Other insomnia: Secondary | ICD-10-CM

## 2020-12-30 ENCOUNTER — Encounter: Payer: Self-pay | Admitting: Internal Medicine

## 2020-12-30 ENCOUNTER — Other Ambulatory Visit: Payer: Self-pay

## 2020-12-30 ENCOUNTER — Ambulatory Visit: Payer: 59 | Attending: Internal Medicine | Admitting: Internal Medicine

## 2020-12-30 VITALS — BP 128/90 | HR 97 | Resp 16 | Wt 232.4 lb

## 2020-12-30 DIAGNOSIS — F17200 Nicotine dependence, unspecified, uncomplicated: Secondary | ICD-10-CM

## 2020-12-30 DIAGNOSIS — M25512 Pain in left shoulder: Secondary | ICD-10-CM | POA: Diagnosis not present

## 2020-12-30 DIAGNOSIS — I10 Essential (primary) hypertension: Secondary | ICD-10-CM | POA: Diagnosis not present

## 2020-12-30 DIAGNOSIS — E785 Hyperlipidemia, unspecified: Secondary | ICD-10-CM | POA: Diagnosis not present

## 2020-12-30 DIAGNOSIS — Z8719 Personal history of other diseases of the digestive system: Secondary | ICD-10-CM

## 2020-12-30 DIAGNOSIS — Z1211 Encounter for screening for malignant neoplasm of colon: Secondary | ICD-10-CM

## 2020-12-30 NOTE — Patient Instructions (Signed)
You will be called with the appointment to see the gastroenterologist. Please check your blood pressure at least twice a week and record the numbers.  We will have you follow-up with the clinical pharmacist in 3 weeks for repeat blood pressure check.

## 2020-12-30 NOTE — Progress Notes (Signed)
Patient ID: Drew Cuevas, male    DOB: [birth year]  MRN: [id number]  CC: Hypertension   Subjective: Cornie Cuevas is a 50 y.o. male who presents for chronic disease management His concerns today include:  HTN, HL, HIV,OA RT knee, rectal fistulectomy, Renal cell CAs/ppartial nephrology, depression, CKD stage2-3, .past hx of DM cured  Since last visit with me, patient was admitted to the hospital in March of this year with left lower quadrant abdominal pain.  Found to have sigmoid diverticulitis with small diverticular abscess.  Treated with antibiotics and conservative measures.  He has since followed up with the surgeon.  States he was told to follow-up with them if he has any recurrent episodes.  He is overdue for colonoscopy for colon cancer screening.  He is agreeable to referral.  HYPERTENSION Currently taking: see medication list.  He is on Norvasc 10 mg daily which he takes at bedtime. Med Adherence: [x]  Yes    []  No Medication side effects: []  Yes    [x]  No Adherence with salt restriction: [x]  Yes    []  No Home Monitoring?: [x]  Yes  But not in last 2 wks Monitoring Frequency: []  Yes    []  No Home BP results range: []  Yes    []  No SOB? []  Yes    [x]  No Chest Pain?: []  Yes    [x]  No Leg swelling?: []  Yes    [x]  No Headaches?: []  Yes    [x]  No Dizziness? []  Yes    [x]  No Comments:   HL:  Taking and tolerating Mevcor  Tob dep: He was down to 3 to 4 cigarettes a day but has been smoking more over the past several weeks.  Currently smoking 6 to 7 cigarettes a day.  Reports some stress at work.   Complains of pain LT shoulder with certain movements especially when he elevates it to a certain level.  This has been going on for a few weeks.  Initially started in the left wrist and now feels like it is in the shoulder.  No initiating factors.    Patient Active Problem List   Diagnosis Date Noted  . Diverticulitis of large intestine with abscess 09/27/2020  . Depression 06/19/2019   . Other insomnia 06/19/2019  . Chronic kidney disease, stage 3a ([place]) 04/15/2018  . Tobacco dependence 01/13/2018  . Primary osteoarthritis of right knee 04/03/2017  . Dyslipidemia 06/14/2016  . Renal cell carcinoma of right kidney ([place]) 08/19/2013  . Renal cyst 05/21/2013  . Obesity (BMI 30-39.9) 05/26/2012  . Internal hemorrhoids 12/01/2008  . Hypertension 04/28/2008  . Major depressive disorder, single episode, severe ([place]) 12/20/2006  . Human immunodeficiency virus (HIV) disease ([place]) 08/28/2006     Current Outpatient Medications on File Prior to Visit  Medication Sig Dispense Refill  . acetaminophen (TYLENOL) 325 MG tablet Take 2 tablets (650 mg total) by mouth every 6 (six) hours as needed for mild pain (or Fever >/= 101).    Marland Kitchen albuterol (VENTOLIN HFA) 108 (90 Base) MCG/ACT inhaler Inhale 1-2 puffs into the lungs every 6 (six) hours as needed for wheezing or shortness of breath.    Marland Kitchen amLODipine (NORVASC) 10 MG tablet Take 1 tablet (10 mg total) by mouth daily. 30 tablet 6  . escitalopram (LEXAPRO) 10 MG tablet TAKE 1 TABLET(10 MG) BY MOUTH DAILY (Patient taking differently: Take 10 mg by mouth daily.) 30 tablet 0  . lovastatin (MEVACOR) 40 MG tablet Take 1 tablet (40 mg total) by mouth  at bedtime. 90 tablet 1  . traZODone (DESYREL) 50 MG tablet TAKE 1/2 TABLET(25 MG) BY MOUTH AT BEDTIME 15 tablet 0  . TRIUMEQ 600-50-300 MG tablet TAKE 1 TABLET BY MOUTH ONCE DAILY (Patient taking differently: Take 1 tablet by mouth daily.) 30 tablet 3   No current facility-administered medications on file prior to visit.    Allergies  Allergen Reactions  . Ibuprofen Anaphylaxis, Swelling and Other (See Comments)    Dehydration Swelling of the "moist areas" (throat, mouth)  . Penicillins Hives    Has patient had a PCN reaction causing immediate rash, facial/tongue/throat swelling, SOB or lightheadedness with hypotension: yes Has patient had a PCN reaction causing severe rash involving mucus  membranes or skin necrosis: no-- pt had hives Has patient had a PCN reaction that required hospitalization no Has patient had a PCN reaction occurring within the last 10 years: no If all of the above answers are "NO", then may proceed with Cephalosporin use.   . Latex Rash    Social History   Socioeconomic History  . Marital status: Single    Spouse name: Not on file  . Number of children: Not on file  . Years of education: Not on file  . Highest education level: Not on file  Occupational History  . Not on file  Tobacco Use  . Smoking status: Current Every Day Smoker    Packs/day: 0.50    Years: 30.00    Pack years: 15.00    Types: Cigarettes    Last attempt to quit: 11/28/2015    Years since quitting: 5.0  . Smokeless tobacco: Never Used  . Tobacco comment: stopped May 2017  Vaping Use  . Vaping Use: Never used  Substance and Sexual Activity  . Alcohol use: No    Comment: stopped 10-12 years ago  . Drug use: No  . Sexual activity: Not Currently    Partners: Male    Comment: declined condoms  Other Topics Concern  . Not on file  Social History Narrative  . Not on file   Social Determinants of Health   Financial Resource Strain: Not on file  Food Insecurity: Not on file  Transportation Needs: Not on file  Physical Activity: Not on file  Stress: Not on file  Social Connections: Not on file  Intimate Partner Violence: Not on file    Family History  Problem Relation Age of Onset  . Hypertension Mother   . Hypertension Sister   . Hypertension Brother     Past Surgical History:  Procedure Laterality Date  . NO PAST SURGERIES    . ROBOT ASSISTED LAPAROSCOPIC NEPHRECTOMY Right 08/19/2013   Procedure: ROBOTIC ASSISTED LAPAROSCOPIC RIGHT PARTIAL NEPHRECTOMY;  Surgeon: Ardis Hughs, MD;  Location: [facility];  Service: Urology;  Laterality: Right;    ROS: Review of Systems Negative except as stated above  PHYSICAL EXAM: BP 128/90   Pulse 97   Resp 16    Wt 232 lb 6.4 oz (105.4 kg)   SpO2 98%   BMI 37.51 kg/m   Physical Exam  General appearance - alert, well appearing, and in no distress Mental status - normal mood, behavior, speech, dress, motor activity, and thought processes Neck - supple, no significant adenopathy Chest - clear to auscultation, no wheezes, rales or rhonchi, symmetric air entry Heart - normal rate, regular rhythm, normal S1, S2, no murmurs, rubs, clicks or gallops Musculoskeletal -left shoulder: No point tenderness.  No crepitus felt on passive range of motion.  He has pretty good passive range of motion including elevation.  Drop arm test is negative. Extremities -no lower extremity edema.  CMP Latest Ref Rng & Units 10/01/2020 09/30/2020 09/29/2020  Glucose 70 - 99 mg/dL 91 100(H) 102(H)  BUN 6 - 20 mg/dL 14 13 14   Creatinine 0.61 - 1.24 mg/dL 1.39(H) 1.34(H) 1.39(H)  Sodium 135 - 145 mmol/L 138 137 136  Potassium 3.5 - 5.1 mmol/L 4.1 4.4 4.1  Chloride 98 - 111 mmol/L 105 107 105  CO2 22 - 32 mmol/L 27 24 25   Calcium 8.9 - 10.3 mg/dL 9.1 8.6(L) 8.0(L)  Total Protein 6.5 - 8.1 g/dL - - -  Total Bilirubin 0.3 - 1.2 mg/dL - - -  Alkaline Phos 38 - 126 U/L - - -  AST 15 - 41 U/L - - -  ALT 0 - 44 U/L - - -   Lipid Panel     Component Value Date/Time   CHOL 201 (H) 06/07/2020 0948   CHOL 201 (H) 01/13/2018 1653   TRIG 83 06/07/2020 0948   HDL 38 (L) 06/07/2020 0948   HDL 35 (L) 01/13/2018 1653   CHOLHDL 5.3 (H) 06/07/2020 0948   VLDL 54 (H) 11/07/2016 1507   LDLCALC 144 (H) 06/07/2020 0948    CBC    Component Value Date/Time   WBC 8.1 10/01/2020 0324   RBC 4.08 (L) 10/01/2020 0324   HGB 13.4 10/01/2020 0324   HGB 15.3 01/13/2018 1653   HCT 39.2 10/01/2020 0324   HCT 44.9 01/13/2018 1653   PLT 273 10/01/2020 0324   PLT 234 01/13/2018 1653   MCV 96.1 10/01/2020 0324   MCV 96 01/13/2018 1653   MCH 32.8 10/01/2020 0324   MCHC 34.2 10/01/2020 0324   RDW 12.8 10/01/2020 0324   RDW 15.0 01/13/2018 1653    LYMPHSABS 3.0 10/01/2020 0324   MONOABS 0.9 10/01/2020 0324   EOSABS 0.3 10/01/2020 0324   BASOSABS 0.1 10/01/2020 0324    ASSESSMENT AND PLAN:  1. Essential hypertension Repeat blood pressure today much better but not at goal. -Advised to check blood pressure at least twice a week and record the readings.  Follow-up with clinical pharmacist in 3 weeks for recheck.  Continue Norvasc 10 mg daily.  2. Dyslipidemia Continue Mevacor  3. Tobacco dependence Advised to quit.  He is aware of the health risks associated with smoking.  Expressed desire to quit and states that he will continue to work on this on his own.  I suggested cutting down by 1 cigarette/week and setting a quit date.  We will reassess on follow-up visit.  4. Acute pain of left shoulder Questionable subacromial bursitis or tendinitis.  He is allergic to ibuprofen.  I recommend taking Tylenol as needed.  5. History of diverticular abscess 6. Screening for colon cancer - Ambulatory referral to Gastroenterology    Patient was given the opportunity to ask questions.  Patient verbalized understanding of the plan and was able to repeat key elements of the plan.   Orders Placed This Encounter  Procedures  . Ambulatory referral to Gastroenterology     Requested Prescriptions    No prescriptions requested or ordered in this encounter    Return in about 4 months (around 05/01/2021) for Give appt with [facility] in 23 wks for BP recheck.  Karle Plumber, MD, FACP

## 2021-01-02 ENCOUNTER — Encounter (HOSPITAL_COMMUNITY): Payer: Self-pay | Admitting: Emergency Medicine

## 2021-01-02 ENCOUNTER — Inpatient Hospital Stay (HOSPITAL_COMMUNITY)
Admission: EM | Admit: 2021-01-02 | Discharge: 2021-01-05 | DRG: 177 | Disposition: A | Discharge status: Home / Self Care | Payer: 59 | Attending: Internal Medicine | Admitting: Internal Medicine

## 2021-01-02 ENCOUNTER — Other Ambulatory Visit: Payer: Self-pay

## 2021-01-02 DIAGNOSIS — Z7902 Long term (current) use of antithrombotics/antiplatelets: Secondary | ICD-10-CM

## 2021-01-02 DIAGNOSIS — I129 Hypertensive chronic kidney disease with stage 1 through stage 4 chronic kidney disease, or unspecified chronic kidney disease: Secondary | ICD-10-CM | POA: Diagnosis present

## 2021-01-02 DIAGNOSIS — Z886 Allergy status to analgesic agent status: Secondary | ICD-10-CM

## 2021-01-02 DIAGNOSIS — N1831 Chronic kidney disease, stage 3a: Secondary | ICD-10-CM | POA: Diagnosis present

## 2021-01-02 DIAGNOSIS — I63442 Cerebral infarction due to embolism of left cerebellar artery: Secondary | ICD-10-CM | POA: Diagnosis present

## 2021-01-02 DIAGNOSIS — Z8249 Family history of ischemic heart disease and other diseases of the circulatory system: Secondary | ICD-10-CM

## 2021-01-02 DIAGNOSIS — R29702 NIHSS score 2: Secondary | ICD-10-CM | POA: Diagnosis present

## 2021-01-02 DIAGNOSIS — Z85528 Personal history of other malignant neoplasm of kidney: Secondary | ICD-10-CM

## 2021-01-02 DIAGNOSIS — Z6837 Body mass index (BMI) 37.0-37.9, adult: Secondary | ICD-10-CM

## 2021-01-02 DIAGNOSIS — E669 Obesity, unspecified: Secondary | ICD-10-CM | POA: Diagnosis present

## 2021-01-02 DIAGNOSIS — F17200 Nicotine dependence, unspecified, uncomplicated: Secondary | ICD-10-CM | POA: Diagnosis present

## 2021-01-02 DIAGNOSIS — F32A Depression, unspecified: Secondary | ICD-10-CM | POA: Diagnosis present

## 2021-01-02 DIAGNOSIS — G43909 Migraine, unspecified, not intractable, without status migrainosus: Secondary | ICD-10-CM | POA: Diagnosis present

## 2021-01-02 DIAGNOSIS — F419 Anxiety disorder, unspecified: Secondary | ICD-10-CM | POA: Diagnosis present

## 2021-01-02 DIAGNOSIS — F17210 Nicotine dependence, cigarettes, uncomplicated: Secondary | ICD-10-CM | POA: Diagnosis present

## 2021-01-02 DIAGNOSIS — E876 Hypokalemia: Secondary | ICD-10-CM | POA: Diagnosis present

## 2021-01-02 DIAGNOSIS — E785 Hyperlipidemia, unspecified: Secondary | ICD-10-CM | POA: Diagnosis present

## 2021-01-02 DIAGNOSIS — U071 COVID-19: Secondary | ICD-10-CM | POA: Diagnosis not present

## 2021-01-02 DIAGNOSIS — I639 Cerebral infarction, unspecified: Secondary | ICD-10-CM

## 2021-01-02 DIAGNOSIS — Z905 Acquired absence of kidney: Secondary | ICD-10-CM

## 2021-01-02 DIAGNOSIS — I10 Essential (primary) hypertension: Secondary | ICD-10-CM | POA: Diagnosis present

## 2021-01-02 DIAGNOSIS — Z91040 Latex allergy status: Secondary | ICD-10-CM

## 2021-01-02 DIAGNOSIS — Z87892 Personal history of anaphylaxis: Secondary | ICD-10-CM

## 2021-01-02 DIAGNOSIS — Z21 Asymptomatic human immunodeficiency virus [HIV] infection status: Secondary | ICD-10-CM | POA: Diagnosis present

## 2021-01-02 DIAGNOSIS — R270 Ataxia, unspecified: Secondary | ICD-10-CM | POA: Diagnosis present

## 2021-01-02 DIAGNOSIS — Z79899 Other long term (current) drug therapy: Secondary | ICD-10-CM

## 2021-01-02 DIAGNOSIS — D688 Other specified coagulation defects: Secondary | ICD-10-CM | POA: Diagnosis present

## 2021-01-02 DIAGNOSIS — Z823 Family history of stroke: Secondary | ICD-10-CM

## 2021-01-02 DIAGNOSIS — B20 Human immunodeficiency virus [HIV] disease: Secondary | ICD-10-CM | POA: Diagnosis present

## 2021-01-02 DIAGNOSIS — Z880 Allergy status to penicillin: Secondary | ICD-10-CM

## 2021-01-02 LAB — CBC
HCT: 46.4 % (ref 39.0–52.0)
Hemoglobin: 16 g/dL (ref 13.0–17.0)
MCH: 32.7 pg (ref 26.0–34.0)
MCHC: 34.5 g/dL (ref 30.0–36.0)
MCV: 94.9 fL (ref 80.0–100.0)
Platelets: 191 10*3/uL (ref 150–400)
RBC: 4.89 MIL/uL (ref 4.22–5.81)
RDW: 13.8 % (ref 11.5–15.5)
WBC: 7.4 10*3/uL (ref 4.0–10.5)
nRBC: 0 % (ref 0.0–0.2)

## 2021-01-02 LAB — COMPREHENSIVE METABOLIC PANEL
ALT: 29 U/L (ref 0–44)
AST: 20 U/L (ref 15–41)
Albumin: 4.2 g/dL (ref 3.5–5.0)
Alkaline Phosphatase: 82 U/L (ref 38–126)
Anion gap: 7 (ref 5–15)
BUN: 11 mg/dL (ref 6–20)
CO2: 25 mmol/L (ref 22–32)
Calcium: 9.1 mg/dL (ref 8.9–10.3)
Chloride: 105 mmol/L (ref 98–111)
Creatinine, Ser: 1.23 mg/dL (ref 0.61–1.24)
GFR, Estimated: >60 mL/min (ref >60)
Glucose, Bld: 94 mg/dL (ref 70–99)
Potassium: 3.3 mmol/L — ABNORMAL LOW (ref 3.5–5.1)
Sodium: 137 mmol/L (ref 135–145)
Total Bilirubin: 0.4 mg/dL (ref 0.3–1.2)
Total Protein: 8.1 g/dL (ref 6.5–8.1)

## 2021-01-02 LAB — LIPASE, BLOOD: Lipase: 46 U/L (ref 11–51)

## 2021-01-02 NOTE — ED Triage Notes (Signed)
Patient complaining of Headaches, cant walk per patient, and very nauseated. Patient states it started 4 days ago.

## 2021-01-02 NOTE — ED Provider Notes (Signed)
Emergency Medicine Provider Triage Evaluation Note  Drew Cuevas , a 50 y.o. male  was evaluated in triage.  Pt complains of 3 days of ongoing headache, dizziness, fatigue feels very nauseous and occasionally lightheaded.  He states he has vomited more times than he can count.  Denies any diarrhea.  He states the symptoms began with a head cold which she describes as congestion and slight coughing.  He denies any purulent sputum production.  Denies any fevers or chills.  Uncertain of any COVID exposures states that he does not member being around anyone sick recently.  No chest pain or shortness of breath.  Review of Systems  Positive: Nausea, lightheaded/dizzy/fatigue, vomiting Negative: Fevers  Physical Exam  BP (!) 162/102 (BP Location: Right Arm)   Pulse 78   Temp 99.2 F (37.3 C) (Oral)   Resp 14   Ht 5\' 6"  (1.676 m)   Wt 105.7 kg   SpO2 97%   BMI 37.61 kg/m  Gen:   Awake, no distress but somewhat uncomfortable laying in waiting room chair with legs up on other chair. Resp:  Normal effort  MSK:   Moves extremities without difficulty.  Bilateral grip strength 5/5 with flexion extension of elbows 5/5.  Bilateral lower extremities with 5/5 strength.  Smile symmetric.  Extraocular movements intact.  No obvious nystagmus. Other:    Medical Decision Making  Medically screening exam initiated at 9:28 PM.  Appropriate orders placed.  Jatniel Verastegui was informed that the remainder of the evaluation will be completed by another provider, this initial triage assessment does not replace that evaluation, and the importance of remaining in the ED until their evaluation is complete.  Patient with.  His symptoms consistent with viral process some concern for peripheral vertigo however as patient seems to be complaining of both lightheadedness and dizziness.  He is neurologically intact doubt central process.  Will obtain COVID and EKG along with patient's already obtained lab work.  CBC without  leukocytosis or anemia lipase within normal limits and CMP unremarkable apart from mild hypokalemia.  Patient agreeable to wait in ER for placement in major care.   Tedd Sias, [place] 01/02/21 2131    Isla Pence, MD 01/02/21 2140

## 2021-01-02 NOTE — ED Triage Notes (Signed)
Per EMS-states nauseated-came home from work due to not feeling well-fell last night-multiple complaints-has not taken his HTN meds

## 2021-01-02 NOTE — ED Notes (Signed)
Pt assisted to go to the bathroom. He was unable to provide a urine specimen. He said I'm going to pee all over my hands.

## 2021-01-03 ENCOUNTER — Emergency Department (HOSPITAL_COMMUNITY): Payer: 59

## 2021-01-03 DIAGNOSIS — Z905 Acquired absence of kidney: Secondary | ICD-10-CM | POA: Diagnosis not present

## 2021-01-03 DIAGNOSIS — Z87892 Personal history of anaphylaxis: Secondary | ICD-10-CM | POA: Diagnosis not present

## 2021-01-03 DIAGNOSIS — Z85528 Personal history of other malignant neoplasm of kidney: Secondary | ICD-10-CM | POA: Diagnosis not present

## 2021-01-03 DIAGNOSIS — U071 COVID-19: Secondary | ICD-10-CM | POA: Diagnosis present

## 2021-01-03 DIAGNOSIS — Z21 Asymptomatic human immunodeficiency virus [HIV] infection status: Secondary | ICD-10-CM | POA: Diagnosis present

## 2021-01-03 DIAGNOSIS — Z886 Allergy status to analgesic agent status: Secondary | ICD-10-CM | POA: Diagnosis not present

## 2021-01-03 DIAGNOSIS — Z6837 Body mass index (BMI) 37.0-37.9, adult: Secondary | ICD-10-CM | POA: Diagnosis not present

## 2021-01-03 DIAGNOSIS — N1831 Chronic kidney disease, stage 3a: Secondary | ICD-10-CM | POA: Diagnosis present

## 2021-01-03 DIAGNOSIS — E785 Hyperlipidemia, unspecified: Secondary | ICD-10-CM | POA: Diagnosis present

## 2021-01-03 DIAGNOSIS — I129 Hypertensive chronic kidney disease with stage 1 through stage 4 chronic kidney disease, or unspecified chronic kidney disease: Secondary | ICD-10-CM | POA: Diagnosis present

## 2021-01-03 DIAGNOSIS — I10 Essential (primary) hypertension: Secondary | ICD-10-CM

## 2021-01-03 DIAGNOSIS — R270 Ataxia, unspecified: Secondary | ICD-10-CM | POA: Diagnosis present

## 2021-01-03 DIAGNOSIS — E876 Hypokalemia: Secondary | ICD-10-CM | POA: Diagnosis present

## 2021-01-03 DIAGNOSIS — E669 Obesity, unspecified: Secondary | ICD-10-CM | POA: Diagnosis present

## 2021-01-03 DIAGNOSIS — I6389 Other cerebral infarction: Secondary | ICD-10-CM | POA: Diagnosis not present

## 2021-01-03 DIAGNOSIS — Z91040 Latex allergy status: Secondary | ICD-10-CM | POA: Diagnosis not present

## 2021-01-03 DIAGNOSIS — Z880 Allergy status to penicillin: Secondary | ICD-10-CM | POA: Diagnosis not present

## 2021-01-03 DIAGNOSIS — Z79899 Other long term (current) drug therapy: Secondary | ICD-10-CM | POA: Diagnosis not present

## 2021-01-03 DIAGNOSIS — Z7902 Long term (current) use of antithrombotics/antiplatelets: Secondary | ICD-10-CM | POA: Diagnosis not present

## 2021-01-03 DIAGNOSIS — F17210 Nicotine dependence, cigarettes, uncomplicated: Secondary | ICD-10-CM | POA: Diagnosis present

## 2021-01-03 DIAGNOSIS — F32A Depression, unspecified: Secondary | ICD-10-CM | POA: Diagnosis present

## 2021-01-03 DIAGNOSIS — G43909 Migraine, unspecified, not intractable, without status migrainosus: Secondary | ICD-10-CM | POA: Diagnosis present

## 2021-01-03 DIAGNOSIS — R29702 NIHSS score 2: Secondary | ICD-10-CM | POA: Diagnosis present

## 2021-01-03 DIAGNOSIS — D688 Other specified coagulation defects: Secondary | ICD-10-CM | POA: Diagnosis present

## 2021-01-03 DIAGNOSIS — B20 Human immunodeficiency virus [HIV] disease: Secondary | ICD-10-CM

## 2021-01-03 DIAGNOSIS — I63442 Cerebral infarction due to embolism of left cerebellar artery: Secondary | ICD-10-CM | POA: Diagnosis present

## 2021-01-03 DIAGNOSIS — I639 Cerebral infarction, unspecified: Secondary | ICD-10-CM | POA: Diagnosis present

## 2021-01-03 DIAGNOSIS — F419 Anxiety disorder, unspecified: Secondary | ICD-10-CM | POA: Diagnosis present

## 2021-01-03 LAB — URINALYSIS, ROUTINE W REFLEX MICROSCOPIC
Bacteria, UA: NONE SEEN
Bilirubin Urine: NEGATIVE
Glucose, UA: NEGATIVE mg/dL
Hgb urine dipstick: NEGATIVE
Ketones, ur: 5 mg/dL — AB
Leukocytes,Ua: NEGATIVE
Nitrite: NEGATIVE
Protein, ur: 30 mg/dL — AB
Specific Gravity, Urine: 1.039 — ABNORMAL HIGH (ref 1.005–1.030)
pH: 5 (ref 5.0–8.0)

## 2021-01-03 LAB — RESP PANEL BY RT-PCR (FLU A&B, COVID) ARPGX2
Influenza A by PCR: NEGATIVE
Influenza B by PCR: NEGATIVE
SARS Coronavirus 2 by RT PCR: POSITIVE — AB

## 2021-01-03 LAB — D-DIMER, QUANTITATIVE: D-Dimer, Quant: 0.38 ug/mL-FEU (ref 0.00–0.50)

## 2021-01-03 LAB — C-REACTIVE PROTEIN: CRP: 1.2 mg/dL — ABNORMAL HIGH (ref <1.0)

## 2021-01-03 MED ORDER — PROCHLORPERAZINE EDISYLATE 10 MG/2ML IJ SOLN
10.0000 mg | Freq: Once | INTRAMUSCULAR | Status: AC
  Administered 2021-01-03: 10 mg via INTRAVENOUS
  Filled 2021-01-03: qty 2

## 2021-01-03 MED ORDER — AMLODIPINE BESYLATE 10 MG PO TABS
10.0000 mg | ORAL_TABLET | Freq: Every day | ORAL | Status: DC
  Administered 2021-01-03 – 2021-01-04 (×2): 10 mg via ORAL
  Filled 2021-01-03: qty 1
  Filled 2021-01-03: qty 2

## 2021-01-03 MED ORDER — ACETAMINOPHEN 160 MG/5ML PO SOLN: 650.0000 mg | ORAL | Status: DC | PRN

## 2021-01-03 MED ORDER — CLOPIDOGREL BISULFATE 75 MG PO TABS
75.0000 mg | ORAL_TABLET | Freq: Every day | ORAL | Status: DC
  Administered 2021-01-03 – 2021-01-05 (×3): 75 mg via ORAL
  Filled 2021-01-03 (×2): qty 1

## 2021-01-03 MED ORDER — ACETAMINOPHEN 650 MG RE SUPP: 650.0000 mg | RECTAL | Status: DC | PRN

## 2021-01-03 MED ORDER — SODIUM CHLORIDE 0.9 % IV BOLUS
1000.0000 mL | Freq: Once | INTRAVENOUS | Status: AC
  Administered 2021-01-03: 1000 mL via INTRAVENOUS

## 2021-01-03 MED ORDER — SODIUM CHLORIDE 0.9 % IV SOLN
100.0000 mg | Freq: Every day | INTRAVENOUS | Status: AC
  Administered 2021-01-04 – 2021-01-05 (×2): 100 mg via INTRAVENOUS
  Filled 2021-01-03 (×2): qty 20

## 2021-01-03 MED ORDER — ENOXAPARIN SODIUM 40 MG/0.4ML IJ SOSY
40.0000 mg | PREFILLED_SYRINGE | INTRAMUSCULAR | Status: DC
  Administered 2021-01-03 – 2021-01-05 (×3): 40 mg via SUBCUTANEOUS
  Filled 2021-01-03 (×3): qty 0.4

## 2021-01-03 MED ORDER — ALBUTEROL SULFATE HFA 108 (90 BASE) MCG/ACT IN AERS
1.0000 | INHALATION_SPRAY | Freq: Four times a day (QID) | RESPIRATORY_TRACT | Status: DC | PRN
  Filled 2021-01-03: qty 6.7

## 2021-01-03 MED ORDER — AMLODIPINE BESYLATE 5 MG PO TABS: 10.0000 mg | ORAL_TABLET | Freq: Every day | ORAL | Status: DC

## 2021-01-03 MED ORDER — ESCITALOPRAM OXALATE 10 MG PO TABS
10.0000 mg | ORAL_TABLET | Freq: Every day | ORAL | Status: DC
  Administered 2021-01-04 – 2021-01-05 (×2): 10 mg via ORAL
  Filled 2021-01-03 (×2): qty 1

## 2021-01-03 MED ORDER — ACETAMINOPHEN 325 MG PO TABS
650.0000 mg | ORAL_TABLET | ORAL | Status: DC | PRN
  Administered 2021-01-03 – 2021-01-05 (×4): 650 mg via ORAL
  Filled 2021-01-03 (×4): qty 2

## 2021-01-03 MED ORDER — SODIUM CHLORIDE 0.9% FLUSH
3.0000 mL | Freq: Two times a day (BID) | INTRAVENOUS | Status: DC
  Administered 2021-01-03 – 2021-01-05 (×5): 3 mL via INTRAVENOUS

## 2021-01-03 MED ORDER — ABACAVIR-DOLUTEGRAVIR-LAMIVUD 600-50-300 MG PO TABS
1.0000 | ORAL_TABLET | Freq: Every day | ORAL | Status: DC
  Administered 2021-01-04 – 2021-01-05 (×2): 1 via ORAL
  Filled 2021-01-03 (×3): qty 1

## 2021-01-03 MED ORDER — STROKE: EARLY STAGES OF RECOVERY BOOK
Freq: Once | Status: DC
  Filled 2021-01-03: qty 1

## 2021-01-03 MED ORDER — TRAZODONE HCL 50 MG PO TABS
25.0000 mg | ORAL_TABLET | Freq: Every day | ORAL | Status: DC
  Administered 2021-01-03 – 2021-01-04 (×2): 25 mg via ORAL
  Filled 2021-01-03 (×2): qty 1

## 2021-01-03 MED ORDER — GADOBUTROL 1 MMOL/ML IV SOLN
10.0000 mL | Freq: Once | INTRAVENOUS | Status: AC | PRN
  Administered 2021-01-03: 10 mL via INTRAVENOUS

## 2021-01-03 MED ORDER — CLOPIDOGREL BISULFATE 75 MG PO TABS: 75.0000 mg | ORAL_TABLET | Freq: Every day | ORAL | Status: DC

## 2021-01-03 MED ORDER — SODIUM CHLORIDE 0.9 % IV SOLN
200.0000 mg | Freq: Once | INTRAVENOUS | Status: AC
  Administered 2021-01-03: 200 mg via INTRAVENOUS
  Filled 2021-01-03: qty 40

## 2021-01-03 MED ORDER — POTASSIUM CHLORIDE CRYS ER 20 MEQ PO TBCR
20.0000 meq | EXTENDED_RELEASE_TABLET | Freq: Once | ORAL | Status: AC
  Administered 2021-01-03: 20 meq via ORAL
  Filled 2021-01-03: qty 1

## 2021-01-03 MED ORDER — ATORVASTATIN CALCIUM 40 MG PO TABS: 40.0000 mg | ORAL_TABLET | Freq: Every day | ORAL | Status: DC

## 2021-01-03 MED ORDER — SODIUM CHLORIDE 0.9 % IV SOLN: INTRAVENOUS | Status: AC

## 2021-01-03 NOTE — ED Notes (Signed)
PT NOT IN LOBBY FOR VITALS UPDATE

## 2021-01-03 NOTE — Progress Notes (Signed)
PT Note  Patient Details Name: Man Effertz MRN: [id number] DOB: Mar 21, 1971   See pt is in ED and orders for admission. Will hold PT assessment until pt arrives on unit. If you need [place] to see prior to admission, please call our main office [phone] to inform us. Thanks         Clide Dales 01/03/2021, 10:52 AM  Gatha Mayer, PT, MPT Acute Rehabilitation Services Office: [phone] Pager: [phone] 01/03/2021

## 2021-01-03 NOTE — ED Notes (Signed)
Patient transported to CT, then to MRI.

## 2021-01-03 NOTE — ED Provider Notes (Signed)
[facility] DEPT Provider Note  CSN: [id number] Arrival date & time: 01/02/21 3267  Chief Complaint(s) Nausea and Headache  HPI Drew Cuevas is a 50 y.o. male with a past medical history listed below including HIV that is well controlled who presents to the emergency department with 3 days of headache, dizziness, nausea and nonbloody nonbilious emesis, and difficulty ambulating.  Sudden onset.  Persistent since onset.  Headache worse when eyes are open.  No chest pain or shortness of breath.  Patient believes he was exposed to COVID-19 and has had runny nose and congestion.  No fevers or chills..  No myalgias.  No other physical complaints.  HPI  Past Medical History Past Medical History:  Diagnosis Date  . Bronchitis    LAST FLARE UP WAS NEW YEARS 2015  . Cancer ([place])    kidney  . GERD (gastroesophageal reflux disease)    NO MEDS  . HIV (human immunodeficiency virus infection) ([place])    UNDER CONTROL WITH MEDICATIONS  . Hypertension   . Immune deficiency disorder ([place])   . Renal insufficiency 05/17/2015  . Renal mass, right    Patient Active Problem List   Diagnosis Date Noted  . Diverticulitis of large intestine with abscess 09/27/2020  . Depression 06/19/2019  . Other insomnia 06/19/2019  . Chronic kidney disease, stage 3a ([place]) 04/15/2018  . Tobacco dependence 01/13/2018  . Primary osteoarthritis of right knee 04/03/2017  . Dyslipidemia 06/14/2016  . Renal cell carcinoma of right kidney ([place]) 08/19/2013  . Renal cyst 05/21/2013  . Obesity (BMI 30-39.9) 05/26/2012  . Internal hemorrhoids 12/01/2008  . Hypertension 04/28/2008  . Major depressive disorder, single episode, severe ([place]) 12/20/2006  . Human immunodeficiency virus (HIV) disease ([place]) 08/28/2006   Home Medication(s) Prior to Admission medications   Medication Sig Start Date End Date Taking? Authorizing Provider  acetaminophen (TYLENOL) 325 MG tablet Take 2 tablets (650 mg  total) by mouth every 6 (six) hours as needed for mild pain (or Fever >/= 101). 10/01/20   Eugenie Filler, MD  albuterol (VENTOLIN HFA) 108 (90 Base) MCG/ACT inhaler Inhale 1-2 puffs into the lungs every 6 (six) hours as needed for wheezing or shortness of breath.    [provider]  amLODipine (NORVASC) 10 MG tablet Take 1 tablet (10 mg total) by mouth daily. 09/01/20   Ladell Pier, MD  escitalopram (LEXAPRO) 10 MG tablet TAKE 1 TABLET(10 MG) BY MOUTH DAILY Patient taking differently: Take 10 mg by mouth daily. 10/02/19   Ladell Pier, MD  lovastatin (MEVACOR) 40 MG tablet Take 1 tablet (40 mg total) by mouth at bedtime. 09/01/20   Ladell Pier, MD  traZODone (DESYREL) 50 MG tablet TAKE 1/2 TABLET(25 MG) BY MOUTH AT BEDTIME 12/24/20   Ladell Pier, MD  TRIUMEQ 600-50-300 MG tablet TAKE 1 TABLET BY MOUTH ONCE DAILY Patient taking differently: Take 1 tablet by mouth daily. 07/01/20   Michel Bickers, MD  Past Surgical History Past Surgical History:  Procedure Laterality Date  . NO PAST SURGERIES    . ROBOT ASSISTED LAPAROSCOPIC NEPHRECTOMY Right 08/19/2013   Procedure: ROBOTIC ASSISTED LAPAROSCOPIC RIGHT PARTIAL NEPHRECTOMY;  Surgeon: Ardis Hughs, MD;  Location: [facility];  Service: Urology;  Laterality: Right;   Family History Family History  Problem Relation Age of Onset  . Hypertension Mother   . Hypertension Sister   . Hypertension Brother     Social History Social History   Tobacco Use  . Smoking status: Current Every Day Smoker    Packs/day: 0.50    Years: 30.00    Pack years: 15.00    Types: Cigarettes    Last attempt to quit: 11/28/2015    Years since quitting: 5.1  . Smokeless tobacco: Never Used  . Tobacco comment: stopped May 2017  Vaping Use  . Vaping Use: Never used  Substance Use Topics  . Alcohol use: No     Comment: stopped 10-12 years ago  . Drug use: No   Allergies Ibuprofen, Penicillins, and Latex  Review of Systems Review of Systems All other systems are reviewed and are negative for acute change except as noted in the HPI  Physical Exam Vital Signs  I have reviewed the triage vital signs BP (!) 163/96 (BP Location: Right Arm)   Pulse 88   Temp 98 F (36.7 C) (Oral)   Resp 19   Ht 5\' 6"  (1.676 m)   Wt 105.7 kg   SpO2 98%   BMI 37.61 kg/m   Physical Exam Constitutional:      General: He is not in acute distress.    Appearance: He is well-developed. He is not diaphoretic.  HENT:     Head: Normocephalic.     Right Ear: External ear normal.     Left Ear: External ear normal.  Eyes:     General: No scleral icterus.       Right eye: No discharge.        Left eye: No discharge.     Conjunctiva/sclera: Conjunctivae normal.     Pupils: Pupils are equal, round, and reactive to light.  Cardiovascular:     Rate and Rhythm: Regular rhythm.     Pulses:          Radial pulses are 2+ on the right side and 2+ on the left side.       Dorsalis pedis pulses are 2+ on the right side and 2+ on the left side.     Heart sounds: Normal heart sounds. No murmur heard. No friction rub. No gallop.   Pulmonary:     Effort: Pulmonary effort is normal. No respiratory distress.     Breath sounds: Normal breath sounds. No stridor.  Abdominal:     General: There is no distension.     Palpations: Abdomen is soft.     Tenderness: There is no abdominal tenderness.  Musculoskeletal:     Cervical back: Normal range of motion and neck supple. No bony tenderness.     Thoracic back: No bony tenderness.     Lumbar back: No bony tenderness.     Comments: Clavicle stable. Chest stable to AP/Lat compression. Pelvis stable to Lat compression. No obvious extremity deformity. No chest or abdominal wall contusion.  Skin:    General: Skin is warm.  Neurological:     Mental Status: He is alert and  oriented to person, place, and time.     GCS: GCS eye  subscore is 4. GCS verbal subscore is 5. GCS motor subscore is 6.     Comments: Mental Status:  Alert and oriented to person, place, and time.  Attention and concentration normal.  Speech clear.  Recent memory is intact  Cranial Nerves:  II Visual Fields: Intact to confrontation. Visual fields intact. III, IV, VI: Pupils equal and reactive to light and near. Full eye movement with isolated left eye vertical nystagmus  V Facial Sensation: Normal. No weakness of masticatory muscles  VII: No facial weakness or asymmetry  VIII Auditory Acuity: Grossly normal  IX/X: The uvula is midline; the palate elevates symmetrically  XI: Normal sternocleidomastoid and trapezius strength  XII: The tongue is midline. No atrophy or fasciculations.   Motor System: Muscle Strength: 5/5 and symmetric in the upper and lower extremities. No pronation or drift.  Muscle Tone: Tone and muscle bulk are normal in the upper and lower extremities.   Coordination: dysmetric FtN on left.  No tremor.  Sensation: Intact to light touch, and pinprick.  Gait: deferred       ED Results and Treatments Labs (all labs ordered are listed, but only abnormal results are displayed) Labs Reviewed  COMPREHENSIVE METABOLIC PANEL - Abnormal; Notable for the following components:      Result Value   Potassium 3.3 (*)    All other components within normal limits  RESP PANEL BY RT-PCR (FLU A&B, COVID) ARPGX2  LIPASE, BLOOD  CBC  URINALYSIS, ROUTINE W REFLEX MICROSCOPIC                                                                                                                         EKG  EKG Interpretation  Date/Time:  Tuesday January 03 2021 04:11:51 EDT Ventricular Rate:  94 PR Interval:  142 QRS Duration: 105 QT Interval:  369 QTC Calculation: 462 R Axis:   -51 Text Interpretation: Sinus rhythm Left anterior fascicular block Abnormal R-wave progression, late  transition 12 Lead; [place]-L[place]ar No significant change since last tracing Confirmed by Addison Lank [phone]) on 01/03/2021 4:42:49 AM      Radiology CT Head Wo Contrast  Result Date: 01/03/2021 CLINICAL DATA:  Ataxia, nontraumatic. Stroke excluded. Headache, unable to wall, 4 days. Hypertensive medication. Right kidney cancer. EXAM: CT HEAD WITHOUT CONTRAST TECHNIQUE: Contiguous axial images were obtained from the base of the skull through the vertex without intravenous contrast. COMPARISON:  CT head 10/01/2014 FINDINGS: Brain: Gray-white matter differentiation of the left cerebellum with associated edema. No parenchymal hemorrhage. No mass lesion. No extra-axial collection. No midline shift. No hydrocephalus. Basilar cisterns are patent. Vascular: No hyperdense vessel. Skull: No acute fracture or focal lesion. Sinuses/Orbits: Paranasal sinuses and mastoid air cells are clear. The orbits are unremarkable. Other: None. IMPRESSION: 1. Subacute left cerebellar infarction. 2. No acute intracranial hemorrhage. These results were called by telephone at the time of interpretation on 01/03/2021 at 6:44 am to provider [facility] , who verbally acknowledged these results. Electronically Signed  By: Iven Finn M.D.   On: 01/03/2021 06:43    Pertinent labs & imaging results that were available during my care of the patient were reviewed by me and considered in my medical decision making (see chart for details).  Medications Ordered in ED Medications  gadobutrol (GADAVIST) 1 MMOL/ML injection 10 mL (has no administration in time range)  prochlorperazine (COMPAZINE) injection 10 mg (10 mg Intravenous Given 01/03/21 0609)  sodium chloride 0.9 % bolus 1,000 mL (1,000 mLs Intravenous New Bag/Given 01/03/21 0609)                                                                                                                                    Procedures Procedures  (including critical care time)  Medical  Decision Making / ED Course I have reviewed the nursing notes for this encounter and the patient's prior records (if available in EHR or on provided paperwork).   Crews Mccollam was evaluated in Emergency Department on 01/03/2021 for the symptoms described in the history of present illness. He was evaluated in the context of the global COVID-19 pandemic, which necessitated consideration that the patient might be at risk for infection with the SARS-CoV-2 virus that causes COVID-19. Institutional protocols and algorithms that pertain to the evaluation of patients at risk for COVID-19 are in a state of rapid change based on information released by regulatory bodies including the CDC and federal and state organizations. These policies and algorithms were followed during the patient's care in the ED.  Patient has isolated left eye nystagmus with dysmetria finger-nose on the left concerning for subacute stroke given the timeline. CT head confirmed left cerebellar stroke without bleed. Screening labs reassuring. Spoke with neurology who agree with obtaining MRIs and admission for further management and work-up.      Final Clinical Impression(s) / ED Diagnoses Final diagnoses:  Cerebellar stroke [facility])      This chart was dictated using voice recognition software.  Despite best efforts to proofread,  errors can occur which can change the documentation meaning.   Fatima Blank, MD 01/03/21 [phone]

## 2021-01-03 NOTE — H&P (Signed)
History and Physical        Hospital Admission Note Date: 01/03/2021  Patient name: Drew Cuevas Medical record number: [id number] Date of birth: 30-May-1971 Age: 50 y.o. Gender: male  PCP: Ladell Pier, MD   Chief Complaint    Chief Complaint  Patient presents with  . Nausea  . Headache      HPI:   This is a 50 year old male who has been vaccinated against COVID-19 x3 with past medical history of well-controlled HIV, hypertension, hyperlipidemia, tobacco use, CKD 3 a, right kidney RCC, who presented to the ED on 6/6 with 3-4 days of headache, dizziness, nausea, nonbloody vomiting and difficulty ambulating which was sudden onset.  This has persisted over the past several days.  Headache noted to be worse when his eyes are open and admits to photophobia.  Denies any chest pain, shortness of breath, fever, myalgias no other complaints.  Believes he may have been exposed to COVID-19 2 weeks ago as well.  No personal history of stroke but has a family history of stroke in his grandmother.  Otherwise no complaints   ED Course: Afebrile, hemodynamically stable, on room air.  Labs from 6/6: Sodium 137, K3.3, COVID-19 flu pending, otherwise unremarkable. Notable Imaging: CT head without contrast-subacute left cerebellar infarction.  Neurology was contacted who recommended MRI brain, MRA head and neck which are pending. Patient received 1 L NS bolus, Compazine.    Vitals:   01/03/21 0441 01/03/21 0610  BP: (!) 184/115 (!) 163/96  Pulse: 87 88  Resp: (!) 21 19  Temp:  98 F (36.7 C)  SpO2: 98% 98%     Review of Systems:  Review of Systems  All other systems reviewed and are negative.   Medical/Social/Family History   Past Medical History: Past Medical History:  Diagnosis Date  . Bronchitis    LAST FLARE UP WAS NEW YEARS 2015  . Cancer ([place])    kidney  . GERD  (gastroesophageal reflux disease)    NO MEDS  . HIV (human immunodeficiency virus infection) ([place])    UNDER CONTROL WITH MEDICATIONS  . Hypertension   . Immune deficiency disorder ([place])   . Renal insufficiency 05/17/2015  . Renal mass, right     Past Surgical History:  Procedure Laterality Date  . NO PAST SURGERIES    . ROBOT ASSISTED LAPAROSCOPIC NEPHRECTOMY Right 08/19/2013   Procedure: ROBOTIC ASSISTED LAPAROSCOPIC RIGHT PARTIAL NEPHRECTOMY;  Surgeon: Ardis Hughs, MD;  Location: [facility];  Service: Urology;  Laterality: Right;    Medications: Prior to Admission medications   Medication Sig Start Date End Date Taking? Authorizing Provider  acetaminophen (TYLENOL) 325 MG tablet Take 2 tablets (650 mg total) by mouth every 6 (six) hours as needed for mild pain (or Fever >/= 101). 10/01/20   Eugenie Filler, MD  albuterol (VENTOLIN HFA) 108 (90 Base) MCG/ACT inhaler Inhale 1-2 puffs into the lungs every 6 (six) hours as needed for wheezing or shortness of breath.    [provider]  amLODipine (NORVASC) 10 MG tablet Take 1 tablet (10 mg total) by mouth daily. 09/01/20   Ladell Pier, MD  escitalopram (LEXAPRO) 10 MG tablet TAKE 1 TABLET(10 MG) BY  MOUTH DAILY Patient taking differently: Take 10 mg by mouth daily. 10/02/19   Ladell Pier, MD  lovastatin (MEVACOR) 40 MG tablet Take 1 tablet (40 mg total) by mouth at bedtime. 09/01/20   Ladell Pier, MD  traZODone (DESYREL) 50 MG tablet TAKE 1/2 TABLET(25 MG) BY MOUTH AT BEDTIME 12/24/20   Ladell Pier, MD  TRIUMEQ 600-50-300 MG tablet TAKE 1 TABLET BY MOUTH ONCE DAILY Patient taking differently: Take 1 tablet by mouth daily. 07/01/20   Michel Bickers, MD    Allergies:   Allergies  Allergen Reactions  . Ibuprofen Anaphylaxis, Swelling and Other (See Comments)    Dehydration Swelling of the "moist areas" (throat, mouth)  . Penicillins Hives    Has patient had a PCN reaction causing immediate rash,  facial/tongue/throat swelling, SOB or lightheadedness with hypotension: yes Has patient had a PCN reaction causing severe rash involving mucus membranes or skin necrosis: no-- pt had hives Has patient had a PCN reaction that required hospitalization no Has patient had a PCN reaction occurring within the last 10 years: no If all of the above answers are "NO", then may proceed with Cephalosporin use.   . Latex Rash    Social History:  reports that he has been smoking cigarettes. He has a 15.00 pack-year smoking history. He has never used smokeless tobacco. He reports that he does not drink alcohol and does not use drugs.  Family History: Family History  Problem Relation Age of Onset  . Hypertension Mother   . Hypertension Sister   . Hypertension Brother      Objective   Physical Exam: Blood pressure (!) 163/96, pulse 88, temperature 98 F (36.7 C), temperature source Oral, resp. rate 19, height 5\' 6"  (1.676 m), weight 105.7 kg, SpO2 98 %.  Physical Exam Vitals and nursing note reviewed.  Constitutional:      Appearance: Normal appearance.  HENT:     Head: Normocephalic and atraumatic.  Eyes:     Conjunctiva/sclera: Conjunctivae normal.  Cardiovascular:     Rate and Rhythm: Normal rate and regular rhythm.  Pulmonary:     Effort: Pulmonary effort is normal.     Breath sounds: Normal breath sounds.  Abdominal:     General: Abdomen is flat.     Palpations: Abdomen is soft.  Musculoskeletal:        General: No swelling or tenderness.  Skin:    Coloration: Skin is not jaundiced or pale.  Neurological:     Mental Status: He is alert.     Cranial Nerves: Cranial nerves are intact. No dysarthria or facial asymmetry.     Coordination: Impaired rapid alternating movements.     Comments: Positive left upper extremity dysmetria, negative on right Negative bilateral heel-to-shin test   Psychiatric:        Mood and Affect: Mood normal.        Behavior: Behavior normal.      LABS on Admission: I have personally reviewed all the labs and imaging below    Basic Metabolic Panel: Recent Labs  Lab 01/02/21 2024  NA 137  K 3.3*  CL 105  CO2 25  GLUCOSE 94  BUN 11  CREATININE 1.23  CALCIUM 9.1   Liver Function Tests: Recent Labs  Lab 01/02/21 2024  AST 20  ALT 29  ALKPHOS 82  BILITOT 0.4  PROT 8.1  ALBUMIN 4.2   Recent Labs  Lab 01/02/21 2024  LIPASE 46   No results for input(s): AMMONIA  in the last 168 hours. CBC: Recent Labs  Lab 01/02/21 2024  WBC 7.4  HGB 16.0  HCT 46.4  MCV 94.9  PLT 191   Cardiac Enzymes: No results for input(s): CKTOTAL, CKMB, CKMBINDEX, TROPONINI in the last 168 hours. BNP: Invalid input(s): POCBNP CBG: No results for input(s): GLUCAP in the last 168 hours.  Radiological Exams on Admission:  CT Head Wo Contrast  Result Date: 01/03/2021 CLINICAL DATA:  Ataxia, nontraumatic. Stroke excluded. Headache, unable to wall, 4 days. Hypertensive medication. Right kidney cancer. EXAM: CT HEAD WITHOUT CONTRAST TECHNIQUE: Contiguous axial images were obtained from the base of the skull through the vertex without intravenous contrast. COMPARISON:  CT head 10/01/2014 FINDINGS: Brain: Gray-white matter differentiation of the left cerebellum with associated edema. No parenchymal hemorrhage. No mass lesion. No extra-axial collection. No midline shift. No hydrocephalus. Basilar cisterns are patent. Vascular: No hyperdense vessel. Skull: No acute fracture or focal lesion. Sinuses/Orbits: Paranasal sinuses and mastoid air cells are clear. The orbits are unremarkable. Other: None. IMPRESSION: 1. Subacute left cerebellar infarction. 2. No acute intracranial hemorrhage. These results were called by telephone at the time of interpretation on 01/03/2021 at 6:44 am to provider [facility] , who verbally acknowledged these results. Electronically Signed   By: Iven Finn M.D.   On: 01/03/2021 06:43      EKG: unchanged from  previous tracings, normal sinus rhythm, left axis deviation, LAFB   A & P   Principal Problem:   Cerebellar stroke (HCC) Active Problems:   Human immunodeficiency virus (HIV) disease (HCC)   Hypertension   Dyslipidemia   Tobacco dependence   Chronic kidney disease, stage 3a ([place])   Depression   COVID-19 virus infection   1. Subacute left cerebellar infarction a. Symptom onset 3 to 4 days ago b. Photophobia, positive LUE dysmetria and positive rapid alternating movements on exam c. MRI/MRA pending d. Echo e. Change lovastatin to atorvastatin f. HbA1c g. Lipid panel h. Risk factor modification i. Hx of anaphylaxis with NSAIDs, will hold Aspirin load and give Plavix 75 mg after discussion with Neuro NP j. PT/OT/SLP eval k. Neurology consulted.  Transfer to [facility] for further neurology work-up  2. Incidental COVID-19 a. Exposed 2 weeks ago.  Currently without any respiratory symptoms b. Incidental remdesivir treatment c. Check inflammatory markers d. Incentive spirometry e. COVID precautions  3. Tobacco use a. Advised cessation  4. HIV a. Check CD4 b. Continue Triumeq  5. Hypertension a. Continue home amlodipine  6. Hyperlipidemia a. As above  7. Anxiety/depression a. Continue home Lexapro and trazodone  8. Hypokalemia a. Replete p.o.  9. CKD 3a, at baseline    DVT prophylaxis: Lovenox   Code Status: Full Code  Diet: Pending SLP eval Family Communication: Admission, patients condition and plan of care including tests being ordered have been discussed with the patient who indicates understanding and agrees with the plan and Code Status.  Disposition Plan: The appropriate patient status for this patient is INPATIENT. Inpatient status is judged to be reasonable and necessary in order to provide the required intensity of service to ensure the patient's safety. The patient's presenting symptoms, physical exam findings, and initial radiographic and laboratory data in  the context of their chronic comorbidities is felt to place them at high risk for further clinical deterioration. Furthermore, it is not anticipated that the patient will be medically stable for discharge from the hospital within 2 midnights of admission. The following factors support the patient status of inpatient.   "  The patient's presenting symptoms include nausea, vomiting, ambulatory dysfunction. " The worrisome physical exam findings include photophobia, LUE dysmetria and positive RAM. " The initial radiographic and laboratory data are worrisome because of left cerebellar subacute stroke. " The chronic co-morbidities include hypertension, HIV.   * I certify that at the point of admission it is my clinical judgment that the patient will require inpatient hospital care spanning beyond 2 midnights from the point of admission due to high intensity of service, high risk for further deterioration and high frequency of surveillance required.*     Consultants  . Neurology  Procedures  . None  Time Spent on Admission: 63 minutes    Harold Hedge, DO [facility]  01/03/2021, 9:33 AM

## 2021-01-03 NOTE — ED Notes (Signed)
Pt is very unstable and will fall over while seated.

## 2021-01-04 ENCOUNTER — Inpatient Hospital Stay (HOSPITAL_COMMUNITY): Payer: 59

## 2021-01-04 DIAGNOSIS — I6389 Other cerebral infarction: Secondary | ICD-10-CM

## 2021-01-04 DIAGNOSIS — I639 Cerebral infarction, unspecified: Secondary | ICD-10-CM

## 2021-01-04 LAB — RAPID URINE DRUG SCREEN, HOSP PERFORMED
Amphetamines: NOT DETECTED
Barbiturates: NOT DETECTED
Benzodiazepines: NOT DETECTED
Cocaine: NOT DETECTED
Opiates: POSITIVE — AB
Tetrahydrocannabinol: POSITIVE — AB

## 2021-01-04 LAB — CBC WITH DIFFERENTIAL/PLATELET
Abs Immature Granulocytes: 0.01 10*3/uL (ref 0.00–0.07)
Basophils Absolute: 0 10*3/uL (ref 0.0–0.1)
Basophils Relative: 0 %
Eosinophils Absolute: 0.1 10*3/uL (ref 0.0–0.5)
Eosinophils Relative: 1 %
HCT: 46.5 % (ref 39.0–52.0)
Hemoglobin: 16.1 g/dL (ref 13.0–17.0)
Immature Granulocytes: 0 %
Lymphocytes Relative: 40 %
Lymphs Abs: 2.9 10*3/uL (ref 0.7–4.0)
MCH: 32.8 pg (ref 26.0–34.0)
MCHC: 34.6 g/dL (ref 30.0–36.0)
MCV: 94.7 fL (ref 80.0–100.0)
Monocytes Absolute: 0.5 10*3/uL (ref 0.1–1.0)
Monocytes Relative: 7 %
Neutro Abs: 3.7 10*3/uL (ref 1.7–7.7)
Neutrophils Relative %: 52 %
Platelets: 165 10*3/uL (ref 150–400)
RBC: 4.91 MIL/uL (ref 4.22–5.81)
RDW: 13.3 % (ref 11.5–15.5)
WBC: 7.3 10*3/uL (ref 4.0–10.5)
nRBC: 0 % (ref 0.0–0.2)

## 2021-01-04 LAB — LIPID PANEL
Cholesterol: 198 mg/dL (ref 0–200)
HDL: 29 mg/dL — ABNORMAL LOW (ref >40)
LDL Cholesterol: 134 mg/dL — ABNORMAL HIGH (ref 0–99)
Total CHOL/HDL Ratio: 6.8 RATIO
Triglycerides: 174 mg/dL — ABNORMAL HIGH (ref <150)
VLDL: 35 mg/dL (ref 0–40)

## 2021-01-04 LAB — COMPREHENSIVE METABOLIC PANEL
ALT: 22 U/L (ref 0–44)
AST: 18 U/L (ref 15–41)
Albumin: 3.6 g/dL (ref 3.5–5.0)
Alkaline Phosphatase: 76 U/L (ref 38–126)
Anion gap: 10 (ref 5–15)
BUN: 16 mg/dL (ref 6–20)
CO2: 23 mmol/L (ref 22–32)
Calcium: 9.2 mg/dL (ref 8.9–10.3)
Chloride: 103 mmol/L (ref 98–111)
Creatinine, Ser: 1.3 mg/dL — ABNORMAL HIGH (ref 0.61–1.24)
GFR, Estimated: >60 mL/min (ref >60)
Glucose, Bld: 93 mg/dL (ref 70–99)
Potassium: 3.4 mmol/L — ABNORMAL LOW (ref 3.5–5.1)
Sodium: 136 mmol/L (ref 135–145)
Total Bilirubin: 0.7 mg/dL (ref 0.3–1.2)
Total Protein: 6.9 g/dL (ref 6.5–8.1)

## 2021-01-04 LAB — FERRITIN: Ferritin: 274 ng/mL (ref 24–336)

## 2021-01-04 LAB — C-REACTIVE PROTEIN: CRP: 1.3 mg/dL — ABNORMAL HIGH (ref <1.0)

## 2021-01-04 LAB — CD4/CD8 (T-HELPER/T-SUPPRESSOR CELL)
CD4 absolute: 954 /uL (ref 400–1790)
CD4%: 34.11 % (ref 33–65)
CD8 T Cell Abs: 1414 /uL — ABNORMAL HIGH (ref 190–1000)
CD8tox: 50.54 % — ABNORMAL HIGH (ref 12–40)
Ratio: 0.67 — ABNORMAL LOW (ref 1.0–3.0)
Total lymphocyte count: 2797 /uL (ref 1000–4000)

## 2021-01-04 LAB — D-DIMER, QUANTITATIVE: D-Dimer, Quant: 0.77 ug/mL-FEU — ABNORMAL HIGH (ref 0.00–0.50)

## 2021-01-04 MED ORDER — ATORVASTATIN CALCIUM 80 MG PO TABS
80.0000 mg | ORAL_TABLET | Freq: Every day | ORAL | Status: DC
  Administered 2021-01-04 – 2021-01-05 (×2): 80 mg via ORAL
  Filled 2021-01-04 (×2): qty 1

## 2021-01-04 MED ORDER — POTASSIUM CHLORIDE CRYS ER 20 MEQ PO TBCR
40.0000 meq | EXTENDED_RELEASE_TABLET | Freq: Once | ORAL | Status: AC
  Administered 2021-01-04: 40 meq via ORAL
  Filled 2021-01-04: qty 2

## 2021-01-04 MED ORDER — ONDANSETRON HCL 4 MG/2ML IJ SOLN
4.0000 mg | Freq: Four times a day (QID) | INTRAMUSCULAR | Status: DC | PRN
  Administered 2021-01-04 – 2021-01-05 (×2): 4 mg via INTRAVENOUS
  Filled 2021-01-04 (×2): qty 2

## 2021-01-04 MED ORDER — HYDROCODONE-ACETAMINOPHEN 5-325 MG PO TABS
1.0000 | ORAL_TABLET | Freq: Once | ORAL | Status: AC
  Administered 2021-01-04: 1 via ORAL
  Filled 2021-01-04: qty 1

## 2021-01-04 NOTE — Consult Note (Signed)
NEUROLOGY CONSULTATION NOTE   Date of service: January 04, 2021 Patient Name: Drew Cuevas MRN:  [id number] DOB:  17-Jul-1971 Reason for consult: "Stroke" Requesting Provider: Harold Hedge, MD _ _ _   _ __   _ __ _ _  __ __   _ __   __ _  History of Present Illness  Drew Cuevas is a 50 y.o. male with PMH significant for history of bronchitis, cancer, GERD, HIV, HTN, renal mass and renal insufficiency who presents with 3-4 days of vertigo, nasuea, vomiting and difficult ambulating.   Patient reports that he went to bed at 1500 on 01/01/2021 after he got out of work.  He woke up at 1700 on 01/01/2021 with vertigo, nausea, vomiting, difficulty standing up and coordinating his left hand and his left leg.  He waited but his symptoms did not improve so he eventually came into the emergency department.  Workup with MRI Brain demonstrated an acute left cerebellar stroke and small acute brainstem stroke along with a small subacute R frontal cortical infarct. MR Anrio head and neck with no LVO.  Endorses smoking 5 to 6 cigarettes a day, uses marijuana.  Does not take aspirin at home.  No prior personal history of strokes.  Does endorse family history of stroke in his grandma.  MRS: 0 TPA/Thrombectomy: outside window at presentation. LKW: 1500 on 01/01/21   NIHSS components Score: Comment  1a Level of Conscious 0[x]  1[]  2[]  3[]      1b LOC Questions 0[x]  1[]  2[]       1c LOC Commands 0[x]  1[]  2[]       2 Best Gaze 0[x]  1[]  2[]       3 Visual 0[x]  1[]  2[]  3[]      4 Facial Palsy 0[x]  1[]  2[]  3[]      5a Motor Arm - left 0[x]  1[]  2[]  3[]  4[]  UN[]    5b Motor Arm - Right 0[x]  1[]  2[]  3[]  4[]  UN[]    6a Motor Leg - Left 0[x]  1[]  2[]  3[]  4[]  UN[]    6b Motor Leg - Right 0[x]  1[]  2[]  3[]  4[]  UN[]    7 Limb Ataxia 0[]  1[]  2[x]  3[]  UN[]     8 Sensory 0[x]  1[]  2[]  UN[]      9 Best Language 0[x]  1[]  2[]  3[]      10 Dysarthria 0[x]  1[]  2[]  UN[]      11 Extinct. and Inattention 0[x]  1[]  2[]       TOTAL: 2       ROS    Constitutional Denies weight loss, fever and chills.   HEENT Denies changes in vision and hearing.   Respiratory Denies SOB and cough.   CV Denies palpitations and CP   GI Denies abdominal pain, nausea, vomiting and diarrhea.   GU Denies dysuria and urinary frequency.   MSK Denies myalgia and joint pain.   Skin Denies rash and pruritus.   Neurological Denies headache and syncope.   Psychiatric Denies recent changes in mood. Denies anxiety and depression.    Past History   Past Medical History:  Diagnosis Date  . Bronchitis    LAST FLARE UP WAS NEW YEARS 2015  . Cancer ([place])    kidney  . GERD (gastroesophageal reflux disease)    NO MEDS  . HIV (human immunodeficiency virus infection) ([place])    UNDER CONTROL WITH MEDICATIONS  . Hypertension   . Immune deficiency disorder ([place])   . Renal insufficiency 05/17/2015  . Renal mass, right    Past Surgical History:  Procedure Laterality Date  .  NO PAST SURGERIES    . ROBOT ASSISTED LAPAROSCOPIC NEPHRECTOMY Right 08/19/2013   Procedure: ROBOTIC ASSISTED LAPAROSCOPIC RIGHT PARTIAL NEPHRECTOMY;  Surgeon: Ardis Hughs, MD;  Location: [facility];  Service: Urology;  Laterality: Right;   Family History  Problem Relation Age of Onset  . Hypertension Mother   . Hypertension Sister   . Hypertension Brother    Social History   Socioeconomic History  . Marital status: Single    Spouse name: Not on file  . Number of children: Not on file  . Years of education: Not on file  . Highest education level: Not on file  Occupational History  . Not on file  Tobacco Use  . Smoking status: Current Every Day Smoker    Packs/day: 0.50    Years: 30.00    Pack years: 15.00    Types: Cigarettes    Last attempt to quit: 11/28/2015    Years since quitting: 5.1  . Smokeless tobacco: Never Used  . Tobacco comment: stopped May 2017  Vaping Use  . Vaping Use: Never used  Substance and Sexual Activity  . Alcohol use: No    Comment: stopped 10-12  years ago  . Drug use: No  . Sexual activity: Not Currently    Partners: Male    Comment: declined condoms  Other Topics Concern  . Not on file  Social History Narrative  . Not on file   Social Determinants of Health   Financial Resource Strain: Not on file  Food Insecurity: Not on file  Transportation Needs: Not on file  Physical Activity: Not on file  Stress: Not on file  Social Connections: Not on file   Allergies  Allergen Reactions  . Ibuprofen Anaphylaxis, Swelling and Other (See Comments)    Dehydration Swelling of the "moist areas" (throat, mouth)  . Penicillins Hives    Has patient had a PCN reaction causing immediate rash, facial/tongue/throat swelling, SOB or lightheadedness with hypotension: Has patient had a PCN reaction causing severe rash involving mucus membranes or skin necrosis: Has patient had a PCN reaction that required hospitalization Has patient had a PCN reaction occurring within the last 10 year If all of the above answers are "NO", then may proceed with Cephalosporin use.   . Latex Rash    Medications   Medications Prior to Admission  Medication Sig Dispense Refill Last Dose  . acetaminophen (TYLENOL) 325 MG tablet Take 2 tablets (650 mg total) by mouth every 6 (six) hours as needed for mild pain (or Fever >/= 101).   01/02/2021 at Unknown time  . albuterol (VENTOLIN HFA) 108 (90 Base) MCG/ACT inhaler Inhale 1-2 puffs into the lungs every 6 (six) hours as needed for wheezing or shortness of breath.   unk  . amLODipine (NORVASC) 10 MG tablet Take 1 tablet (10 mg total) by mouth daily. 30 tablet 6 12/31/2020  . escitalopram (LEXAPRO) 10 MG tablet TAKE 1 TABLET(10 MG) BY MOUTH DAILY (Patient taking differently: Take 10 mg by mouth daily.) 30 tablet 0 12/31/2020  . lovastatin (MEVACOR) 40 MG tablet Take 1 tablet (40 mg total) by mouth at bedtime. 90 tablet 1 12/31/2020  . traZODone (DESYREL) 50 MG tablet TAKE 1/2 TABLET(25 MG) BY MOUTH AT BEDTIME (Patient  taking differently: Take 25 mg by mouth at bedtime. TAKE 1/2 TABLET(25 MG) BY MOUTH AT BEDTIME) 15 tablet 0 12/31/2020  . TRIUMEQ 600-50-300 MG tablet TAKE 1 TABLET BY MOUTH ONCE DAILY (Patient taking differently: Take 1 tablet by mouth  daily.) 30 tablet 3 12/31/2020     Vitals   Vitals:   01/03/21 1800 01/03/21 1830 01/03/21 2347 01/04/21 0338  BP: (!) 149/98 (!) 152/104 131/90 (!) 139/101  Pulse: 77 77 98 94  Resp: 16 16 18 19   Temp:   98.2 F (36.8 C) 98.5 F (36.9 C)  TempSrc:   Oral Oral  SpO2: 94% 94% 97% 97%  Weight:      Height:         Body mass index is 37.61 kg/m.  Physical Exam   General: Laying comfortably in bed; in no acute distress.  HENT: Normal oropharynx and mucosa. Normal external appearance of ears and nose.  Neck: Supple, no pain or tenderness CV: No JVD. No peripheral edema.  Pulmonary: Symmetric Chest rise. Normal respiratory effort.  Abdomen: Soft to touch, non-tender.  Ext: No cyanosis, edema, or deformity  Skin: No rash. Normal palpation of skin.   Musculoskeletal: Normal digits and nails by inspection. No clubbing.   Neurologic Examination  Mental status/Cognition: Alert, oriented to self, place, month and year, good attention.  Speech/language: Fluent, comprehension intact, object naming intact, repetition intact.  Cranial nerves:   CN II Pupils equal and reactive to light, no VF deficits    CN III,IV,VI EOM intact, no gaze preference or deviation, no nystagmus    CN V normal sensation in V1, V2, and V3 segments bilaterally    CN VII no asymmetry, no nasolabial fold flattening    CN VIII normal hearing to speech   CN IX & X normal palatal elevation, no uvular deviation    CN XI 5/5 head turn and 5/5 shoulder shrug bilaterally    CN XII midline tongue protrusion    Motor:  Muscle bulk: normal, tone normal, pronator drift none tremor none Mvmt Root Nerve  Muscle Right Left Comments  SA C5/6 Ax Deltoid 5 5   EF C5/6 Mc Biceps 5 5   EE C6/7/8  Rad Triceps 5 5   WF C6/7 Med FCR     WE C7/8 PIN ECU     F Ab C8/T1 U ADM/FDI 5 5   HF L1/2/3 Fem Illopsoas 5 5   KE L2/3/4 Fem Quad 5 5   DF L4/5 D Peron Tib Ant 5 5   PF S1/2 Tibial Grc/Sol 5 5    Reflexes:  Right Left Comments  Pectoralis      Biceps (C5/6) 1 1   Brachioradialis (C5/6) 1 1    Triceps (C6/7) 1 1    Patellar (L3/4) 1 1    Achilles (S1)      Hoffman      Plantar     Jaw jerk    Sensation:  Light touch intact   Pin prick    Temperature    Vibration   Proprioception    Coordination/Complex Motor:  - Finger to Nose with ataxia and past pointing in LUE - Heel to shin with ataxia in LLE - Rapid alternating movement are slowed in LUe and LLE - Gait: did not assess due to fall risk.  Labs   CBC:  Recent Labs  Lab 01/02/21 2024 01/04/21 0116  WBC 7.4 7.3  NEUTROABS  --  3.7  HGB 16.0 16.1  HCT 46.4 46.5  MCV 94.9 94.7  PLT 191 193    Basic Metabolic Panel:  Lab Results  Component Value Date   NA 136 01/04/2021   K 3.4 (L) 01/04/2021   CO2 23 01/04/2021  GLUCOSE 93 01/04/2021   BUN 16 01/04/2021   CREATININE 1.30 (H) 01/04/2021   CALCIUM 9.2 01/04/2021   GFRNONAA >60 01/04/2021   GFRAA 64 01/13/2018   Lipid Panel:  Lab Results  Component Value Date   LDLCALC 134 (H) 01/04/2021   HgbA1c:  Lab Results  Component Value Date   HGBA1C 5.4 01/13/2018   Urine Drug Screen: No results found for: LABOPIA, COCAINSCRNUR, LABBENZ, AMPHETMU, THCU, LABBARB  Alcohol Level No results found for: [facility]  CT Head without contrast:(personally reviewed)  1. Subacute left cerebellar infarction. 2. No acute intracranial hemorrhage.   MR Angio head and neck:(personally reviewed) No LVO.  Posterior circulation: Right dominant vertebral artery. Symmetrically flowing superior cerebellar arteries. Small distal basilar in the setting of fetal type PCA flow. No visible branch occlusion, beading, or aneurysm.  MRI Brain:(personally reviewed) 1. Acute  left superior cerebellar artery territory infarct. Small acute infarcts also in the brainstem and right cerebellum. 2. Small subacute right frontal cortex infarct. There is also evidence of a remote left frontal infarct. Question recurrent central embolic source.   Impression   Drew Cuevas is a 50 y.o. male with PMH significant for with PMH significant for history of bronchitis, cancer, GERD, HIV, HTN, renal mass and renal insufficiency who presents with 3-4 days of vertigo, nasuea, vomiting and difficult ambulating. Found to have an acute L cerebellar stroke. His neurologic examination is notable for ataxia in LUE and LLE.  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   Secondary Diagnosis: Essential (primary) hypertension  Recommendations  Plan:   - Recommend obtaining TTE with bubble study - Recommend obtaining Lipid panel with LDL - Please start statin if LDL > 70 - Recommend HbA1c - Antithrombotic - Plavix 75mg  daily(anaphylaxis to Ibuprofen in the past) - Recommend DVT ppx - SBP goal - permissive hypertension first 24 h < 220/110. Held home meds.  - Recommend Telemetry monitoring for arrythmia - Recommend bedside swallow screen prior to PO intake. - Stroke education booklet - Recommend PT/OT/SLP consult - Recommend Urine Tox screen.  ______________________________________________________________________   Thank you for the opportunity to take part in the care of this patient. If you have any further questions, please contact the neurology consultation attending.  Signed,  [place] Pager Number [phone] _ _ _   _ __   _ __ _ _  __ __   _ __   __ _

## 2021-01-04 NOTE — Evaluation (Signed)
Occupational Therapy Evaluation Patient Details Name: Drew Cuevas MRN: [id number] DOB: 1970/12/28 Today's Date: 01/04/2021    History of Present Illness 50yo male admitted 01/03/21 with c/o sudden onset HA, dizziness, n/v, and difficulty ambulating. MRI shows acute infarcts in L superior cerebellar artery territory, brainstem, and R cerebellum, also with evidence for subacute R frontal cortex infarct as well as remote left frontal infarct. No tpa given, out of window. PMH kidney CA s/p partial nephrectomy, HIV, HTN   Clinical Impression   PTA, pt was living at home with his husband, pt reports he was independent with ADL/IADL and functional mobility. Pt currently requires minguard assistance for functional mobility at RW level. Pt demonstrates impulsivity during session and requires cues for safe mobility, pt did request RW prior to mobility. Pt's spouse present during session and demonstrated ability to safely and appropriately assist pt with functional mobility. Pt demonstrates vestibular limitations during mobility, educated pt on importance of taking his time during mobility. Pt also endorses diplopia and blurry vision, assessment was limited secondary to pt reporting headache. Due to decline in current level of function, pt would benefit from acute OT to address established goals to facilitate safe D/C to venue listed below. At this time, recommend outpatient OT follow-up. Will continue to follow acutely.     Follow Up Recommendations  Outpatient OT    Equipment Recommendations  None recommended by OT    Recommendations for Other Services       Precautions / Restrictions Precautions Precautions: Fall;Other (comment) Precaution Comments: possible R LQ vision impairment, central vestibular symptoms Restrictions Weight Bearing Restrictions: No      Mobility Bed Mobility Overal bed mobility: Modified Independent             General bed mobility comments: HOB elevated, use of  rail    Transfers Overall transfer level: Needs assistance Equipment used: Rolling walker (2 wheeled) Transfers: Sit to/from Stand Sit to Stand: Min guard         General transfer comment: minguard for safety, pt with wide base of support    Balance Overall balance assessment: Mild deficits observed, not formally tested                                         ADL either performed or assessed with clinical judgement   ADL Overall ADL's : Needs assistance/impaired Eating/Feeding: Set up;Sitting   Grooming: Min guard;Standing   Upper Body Bathing: Set up;Sitting   Lower Body Bathing: Min guard;Sit to/from stand   Upper Body Dressing : Set up;Sitting   Lower Body Dressing: Min guard;Sit to/from stand   Toilet Transfer: Min guard;Ambulation;RW   Toileting- Water quality scientist and Hygiene: Min guard;Sit to/from stand       Functional mobility during ADLs: Min guard;Rolling walker General ADL Comments: pt's husband present during session and demonstrated ability to safely and appropriately assist pt during functional mobility;     Vision Baseline Vision/History: Wears glasses Wears Glasses: Reading only Patient Visual Report: Blurring of vision;Diplopia Vision Assessment?: Yes Eye Alignment: Within Functional Limits Ocular Range of Motion: Within Functional Limits Alignment/Gaze Preference: Within Defined Limits Tracking/Visual Pursuits: Able to track stimulus in all quads without difficulty Saccades: Within functional limits Convergence: Within functional limits Visual Fields: Right visual field deficit;Other (comment) (inferior quadrant) Diplopia Assessment: Disappears with one eye closed Additional Comments: limited this session secondary to headache, will benefit from further  assessment     Perception     Praxis      Pertinent Vitals/Pain Pain Assessment: 0-10 Pain Score: 8  Pain Location: headache Pain Descriptors / Indicators:  Aching;Discomfort Pain Intervention(s): Limited activity within patient's tolerance;Monitored during session     Hand Dominance Right   Extremity/Trunk Assessment Upper Extremity Assessment Upper Extremity Assessment: Overall WFL for tasks assessed   Lower Extremity Assessment Lower Extremity Assessment: Overall WFL for tasks assessed   Cervical / Trunk Assessment Cervical / Trunk Assessment: Normal   Communication Communication Communication: No difficulties   Cognition Arousal/Alertness: Awake/alert Behavior During Therapy: Anxious;WFL for tasks assessed/performed Overall Cognitive Status: Within Functional Limits for tasks assessed                                 General Comments: amotional lability during session, pt appears anxious regarding recovery from strokes and from covid   General Comments  HR 120bpm with exertion, SpO2 >94% RA throughout session    Exercises     Shoulder Instructions      Home Living Family/patient expects to be discharged to:: Private residence Living Arrangements: Spouse/significant other Available Help at Discharge: Family;Available 24 hours/day Type of Home: Other(Comment) (trailer) Home Access: Stairs to enter [employer] of Steps: 5-6 STE with B rails Entrance Stairs-Rails: Can reach both Home Layout: One level     Bathroom Shower/Tub: Teacher, early years/pre: Standard     Home Equipment: None   Additional Comments: completely independent at baseline, lots of room to use assistive devices if needed. Works as Physiological scientist.      Prior Functioning/Environment Level of Independence: Independent        Comments: started having falls and stumbles when stroke symptoms started        OT Problem List: Impaired balance (sitting and/or standing);Decreased knowledge of use of DME or AE;Cardiopulmonary status limiting activity;Pain      OT Treatment/Interventions: Self-care/ADL  training;Therapeutic exercise;Energy conservation;Therapeutic activities;Patient/family education;Balance training;Visual/perceptual remediation/compensation    OT Goals(Current goals can be found in the care plan section) Acute Rehab OT Goals Patient Stated Goal: make sure he fully recovers OT Goal Formulation: With patient Time For Goal Achievement: 01/18/21 Potential to Achieve Goals: Good ADL Goals Pt Will Perform Grooming: Independently;standing Pt Will Transfer to Toilet: Independently;ambulating Additional ADL Goal #1: Pt will demonstrate anticipatory awareness for safe engagement of ADL/IADL and functional mobility.  OT Frequency: Min 2X/week   Barriers to D/C:            Co-evaluation              AM-PAC OT "6 Clicks" Daily Activity     Outcome Measure Help from another person eating meals?: A Little Help from another person taking care of personal grooming?: A Little Help from another person toileting, which includes using toliet, bedpan, or urinal?: A Little Help from another person bathing (including washing, rinsing, drying)?: A Little Help from another person to put on and taking off regular upper body clothing?: A Little Help from another person to put on and taking off regular lower body clothing?: A Little 6 Click Score: 18   End of Session Equipment Utilized During Treatment: Gait belt Nurse Communication: Mobility status;Patient requests pain meds  Activity Tolerance: Patient tolerated treatment well Patient left: in bed;with call bell/phone within reach;with family/visitor present  OT Visit Diagnosis: Other abnormalities of gait and mobility (R26.89);Low vision, both  eyes (H54.2);Pain Pain - part of body:  (headache)                Time: 1252-7129 OT Time Calculation (min): 28 min Charges:  OT General Charges $OT Visit: 1 Visit OT Evaluation $OT Eval Moderate Complexity: 1 Mod OT Treatments $Self Care/Home Management : 8-22 mins  Helene Kelp  OTR/L Acute Rehabilitation Services Office: [place] 01/04/2021, 1:51 PM

## 2021-01-04 NOTE — Progress Notes (Signed)
PROGRESS NOTE                                                                                                                                                                                                             Patient Demographics:    Drew Cuevas, is a 50 y.o. male, DOB - Oct 22, 1970, IWP:[id number]  Outpatient Primary MD for the patient is Ladell Pier, MD   Admit date - 01/02/2021   LOS - 1  Chief Complaint  Patient presents with  . Nausea  . Headache       Brief Narrative: Patient is a 50 y.o. male with PMHx of HIV, HTN, HLD, CKD stage IIIa, right kidney RCC-presented with 3-4-day history of vertigo, vomiting, difficulty ambulating-was found to have acute CVA and COVID-19 infection.  He was subsequently admitted to the hospitalist service for further evaluation and treatment.  COVID-19 vaccinated status: Vaccinated including booster.  Significant Events: 6/6>> Admit to [facility] for CVA  Significant studies: 6/7>> MRI brain: Acute left superior cerebral artery territory infarct, small infarct in the right cerebellum and left upper pons, small subacute infarct in the high right frontal cortex 6/7>> Brain MRA: Negative for significant stenosis 6/7>> Neck MRA: Negative for significant stenosis 6/8>> Echo: EF 60-65% 6/8>> UDS: Positive for opiates/cannabinoids 6/8>> LDL: 134  COVID-19 medications: Remdesivir: 6/7>>  Antibiotics: None  Microbiology data: 6/7>> COVID PCR: Positive  Procedures: None  Consults: Neurology  DVT prophylaxis: enoxaparin (LOVENOX) injection 40 mg Start: 01/03/21 1000    Subjective:    Drew Cuevas today continues to have some headache-but has improved-currently mild.  Headaches are very typical of his migraine headaches.   Assessment  & Plan :   Acute CVA: Likely embolic-no evidence of A. fib on telemetry/EKG-could be due to hypercoagulable state from [place].   Await further work-up-continue Plavix and high intensity statin.  Await further recommendations from stroke MD.  COVID-19 infection: No major respiratory symptoms-continue Remdesivir x3 days.  Headache: Patient-this is very typical of his migraine headaches-currently improved with just Tylenol.  If reoccurs-can consider IV Depakene.  Hypokalemia: Replete and recheck  HTN: BP reasonable-allow permissive hypertension for a few days-resume amlodipine when able  HIV: Continue ART  Anxiety/depression: Continue Lexapro/trazodone  CKD stage IIIa: At baseline  Tobacco abuse: Counseled regarding cessation.  Obesity: Estimated body mass index is 37.[place]  kg/m as calculated from the following:   Height as of this encounter: 5\' 6"  (1.676 m).   Weight as of this encounter: 105.7 kg.    ABG:    Component Value Date/Time   TCO2 25 09/21/2011 1709    Vent Settings: N/A    Condition - Stable  Family Communication  :  Spouse at bedside  Code Status :  Full Code  Diet :  Diet Order            Diet Heart Room service appropriate? Yes; Fluid consistency: Thin  Diet effective now                  Disposition Plan  :   Status is: Inpatient  Remains inpatient appropriate because:Inpatient level of care appropriate due to severity of illness   Dispo: The patient is from: Home              Anticipated d/c is to: Home              Patient currently is not medically stable to d/c.   Difficult to place patient No   Barriers to discharge: Acute CVA/COVID-19 infection-work-up ongoing.  Antimicorbials  :    Anti-infectives (From admission, onward)   Start     Dose/Rate Route Frequency Ordered Stop   01/04/21 1000  remdesivir 100 mg in sodium chloride 0.9 % 100 mL IVPB       "Followed by" Linked Group Details   100 mg 200 mL/hr over 30 Minutes Intravenous Daily 01/03/21 0911 01/06/21 0959   01/03/21 1100  remdesivir 200 mg in sodium chloride 0.9% 250 mL IVPB       "Followed by"  Linked Group Details   200 mg 580 mL/hr over 30 Minutes Intravenous Once 01/03/21 0911 01/03/21 1333   01/03/21 1000  abacavir-dolutegravir-lamiVUDine (TRIUMEQ) 600-50-300 MG per tablet 1 tablet        1 tablet Oral Daily 01/03/21 0911        Inpatient Medications  Scheduled Meds: .  stroke: mapping our early stages of recovery book   Does not apply Once  . abacavir-dolutegravir-lamiVUDine  1 tablet Oral Daily  . amLODipine  10 mg Oral Daily  . atorvastatin  80 mg Oral Daily  . clopidogrel  75 mg Oral Daily  . enoxaparin (LOVENOX) injection  40 mg Subcutaneous Q24H  . escitalopram  10 mg Oral Daily  . sodium chloride flush  3 mL Intravenous Q12H  . traZODone  25 mg Oral QHS   Continuous Infusions: . remdesivir 100 mg in NS 100 mL 100 mg (01/04/21 0959)   PRN Meds:.acetaminophen **OR** acetaminophen (TYLENOL) oral liquid 160 mg/5 mL **OR** acetaminophen, albuterol   Time Spent in minutes  35   See all Orders from today for further details   Oren Binet M.D on 01/04/2021 at 2:35 PM  To page go to [email or web address] - use universal password  [facility] -  Office  [phone]    Objective:   Vitals:   01/03/21 2347 01/04/21 0338 01/04/21 0811 01/04/21 1146  BP: 131/90 (!) 139/101 (!) 153/82 (!) 143/90  Pulse: 98 94 90 100  Resp: 18 19 18 18   Temp: 98.2 F (36.8 C) 98.5 F (36.9 C) 98 F (36.7 C) 99 F (37.2 C)  TempSrc: Oral Oral Oral Oral  SpO2: 97% 97% 91% 96%  Weight:      Height:        Wt Readings from Last 3 Encounters:  01/02/21  105.7 kg  12/30/20 105.4 kg  10/04/20 102.2 kg     Intake/Output Summary (Last 24 hours) at 01/04/2021 1435 Last data filed at 01/04/2021 1147 Gross per 24 hour  Intake 360 ml  Output --  Net 360 ml     Physical Exam Gen Exam:Alert awake-not in any distress HEENT:atraumatic, normocephalic Chest: B/L clear to auscultation anteriorly CVS:S1S2 regular Abdomen:soft non tender, non distended Extremities:no  edema Neurology: Non focal Skin: no rash   Data Review:    CBC Recent Labs  Lab 01/02/21 2024 01/04/21 0116  WBC 7.4 7.3  HGB 16.0 16.1  HCT 46.4 46.5  PLT 191 165  MCV 94.9 94.7  MCH 32.7 32.8  MCHC 34.5 34.6  RDW 13.8 13.3  LYMPHSABS  --  2.9  MONOABS  --  0.5  EOSABS  --  0.1  BASOSABS  --  0.0    Chemistries  Recent Labs  Lab 01/02/21 2024 01/04/21 0116  NA 137 136  K 3.3* 3.4*  CL 105 103  CO2 25 23  GLUCOSE 94 93  BUN 11 16  CREATININE 1.23 1.30*  CALCIUM 9.1 9.2  AST 20 18  ALT 29 22  ALKPHOS 82 76  BILITOT 0.4 0.7   ------------------------------------------------------------------------------------------------------------------ Recent Labs    01/04/21 0116  CHOL 198  HDL 29*  LDLCALC 134*  TRIG 174*  CHOLHDL 6.8    Lab Results  Component Value Date   HGBA1C 5.4 01/13/2018   ------------------------------------------------------------------------------------------------------------------ No results for input(s): TSH, T4TOTAL, T3FREE, THYROIDAB in the last 72 hours.  Invalid input(s): FREET3 ------------------------------------------------------------------------------------------------------------------ Recent Labs    01/04/21 0116  FERRITIN 274    Coagulation profile No results for input(s): INR, PROTIME in the last 168 hours.  Recent Labs    01/03/21 2046 01/04/21 0116  DDIMER 0.38 0.77*    Cardiac Enzymes No results for input(s): CKMB, TROPONINI, MYOGLOBIN in the last 168 hours.  Invalid input(s): CK ------------------------------------------------------------------------------------------------------------------ No results found for: BNP  Micro Results Recent Results (from the past 240 hour(s))  Resp Panel by RT-PCR (Flu A&B, Covid) Nasopharyngeal Swab     Status: Abnormal   Collection Time: 01/03/21  4:18 AM   Specimen: Nasopharyngeal Swab; Nasopharyngeal(NP) swabs in vial transport medium  Result Value Ref Range  Status   SARS Coronavirus 2 by RT PCR POSITIVE (A) NEGATIVE Final    Comment: CRITICAL RESULT CALLED TO, READ BACK BY AND VERIFIED WITH: NASH J. ON 01/03/2021 @ 0741 BY MECIAL J. (NOTE) SARS-CoV-2 target nucleic acids are DETECTED.  The SARS-CoV-2 RNA is generally detectable in upper respiratory specimens during the acute phase of infection. Positive results are indicative of the presence of the identified virus, but do not rule out bacterial infection or co-infection with other pathogens not detected by the test. Clinical correlation with patient history and other diagnostic information is necessary to determine patient infection status. The expected result is Negative.  Fact Sheet for Patients: EntrepreneurPulse.com.au  Fact Sheet for Healthcare Providers: IncredibleEmployment.be  This test is not yet approved or cleared by the [place] FDA and  has been authorized for detection and/or diagnosis of SARS-CoV-2 by FDA under an Emergency Use Authorization (EUA).  This EUA will remain in effect (meaning  this test can be used) for the duration of  the COVID-19 declaration under Section 564(b)(1) of the Act, 21 U.S.C. section 360bbb-3(b)(1), unless the authorization is terminated or revoked sooner.     Influenza A by PCR NEGATIVE NEGATIVE Final   Influenza B  by PCR NEGATIVE NEGATIVE Final    Comment: (NOTE) The Xpert Xpress SARS-CoV-2/FLU/RSV plus assay is intended as an aid in the diagnosis of influenza from Nasopharyngeal swab specimens and should not be used as a sole basis for treatment. Nasal washings and aspirates are unacceptable for Xpert Xpress SARS-CoV-2/FLU/RSV testing.  Fact Sheet for Patients: EntrepreneurPulse.com.au  Fact Sheet for Healthcare Providers: IncredibleEmployment.be  This test is not yet approved or cleared by the [place] FDA and has been authorized for detection  and/or diagnosis of SARS-CoV-2 by FDA under an Emergency Use Authorization (EUA). This EUA will remain in effect (meaning this test can be used) for the duration of the COVID-19 declaration under Section 564(b)(1) of the Act, 21 U.S.C. section 360bbb-3(b)(1), unless the authorization is terminated or revoked.  Performed at [facility], [place] [address] [place], [place] [address]     Radiology Reports CT Head Wo Contrast  Result Date: 01/03/2021 CLINICAL DATA:  Ataxia, nontraumatic. Stroke excluded. Headache, unable to wall, 4 days. Hypertensive medication. Right kidney cancer. EXAM: CT HEAD WITHOUT CONTRAST TECHNIQUE: Contiguous axial images were obtained from the base of the skull through the vertex without intravenous contrast. COMPARISON:  CT head 10/01/2014 FINDINGS: Brain: Gray-white matter differentiation of the left cerebellum with associated edema. No parenchymal hemorrhage. No mass lesion. No extra-axial collection. No midline shift. No hydrocephalus. Basilar cisterns are patent. Vascular: No hyperdense vessel. Skull: No acute fracture or focal lesion. Sinuses/Orbits: Paranasal sinuses and mastoid air cells are clear. The orbits are unremarkable. Other: None. IMPRESSION: 1. Subacute left cerebellar infarction. 2. No acute intracranial hemorrhage. These results were called by telephone at the time of interpretation on 01/03/2021 at 6:44 am to provider [facility] , who verbally acknowledged these results. Electronically Signed   By: Iven Finn M.D.   On: 01/03/2021 06:43   MR ANGIO HEAD WO CONTRAST  Result Date: 01/03/2021 CLINICAL DATA:  Neuro deficit with acute stroke suspected EXAM: MRI HEAD WITHOUT CONTRAST MRA HEAD WITHOUT CONTRAST MRA OF THE NECK WITHOUT AND WITH CONTRAST TECHNIQUE: Multiplanar, multi-echo pulse sequences of the brain and surrounding structures were acquired without intravenous contrast. Angiographic images of the Circle of Willis were acquired  using MRA technique without intravenous contrast. Angiographic images of the neck were acquired using MRA technique without and with intravenous contrast. Carotid stenosis measurements (when applicable) are obtained utilizing NASCET criteria, using the distal internal carotid diameter as the denominator. CONTRAST:  71mL GADAVIST GADOBUTROL 1 MMOL/ML IV SOLN COMPARISON:  Head CT from earlier today FINDINGS: MR HEAD FINDINGS Brain: Confluent acute infarct in the left superior cerebellar artery distribution. Small infarcts also in the right cerebellum and in the left upper pons. Small subacute appearing infarct along the high right frontal cortex. T2 hyperintensity in the left centrum semiovale which has a triangular-shaped on coronal T2 weighted imaging, extending from ventricle to cortex, new from 2016 and presumably ischemic. No detected swelling/masslike features. No generalized white matter disease. Vascular: See below Skull and upper cervical spine: Normal marrow signal Sinuses/Orbits: Negative MRA HEAD FINDINGS Anterior circulation: Allowing for intermittent generalized motion artifact vessels are smoothly contoured and widely patent without aneurysm or vascular malformation Posterior circulation: Right dominant vertebral artery. Symmetrically flowing superior cerebellar arteries. Small distal basilar in the setting of fetal type PCA flow. No visible branch occlusion, beading, or aneurysm. Anatomic variants:As above MRA NECK FINDINGS Aortic arch: Normal. Right carotid system: No stenosis or embolic source seen. Left carotid system: No stenosis, irregularity, or and folic  sore seen. Vertebral arteries: No proximal subclavian stenosis. Right dominant vertebral artery. Limitation around the left vertebral artery distally due to obscuration by venous plexus. No visible stenosis, beading, or dissection. IMPRESSION: Brain MRI: 1. Acute left superior cerebellar artery territory infarct. Small acute infarcts also in the  brainstem and right cerebellum. 2. Small subacute right frontal cortex infarct. There is also evidence of a remote left frontal infarct. Question recurrent central embolic source. Intracranial MRA: Negative Neck MRA: Negative. Electronically Signed   By: Monte Fantasia M.D.   On: 01/03/2021 08:18   MR Angiogram Neck W or Wo Contrast  Result Date: 01/03/2021 CLINICAL DATA:  Neuro deficit with acute stroke suspected EXAM: MRI HEAD WITHOUT CONTRAST MRA HEAD WITHOUT CONTRAST MRA OF THE NECK WITHOUT AND WITH CONTRAST TECHNIQUE: Multiplanar, multi-echo pulse sequences of the brain and surrounding structures were acquired without intravenous contrast. Angiographic images of the Circle of Willis were acquired using MRA technique without intravenous contrast. Angiographic images of the neck were acquired using MRA technique without and with intravenous contrast. Carotid stenosis measurements (when applicable) are obtained utilizing NASCET criteria, using the distal internal carotid diameter as the denominator. CONTRAST:  31mL GADAVIST GADOBUTROL 1 MMOL/ML IV SOLN COMPARISON:  Head CT from earlier today FINDINGS: MR HEAD FINDINGS Brain: Confluent acute infarct in the left superior cerebellar artery distribution. Small infarcts also in the right cerebellum and in the left upper pons. Small subacute appearing infarct along the high right frontal cortex. T2 hyperintensity in the left centrum semiovale which has a triangular-shaped on coronal T2 weighted imaging, extending from ventricle to cortex, new from 2016 and presumably ischemic. No detected swelling/masslike features. No generalized white matter disease. Vascular: See below Skull and upper cervical spine: Normal marrow signal Sinuses/Orbits: Negative MRA HEAD FINDINGS Anterior circulation: Allowing for intermittent generalized motion artifact vessels are smoothly contoured and widely patent without aneurysm or vascular malformation Posterior circulation: Right  dominant vertebral artery. Symmetrically flowing superior cerebellar arteries. Small distal basilar in the setting of fetal type PCA flow. No visible branch occlusion, beading, or aneurysm. Anatomic variants:As above MRA NECK FINDINGS Aortic arch: Normal. Right carotid system: No stenosis or embolic source seen. Left carotid system: No stenosis, irregularity, or and folic sore seen. Vertebral arteries: No proximal subclavian stenosis. Right dominant vertebral artery. Limitation around the left vertebral artery distally due to obscuration by venous plexus. No visible stenosis, beading, or dissection. IMPRESSION: Brain MRI: 1. Acute left superior cerebellar artery territory infarct. Small acute infarcts also in the brainstem and right cerebellum. 2. Small subacute right frontal cortex infarct. There is also evidence of a remote left frontal infarct. Question recurrent central embolic source. Intracranial MRA: Negative Neck MRA: Negative. Electronically Signed   By: Monte Fantasia M.D.   On: 01/03/2021 08:18   MR BRAIN WO CONTRAST  Result Date: 01/03/2021 CLINICAL DATA:  Neuro deficit with acute stroke suspected EXAM: MRI HEAD WITHOUT CONTRAST MRA HEAD WITHOUT CONTRAST MRA OF THE NECK WITHOUT AND WITH CONTRAST TECHNIQUE: Multiplanar, multi-echo pulse sequences of the brain and surrounding structures were acquired without intravenous contrast. Angiographic images of the Circle of Willis were acquired using MRA technique without intravenous contrast. Angiographic images of the neck were acquired using MRA technique without and with intravenous contrast. Carotid stenosis measurements (when applicable) are obtained utilizing NASCET criteria, using the distal internal carotid diameter as the denominator. CONTRAST:  72mL GADAVIST GADOBUTROL 1 MMOL/ML IV SOLN COMPARISON:  Head CT from earlier today FINDINGS: MR HEAD FINDINGS Brain: Confluent acute  infarct in the left superior cerebellar artery distribution. Small infarcts  also in the right cerebellum and in the left upper pons. Small subacute appearing infarct along the high right frontal cortex. T2 hyperintensity in the left centrum semiovale which has a triangular-shaped on coronal T2 weighted imaging, extending from ventricle to cortex, new from 2016 and presumably ischemic. No detected swelling/masslike features. No generalized white matter disease. Vascular: See below Skull and upper cervical spine: Normal marrow signal Sinuses/Orbits: Negative MRA HEAD FINDINGS Anterior circulation: Allowing for intermittent generalized motion artifact vessels are smoothly contoured and widely patent without aneurysm or vascular malformation Posterior circulation: Right dominant vertebral artery. Symmetrically flowing superior cerebellar arteries. Small distal basilar in the setting of fetal type PCA flow. No visible branch occlusion, beading, or aneurysm. Anatomic variants:As above MRA NECK FINDINGS Aortic arch: Normal. Right carotid system: No stenosis or embolic source seen. Left carotid system: No stenosis, irregularity, or and folic sore seen. Vertebral arteries: No proximal subclavian stenosis. Right dominant vertebral artery. Limitation around the left vertebral artery distally due to obscuration by venous plexus. No visible stenosis, beading, or dissection. IMPRESSION: Brain MRI: 1. Acute left superior cerebellar artery territory infarct. Small acute infarcts also in the brainstem and right cerebellum. 2. Small subacute right frontal cortex infarct. There is also evidence of a remote left frontal infarct. Question recurrent central embolic source. Intracranial MRA: Negative Neck MRA: Negative. Electronically Signed   By: Monte Fantasia M.D.   On: 01/03/2021 08:18   ECHOCARDIOGRAM LIMITED  Result Date: 01/04/2021    ECHOCARDIOGRAM LIMITED REPORT   Patient Name:   ROMAN DUBUC Date of Exam: 01/04/2021 Medical Rec #:  [id number]    Height:       66.0 in Accession #:    [id number]    Weight:       233.0 lb Date of Birth:  Jan 03, 1971   BSA:          2.134 m Patient Age:    81 years     BP:           153/82 mmHg Patient Gender: M            HR:           95 bpm. Exam Location:  Inpatient Procedure: Limited Echo, Color Doppler and Cardiac Doppler Indications:    Stroke i63.9  History:        Patient has no prior history of Echocardiogram examinations.                 Risk Factors:Hypertension and Dyslipidemia.  Sonographer:    Raquel Sarna Senior RDCS Referring Phys: [id number] [facility]  Sonographer Comments: COVID+ at time of study IMPRESSIONS  1. Left ventricular ejection fraction, by estimation, is 60 to 65%. The left ventricle has normal function. The left ventricle has no regional wall motion abnormalities.  2. Right ventricular systolic function is normal. The right ventricular size is normal.  3. The mitral valve is normal in structure. Trivial mitral valve regurgitation. No evidence of mitral stenosis.  4. The aortic valve is normal in structure. Aortic valve regurgitation is not visualized. No aortic stenosis is present.  5. The inferior vena cava is normal in size with greater than 50% respiratory variability, suggesting right atrial pressure of 3 mmHg. Conclusion(s)/Recommendation(s): No intracardiac source of embolism detected on this transthoracic study. A transesophageal echocardiogram is recommended to exclude cardiac source of embolism if clinically indicated. FINDINGS  Left Ventricle: Left ventricular ejection fraction, by estimation, is  60 to 65%. The left ventricle has normal function. The left ventricle has no regional wall motion abnormalities. The left ventricular internal cavity size was normal in size. There is  no left ventricular hypertrophy. Right Ventricle: The right ventricular size is normal. No increase in right ventricular wall thickness. Right ventricular systolic function is normal. Left Atrium: Left atrial size was normal in size. Right Atrium: Right atrial size  was normal in size. Pericardium: There is no evidence of pericardial effusion. Mitral Valve: The mitral valve is normal in structure. Trivial mitral valve regurgitation. No evidence of mitral valve stenosis. Tricuspid Valve: The tricuspid valve is normal in structure. Tricuspid valve regurgitation is not demonstrated. No evidence of tricuspid stenosis. Aortic Valve: The aortic valve is normal in structure. Aortic valve regurgitation is not visualized. No aortic stenosis is present. Pulmonic Valve: The pulmonic valve was normal in structure. Pulmonic valve regurgitation is not visualized. No evidence of pulmonic stenosis. Aorta: The aortic root is normal in size and structure. Venous: The inferior vena cava is normal in size with greater than 50% respiratory variability, suggesting right atrial pressure of 3 mmHg. IAS/Shunts: No atrial level shunt detected by color flow Doppler. RIGHT VENTRICLE RV S prime:     18.80 cm/s TAPSE (M-mode): 1.8 cm AORTIC VALVE LVOT Vmax:   87.50 cm/s LVOT Vmean:  61.600 cm/s LVOT VTI:    0.126 m  SHUNTS Systemic VTI: 0.13 m Candee Furbish MD Electronically signed by Candee Furbish MD Signature Date/Time: 01/04/2021/1:01:07 PM    Final

## 2021-01-04 NOTE — Evaluation (Signed)
Speech Language Pathology Evaluation Patient Details Name: Drew Cuevas MRN: [id number] DOB: 08/08/1970 Today's Date: 01/04/2021 Time: 4098-1191 SLP Time Calculation (min) (ACUTE ONLY): 16.82 min  Problem List:  Patient Active Problem List   Diagnosis Date Noted  . Cerebellar stroke (HCC) 01/03/2021  . COVID-19 virus infection 01/03/2021  . Diverticulitis of large intestine with abscess 09/27/2020  . Depression 06/19/2019  . Other insomnia 06/19/2019  . Chronic kidney disease, stage 3a (HCC) 04/15/2018  . Tobacco dependence 01/13/2018  . Primary osteoarthritis of right knee 04/03/2017  . Dyslipidemia 06/14/2016  . Renal cell carcinoma of right kidney (HCC) 08/19/2013  . Renal cyst 05/21/2013  . Obesity (BMI 30-39.9) 05/26/2012  . Internal hemorrhoids 12/01/2008  . Hypertension 04/28/2008  . Major depressive disorder, single episode, severe (HCC) 12/20/2006  . Human immunodeficiency virus (HIV) disease (HCC) 08/28/2006   Past Medical History:  Past Medical History:  Diagnosis Date  . Bronchitis    LAST FLARE UP WAS NEW YEARS 2015  . Cancer (HCC)    kidney  . GERD (gastroesophageal reflux disease)    NO MEDS  . HIV (human immunodeficiency virus infection) (HCC)    UNDER CONTROL WITH MEDICATIONS  . Hypertension   . Immune deficiency disorder (HCC)   . Renal insufficiency 05/17/2015  . Renal mass, right    Past Surgical History:  Past Surgical History:  Procedure Laterality Date  . NO PAST SURGERIES    . ROBOT ASSISTED LAPAROSCOPIC NEPHRECTOMY Right 08/19/2013   Procedure: ROBOTIC ASSISTED LAPAROSCOPIC RIGHT PARTIAL NEPHRECTOMY;  Surgeon: Crist Fat, MD;  Location: [facility];  Service: Urology;  Laterality: Right;   HPI:  50yo male admitted 01/03/21 with c/o sudden onset HA, dizziness, n/v, and difficulty ambulating. MRI shows acute infarcts in L superior cerebellar artery territory, brainstem, and R cerebellum, also with evidence for subacute R frontal cortex infarct  as well as remote left frontal infarct. No tpa given, out of window. PMH kidney CA s/p partial nephrectomy, HIV, HTN   Assessment / Plan / Recommendation Clinical Impression  Pt participated in speech/language/cognition evaluation evaluation with his spouse, Drew Fus, present. Pt reported that he completed two years of college and works as a Production designer, theatre/television/film at a [employer]. Both parties denied the pt having any baseline or acute deficits in speech, language, or cognition. The [facility] Mental Status Examination was completed to evaluate the pt's cognitive-linguistic skills. He achieved a score of 27/30 which is within the normal limits of 27 or more out of 30. No speech or language deficits were noted and his performance on informal cognitive-linguistic tasks was within normal limits. Further skilled SLP services are not clinically indicated at this time. Pt and his spouse were educated regarding this and both parties verbalized understanding as well as agreement with plan of care.    SLP Assessment  SLP Recommendation/Assessment: Patient does not need any further Speech Lanaguage Pathology Services SLP Visit Diagnosis: Cognitive communication deficit (R41.841)    Follow Up Recommendations  None    Frequency and Duration           SLP Evaluation Cognition  Overall Cognitive Status: Within Functional Limits for tasks assessed Arousal/Alertness: Awake/alert Orientation Level: Oriented X4 Attention: Focused;Sustained Focused Attention: Appears intact Sustained Attention: Appears intact Memory: Impaired Memory Impairment: Retrieval deficit;Decreased recall of new information (Immediate: 5/5; delayed: 4/5; with cue: 1/1) Awareness: Appears intact Problem Solving: Appears intact Executive Function: Sequencing;Organizing Sequencing: Appears intact (Clock drawing: 4/4) Organizing: Appears intact (Backward digit span: 3/3)  Comprehension  Auditory Comprehension Overall Auditory  Comprehension: Appears within functional limits for tasks assessed Yes/No Questions: Within Functional Limits Commands: Within Functional Limits Conversation: Complex    Expression Expression Primary Mode of Expression: Verbal Verbal Expression Overall Verbal Expression: Appears within functional limits for tasks assessed Initiation: No impairment Level of Generative/Spontaneous Verbalization: Conversation Pragmatics: No impairment   Oral / Motor  Oral Motor/Sensory Function Overall Oral Motor/Sensory Function: Within functional limits Motor Speech Overall Motor Speech: Appears within functional limits for tasks assessed Respiration: Within functional limits Phonation: Normal Resonance: Within functional limits Articulation: Within functional limitis Intelligibility: Intelligible Motor Planning: Witnin functional limits Motor Speech Errors: Not applicable   Drew Cuevas I. Vear Clock, MS, CCC-SLP Acute Rehabilitation Services Office number [phone] Pager [phone]                   Drew Cuevas 01/04/2021, 5:08 PM

## 2021-01-04 NOTE — Progress Notes (Signed)
Echocardiogram 2D Echocardiogram has been performed.  Oneal Deputy Cranford Blessinger 01/04/2021, 11:12 AM

## 2021-01-04 NOTE — Progress Notes (Addendum)
STROKE TEAM PROGRESS NOTE    Interval History  No acute events overnight, patient resting in bed having echo done with his husband at bedside.   Educated about stroke, stroke risk factors, advised to stop smoking    Pertinent Lab Work and Imaging    01/03/21 CT Head WO IV Contrast 1. Subacute left cerebellar infarction. 2. No acute intracranial hemorrhage.  01/03/21 MR Angio Head and Neck  MRA HEAD FINDINGS   Anterior circulation: Allowing for intermittent generalized motion artifact vessels are smoothly contoured and widely patent without aneurysm or vascular malformation   Posterior circulation: Right dominant vertebral artery. Symmetrically flowing superior cerebellar arteries. Small distal basilar in the setting of fetal type PCA flow. No visible branch occlusion, beading, or aneurysm.   Anatomic variants:As above   MRA NECK FINDINGS   Aortic arch: Normal.   Right carotid system: No stenosis or embolic source seen.   Left carotid system: No stenosis, irregularity, or and folic sore seen.   Vertebral arteries: No proximal subclavian stenosis. Right dominant vertebral artery. Limitation around the left vertebral artery distally due to obscuration by venous plexus. No visible stenosis, beading, or dissection.  01/03/21 MRI Brain WO IV Contrast 1. Acute left superior cerebellar artery territory infarct. Small acute infarcts also in the brainstem and right cerebellum. 2. Small subacute right frontal cortex infarct. There is also evidence of a remote left frontal infarct. Question recurrent central embolic source.  01/04/21 Echocardiogram Complete   1. Left ventricular ejection fraction, by estimation, is 60 to 65%. The  left ventricle has normal function. The left ventricle has no regional  wall motion abnormalities.   2. Right ventricular systolic function is normal. The right ventricular  size is normal.   3. The mitral valve is normal in structure. Trivial mitral valve   regurgitation. No evidence of mitral stenosis.   4. The aortic valve is normal in structure. Aortic valve regurgitation is  not visualized. No aortic stenosis is present.   5. The inferior vena cava is normal in size with greater than 50%  respiratory variability, suggesting right atrial pressure of 3 mmHg.   Conclusion(s)/Recommendation(s): No intracardiac source of embolism  detected on this transthoracic study. A transesophageal echocardiogram is recommended to exclude cardiac source of embolism if clinically indicated.  BLE [place]  Pending    Physical Examination  Pleasant mildly obese middle-aged African-American male not in distress Constitutional: Calm, appropriate for condition  Cardiovascular: Normal RR Respiratory: No increased WOB  Exam limited as patient was undergoing echo cardiogram at the time of physical exam    Mental status: AAOx4, following commands  Speech: Fluent repetition and naming intact.  No dysarthria or aphasia. Cranial nerves: EOMI, VFF, Tongue Midline  Motor: Normal bulk and tone. No drift. Slowed fine motor movements noted to LUE with finger tapping  Coordination: FNF + HTS intact  Gait: Deferred, undergoing echo at the time of exam   Assessment and Plan   Mr. Drew Cuevas is a 50 y.o. male w/pmh of tobacco use, HIV, HTN, HLD, CKD stage IIIa, right kidney RCC who presents with 2-4 day history  of vertigo, vomiting, difficulty ambulating- found to have acute L cerebellar stroke + right cerebellum, right brainstem and right frontal cortex stroke. He was outside of the time window for IVTPA and mechanical thrombectomy.   #L Cerebellum Stroke + R Cerebellum + Right Brainstem + Right Frontal Cortex Stroke  Patient presented with the symptoms described above. MRI Brain revealed stroke in different vascular  territories; posterior and anterior. Vessel imaging with MR Angio Head and Neck personally reviewed, notable for hypoplastic left vertebral artery, fetal  PCA- no significant stenosis to the posterior circulation or anterior circulation. Echo w/EF 60-65 %, LA size normal and no atrial shunt detected by color flow doppler. Stroke labs w/LDL 134 A1C pending. UDS positive for opiates and tetrahydrocannabinol. Stroke etiology is cryptogenic. Will consider ordering hypercoagulable panel in the future as a part of further work up; defer now given acuity with + Covid.  - Cardiac event monitor at discharge  - Continue Plavix 75 mg for secondary stroke prevention ( anaphylaxis to Ibuprofen in the past)  - Continue Atorvastatin 80 mg for secondary stroke prevention  -At discharge please place ambulatory referral to neurology for stroke follow up   #Hypertension He has a history of HTN and takes Amlodipine 10 mg at home. Currently blood pressure is trending in the 130-160 range. He is outside of the permissive htn window given sx started 2-4 days prior to admission. Recommend gradually normalizing blood pressure avoiding acute drops. - Ok to resume home Amlodipine at 5 today then can increase to 10 the following day   #Hyperlipidemia From a stroke prevention stand point, the LDL goal is < 70. LDL 134, Atorvastatin 80 mg already initiated, continue this at discharge in place of home Lovastatin for better LDL control.   #Diabetes Screening Hemoglobin A1C pending   Hospital day # 1  Ruta Hinds, NP  [facility] Nurse Practitioner Patient seen and discussed with attending physician Dr. Leonie Man   I have personally obtained history,examined this patient, reviewed notes, independently viewed imaging studies, participated in medical decision making and plan of care.ROS completed by me personally and pertinent positives fully documented  I have made any additions or clarifications directly to the above note. Agree with note above.  Patient presented with dizziness and ataxia due to left superior cerebellar as well as small right cerebellar infarct of  cryptogenic etiology.  Strong suspicion for cardioembolic source though hypercoagulability from COVID infection could also be responsible.  Continue ongoing stroke work-up.  Continue Plavix for stroke prevention.  Aggressive risk factor modification.  Patient counseled to quit  marijuana and smoking.  Long discussion patient and his husband at the bedside and answered questions.  Greater than 50% time during this 35-minute visit was spent in counseling and coordination of care about his stroke   and answering questions. Drew Contras, MD Medical Director [place] Pager: [phone] 01/04/2021 4:56 PM  To contact Stroke Continuity provider, please refer to [email or web address] After hours, contact General Neurology

## 2021-01-04 NOTE — Evaluation (Signed)
Physical Therapy Evaluation Patient Details Name: Drew Cuevas MRN: [id number] DOB: 1971/01/05 Today's Date: 01/04/2021   History of Present Illness  50yo male admitted 01/03/21 with c/o sudden onset HA, dizziness, n/v, and difficulty ambulating. MRI shows acute infarcts in L superior cerebellar artery territory, brainstem, and R cerebellum, also with evidence for subacute R frontal cortex infarct as well as remote left frontal infarct. No tpa given, out of window. PMH kidney CA s/p partial nephrectomy, HIV, HTN  Clinical Impression   Patient received in bed, very pleasant and cooperative but very anxious regarding his strokes and wanting to make a full recovery; spouse present and very supportive. Actually able to mobilize on a min guard basis with RW and mild left lateral bias, but able to maintain balance with BUE support. VSS on RA with mobility. Does actually have at least moderate central vestibular impairment which is consistent with areas of acute infarcts- has a very hard time stabilizing gaze and seems to have possible RLQ visual field impairment. Spouse able to give appropriate levels of assist. Left up in recliner with all needs met, spouse and RN present. Would recommend skilled PT f/u in neuro OP setting with focus on vestibular rehab moving forward.     Follow Up Recommendations Outpatient PT;Supervision for mobility/OOB;Other (comment) (neuro OP PT, vestibular follow up)    Equipment Recommendations  Rolling walker with 5" wheels    Recommendations for Other Services       Precautions / Restrictions Precautions Precautions: Fall;Other (comment) Precaution Comments: possible R LQ vision impairment, central vestibular symptoms Restrictions Weight Bearing Restrictions: No      Mobility  Bed Mobility Overal bed mobility: Modified Independent             General bed mobility comments: HOB elevated, use of rail    Transfers Overall transfer level: Needs  assistance Equipment used: Rolling walker (2 wheeled) Transfers: Sit to/from Stand Sit to Stand: Min guard         General transfer comment: min guard for safety/to gain balance initially, wide BOS and cues for hand placement/sequencing  Ambulation/Gait Ambulation/Gait assistance: Min guard Gait Distance (Feet): 25 Feet (49f, 150f Assistive device: Rolling walker (2 wheeled) Gait Pattern/deviations: Step-through pattern;Drifts right/left;Wide base of support Gait velocity: decreased   General Gait Details: slow but steady with RW, reports that he feels like he is falling to the left when in reality he maintains midline fairly well. Mild L lateral bias but able to maintain balance with RW/min guard. VSS on RA  Stairs            Wheelchair Mobility    Modified Rankin (Stroke Patients Only)       Balance Overall balance assessment: Mild deficits observed, not formally tested                                           Pertinent Vitals/Pain Pain Assessment: 0-10 Pain Score: 8  Pain Location: headache Pain Descriptors / Indicators: Aching;Discomfort Pain Intervention(s): Limited activity within patient's tolerance;Monitored during session;Premedicated before session    Home Living Family/patient expects to be discharged to:: Private residence Living Arrangements: Spouse/significant other Available Help at Discharge: Family;Available 24 hours/day Type of Home: Other(Comment) (trailer) Home Access: Stairs to enter Entrance Stairs-Rails: Can reach both Entrance Stairs-Number of Steps: 5-6 STE with B rails Home Layout: One level Home Equipment: None Additional Comments: completely independent  at baseline, lots of room to use assistive devices if needed. Works as Physiological scientist.    Prior Function Level of Independence: Independent         Comments: started having falls and stumbles when stroke symptoms started     Hand Dominance    Dominant Hand: Right    Extremity/Trunk Assessment   Upper Extremity Assessment Upper Extremity Assessment: Defer to OT evaluation    Lower Extremity Assessment Lower Extremity Assessment: Overall WFL for tasks assessed    Cervical / Trunk Assessment Cervical / Trunk Assessment: Normal  Communication   Communication: No difficulties  Cognition Arousal/Alertness: Awake/alert Behavior During Therapy: Anxious;WFL for tasks assessed/performed Overall Cognitive Status: Within Functional Limits for tasks assessed                                 General Comments: very anxious about recovering from strokes and covid      General Comments      Exercises     Assessment/Plan    PT Assessment Patient needs continued PT services  PT Problem List Decreased knowledge of use of DME;Obesity;Decreased activity tolerance;Decreased safety awareness;Decreased balance;Decreased mobility;Decreased coordination       PT Treatment Interventions DME instruction;Balance training;Gait training;Stair training;Functional mobility training;Patient/family education;Therapeutic activities;Therapeutic exercise    PT Goals (Current goals can be found in the Care Plan section)  Acute Rehab PT Goals Patient Stated Goal: make sure he fully recovers PT Goal Formulation: With patient/family Time For Goal Achievement: 01/18/21 Potential to Achieve Goals: Good    Frequency Min 3X/week   Barriers to discharge        Co-evaluation               AM-PAC PT "6 Clicks" Mobility  Outcome Measure Help needed turning from your back to your side while in a flat bed without using bedrails?: None Help needed moving from lying on your back to sitting on the side of a flat bed without using bedrails?: None Help needed moving to and from a bed to a chair (including a wheelchair)?: A Little Help needed standing up from a chair using your arms (e.g., wheelchair or bedside chair)?: A Little Help  needed to walk in hospital room?: A Little Help needed climbing 3-5 steps with a railing? : A Little 6 Click Score: 20    End of Session   Activity Tolerance: Patient tolerated treatment well Patient left: in chair;with call bell/phone within reach;with family/visitor present Nurse Communication: Mobility status PT Visit Diagnosis: Unsteadiness on feet (R26.81);Other symptoms and signs involving the nervous system (R29.898);Dizziness and giddiness (R42);Difficulty in walking, not elsewhere classified (R26.2)    Time: 0518-3358 PT Time Calculation (min) (ACUTE ONLY): 30 min   Charges:   PT Evaluation $PT Eval Moderate Complexity: 1 Mod PT Treatments $Gait Training: 8-22 mins        Windell Norfolk, DPT, PN1   Supplemental Physical Therapist [place]    Pager [phone] Acute Rehab Office [phone]

## 2021-01-05 ENCOUNTER — Other Ambulatory Visit (HOSPITAL_COMMUNITY): Payer: Self-pay

## 2021-01-05 ENCOUNTER — Inpatient Hospital Stay (HOSPITAL_COMMUNITY): Payer: 59

## 2021-01-05 DIAGNOSIS — I639 Cerebral infarction, unspecified: Secondary | ICD-10-CM

## 2021-01-05 LAB — FERRITIN: Ferritin: 281 ng/mL (ref 24–336)

## 2021-01-05 LAB — COMPREHENSIVE METABOLIC PANEL
ALT: 30 U/L (ref 0–44)
AST: 29 U/L (ref 15–41)
Albumin: 3.4 g/dL — ABNORMAL LOW (ref 3.5–5.0)
Alkaline Phosphatase: 77 U/L (ref 38–126)
Anion gap: 6 (ref 5–15)
BUN: 15 mg/dL (ref 6–20)
CO2: 29 mmol/L (ref 22–32)
Calcium: 9.3 mg/dL (ref 8.9–10.3)
Chloride: 103 mmol/L (ref 98–111)
Creatinine, Ser: 1.69 mg/dL — ABNORMAL HIGH (ref 0.61–1.24)
GFR, Estimated: 49 mL/min — ABNORMAL LOW (ref >60)
Glucose, Bld: 103 mg/dL — ABNORMAL HIGH (ref 70–99)
Potassium: 4.3 mmol/L (ref 3.5–5.1)
Sodium: 138 mmol/L (ref 135–145)
Total Bilirubin: 0.6 mg/dL (ref 0.3–1.2)
Total Protein: 6.8 g/dL (ref 6.5–8.1)

## 2021-01-05 LAB — CBC WITH DIFFERENTIAL/PLATELET
Abs Immature Granulocytes: 0 10*3/uL (ref 0.00–0.07)
Basophils Absolute: 0.1 10*3/uL (ref 0.0–0.1)
Basophils Relative: 1 %
Eosinophils Absolute: 0.3 10*3/uL (ref 0.0–0.5)
Eosinophils Relative: 4 %
HCT: 45.1 % (ref 39.0–52.0)
Hemoglobin: 15.5 g/dL (ref 13.0–17.0)
Lymphocytes Relative: 45 %
Lymphs Abs: 3.4 10*3/uL (ref 0.7–4.0)
MCH: 33 pg (ref 26.0–34.0)
MCHC: 34.4 g/dL (ref 30.0–36.0)
MCV: 96.2 fL (ref 80.0–100.0)
Monocytes Absolute: 0.6 10*3/uL (ref 0.1–1.0)
Monocytes Relative: 8 %
Neutro Abs: 3.2 10*3/uL (ref 1.7–7.7)
Neutrophils Relative %: 42 %
Platelets: 181 10*3/uL (ref 150–400)
RBC: 4.69 MIL/uL (ref 4.22–5.81)
RDW: 13.3 % (ref 11.5–15.5)
WBC: 7.5 10*3/uL (ref 4.0–10.5)
nRBC: 0 % (ref 0.0–0.2)
nRBC: 0 /100 WBC

## 2021-01-05 LAB — D-DIMER, QUANTITATIVE: D-Dimer, Quant: 0.29 ug/mL-FEU (ref 0.00–0.50)

## 2021-01-05 LAB — HEMOGLOBIN A1C
Hgb A1c MFr Bld: 5.4 % (ref 4.8–5.6)
Mean Plasma Glucose: 108 mg/dL

## 2021-01-05 LAB — C-REACTIVE PROTEIN: CRP: 1 mg/dL — ABNORMAL HIGH (ref <1.0)

## 2021-01-05 MED ORDER — CLOPIDOGREL BISULFATE 75 MG PO TABS
75.0000 mg | ORAL_TABLET | Freq: Every day | ORAL | 0 refills | Status: DC
  Filled 2021-01-05: qty 30, 30d supply, fill #0

## 2021-01-05 MED ORDER — ATORVASTATIN CALCIUM 80 MG PO TABS
80.0000 mg | ORAL_TABLET | Freq: Every day | ORAL | 0 refills | Status: DC
  Filled 2021-01-05: qty 30, 30d supply, fill #0

## 2021-01-05 MED ORDER — AMLODIPINE BESYLATE 5 MG PO TABS
5.0000 mg | ORAL_TABLET | Freq: Every day | ORAL | Status: DC
  Administered 2021-01-05: 5 mg via ORAL
  Filled 2021-01-05: qty 1

## 2021-01-05 NOTE — Progress Notes (Signed)
CSW notified by OT that saw patient today that he was concerned about getting to outpatient therapy as his wife doesn't drive. CSW sent enrollment form into [facility] [employer], they will follow up with the patient. CSW spoke with patient via phone to update him on process for scheduling transportation for outpatient therapy appointments.   Laveda Abbe, [place] Clinical Social Worker [phone]

## 2021-01-05 NOTE — Progress Notes (Signed)
Patient was discharged from [phone]. All belongings were taken. IV and tele D/C'd. Skin intact. Vitals stable. On RA. TOC meds and rolling walker delivered to room. AVS reviewed with patient and his partner. All questions answered. Patient was taken to front entrance to meet his Lyft ride.

## 2021-01-05 NOTE — Progress Notes (Signed)
Bilateral lower extremity venous duplex completed. Refer to "CV Proc" under chart review to view preliminary results.  01/05/2021 11:04 AM Kelby Aline., MHA, RVT, RDCS, RDMS

## 2021-01-05 NOTE — Discharge Instructions (Signed)
Person Under Monitoring Name: Drew Cuevas  Location: [place] [address]   Infection Prevention Recommendations for Individuals Confirmed to have, or Being Evaluated for, 2019 Novel Coronavirus (COVID-19) Infection Who Receive Care at Home  Individuals who are confirmed to have, or are being evaluated for, COVID-19 should follow the prevention steps below until a healthcare provider or local or state health department says they can return to normal activities.  Stay home except to get medical care You should restrict activities outside your home, except for getting medical care. Do not go to work, school, or public areas, and do not use public transportation or taxis.  Call ahead before visiting your doctor Before your medical appointment, call the healthcare provider and tell them that you have, or are being evaluated for, COVID-19 infection. This will help the healthcare provider's office take steps to keep other people from getting infected. Ask your healthcare provider to call the local or state health department.  Monitor your symptoms Seek prompt medical attention if your illness is worsening (e.g., difficulty breathing). Before going to your medical appointment, call the healthcare provider and tell them that you have, or are being evaluated for, COVID-19 infection. Ask your healthcare provider to call the local or state health department.  Wear a facemask You should wear a facemask that covers your nose and mouth when you are in the same room with other people and when you visit a healthcare provider. People who live with or visit you should also wear a facemask while they are in the same room with you.  Separate yourself from other people in your home As much as possible, you should stay in a different room from other people in your home. Also, you should use a separate bathroom, if available.  Avoid sharing household items You should  not share dishes, drinking glasses, cups, eating utensils, towels, bedding, or other items with other people in your home. After using these items, you should wash them thoroughly with soap and water.  Cover your coughs and sneezes Cover your mouth and nose with a tissue when you cough or sneeze, or you can cough or sneeze into your sleeve. Throw used tissues in a lined trash can, and immediately wash your hands with soap and water for at least 20 seconds or use an alcohol-based hand rub.  Wash your [facility] your hands often and thoroughly with soap and water for at least 20 seconds. You can use an alcohol-based hand sanitizer if soap and water are not available and if your hands are not visibly dirty. Avoid touching your eyes, nose, and mouth with unwashed hands.   Prevention Steps for Caregivers and Household Members of Individuals Confirmed to have, or Being Evaluated for, COVID-19 Infection Being Cared for in the Home  If you live with, or provide care at home for, a person confirmed to have, or being evaluated for, COVID-19 infection please follow these guidelines to prevent infection:  Follow healthcare provider's instructions Make sure that you understand and can help the patient follow any healthcare provider instructions for all care.  Provide for the patient's basic needs You should help the patient with basic needs in the home and provide support for getting groceries, prescriptions, and other personal needs.  Monitor the patient's symptoms If they are getting sicker, call his or her medical provider and tell them that the patient has, or is being evaluated for, COVID-19 infection. This will help the healthcare  provider's office take steps to keep other people from getting infected. Ask the healthcare provider to call the local or state health department.  Limit the number of people who have contact with the patient If possible, have only one caregiver for the  patient. Other household members should stay in another home or place of residence. If this is not possible, they should stay in another room, or be separated from the patient as much as possible. Use a separate bathroom, if available. Restrict visitors who do not have an essential need to be in the home.  Keep older adults, very young children, and other sick people away from the patient Keep older adults, very young children, and those who have compromised immune systems or chronic health conditions away from the patient. This includes people with chronic heart, lung, or kidney conditions, diabetes, and cancer.  Ensure good ventilation Make sure that shared spaces in the home have good air flow, such as from an air conditioner or an opened window, weather permitting.  Wash your hands often Wash your hands often and thoroughly with soap and water for at least 20 seconds. You can use an alcohol based hand sanitizer if soap and water are not available and if your hands are not visibly dirty. Avoid touching your eyes, nose, and mouth with unwashed hands. Use disposable paper towels to dry your hands. If not available, use dedicated cloth towels and replace them when they become wet.  Wear a facemask and gloves Wear a disposable facemask at all times in the room and gloves when you touch or have contact with the patient's blood, body fluids, and/or secretions or excretions, such as sweat, saliva, sputum, nasal mucus, vomit, urine, or feces.  Ensure the mask fits over your nose and mouth tightly, and do not touch it during use. Throw out disposable facemasks and gloves after using them. Do not reuse. Wash your hands immediately after removing your facemask and gloves. If your personal clothing becomes contaminated, carefully remove clothing and launder. Wash your hands after handling contaminated clothing. Place all used disposable facemasks, gloves, and other waste in a lined container before  disposing them with other household waste. Remove gloves and wash your hands immediately after handling these items.  Do not share dishes, glasses, or other household items with the patient Avoid sharing household items. You should not share dishes, drinking glasses, cups, eating utensils, towels, bedding, or other items with a patient who is confirmed to have, or being evaluated for, COVID-19 infection. After the person uses these items, you should wash them thoroughly with soap and water.  Wash laundry thoroughly Immediately remove and wash clothes or bedding that have blood, body fluids, and/or secretions or excretions, such as sweat, saliva, sputum, nasal mucus, vomit, urine, or feces, on them. Wear gloves when handling laundry from the patient. Read and follow directions on labels of laundry or clothing items and detergent. In general, wash and dry with the warmest temperatures recommended on the label.  Clean all areas the individual has used often Clean all touchable surfaces, such as counters, tabletops, doorknobs, bathroom fixtures, toilets, phones, keyboards, tablets, and bedside tables, every day. Also, clean any surfaces that may have blood, body fluids, and/or secretions or excretions on them. Wear gloves when cleaning surfaces the patient has come in contact with. Use a diluted bleach solution (e.g., dilute bleach with 1 part bleach and 10 parts water) or a household disinfectant with a label that says EPA-registered for coronaviruses. To make  a bleach solution at home, add 1 tablespoon of bleach to 1 quart (4 cups) of water. For a larger supply, add  cup of bleach to 1 gallon (16 cups) of water. Read labels of cleaning products and follow recommendations provided on product labels. Labels contain instructions for safe and effective use of the cleaning product including precautions you should take when applying the product, such as wearing gloves or eye protection and making sure you  have good ventilation during use of the product. Remove gloves and wash hands immediately after cleaning.  Monitor yourself for signs and symptoms of illness Caregivers and household members are considered close contacts, should monitor their health, and will be asked to limit movement outside of the home to the extent possible. Follow the monitoring steps for close contacts listed on the symptom monitoring form.   ? If you have additional questions, contact your local health department or call the epidemiologist on call at [phone] (available 24/7). ? This guidance is subject to change. For the most up-to-date guidance from [facility], please refer to their website: [email or web address]

## 2021-01-05 NOTE — Discharge Summary (Signed)
FV Mid   Full                                                        +---------+---------------+---------+-----------+----------+--------------+ FV DistalFull                                                        +---------+---------------+---------+-----------+----------+--------------+ PFV      Full                                                        +---------+---------------+---------+-----------+----------+--------------+ POP      Full           Yes      Yes                                  +---------+---------------+---------+-----------+----------+--------------+ PTV      Full                                                        +---------+---------------+---------+-----------+----------+--------------+ PERO     Full                                                        +---------+---------------+---------+-----------+----------+--------------+     Summary: RIGHT: - There is no evidence of deep vein thrombosis in the lower extremity.  - No cystic structure found in the popliteal fossa.  LEFT: - There is no evidence of deep vein thrombosis in the lower extremity.  - No cystic structure found in the popliteal fossa.  *See table(s) above for measurements and observations.    Preliminary    ECHOCARDIOGRAM LIMITED  Result Date: 01/04/2021    ECHOCARDIOGRAM LIMITED REPORT   Patient Name:   Drew Cuevas Date of Exam: 01/04/2021 Medical Rec #:  [id number]    Height:       66.0 in Accession #:    [id number]   Weight:       233.0 lb Date of Birth:  Oct 01, 1970   BSA:          2.134 m Patient Age:    50 years     BP:           153/82 mmHg Patient Gender: M            HR:           95 bpm. Exam Location:  Inpatient Procedure: Limited Echo, Color Doppler and Cardiac Doppler Indications:    Stroke i63.9  History:        Patient has no prior history of Echocardiogram examinations.  FV Mid   Full                                                        +---------+---------------+---------+-----------+----------+--------------+ FV DistalFull                                                        +---------+---------------+---------+-----------+----------+--------------+ PFV      Full                                                        +---------+---------------+---------+-----------+----------+--------------+ POP      Full           Yes      Yes                                  +---------+---------------+---------+-----------+----------+--------------+ PTV      Full                                                        +---------+---------------+---------+-----------+----------+--------------+ PERO     Full                                                        +---------+---------------+---------+-----------+----------+--------------+     Summary: RIGHT: - There is no evidence of deep vein thrombosis in the lower extremity.  - No cystic structure found in the popliteal fossa.  LEFT: - There is no evidence of deep vein thrombosis in the lower extremity.  - No cystic structure found in the popliteal fossa.  *See table(s) above for measurements and observations.    Preliminary    ECHOCARDIOGRAM LIMITED  Result Date: 01/04/2021    ECHOCARDIOGRAM LIMITED REPORT   Patient Name:   Drew Cuevas Date of Exam: 01/04/2021 Medical Rec #:  [id number]    Height:       66.0 in Accession #:    [id number]   Weight:       233.0 lb Date of Birth:  Oct 01, 1970   BSA:          2.134 m Patient Age:    50 years     BP:           153/82 mmHg Patient Gender: M            HR:           95 bpm. Exam Location:  Inpatient Procedure: Limited Echo, Color Doppler and Cardiac Doppler Indications:    Stroke i63.9  History:        Patient has no prior history of Echocardiogram examinations.  Summary: RIGHT: - There is no evidence of deep vein thrombosis in the lower extremity.  - No cystic structure found in the popliteal fossa.  LEFT: - There is no evidence of deep vein thrombosis in the lower extremity.  - No cystic structure found in the popliteal fossa.  *See table(s) above for measurements and observations.    Preliminary    ECHOCARDIOGRAM LIMITED  Result Date: 01/04/2021    ECHOCARDIOGRAM LIMITED  REPORT   Patient Name:   Drew Cuevas Date of Exam: 01/04/2021 Medical Rec #:  [id number]    Height:       66.0 in Accession #:    [id number]   Weight:       233.0 lb Date of Birth:  1971/07/08   BSA:          2.134 m Patient Age:    50 years     BP:           153/82 mmHg Patient Gender: M            HR:           95 bpm. Exam Location:  Inpatient Procedure: Limited Echo, Color Doppler and Cardiac Doppler Indications:    Stroke i63.9  History:        Patient has no prior history of Echocardiogram examinations.                 Risk Factors:Hypertension and Dyslipidemia.  Sonographer:    Raquel Sarna Senior RDCS Referring Phys: [id number] [facility]  Sonographer Comments: COVID+ at time of study IMPRESSIONS  1. Left ventricular ejection fraction, by estimation, is 60 to 65%. The left ventricle has normal function. The left ventricle has no regional wall motion abnormalities.  2. Right ventricular systolic function is normal. The right ventricular size is normal.  3. The mitral valve is normal in structure. Trivial mitral valve regurgitation. No evidence of mitral stenosis.  4. The aortic valve is normal in structure. Aortic valve regurgitation is not visualized. No aortic stenosis is present.  5. The inferior vena cava is normal in size with greater than 50% respiratory variability, suggesting right atrial pressure of 3 mmHg. Conclusion(s)/Recommendation(s): No intracardiac source of embolism detected on this transthoracic study. A transesophageal echocardiogram is recommended to exclude cardiac source of embolism if clinically indicated. FINDINGS  Left Ventricle: Left ventricular ejection fraction, by estimation, is 60 to 65%. The left ventricle has normal function. The left ventricle has no regional wall motion abnormalities. The left ventricular internal cavity size was normal in size. There is  no left ventricular hypertrophy. Right Ventricle: The right ventricular size is normal. No increase in right ventricular wall  thickness. Right ventricular systolic function is normal. Left Atrium: Left atrial size was normal in size. Right Atrium: Right atrial size was normal in size. Pericardium: There is no evidence of pericardial effusion. Mitral Valve: The mitral valve is normal in structure. Trivial mitral valve regurgitation. No evidence of mitral valve stenosis. Tricuspid Valve: The tricuspid valve is normal in structure. Tricuspid valve regurgitation is not demonstrated. No evidence of tricuspid stenosis. Aortic Valve: The aortic valve is normal in structure. Aortic valve regurgitation is not visualized. No aortic stenosis is present. Pulmonic Valve: The pulmonic valve was normal in structure. Pulmonic valve regurgitation is not visualized. No evidence of pulmonic stenosis. Aorta: The aortic root is normal in size and structure. Venous: The inferior vena cava is normal in size with greater than 50% respiratory variability, suggesting right atrial pressure  Summary: RIGHT: - There is no evidence of deep vein thrombosis in the lower extremity.  - No cystic structure found in the popliteal fossa.  LEFT: - There is no evidence of deep vein thrombosis in the lower extremity.  - No cystic structure found in the popliteal fossa.  *See table(s) above for measurements and observations.    Preliminary    ECHOCARDIOGRAM LIMITED  Result Date: 01/04/2021    ECHOCARDIOGRAM LIMITED  REPORT   Patient Name:   Drew Cuevas Date of Exam: 01/04/2021 Medical Rec #:  [id number]    Height:       66.0 in Accession #:    [id number]   Weight:       233.0 lb Date of Birth:  1971/07/08   BSA:          2.134 m Patient Age:    50 years     BP:           153/82 mmHg Patient Gender: M            HR:           95 bpm. Exam Location:  Inpatient Procedure: Limited Echo, Color Doppler and Cardiac Doppler Indications:    Stroke i63.9  History:        Patient has no prior history of Echocardiogram examinations.                 Risk Factors:Hypertension and Dyslipidemia.  Sonographer:    Raquel Sarna Senior RDCS Referring Phys: [id number] [facility]  Sonographer Comments: COVID+ at time of study IMPRESSIONS  1. Left ventricular ejection fraction, by estimation, is 60 to 65%. The left ventricle has normal function. The left ventricle has no regional wall motion abnormalities.  2. Right ventricular systolic function is normal. The right ventricular size is normal.  3. The mitral valve is normal in structure. Trivial mitral valve regurgitation. No evidence of mitral stenosis.  4. The aortic valve is normal in structure. Aortic valve regurgitation is not visualized. No aortic stenosis is present.  5. The inferior vena cava is normal in size with greater than 50% respiratory variability, suggesting right atrial pressure of 3 mmHg. Conclusion(s)/Recommendation(s): No intracardiac source of embolism detected on this transthoracic study. A transesophageal echocardiogram is recommended to exclude cardiac source of embolism if clinically indicated. FINDINGS  Left Ventricle: Left ventricular ejection fraction, by estimation, is 60 to 65%. The left ventricle has normal function. The left ventricle has no regional wall motion abnormalities. The left ventricular internal cavity size was normal in size. There is  no left ventricular hypertrophy. Right Ventricle: The right ventricular size is normal. No increase in right ventricular wall  thickness. Right ventricular systolic function is normal. Left Atrium: Left atrial size was normal in size. Right Atrium: Right atrial size was normal in size. Pericardium: There is no evidence of pericardial effusion. Mitral Valve: The mitral valve is normal in structure. Trivial mitral valve regurgitation. No evidence of mitral valve stenosis. Tricuspid Valve: The tricuspid valve is normal in structure. Tricuspid valve regurgitation is not demonstrated. No evidence of tricuspid stenosis. Aortic Valve: The aortic valve is normal in structure. Aortic valve regurgitation is not visualized. No aortic stenosis is present. Pulmonic Valve: The pulmonic valve was normal in structure. Pulmonic valve regurgitation is not visualized. No evidence of pulmonic stenosis. Aorta: The aortic root is normal in size and structure. Venous: The inferior vena cava is normal in size with greater than 50% respiratory variability, suggesting right atrial pressure  PATIENT DETAILS Name: Drew Cuevas Age: 50 y.o. Sex: male Date of Birth: 1971/06/29 MRN: [id number]. Admitting Physician: Harold Hedge, MD LHT:DSKAJGO, Dalbert Batman, MD  Admit Date: 01/02/2021 Discharge date: 01/05/2021  Recommendations for Outpatient Follow-up:  Follow up with PCP in 1-2 weeks Please obtain CMP/CBC in one week Please ensure follow-up with the stroke clinic.  Admitted From:  Home  Disposition: [place]: No  Equipment/Devices: None  Discharge Condition: Stable  CODE STATUS: FULL CODE  Diet recommendation:  Diet Order             Diet - low sodium heart healthy           Diet Heart Room service appropriate? Yes; Fluid consistency: Thin  Diet effective now                   Brief Narrative: Patient is a 50 y.o. male with PMHx of HIV, HTN, HLD, CKD stage IIIa, right kidney RCC-presented with 3-4-day history of vertigo, vomiting, difficulty ambulating-was found to have acute CVA and COVID-19 infection.  He was subsequently admitted to the hospitalist service for further evaluation and treatment.   COVID-19 vaccinated status: Vaccinated including booster.   Significant Events: 6/6>> Admit to [facility] for CVA   Significant studies: 6/7>> MRI brain: Acute left superior cerebral artery territory infarct, small infarct in the right cerebellum and left upper pons, small subacute infarct in the high right frontal cortex 6/7>> Brain MRA: Negative for significant stenosis 6/7>> Neck MRA: Negative for significant stenosis 6/8>> Echo: EF 60-65% 6/8>> UDS: Positive for opiates/cannabinoids 6/8>> LDL: 134 6/8>> A1c 5.4 6/9>> bilateral lower extremity Dopplers: Negative for DVT.   COVID-19 medications: Remdesivir: 6/7>> 6/9   Antibiotics: None   Microbiology data: 6/7>> COVID PCR: Positive   Procedures: None   Consults: Neurology  Brief Hospital Course: Acute CVA: Likely embolic-no evidence of A. fib on telemetry/EKG-could be due to  hypercoagulable state from [place].  Work-up as above-neurology recommending to continue Plavix and high intensity statin on discharge.  Discussed with neurology-Dr. Ardeen Jourdain does not recommend outpatient 30-day event monitor-he suspects that this embolic CVA was more likely due to hypercoagulable state from [place].   COVID-19 infection: No major respiratory symptoms-continue Remdesivir x3 days.   Headache: Patient-this is very typical of his migraine headaches-improved with just Tylenol.   Hypokalemia: Repleted   HTN: Initially permissive hypertension was allowed-amlodipine has been subsequently resumed.   HIV: Continue ART   Anxiety/depression: Continue Lexapro/trazodone   CKD stage IIIa: At baseline   Tobacco abuse: Counseled regarding cessation.   Obesity: Estimated body mass index is 37.61 kg/m as calculated from the following:   Height as of this encounter: 5\' 6"  (1.676 m).   Weight as of this encounter: 105.7 kg.     Procedures None  Discharge Diagnoses:  Principal Problem:   Cerebellar stroke ([place]) Active Problems:   Human immunodeficiency virus (HIV) disease ([place])   Hypertension   Dyslipidemia   Tobacco dependence   Chronic kidney disease, stage 3a ([place])   Depression   COVID-19 virus infection   Discharge Instructions:  Activity:  As tolerated   Discharge Instructions     Ambulatory referral to Neurology   Complete by: As directed    Call MD for:  extreme fatigue   Complete by: As directed    Call MD for:  persistant dizziness or light-headedness   Complete by: As directed    Diet - low sodium heart healthy  Summary: RIGHT: - There is no evidence of deep vein thrombosis in the lower extremity.  - No cystic structure found in the popliteal fossa.  LEFT: - There is no evidence of deep vein thrombosis in the lower extremity.  - No cystic structure found in the popliteal fossa.  *See table(s) above for measurements and observations.    Preliminary    ECHOCARDIOGRAM LIMITED  Result Date: 01/04/2021    ECHOCARDIOGRAM LIMITED  REPORT   Patient Name:   Drew Cuevas Date of Exam: 01/04/2021 Medical Rec #:  [id number]    Height:       66.0 in Accession #:    [id number]   Weight:       233.0 lb Date of Birth:  1971/07/08   BSA:          2.134 m Patient Age:    50 years     BP:           153/82 mmHg Patient Gender: M            HR:           95 bpm. Exam Location:  Inpatient Procedure: Limited Echo, Color Doppler and Cardiac Doppler Indications:    Stroke i63.9  History:        Patient has no prior history of Echocardiogram examinations.                 Risk Factors:Hypertension and Dyslipidemia.  Sonographer:    Raquel Sarna Senior RDCS Referring Phys: [id number] [facility]  Sonographer Comments: COVID+ at time of study IMPRESSIONS  1. Left ventricular ejection fraction, by estimation, is 60 to 65%. The left ventricle has normal function. The left ventricle has no regional wall motion abnormalities.  2. Right ventricular systolic function is normal. The right ventricular size is normal.  3. The mitral valve is normal in structure. Trivial mitral valve regurgitation. No evidence of mitral stenosis.  4. The aortic valve is normal in structure. Aortic valve regurgitation is not visualized. No aortic stenosis is present.  5. The inferior vena cava is normal in size with greater than 50% respiratory variability, suggesting right atrial pressure of 3 mmHg. Conclusion(s)/Recommendation(s): No intracardiac source of embolism detected on this transthoracic study. A transesophageal echocardiogram is recommended to exclude cardiac source of embolism if clinically indicated. FINDINGS  Left Ventricle: Left ventricular ejection fraction, by estimation, is 60 to 65%. The left ventricle has normal function. The left ventricle has no regional wall motion abnormalities. The left ventricular internal cavity size was normal in size. There is  no left ventricular hypertrophy. Right Ventricle: The right ventricular size is normal. No increase in right ventricular wall  thickness. Right ventricular systolic function is normal. Left Atrium: Left atrial size was normal in size. Right Atrium: Right atrial size was normal in size. Pericardium: There is no evidence of pericardial effusion. Mitral Valve: The mitral valve is normal in structure. Trivial mitral valve regurgitation. No evidence of mitral valve stenosis. Tricuspid Valve: The tricuspid valve is normal in structure. Tricuspid valve regurgitation is not demonstrated. No evidence of tricuspid stenosis. Aortic Valve: The aortic valve is normal in structure. Aortic valve regurgitation is not visualized. No aortic stenosis is present. Pulmonic Valve: The pulmonic valve was normal in structure. Pulmonic valve regurgitation is not visualized. No evidence of pulmonic stenosis. Aorta: The aortic root is normal in size and structure. Venous: The inferior vena cava is normal in size with greater than 50% respiratory variability, suggesting right atrial pressure  Summary: RIGHT: - There is no evidence of deep vein thrombosis in the lower extremity.  - No cystic structure found in the popliteal fossa.  LEFT: - There is no evidence of deep vein thrombosis in the lower extremity.  - No cystic structure found in the popliteal fossa.  *See table(s) above for measurements and observations.    Preliminary    ECHOCARDIOGRAM LIMITED  Result Date: 01/04/2021    ECHOCARDIOGRAM LIMITED  REPORT   Patient Name:   Drew Cuevas Date of Exam: 01/04/2021 Medical Rec #:  [id number]    Height:       66.0 in Accession #:    [id number]   Weight:       233.0 lb Date of Birth:  1971/07/08   BSA:          2.134 m Patient Age:    50 years     BP:           153/82 mmHg Patient Gender: M            HR:           95 bpm. Exam Location:  Inpatient Procedure: Limited Echo, Color Doppler and Cardiac Doppler Indications:    Stroke i63.9  History:        Patient has no prior history of Echocardiogram examinations.                 Risk Factors:Hypertension and Dyslipidemia.  Sonographer:    Raquel Sarna Senior RDCS Referring Phys: [id number] [facility]  Sonographer Comments: COVID+ at time of study IMPRESSIONS  1. Left ventricular ejection fraction, by estimation, is 60 to 65%. The left ventricle has normal function. The left ventricle has no regional wall motion abnormalities.  2. Right ventricular systolic function is normal. The right ventricular size is normal.  3. The mitral valve is normal in structure. Trivial mitral valve regurgitation. No evidence of mitral stenosis.  4. The aortic valve is normal in structure. Aortic valve regurgitation is not visualized. No aortic stenosis is present.  5. The inferior vena cava is normal in size with greater than 50% respiratory variability, suggesting right atrial pressure of 3 mmHg. Conclusion(s)/Recommendation(s): No intracardiac source of embolism detected on this transthoracic study. A transesophageal echocardiogram is recommended to exclude cardiac source of embolism if clinically indicated. FINDINGS  Left Ventricle: Left ventricular ejection fraction, by estimation, is 60 to 65%. The left ventricle has normal function. The left ventricle has no regional wall motion abnormalities. The left ventricular internal cavity size was normal in size. There is  no left ventricular hypertrophy. Right Ventricle: The right ventricular size is normal. No increase in right ventricular wall  thickness. Right ventricular systolic function is normal. Left Atrium: Left atrial size was normal in size. Right Atrium: Right atrial size was normal in size. Pericardium: There is no evidence of pericardial effusion. Mitral Valve: The mitral valve is normal in structure. Trivial mitral valve regurgitation. No evidence of mitral valve stenosis. Tricuspid Valve: The tricuspid valve is normal in structure. Tricuspid valve regurgitation is not demonstrated. No evidence of tricuspid stenosis. Aortic Valve: The aortic valve is normal in structure. Aortic valve regurgitation is not visualized. No aortic stenosis is present. Pulmonic Valve: The pulmonic valve was normal in structure. Pulmonic valve regurgitation is not visualized. No evidence of pulmonic stenosis. Aorta: The aortic root is normal in size and structure. Venous: The inferior vena cava is normal in size with greater than 50% respiratory variability, suggesting right atrial pressure  PATIENT DETAILS Name: Drew Cuevas Age: 50 y.o. Sex: male Date of Birth: 1971/06/29 MRN: [id number]. Admitting Physician: Harold Hedge, MD LHT:DSKAJGO, Dalbert Batman, MD  Admit Date: 01/02/2021 Discharge date: 01/05/2021  Recommendations for Outpatient Follow-up:  Follow up with PCP in 1-2 weeks Please obtain CMP/CBC in one week Please ensure follow-up with the stroke clinic.  Admitted From:  Home  Disposition: [place]: No  Equipment/Devices: None  Discharge Condition: Stable  CODE STATUS: FULL CODE  Diet recommendation:  Diet Order             Diet - low sodium heart healthy           Diet Heart Room service appropriate? Yes; Fluid consistency: Thin  Diet effective now                   Brief Narrative: Patient is a 50 y.o. male with PMHx of HIV, HTN, HLD, CKD stage IIIa, right kidney RCC-presented with 3-4-day history of vertigo, vomiting, difficulty ambulating-was found to have acute CVA and COVID-19 infection.  He was subsequently admitted to the hospitalist service for further evaluation and treatment.   COVID-19 vaccinated status: Vaccinated including booster.   Significant Events: 6/6>> Admit to [facility] for CVA   Significant studies: 6/7>> MRI brain: Acute left superior cerebral artery territory infarct, small infarct in the right cerebellum and left upper pons, small subacute infarct in the high right frontal cortex 6/7>> Brain MRA: Negative for significant stenosis 6/7>> Neck MRA: Negative for significant stenosis 6/8>> Echo: EF 60-65% 6/8>> UDS: Positive for opiates/cannabinoids 6/8>> LDL: 134 6/8>> A1c 5.4 6/9>> bilateral lower extremity Dopplers: Negative for DVT.   COVID-19 medications: Remdesivir: 6/7>> 6/9   Antibiotics: None   Microbiology data: 6/7>> COVID PCR: Positive   Procedures: None   Consults: Neurology  Brief Hospital Course: Acute CVA: Likely embolic-no evidence of A. fib on telemetry/EKG-could be due to  hypercoagulable state from [place].  Work-up as above-neurology recommending to continue Plavix and high intensity statin on discharge.  Discussed with neurology-Dr. Ardeen Jourdain does not recommend outpatient 30-day event monitor-he suspects that this embolic CVA was more likely due to hypercoagulable state from [place].   COVID-19 infection: No major respiratory symptoms-continue Remdesivir x3 days.   Headache: Patient-this is very typical of his migraine headaches-improved with just Tylenol.   Hypokalemia: Repleted   HTN: Initially permissive hypertension was allowed-amlodipine has been subsequently resumed.   HIV: Continue ART   Anxiety/depression: Continue Lexapro/trazodone   CKD stage IIIa: At baseline   Tobacco abuse: Counseled regarding cessation.   Obesity: Estimated body mass index is 37.61 kg/m as calculated from the following:   Height as of this encounter: 5\' 6"  (1.676 m).   Weight as of this encounter: 105.7 kg.     Procedures None  Discharge Diagnoses:  Principal Problem:   Cerebellar stroke ([place]) Active Problems:   Human immunodeficiency virus (HIV) disease ([place])   Hypertension   Dyslipidemia   Tobacco dependence   Chronic kidney disease, stage 3a ([place])   Depression   COVID-19 virus infection   Discharge Instructions:  Activity:  As tolerated   Discharge Instructions     Ambulatory referral to Neurology   Complete by: As directed    Call MD for:  extreme fatigue   Complete by: As directed    Call MD for:  persistant dizziness or light-headedness   Complete by: As directed    Diet - low sodium heart healthy  Summary: RIGHT: - There is no evidence of deep vein thrombosis in the lower extremity.  - No cystic structure found in the popliteal fossa.  LEFT: - There is no evidence of deep vein thrombosis in the lower extremity.  - No cystic structure found in the popliteal fossa.  *See table(s) above for measurements and observations.    Preliminary    ECHOCARDIOGRAM LIMITED  Result Date: 01/04/2021    ECHOCARDIOGRAM LIMITED  REPORT   Patient Name:   Drew Cuevas Date of Exam: 01/04/2021 Medical Rec #:  [id number]    Height:       66.0 in Accession #:    [id number]   Weight:       233.0 lb Date of Birth:  1971/07/08   BSA:          2.134 m Patient Age:    50 years     BP:           153/82 mmHg Patient Gender: M            HR:           95 bpm. Exam Location:  Inpatient Procedure: Limited Echo, Color Doppler and Cardiac Doppler Indications:    Stroke i63.9  History:        Patient has no prior history of Echocardiogram examinations.                 Risk Factors:Hypertension and Dyslipidemia.  Sonographer:    Raquel Sarna Senior RDCS Referring Phys: [id number] [facility]  Sonographer Comments: COVID+ at time of study IMPRESSIONS  1. Left ventricular ejection fraction, by estimation, is 60 to 65%. The left ventricle has normal function. The left ventricle has no regional wall motion abnormalities.  2. Right ventricular systolic function is normal. The right ventricular size is normal.  3. The mitral valve is normal in structure. Trivial mitral valve regurgitation. No evidence of mitral stenosis.  4. The aortic valve is normal in structure. Aortic valve regurgitation is not visualized. No aortic stenosis is present.  5. The inferior vena cava is normal in size with greater than 50% respiratory variability, suggesting right atrial pressure of 3 mmHg. Conclusion(s)/Recommendation(s): No intracardiac source of embolism detected on this transthoracic study. A transesophageal echocardiogram is recommended to exclude cardiac source of embolism if clinically indicated. FINDINGS  Left Ventricle: Left ventricular ejection fraction, by estimation, is 60 to 65%. The left ventricle has normal function. The left ventricle has no regional wall motion abnormalities. The left ventricular internal cavity size was normal in size. There is  no left ventricular hypertrophy. Right Ventricle: The right ventricular size is normal. No increase in right ventricular wall  thickness. Right ventricular systolic function is normal. Left Atrium: Left atrial size was normal in size. Right Atrium: Right atrial size was normal in size. Pericardium: There is no evidence of pericardial effusion. Mitral Valve: The mitral valve is normal in structure. Trivial mitral valve regurgitation. No evidence of mitral valve stenosis. Tricuspid Valve: The tricuspid valve is normal in structure. Tricuspid valve regurgitation is not demonstrated. No evidence of tricuspid stenosis. Aortic Valve: The aortic valve is normal in structure. Aortic valve regurgitation is not visualized. No aortic stenosis is present. Pulmonic Valve: The pulmonic valve was normal in structure. Pulmonic valve regurgitation is not visualized. No evidence of pulmonic stenosis. Aorta: The aortic root is normal in size and structure. Venous: The inferior vena cava is normal in size with greater than 50% respiratory variability, suggesting right atrial pressure  Summary: RIGHT: - There is no evidence of deep vein thrombosis in the lower extremity.  - No cystic structure found in the popliteal fossa.  LEFT: - There is no evidence of deep vein thrombosis in the lower extremity.  - No cystic structure found in the popliteal fossa.  *See table(s) above for measurements and observations.    Preliminary    ECHOCARDIOGRAM LIMITED  Result Date: 01/04/2021    ECHOCARDIOGRAM LIMITED  REPORT   Patient Name:   Drew Cuevas Date of Exam: 01/04/2021 Medical Rec #:  [id number]    Height:       66.0 in Accession #:    [id number]   Weight:       233.0 lb Date of Birth:  1971/07/08   BSA:          2.134 m Patient Age:    50 years     BP:           153/82 mmHg Patient Gender: M            HR:           95 bpm. Exam Location:  Inpatient Procedure: Limited Echo, Color Doppler and Cardiac Doppler Indications:    Stroke i63.9  History:        Patient has no prior history of Echocardiogram examinations.                 Risk Factors:Hypertension and Dyslipidemia.  Sonographer:    Raquel Sarna Senior RDCS Referring Phys: [id number] [facility]  Sonographer Comments: COVID+ at time of study IMPRESSIONS  1. Left ventricular ejection fraction, by estimation, is 60 to 65%. The left ventricle has normal function. The left ventricle has no regional wall motion abnormalities.  2. Right ventricular systolic function is normal. The right ventricular size is normal.  3. The mitral valve is normal in structure. Trivial mitral valve regurgitation. No evidence of mitral stenosis.  4. The aortic valve is normal in structure. Aortic valve regurgitation is not visualized. No aortic stenosis is present.  5. The inferior vena cava is normal in size with greater than 50% respiratory variability, suggesting right atrial pressure of 3 mmHg. Conclusion(s)/Recommendation(s): No intracardiac source of embolism detected on this transthoracic study. A transesophageal echocardiogram is recommended to exclude cardiac source of embolism if clinically indicated. FINDINGS  Left Ventricle: Left ventricular ejection fraction, by estimation, is 60 to 65%. The left ventricle has normal function. The left ventricle has no regional wall motion abnormalities. The left ventricular internal cavity size was normal in size. There is  no left ventricular hypertrophy. Right Ventricle: The right ventricular size is normal. No increase in right ventricular wall  thickness. Right ventricular systolic function is normal. Left Atrium: Left atrial size was normal in size. Right Atrium: Right atrial size was normal in size. Pericardium: There is no evidence of pericardial effusion. Mitral Valve: The mitral valve is normal in structure. Trivial mitral valve regurgitation. No evidence of mitral valve stenosis. Tricuspid Valve: The tricuspid valve is normal in structure. Tricuspid valve regurgitation is not demonstrated. No evidence of tricuspid stenosis. Aortic Valve: The aortic valve is normal in structure. Aortic valve regurgitation is not visualized. No aortic stenosis is present. Pulmonic Valve: The pulmonic valve was normal in structure. Pulmonic valve regurgitation is not visualized. No evidence of pulmonic stenosis. Aorta: The aortic root is normal in size and structure. Venous: The inferior vena cava is normal in size with greater than 50% respiratory variability, suggesting right atrial pressure  FV Mid   Full                                                        +---------+---------------+---------+-----------+----------+--------------+ FV DistalFull                                                        +---------+---------------+---------+-----------+----------+--------------+ PFV      Full                                                        +---------+---------------+---------+-----------+----------+--------------+ POP      Full           Yes      Yes                                  +---------+---------------+---------+-----------+----------+--------------+ PTV      Full                                                        +---------+---------------+---------+-----------+----------+--------------+ PERO     Full                                                        +---------+---------------+---------+-----------+----------+--------------+     Summary: RIGHT: - There is no evidence of deep vein thrombosis in the lower extremity.  - No cystic structure found in the popliteal fossa.  LEFT: - There is no evidence of deep vein thrombosis in the lower extremity.  - No cystic structure found in the popliteal fossa.  *See table(s) above for measurements and observations.    Preliminary    ECHOCARDIOGRAM LIMITED  Result Date: 01/04/2021    ECHOCARDIOGRAM LIMITED REPORT   Patient Name:   Drew Cuevas Date of Exam: 01/04/2021 Medical Rec #:  [id number]    Height:       66.0 in Accession #:    [id number]   Weight:       233.0 lb Date of Birth:  Oct 01, 1970   BSA:          2.134 m Patient Age:    50 years     BP:           153/82 mmHg Patient Gender: M            HR:           95 bpm. Exam Location:  Inpatient Procedure: Limited Echo, Color Doppler and Cardiac Doppler Indications:    Stroke i63.9  History:        Patient has no prior history of Echocardiogram examinations.  PATIENT DETAILS Name: Drew Cuevas Age: 50 y.o. Sex: male Date of Birth: 1971/06/29 MRN: [id number]. Admitting Physician: Harold Hedge, MD LHT:DSKAJGO, Dalbert Batman, MD  Admit Date: 01/02/2021 Discharge date: 01/05/2021  Recommendations for Outpatient Follow-up:  Follow up with PCP in 1-2 weeks Please obtain CMP/CBC in one week Please ensure follow-up with the stroke clinic.  Admitted From:  Home  Disposition: [place]: No  Equipment/Devices: None  Discharge Condition: Stable  CODE STATUS: FULL CODE  Diet recommendation:  Diet Order             Diet - low sodium heart healthy           Diet Heart Room service appropriate? Yes; Fluid consistency: Thin  Diet effective now                   Brief Narrative: Patient is a 50 y.o. male with PMHx of HIV, HTN, HLD, CKD stage IIIa, right kidney RCC-presented with 3-4-day history of vertigo, vomiting, difficulty ambulating-was found to have acute CVA and COVID-19 infection.  He was subsequently admitted to the hospitalist service for further evaluation and treatment.   COVID-19 vaccinated status: Vaccinated including booster.   Significant Events: 6/6>> Admit to [facility] for CVA   Significant studies: 6/7>> MRI brain: Acute left superior cerebral artery territory infarct, small infarct in the right cerebellum and left upper pons, small subacute infarct in the high right frontal cortex 6/7>> Brain MRA: Negative for significant stenosis 6/7>> Neck MRA: Negative for significant stenosis 6/8>> Echo: EF 60-65% 6/8>> UDS: Positive for opiates/cannabinoids 6/8>> LDL: 134 6/8>> A1c 5.4 6/9>> bilateral lower extremity Dopplers: Negative for DVT.   COVID-19 medications: Remdesivir: 6/7>> 6/9   Antibiotics: None   Microbiology data: 6/7>> COVID PCR: Positive   Procedures: None   Consults: Neurology  Brief Hospital Course: Acute CVA: Likely embolic-no evidence of A. fib on telemetry/EKG-could be due to  hypercoagulable state from [place].  Work-up as above-neurology recommending to continue Plavix and high intensity statin on discharge.  Discussed with neurology-Dr. Ardeen Jourdain does not recommend outpatient 30-day event monitor-he suspects that this embolic CVA was more likely due to hypercoagulable state from [place].   COVID-19 infection: No major respiratory symptoms-continue Remdesivir x3 days.   Headache: Patient-this is very typical of his migraine headaches-improved with just Tylenol.   Hypokalemia: Repleted   HTN: Initially permissive hypertension was allowed-amlodipine has been subsequently resumed.   HIV: Continue ART   Anxiety/depression: Continue Lexapro/trazodone   CKD stage IIIa: At baseline   Tobacco abuse: Counseled regarding cessation.   Obesity: Estimated body mass index is 37.61 kg/m as calculated from the following:   Height as of this encounter: 5\' 6"  (1.676 m).   Weight as of this encounter: 105.7 kg.     Procedures None  Discharge Diagnoses:  Principal Problem:   Cerebellar stroke ([place]) Active Problems:   Human immunodeficiency virus (HIV) disease ([place])   Hypertension   Dyslipidemia   Tobacco dependence   Chronic kidney disease, stage 3a ([place])   Depression   COVID-19 virus infection   Discharge Instructions:  Activity:  As tolerated   Discharge Instructions     Ambulatory referral to Neurology   Complete by: As directed    Call MD for:  extreme fatigue   Complete by: As directed    Call MD for:  persistant dizziness or light-headedness   Complete by: As directed    Diet - low sodium heart healthy  RADIOLOGIC STUDIES: VAS [place] LOWER EXTREMITY VENOUS (DVT)  Result Date: 01/05/2021  Lower Venous DVT Study Patient Name:  Drew Cuevas  Date of Exam:   01/05/2021 Medical Rec #: [id number]     Accession #:    [id number] Date of Birth: [birth year]    Patient Gender: M Patient Age:   89Y Exam Location:  [facility] Procedure:      VAS [place] LOWER EXTREMITY VENOUS (DVT) Referring Phys: [place] --------------------------------------------------------------------------------  Indications: CVA.  Comparison Study: No prior study Performing Technologist: Maudry Mayhew MHA, RDMS, RVT, RDCS  Examination Guidelines: A complete evaluation includes B-mode imaging, spectral Doppler, color Doppler, and power Doppler as needed of all  accessible portions of each vessel. Bilateral testing is considered an integral part of a complete examination. Limited examinations for reoccurring indications may be performed as noted. The reflux portion of the exam is performed with the patient in reverse Trendelenburg.  +---------+---------------+---------+-----------+----------+--------------+ RIGHT    CompressibilityPhasicitySpontaneityPropertiesThrombus Aging +---------+---------------+---------+-----------+----------+--------------+ CFV      Full           Yes      Yes                                 +---------+---------------+---------+-----------+----------+--------------+ SFJ      Full                                                        +---------+---------------+---------+-----------+----------+--------------+ FV Prox  Full                                                        +---------+---------------+---------+-----------+----------+--------------+ FV Mid   Full                                                        +---------+---------------+---------+-----------+----------+--------------+ FV DistalFull                                                        +---------+---------------+---------+-----------+----------+--------------+ PFV      Full                                                        +---------+---------------+---------+-----------+----------+--------------+ POP      Full           Yes      Yes                                 +---------+---------------+---------+-----------+----------+--------------+ PTV      Full                                                        +---------+---------------+---------+-----------+----------+--------------+  FV Mid   Full                                                        +---------+---------------+---------+-----------+----------+--------------+ FV DistalFull                                                        +---------+---------------+---------+-----------+----------+--------------+ PFV      Full                                                        +---------+---------------+---------+-----------+----------+--------------+ POP      Full           Yes      Yes                                  +---------+---------------+---------+-----------+----------+--------------+ PTV      Full                                                        +---------+---------------+---------+-----------+----------+--------------+ PERO     Full                                                        +---------+---------------+---------+-----------+----------+--------------+     Summary: RIGHT: - There is no evidence of deep vein thrombosis in the lower extremity.  - No cystic structure found in the popliteal fossa.  LEFT: - There is no evidence of deep vein thrombosis in the lower extremity.  - No cystic structure found in the popliteal fossa.  *See table(s) above for measurements and observations.    Preliminary    ECHOCARDIOGRAM LIMITED  Result Date: 01/04/2021    ECHOCARDIOGRAM LIMITED REPORT   Patient Name:   Drew Cuevas Date of Exam: 01/04/2021 Medical Rec #:  [id number]    Height:       66.0 in Accession #:    [id number]   Weight:       233.0 lb Date of Birth:  Oct 01, 1970   BSA:          2.134 m Patient Age:    50 years     BP:           153/82 mmHg Patient Gender: M            HR:           95 bpm. Exam Location:  Inpatient Procedure: Limited Echo, Color Doppler and Cardiac Doppler Indications:    Stroke i63.9  History:        Patient has no prior history of Echocardiogram examinations.

## 2021-01-05 NOTE — Progress Notes (Signed)
Occupational Therapy Treatment Patient Details Name: Drew Cuevas MRN: [id number] DOB: 21-Oct-1970 Today's Date: 01/05/2021    History of present illness 50yo male admitted 01/03/21 with c/o sudden onset HA, dizziness, n/v, and difficulty ambulating. MRI shows acute infarcts in L superior cerebellar artery territory, brainstem, and R cerebellum, also with evidence for subacute R frontal cortex infarct as well as remote left frontal infarct. No tpa given, out of window. PMH kidney CA s/p partial nephrectomy, HIV, HTN   OT comments  Pt agreeable to OT session. Pt reports vertical/horizontal diplopia resolved with partial occlusion of left lens. Pt continues to demonstrate instability, impulsivity, vestibular limitations and visual limitations impacting safety and independence with ADL/IADL and functional mobility. He continues to require minguard assistance at [facility] level during ambulation, ADL while standing and transfers. Pt continued to require education regarding safe use of RW. Pt will continue to benefit from skilled OT services to maximize safety and independence with ADL/IADL and functional mobility. Will continue to follow acutely and progress as tolerated.    Follow Up Recommendations  Outpatient OT (neuro outpatient OT)    Equipment Recommendations  None recommended by OT    Recommendations for Other Services      Precautions / Restrictions Precautions Precautions: Fall;Other (comment) Precaution Comments: possible R LQ vision impairment, central vestibular symptoms Restrictions Weight Bearing Restrictions: No       Mobility Bed Mobility Overal bed mobility: Modified Independent             General bed mobility comments: HOB elevated, use of rail    Transfers Overall transfer level: Needs assistance Equipment used: Rolling walker (2 wheeled) Transfers: Sit to/from Stand Sit to Stand: Min guard         General transfer comment: minguard for safety,    Balance  Overall balance assessment: Mild deficits observed, not formally tested                                         ADL either performed or assessed with clinical judgement   ADL Overall ADL's : Needs assistance/impaired                         Toilet Transfer: Min guard;Ambulation;RW Toilet Transfer Details (indicate cue type and reason): cues for safe use of RW Toileting- Clothing Manipulation and Hygiene: Min guard;Sit to/from stand       Functional mobility during ADLs: Min guard;Rolling walker General ADL Comments: pt demonstrates impulsivity at times with functional mobility, requires cues for safe use of RW     Vision   Vision Assessment?: Yes Eye Alignment: Within Functional Limits Ocular Range of Motion: Within Functional Limits Alignment/Gaze Preference: Within Defined Limits Tracking/Visual Pursuits: Able to track stimulus in all quads without difficulty Saccades: Within functional limits Convergence: Within functional limits Visual Fields: Right visual field deficit;Other (comment) (inferior quadrant) Diplopia Assessment: Disappears with one eye closed;Objects split side to side;Objects split on top of one another Additional Comments: provided pt with glasses with left lens with nasal occlusion, provided handout and educated pt on exercises to complete with glasses on and instructions on reducing level of occlusion over every 2-3 days. Pt reports diplopia resolved with occlussion lenses.   Perception     Praxis      Cognition Arousal/Alertness: Awake/alert Behavior During Therapy: WFL for tasks assessed/performed Overall Cognitive Status: Impaired/Different from baseline Area  of Impairment: Problem solving;Safety/judgement                         Safety/Judgement: Decreased awareness of safety   Problem Solving:  (impulsive at times) General Comments: decreased safety awareness, required cues for safe use of RW at times during  session, appears slightly impulsive at times        Exercises     Shoulder Instructions       General Comments VSS on RA    Pertinent Vitals/ Pain       Pain Assessment: No/denies pain Pain Intervention(s): Monitored during session  Home Living                                          Prior Functioning/Environment              Frequency  Min 2X/week        Progress Toward Goals  OT Goals(current goals can now be found in the care plan section)  Progress towards OT goals: Progressing toward goals  Acute Rehab OT Goals Patient Stated Goal: make sure he fully recovers OT Goal Formulation: With patient Time For Goal Achievement: 01/18/21 Potential to Achieve Goals: Good ADL Goals Pt Will Perform Grooming: Independently;standing Pt Will Transfer to Toilet: Independently;ambulating Additional ADL Goal #1: Pt will demonstrate anticipatory awareness for safe engagement of ADL/IADL and functional mobility.  Plan Discharge plan remains appropriate    Co-evaluation                 AM-PAC OT "6 Clicks" Daily Activity     Outcome Measure   Help from another person eating meals?: A Little Help from another person taking care of personal grooming?: A Little Help from another person toileting, which includes using toliet, bedpan, or urinal?: A Little Help from another person bathing (including washing, rinsing, drying)?: A Little Help from another person to put on and taking off regular upper body clothing?: A Little Help from another person to put on and taking off regular lower body clothing?: A Little 6 Click Score: 18    End of Session Equipment Utilized During Treatment: Gait belt  OT Visit Diagnosis: Other abnormalities of gait and mobility (R26.89);Low vision, both eyes (H54.2)   Activity Tolerance Patient tolerated treatment well   Patient Left in bed;with call bell/phone within reach;with family/visitor present   Nurse  Communication Mobility status;Patient requests pain meds        Time: 3474-2595 OT Time Calculation (min): 25 min  Charges: OT General Charges $OT Visit: 1 Visit OT Treatments $Self Care/Home Management : 23-37 mins  Helene Kelp OTR/L Acute Rehabilitation Services Office: [place] 01/05/2021, 2:13 PM

## 2021-01-05 NOTE — TOC Transition Note (Signed)
Transition of Care [facility]) - CM/SW Discharge Note   Patient Details  Name: Drew Cuevas MRN: [id number] Date of Birth: Jan 16, 1971  [facility] CM/SW Contact:  Ninfa Meeker, RN Phone Number: 01/05/2021, 12:33 PM   Clinical Narrative:  Case manager requested RW for patient. Referral sent  to Neuro outpatient, informtion added to AVS.     Final next level of care: OP Rehab Barriers to Discharge: No Barriers Identified   Patient Goals and CMS Choice        Discharge Placement                       Discharge Plan and Services   Discharge Planning Services: CM Consult Post Acute Care Choice: Durable Medical Equipment          DME Arranged: Walker rolling DME Agency: [employer] Date DME Agency Contacted: 01/05/21 Time DME Agency Contacted: 1215   [place]: NA HH Agency: NA        Social Determinants of Health (SDOH) Interventions     Readmission Risk Interventions No flowsheet data found.

## 2021-01-06 ENCOUNTER — Telehealth: Payer: Self-pay

## 2021-01-06 ENCOUNTER — Telehealth: Payer: Self-pay | Admitting: Internal Medicine

## 2021-01-06 DIAGNOSIS — F17200 Nicotine dependence, unspecified, uncomplicated: Secondary | ICD-10-CM

## 2021-01-06 DIAGNOSIS — I639 Cerebral infarction, unspecified: Secondary | ICD-10-CM

## 2021-01-06 DIAGNOSIS — I10 Essential (primary) hypertension: Secondary | ICD-10-CM

## 2021-01-06 MED ORDER — MISC. DEVICES MISC: 0 refills | Status: AC

## 2021-01-06 MED ORDER — NICOTINE 14 MG/24HR TD PT24: 14.0000 mg | MEDICATED_PATCH | Freq: Every day | TRANSDERMAL | 1 refills | Status: DC

## 2021-01-06 NOTE — Telephone Encounter (Signed)
Transition Care Management Follow-up Telephone Call   Date of discharge and from where:[facility] on 01/05/2021 How have you been since you were released from the hospital? Pt stated he is feeling better  Any questions or concerns? Patient requested orders for BP machine, shower chair and nicotine gum if possible.  Items Reviewed: Did the pt receive and understand the discharge instructions provided? have the instructions and have no questions.  Medications obtained and verified? He said that have the medication list  and the hospital staff reviewed them in detail prior to discharge. He said that he has all of the medications . Any new allergies since your discharge? None reported  Do you have support at home? Yes, family Other (ie: DME, Home Health, etc)     Has walker at home.   Functional Questionnaire: (I = Independent and D = Dependent) ADL's:  Independent.   With walker when ambulating     Follow up appointments reviewed:   PCP Hospital f/u appt confirmed? Dr Wynetta Emery on 01/26/2021@ 1050.  [facility] f/u appt confirmed? scheduled at this time  Are transportation arrangements needed? have transportation   If their condition worsens, is the pt aware to call  their PCP or go to the ED? Yes.Made pt aware if condition worsen or start experiencing rapid weight gain, chest pain, diff breathing, SOB, high fevers, or bleading to refer imediately to ED for further evaluation.  Was the patient provided with contact information for the PCP's office or ED? He has the phone number  Was the pt encouraged to call back with questions or concerns?yes

## 2021-01-06 NOTE — Addendum Note (Signed)
Addended by: Karle Plumber B on: 01/06/2021 02:44 PM   Modules accepted: Orders

## 2021-01-06 NOTE — Telephone Encounter (Signed)
   Drew Cuevas DOB: 22-Jan-1971 MRN: [id number]   RIDER WAIVER AND RELEASE OF LIABILITY  For purposes of improving physical access to our facilities, [facility] is pleased to partner with third parties to provide Drew Cuevas patients or other authorized individuals the option of convenient, on-demand ground transportation services (the [place]") through use of the technology service that enables users to request on-demand ground transportation from independent third-party providers.  By opting to use and accept these [employer], I, the undersigned, hereby agree on behalf of myself, and on behalf of any minor child using the Government social research officer for whom I am the parent or legal guardian, as follows:  Government social research officer provided to me are provided by independent third-party transportation providers who are not [employer] or employees and who are unaffiliated with [employer]. [facility] is neither a transportation carrier nor a common or public carrier. [facility] has no control over the quality or safety of the transportation that occurs as a result of the [employer]. [facility] cannot guarantee that any third-party transportation provider will complete any arranged transportation service. Drew Cuevas makes no representation, warranty, or guarantee regarding the reliability, timeliness, quality, safety, suitability, or availability of any of the Transport Services or that they will be error free. I fully understand that traveling by vehicle involves risks and dangers of serious bodily injury, including permanent disability, paralysis, and death. I agree, on behalf of myself and on behalf of any minor child using the Transport Services for whom I am the parent or legal guardian, that the entire risk arising out of my use of the [employer] remains solely with me, to the maximum extent permitted under applicable law. The [employer] are provided "as  is" and "as available." [facility] disclaims all representations and warranties, express, implied or statutory, not expressly set out in these terms, including the implied warranties of merchantability and fitness for a particular purpose. I hereby waive and release [facility], its agents, employees, officers, directors, representatives, insurers, attorneys, assigns, successors, subsidiaries, and affiliates from any and all past, present, or future claims, demands, liabilities, actions, causes of action, or suits of any kind directly or indirectly arising from acceptance and use of the [employer]. I further waive and release [facility] and its affiliates from all present and future liability and responsibility for any injury or death to persons or damages to property caused by or related to the use of the [employer]. I have read this Waiver and Release of Liability, and I understand the terms used in it and their legal significance. This Waiver is freely and voluntarily given with the understanding that my right (as well as the right of any minor child for whom I am the parent or legal guardian using the [employer]) to legal recourse against [facility] in connection with the [employer] is knowingly surrendered in return for use of these services.   I attest that I read the consent document to Drew Cuevas, gave Drew Cuevas the opportunity to ask questions and answered the questions asked (if any). I affirm that Drew Cuevas then provided consent for he's participation in this program.     Drew Cuevas

## 2021-01-06 NOTE — Telephone Encounter (Signed)
Shower chair has been faxed to [employer] and rx for bp mointor has been faxed to [employer] on file

## 2021-01-12 ENCOUNTER — Other Ambulatory Visit: Payer: Self-pay

## 2021-01-12 ENCOUNTER — Ambulatory Visit: Payer: 59 | Attending: Internal Medicine | Admitting: Physical Therapy

## 2021-01-12 ENCOUNTER — Encounter: Payer: Self-pay | Admitting: Physical Therapy

## 2021-01-12 VITALS — BP 126/95 | HR 96

## 2021-01-12 DIAGNOSIS — R2689 Other abnormalities of gait and mobility: Secondary | ICD-10-CM | POA: Insufficient documentation

## 2021-01-12 DIAGNOSIS — R2681 Unsteadiness on feet: Secondary | ICD-10-CM | POA: Diagnosis present

## 2021-01-12 DIAGNOSIS — R278 Other lack of coordination: Secondary | ICD-10-CM | POA: Diagnosis not present

## 2021-01-12 NOTE — Therapy (Signed)
[facility] [address] [place], [place], [address] Phone: [phone]   Fax:  [phone]  Physical Therapy Evaluation  Patient Details  Name: Drew Cuevas MRN: [id number] Date of Birth: November 02, 1970 Referring Provider (PT): Referred by hospitalist; will send to PCP: Ladell Pier, MD   Encounter Date: 01/12/2021   PT End of Session - 01/12/21 1125     Visit Number 1    Number of Visits 17    Date for PT Re-Evaluation 03/13/21    Authorization Type [facility] [employer]; 30 VL - authorization required after 20th visit    Authorization - Visit Number 1    Authorization - Number of Visits 20    PT Start Time 1017    PT Stop Time 1058    PT Time Calculation (min) 41 min    Activity Tolerance Patient tolerated treatment well    Behavior During Therapy [facility] for tasks assessed/performed             Past Medical History:  Diagnosis Date   Bronchitis    LAST FLARE UP WAS NEW YEARS 2015   Cancer ([place])    kidney   GERD (gastroesophageal reflux disease)    NO MEDS   HIV (human immunodeficiency virus infection) ([place])    UNDER CONTROL WITH MEDICATIONS   Hypertension    Immune deficiency disorder ([place])    Renal insufficiency 05/17/2015   Renal mass, right     Past Surgical History:  Procedure Laterality Date   NO PAST SURGERIES     ROBOT ASSISTED LAPAROSCOPIC NEPHRECTOMY Right 08/19/2013   Procedure: ROBOTIC ASSISTED LAPAROSCOPIC RIGHT PARTIAL NEPHRECTOMY;  Surgeon: Ardis Hughs, MD;  Location: [facility];  Service: Urology;  Laterality: Right;    Vitals:   01/12/21 1023  BP: (!) 126/95  Pulse: 96      Subjective Assessment - 01/12/21 1025     Subjective Pt reports having impaired coordination and control in LUE and LLE; also feels like LLE is a little weaker.  At first pt was having diplopia but that has resolved - may have a little when he first wakes up.  No dizziness.  Is now ambulating with a cane.   Has to focus more on LUE when using it for dressing.    Patient is accompained by: Family member    Pertinent History Kidney CA, HIV, bronchitis, HTN, renal insufficiency, nephrectomy    Limitations Standing;Walking    Patient Stated Goals Walking better, walking without the cane    Currently in Pain? No/denies                [facility] PT Assessment - 01/12/21 1027       Assessment   Medical Diagnosis Cerebellar CVA    Referring Provider (PT) Referred by hospitalist; will send to PCP: Ladell Pier, MD    Onset Date/Surgical Date 01/05/21    Hand Dominance Right    Prior Therapy no      Precautions   Precautions None    Precaution Comments Kidney CA, HIV, bronchitis, HTN, renal insufficiency, nephrectomy      Balance Screen   Has the patient fallen in the past 6 months No      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      Observation/Other Assessments   Focus on Therapeutic Outcomes (FOTO)  Not captured      Sensation   Light Touch Appears Intact      Coordination   Gross Motor Movements are Fluid and Coordinated No    Finger Nose Finger Test Overshooting    Heel Shin Test Slightly dysmetric      ROM / Strength   AROM / PROM / Strength Strength      Strength   Overall Strength Within functional limits for tasks performed    Overall Strength Comments Full strength in LUE and LLE but pt demonstrates poor motor planning and sequencing      Ambulation/Gait    Ambulation/Gait Yes    Ambulation/Gait Assistance 5: Supervision    Ambulation Distance (Feet) 115 Feet    Assistive device Straight cane    Gait Pattern Step-through pattern;Decreased step length - right;Decreased step length - left;Decreased stance time - left;Decreased stride length;Decreased hip/knee flexion - left;Decreased dorsiflexion - left;Decreased weight shift to left;Wide base of support;Poor foot clearance - left    Ambulation Surface Level;Indoor    Stairs Yes    Stairs Assistance 4: Min assist    Stairs Assistance Details (indicate cue type and reason) Cane in LUE and rail in R to ascend, cane in RUE, rail in LUE descending - cues to attend to LUE placement and advance LUE down rail.  When ascending pt caught L foot on stair multiple times.  When descending pt required min A due to impaired stepping sequence    Stair Management Technique One rail Right;Step to pattern;Forwards;With cane    Number of Stairs 4    Height of Stairs 6      Standardized Balance Assessment   Standardized Balance Assessment Dynamic Gait Index;10 meter walk test;Five Times Sit to Stand    Five times sit to stand comments  14.47 no UE, from chair with slight anterior LOB    10 Meter Walk 18 second with cane or 1.8 ft/sec, 16 seconds without cane or 2.05 ft/sec with veering to L      Dynamic Gait Index   Level Surface Moderate Impairment    Change in Gait Speed Mild Impairment    Gait with Horizontal Head Turns Mild Impairment    Gait with Vertical Head Turns Mild Impairment    Gait and Pivot Turn Mild Impairment    Step Over Obstacle Moderate Impairment    Step Around Obstacles Mild Impairment    Steps Moderate Impairment    Total Score 13    DGI comment: 13/24                        Objective measurements completed on examination: See above findings.               PT Education - 01/12/21 1125     Education Details clinical findings, PT POC and goals    Person(s)  Educated Patient;Spouse    Methods Explanation    Comprehension Verbalized understanding  PT Short Term Goals - 01/12/21 1649       PT SHORT TERM GOAL #1   Title Pt will demonstrate independence with initial HEP    Time 4    Period Weeks    Status New    Target Date 02/11/21      PT SHORT TERM GOAL #2   Title Pt will improve five time sit to stand to </= 13.5 seconds with no anterior LOB upon rising    Baseline 14 seconds with anterior LOB when standing    Time 4    Period Weeks    Status New    Target Date 02/11/21      PT SHORT TERM GOAL #3   Title Pt will improve gait velocity without cane to >/= 2.62 ft/sec    Baseline 2.05 ft/sec without cane    Time 4    Period Weeks    Status New    Target Date 02/11/21      PT SHORT TERM GOAL #4   Title Pt will increase DGI score by 4 points to indicate decreased falls risk    Baseline 13/24    Time 4    Period Weeks    Status New    Target Date 02/11/21      PT SHORT TERM GOAL #5   Title Pt will negotiate 4 stairs with one rail (R to ascend) with alternating sequence and min A (no cane)    Time 4    Period Weeks    Status New    Target Date 02/11/21               PT Long Term Goals - 01/12/21 1654       PT LONG TERM GOAL #1   Title Pt will demonstrate independence with final HEP    Time 8    Period Weeks    Status New    Target Date 03/13/21      PT LONG TERM GOAL #2   Title Pt will increase gait velocity without cane to >/= 3.0 ft/sec with increased L arm swing    Time 8    Period Weeks    Status New    Target Date 03/13/21      PT LONG TERM GOAL #3   Title Pt will increase DGI score to >/= 20/24 without use of cane    Time 8    Period Weeks    Status New    Target Date 03/13/21      PT LONG TERM GOAL #4   Title Pt will negotiate 4 stairs with 1 rail, alternating sequence MOD I (no cane) to improve safety with home entry/exit    Time 8    Period Weeks    Status New    Target  Date 03/13/21      PT LONG TERM GOAL #5   Title Pt will demonstrate ability to perform functional bimanual task safely and independently without having to look at LUE when performing    Time 8    Period Weeks    Status New    Target Date 03/13/21                    Plan - 01/12/21 1126     Clinical Impression Statement Pt is a 50 year old male referred to Neuro OPPT for evaluation of cerebellar CVA while infected with COVID-19.  Pt's PMH is significant for the following: Kidney CA, HIV, bronchitis,  HTN, renal insufficiency, nephrectomy. The following deficits were noted during pt's exam: impaired coordination and motor planning in LUE and LLE, impaired vision, impaired standing balance and impaired gait.  Pt's gait velocity and DGI scores indicates pt is at high risk for falls. Pt would benefit from skilled PT to address these impairments and functional limitations to maximize functional mobility independence and reduce falls risk.    Personal Factors and Comorbidities Comorbidity 3+;Profession    Comorbidities Kidney CA, HIV, bronchitis, HTN, renal insufficiency, nephrectomy    Examination-Activity Limitations [place];Carry;Dressing;Lift;Locomotion Level;Reach Overhead;Stairs    Examination-Participation Restrictions Community Activity;Driving;Occupation    Stability/Clinical Decision Making Evolving/Moderate complexity    Clinical Decision Making Moderate    Rehab Potential Good    PT Frequency 3x / week    PT Duration 8 weeks    PT Treatment/Interventions ADLs/Self Care Home Management;Gait training;Stair training;Functional mobility training;Therapeutic activities;Therapeutic exercise;Balance training;Neuromuscular re-education;Patient/family education;Orthotic Fit/Training;Vestibular;Visual/perceptual remediation/compensation    PT Next Visit Plan Check visual/vestibular.  Start HEP including UE, coordination, WB LLE, balance    Consulted and Agree with Plan of Care Patient              Patient will benefit from skilled therapeutic intervention in order to improve the following deficits and impairments:  Abnormal gait, Decreased activity tolerance, Decreased balance, Decreased coordination, Difficulty walking, Impaired UE functional use, Impaired vision/preception  Visit Diagnosis: Other lack of coordination  Other abnormalities of gait and mobility  Unsteadiness on feet     Problem List Patient Active Problem List   Diagnosis Date Noted   Cerebellar stroke ([place]) 01/03/2021   COVID-19 virus infection 01/03/2021   Diverticulitis of large intestine with abscess 09/27/2020   Depression 06/19/2019   Other insomnia 06/19/2019   Chronic kidney disease, stage 3a ([place]) 04/15/2018   Tobacco dependence 01/13/2018   Primary osteoarthritis of right knee 04/03/2017   Dyslipidemia 06/14/2016   Renal cell carcinoma of right kidney ([place]) 08/19/2013   Renal cyst 05/21/2013   Obesity (BMI 30-39.9) 05/26/2012   Internal hemorrhoids 12/01/2008   Hypertension 04/28/2008   Major depressive disorder, single episode, severe ([place]) 12/20/2006   Human immunodeficiency virus (HIV) disease ([place]) 08/28/2006    Rico Junker, PT, DPT 01/12/21    5:00 PM    [facility] [address] [place], [place], [address] Phone: [phone]   Fax:  [phone]  Name: Drew Cuevas MRN: [id number] Date of Birth: [birth year]

## 2021-01-13 ENCOUNTER — Ambulatory Visit: Payer: 59 | Admitting: Physical Therapy

## 2021-01-13 DIAGNOSIS — R2689 Other abnormalities of gait and mobility: Secondary | ICD-10-CM

## 2021-01-13 DIAGNOSIS — R278 Other lack of coordination: Secondary | ICD-10-CM

## 2021-01-13 DIAGNOSIS — R2681 Unsteadiness on feet: Secondary | ICD-10-CM

## 2021-01-13 NOTE — Therapy (Signed)
[facility] [address] [place], [place], [address] Phone: [phone]   Fax:  [phone]  Physical Therapy Treatment  Patient Details  Name: Drew Cuevas MRN: [id number] Date of Birth: 11-Oct-1970 Referring Provider (PT): Referred by hospitalist; will send to PCP: Ladell Pier, MD   Encounter Date: 01/13/2021   PT End of Session - 01/13/21 1527     Visit Number 2    Number of Visits 17    Date for PT Re-Evaluation 03/13/21    Authorization Type [employer]; 30 VL - authorization required after 20th visit    Authorization - Visit Number 2    Authorization - Number of Visits 20    PT Start Time 2355    PT Stop Time 1318    PT Time Calculation (min) 43 min    Activity Tolerance Patient tolerated treatment well    Behavior During Therapy [facility] for tasks assessed/performed             Past Medical History:  Diagnosis Date   Bronchitis    LAST FLARE UP WAS NEW YEARS 2015   Cancer ([place])    kidney   GERD (gastroesophageal reflux disease)    NO MEDS   HIV (human immunodeficiency virus infection) ([place])    UNDER CONTROL WITH MEDICATIONS   Hypertension    Immune deficiency disorder ([place])    Renal insufficiency 05/17/2015   Renal mass, right     Past Surgical History:  Procedure Laterality Date   NO PAST SURGERIES     ROBOT ASSISTED LAPAROSCOPIC NEPHRECTOMY Right 08/19/2013   Procedure: ROBOTIC ASSISTED LAPAROSCOPIC RIGHT PARTIAL NEPHRECTOMY;  Surgeon: Ardis Hughs, MD;  Location: [facility];  Service: Urology;  Laterality: Right;    There were no vitals filed for this visit.   Subjective Assessment - 01/13/21 1242     Subjective Felt good after evaluation yesterday.  No issues to report today.    Pertinent History Kidney CA, HIV, bronchitis, HTN, renal insufficiency, nephrectomy    Limitations Standing;Walking    Patient Stated Goals Walking better, walking without the cane    Currently in  Pain? No/denies                     Vestibular Assessment - 01/13/21 1243       Symptom Behavior   Subjective history of current problem Diplopia is resolving    Type of Dizziness  Diplopia      Oculomotor Exam   Oculomotor Alignment Normal    Ocular ROM WFL    Spontaneous Absent    Gaze-induced  Absent    Smooth Pursuits Intact    Saccades Overshoots    Comment intact peripheral vision      Vestibulo-Ocular Reflex   VOR to Slow Head Movement Normal    VOR Cancellation Corrective saccades            Established initial HEP focusing on functional WB, weight shifting, static and dynamic standing balance.  Pt return demonstrated all exercises below safely.  Access Code: [email or web address] URL: [email or web address] Date: 01/13/2021 Prepared by: Misty Stanley  Exercises Staggered Sit-to-Stand - 1 x daily - 7 x weekly - 2 sets - 10 reps Single Leg Stance with Support - 1 x daily - 7 x weekly - 3 sets - 10 second hold Standing with Eyes Closed - 1 x daily - 7 x weekly - 2 sets - 8 reps Side Stepping with Counter Support - 1 x  daily - 7 x weekly - 4 sets Forward Walking with Head Rotations - 1 x daily - 7 x weekly - 4 sets   NMR: In sitting set up mirror to block LUE with reflection of RUE in mirror as "LUE".  Cued pt to attend to hand in the mirror while performing 10 reps each: UE raises, shoulder IR/ER, elbow flex <> ext, hand opening and closing, finger opposition, reaching/taking/returning ball and turning ball in hand to improve attention and awareness of LUE and to improve coordination and grading of movement in LUE.       PT Education - 01/13/21 1536     Education Details visual and oculomotor findings, initial HEP    Person(s) Educated Patient    Methods Explanation;Demonstration;Handout    Comprehension Verbalized understanding;Returned demonstration              PT Short Term Goals - 01/12/21 1649       PT SHORT TERM GOAL #1   Title  Pt will demonstrate independence with initial HEP    Time 4    Period Weeks    Status New    Target Date 02/11/21      PT SHORT TERM GOAL #2   Title Pt will improve five time sit to stand to </= 13.5 seconds with no anterior LOB upon rising    Baseline 14 seconds with anterior LOB when standing    Time 4    Period Weeks    Status New    Target Date 02/11/21      PT SHORT TERM GOAL #3   Title Pt will improve gait velocity without cane to >/= 2.62 ft/sec    Baseline 2.05 ft/sec without cane    Time 4    Period Weeks    Status New    Target Date 02/11/21      PT SHORT TERM GOAL #4   Title Pt will increase DGI score by 4 points to indicate decreased falls risk    Baseline 13/24    Time 4    Period Weeks    Status New    Target Date 02/11/21      PT SHORT TERM GOAL #5   Title Pt will negotiate 4 stairs with one rail (R to ascend) with alternating sequence and min A (no cane)    Time 4    Period Weeks    Status New    Target Date 02/11/21               PT Long Term Goals - 01/12/21 1654       PT LONG TERM GOAL #1   Title Pt will demonstrate independence with final HEP    Time 8    Period Weeks    Status New    Target Date 03/13/21      PT LONG TERM GOAL #2   Title Pt will increase gait velocity without cane to >/= 3.0 ft/sec with increased L arm swing    Time 8    Period Weeks    Status New    Target Date 03/13/21      PT LONG TERM GOAL #3   Title Pt will increase DGI score to >/= 20/24 without use of cane    Time 8    Period Weeks    Status New    Target Date 03/13/21      PT LONG TERM GOAL #4   Title Pt will negotiate 4 stairs with 1  rail, alternating sequence MOD I (no cane) to improve safety with home entry/exit    Time 8    Period Weeks    Status New    Target Date 03/13/21      PT LONG TERM GOAL #5   Title Pt will demonstrate ability to perform functional bimanual task safely and independently without having to look at LUE when performing     Time 8    Period Weeks    Status New    Target Date 03/13/21                   Plan - 01/13/21 1527     Clinical Impression Statement Treatment session today focused on assessment of vision and oculomotor impairments - pt noted to have impaired saccades (overshooting) and impaired VOR cancellation.  Initiated HEP with focus on LLE functional WB strengthening, standing balance and coordination.  Pt return demonstrated all exercises safely.  Utilized mirror therapy for NMR to increase attention, awareness and coordination in LUE.  Pt observed to be using cane less for support today but continues to present with wide BOS and decreased step length.  Will continue to address and progress towards LTG.    Personal Factors and Comorbidities Comorbidity 3+;Profession    Comorbidities Kidney CA, HIV, bronchitis, HTN, renal insufficiency, nephrectomy    Examination-Activity Limitations Bend;Carry;Dressing;Lift;Locomotion Level;Reach Overhead;Stairs    Examination-Participation Restrictions Community Activity;Driving;Occupation    Stability/Clinical Decision Making Evolving/Moderate complexity    Rehab Potential Good    PT Frequency 3x / week    PT Duration 8 weeks    PT Treatment/Interventions ADLs/Self Care Home Management;Gait training;Stair training;Functional mobility training;Therapeutic activities;Therapeutic exercise;Balance training;Neuromuscular re-education;Patient/family education;Orthotic Fit/Training;Vestibular;Visual/perceptual remediation/compensation    PT Next Visit Plan 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  Continue to work on functional gait, increased stance on LLE, step length.  Use of LUE when ambulating (carrying, reaching, etc).  Safe stair negotiation.  Visual exercises: saccades and VOR cancllation    Consulted and Agree with Plan of Care Patient             Patient will benefit from skilled therapeutic intervention in order to improve the  following deficits and impairments:  Abnormal gait, Decreased activity tolerance, Decreased balance, Decreased coordination, Difficulty walking, Impaired UE functional use, Impaired vision/preception  Visit Diagnosis: Other lack of coordination  Other abnormalities of gait and mobility  Unsteadiness on feet     Problem List Patient Active Problem List   Diagnosis Date Noted   Cerebellar stroke ([place]) 01/03/2021   COVID-19 virus infection 01/03/2021   Diverticulitis of large intestine with abscess 09/27/2020   Depression 06/19/2019   Other insomnia 06/19/2019   Chronic kidney disease, stage 3a ([place]) 04/15/2018   Tobacco dependence 01/13/2018   Primary osteoarthritis of right knee 04/03/2017   Dyslipidemia 06/14/2016   Renal cell carcinoma of right kidney ([place]) 08/19/2013   Renal cyst 05/21/2013   Obesity (BMI 30-39.9) 05/26/2012   Internal hemorrhoids 12/01/2008   Hypertension 04/28/2008   Major depressive disorder, single episode, severe ([place]) 12/20/2006   Human immunodeficiency virus (HIV) disease ([place]) 08/28/2006   Rico Junker, PT, DPT 01/13/21    3:45 PM    [facility] [address] [place], [place], [address] Phone: [phone]   Fax:  [phone]  Name: Drew Cuevas MRN: [id number] Date of Birth: [birth year]

## 2021-01-13 NOTE — Patient Instructions (Signed)
Access Code: [email or web address] URL: [email or web address] Date: 01/13/2021 Prepared by: Misty Stanley  Exercises Staggered Sit-to-Stand - 1 x daily - 7 x weekly - 2 sets - 10 reps Single Leg Stance with Support - 1 x daily - 7 x weekly - 3 sets - 10 second hold Standing with Eyes Closed - 1 x daily - 7 x weekly - 2 sets - 8 reps Side Stepping with Counter Support - 1 x daily - 7 x weekly - 4 sets Forward Walking with Head Rotations - 1 x daily - 7 x weekly - 4 sets

## 2021-01-16 ENCOUNTER — Telehealth (HOSPITAL_COMMUNITY): Payer: Self-pay

## 2021-01-16 ENCOUNTER — Other Ambulatory Visit (HOSPITAL_COMMUNITY): Payer: Self-pay

## 2021-01-16 NOTE — Telephone Encounter (Signed)
Pharmacy Transitions of Care Follow-up Telephone Call  Date of discharge: 01/05/21  Discharge Diagnosis: Cerebellar Stroke  How have you been since you were released from the hospital?  Getting better since discharge. Has follow up scheduled with PCP and rehab  Medication changes made at discharge:            START taking: atorvastatin (LIPITOR)  clopidogrel (PLAVIX)    CHANGE how you take: escitalopram (LEXAPRO)  traZODone (DESYREL)  Triumeq   STOP taking: lovastatin 40 MG tablet (MEVACOR)   Medication changes verified by the patient?  Yes    Medication Accessibility:  Home Pharmacy:  [facility] on [address]  Was the patient provided with refills on discharged medications? No   Have all prescriptions been transferred from [facility] to home pharmacy? N/A   Is the patient able to afford medications? No insurance - discharge on MATCH    Medication Review: CLOPIDOGREL (PLAVIX) Clopidogrel 75 mg once daily.  - Educated patient on expected duration of therapy of ASA 81 with clopidogrel.  - Reviewed potential DDIs with patient  - Advised patient of medications to avoid (NSAIDs, ASA)  - Educated that Tylenol (acetaminophen) will be the preferred analgesic to prevent risk of bleeding  - Emphasized importance of monitoring for signs and symptoms of bleeding (abnormal bruising, prolonged bleeding, nose bleeds, bleeding from gums, discolored urine, black tarry stools)  - Advised patient to alert all providers of anticoagulation therapy prior to starting a new medication or having a procedure   Follow-up Appointments:  PCP Hospital f/u appt confirmed? Dr. Daisy Blossom in IM on 01/20/21. Dr. Wynetta Emery is PCP and sees her on 01/27/32  Specialist Hospital f/u appt confirmed? Infectious Disease on 01/18/21 with Dr. Megan Salon  If their condition worsens, is the pt aware to call PCP or go to the Emergency Dept.? yes  Final Patient Assessment: Patient doing well. Has follow up scheduled and knows  to get refills from PCP

## 2021-01-17 ENCOUNTER — Ambulatory Visit: Payer: 59 | Admitting: Physical Therapy

## 2021-01-17 ENCOUNTER — Other Ambulatory Visit: Payer: Self-pay

## 2021-01-17 DIAGNOSIS — R278 Other lack of coordination: Secondary | ICD-10-CM | POA: Diagnosis not present

## 2021-01-17 DIAGNOSIS — R2689 Other abnormalities of gait and mobility: Secondary | ICD-10-CM

## 2021-01-17 DIAGNOSIS — R2681 Unsteadiness on feet: Secondary | ICD-10-CM

## 2021-01-17 NOTE — Therapy (Signed)
of cane    Time 8    Period Weeks    Status New    Target Date 03/13/21      PT LONG TERM GOAL #4   Title Pt will negotiate 4 stairs with 1 rail, alternating sequence MOD I (no cane) to improve safety with home entry/exit    Time 8    Period Weeks    Status New    Target Date 03/13/21      PT LONG TERM GOAL #5   Title Pt will demonstrate ability to perform functional bimanual task safely and independently  without having to look at LUE when performing    Time 8    Period Weeks    Status New    Target Date 03/13/21                   Plan - 01/17/21 1145     Clinical Impression Statement FOcused on addressing visual/oculomotor impairments with exercises for saccades and VOR cancellation. Pt with some increased dizziness with saccades; VOR cancellation with no issues in seated. Worked on improving gait and stair negotiation. Improved L LE weight shift and symmetrical step length. Continues to demo decreased L arm swing. Pt is demonstrating good progress.    Personal Factors and Comorbidities Comorbidity 3+;Profession    Comorbidities Kidney CA, HIV, bronchitis, HTN, renal insufficiency, nephrectomy    Examination-Activity Limitations Bend;Carry;Dressing;Lift;Locomotion Level;Reach Overhead;Stairs    Examination-Participation Restrictions Community Activity;Driving;Occupation    Stability/Clinical Decision Making Evolving/Moderate complexity    Rehab Potential Good    PT Frequency 3x / week    PT Duration 8 weeks    PT Treatment/Interventions ADLs/Self Care Home Management;Gait training;Stair training;Functional mobility training;Therapeutic activities;Therapeutic exercise;Balance training;Neuromuscular re-education;Patient/family education;Orthotic Fit/Training;Vestibular;Visual/perceptual remediation/compensation    PT Next Visit Plan 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  Continue to work on functional gait, increased stance on LLE, step length.  Use of LUE when ambulating (carrying, reaching, etc).  Safe stair negotiation.  Visual exercises: saccades and VOR cancllation    Consulted and Agree with Plan of Care Patient             Patient will benefit from skilled therapeutic intervention in order to improve the following deficits and impairments:  Abnormal gait, Decreased activity tolerance, Decreased balance, Decreased coordination, Difficulty walking, Impaired UE  functional use, Impaired vision/preception  Visit Diagnosis: Other lack of coordination  Other abnormalities of gait and mobility  Unsteadiness on feet     Problem List Patient Active Problem List   Diagnosis Date Noted   Cerebellar stroke ([place]) 01/03/2021   COVID-19 virus infection 01/03/2021   Diverticulitis of large intestine with abscess 09/27/2020   Depression 06/19/2019   Other insomnia 06/19/2019   Chronic kidney disease, stage 3a ([place]) 04/15/2018   Tobacco dependence 01/13/2018   Primary osteoarthritis of right knee 04/03/2017   Dyslipidemia 06/14/2016   Renal cell carcinoma of right kidney ([place]) 08/19/2013   Renal cyst 05/21/2013   Obesity (BMI 30-39.9) 05/26/2012   Internal hemorrhoids 12/01/2008   Hypertension 04/28/2008   Major depressive disorder, single episode, severe ([place]) 12/20/2006   Human immunodeficiency virus (HIV) disease ([place]) 08/28/2006    Lomax Poehler April Ma L Verba Ainley PT, DPT 01/17/2021, 3:22 PM  [facility] [address] [place], [place], [address] Phone: [phone]   Fax:  [phone]  Name: Drew Cuevas MRN: [id number] Date of Birth: Sep 26, 1970  [facility] [address] [place], [place], [address] Phone: [phone]   Fax:  [phone]  Physical Therapy Treatment  Patient Details  Name: Drew Cuevas MRN: [id number] Date of Birth: [birth year] Referring Provider (PT): Referred by hospitalist; will send to PCP: Ladell Pier, MD   Encounter Date: 01/17/2021   PT End of Session - 01/17/21 1101     Visit Number 3    Number of Visits 17    Date for PT Re-Evaluation 03/13/21    Authorization Type [employer]; 30 VL - authorization required after 20th visit    Authorization - Visit Number 3    Authorization - Number of Visits 20    PT Start Time 1101    PT Stop Time 3151    PT Time Calculation (min) 44 min    Activity Tolerance Patient tolerated treatment well    Behavior During Therapy [facility] for tasks assessed/performed             Past Medical History:  Diagnosis Date   Bronchitis    LAST FLARE UP WAS NEW YEARS 2015   Cancer ([place])    kidney   GERD (gastroesophageal reflux disease)    NO MEDS   HIV (human immunodeficiency virus infection) ([place])    UNDER CONTROL WITH MEDICATIONS   Hypertension    Immune deficiency disorder ([place])    Renal insufficiency 05/17/2015   Renal mass, right     Past Surgical History:  Procedure Laterality Date   NO PAST SURGERIES     ROBOT ASSISTED LAPAROSCOPIC NEPHRECTOMY Right 08/19/2013   Procedure: ROBOTIC ASSISTED LAPAROSCOPIC RIGHT PARTIAL NEPHRECTOMY;  Surgeon: Ardis Hughs, MD;  [facility];  Service: Urology;  Laterality: Right;    There were no vitals filed for this visit.   Subjective Assessment - 01/17/21 1103     Subjective Pt states he's been doing his exercises at home. No issues to report.    Pertinent History Kidney CA, HIV, bronchitis, HTN, renal insufficiency, nephrectomy    Limitations Standing;Walking    Patient Stated Goals Walking better, walking without the cane                                [facility] Adult PT Treatment/Exercise - 01/17/21 0001       Ambulation/Gait   Ambulation/Gait Assistance 5: Supervision    Ambulation Distance (Feet) 115 Feet    Assistive device None    Gait Pattern Step-through pattern;Decreased stance time - left;Decreased stride length;Decreased hip/knee flexion - left;Decreased dorsiflexion - left;Decreased weight shift to left;Wide base of support;Poor foot clearance - left   Decreased L arm swing   Ambulation Surface Level;Indoor    Stairs Assistance 4: Min assist    Stairs Assistance Details (indicate cue type and reason) Assist/tactile cues for L LE stability with R LE step down.    Stair Management Technique One rail Right;Alternating pattern    Number of Stairs 8    Height of Stairs 6    Pre-Gait Activities Knee flexion/extension into heel strike with narrower BOS 2x10; turning to L x5    Gait Comments Cues for L LE weight shift and decreased hip rotation. Cues for heel strike and knee extension during stance phase             Vestibular Treatment/Exercise - 01/17/21 0001       Vestibular Treatment/Exercise   Gaze Exercises Eye/Head Exercise  [facility] [address] [place], [place], [address] Phone: [phone]   Fax:  [phone]  Physical Therapy Treatment  Patient Details  Name: Drew Cuevas MRN: [id number] Date of Birth: [birth year] Referring Provider (PT): Referred by hospitalist; will send to PCP: Ladell Pier, MD   Encounter Date: 01/17/2021   PT End of Session - 01/17/21 1101     Visit Number 3    Number of Visits 17    Date for PT Re-Evaluation 03/13/21    Authorization Type [employer]; 30 VL - authorization required after 20th visit    Authorization - Visit Number 3    Authorization - Number of Visits 20    PT Start Time 1101    PT Stop Time 3151    PT Time Calculation (min) 44 min    Activity Tolerance Patient tolerated treatment well    Behavior During Therapy [facility] for tasks assessed/performed             Past Medical History:  Diagnosis Date   Bronchitis    LAST FLARE UP WAS NEW YEARS 2015   Cancer ([place])    kidney   GERD (gastroesophageal reflux disease)    NO MEDS   HIV (human immunodeficiency virus infection) ([place])    UNDER CONTROL WITH MEDICATIONS   Hypertension    Immune deficiency disorder ([place])    Renal insufficiency 05/17/2015   Renal mass, right     Past Surgical History:  Procedure Laterality Date   NO PAST SURGERIES     ROBOT ASSISTED LAPAROSCOPIC NEPHRECTOMY Right 08/19/2013   Procedure: ROBOTIC ASSISTED LAPAROSCOPIC RIGHT PARTIAL NEPHRECTOMY;  Surgeon: Ardis Hughs, MD;  [facility];  Service: Urology;  Laterality: Right;    There were no vitals filed for this visit.   Subjective Assessment - 01/17/21 1103     Subjective Pt states he's been doing his exercises at home. No issues to report.    Pertinent History Kidney CA, HIV, bronchitis, HTN, renal insufficiency, nephrectomy    Limitations Standing;Walking    Patient Stated Goals Walking better, walking without the cane                                [facility] Adult PT Treatment/Exercise - 01/17/21 0001       Ambulation/Gait   Ambulation/Gait Assistance 5: Supervision    Ambulation Distance (Feet) 115 Feet    Assistive device None    Gait Pattern Step-through pattern;Decreased stance time - left;Decreased stride length;Decreased hip/knee flexion - left;Decreased dorsiflexion - left;Decreased weight shift to left;Wide base of support;Poor foot clearance - left   Decreased L arm swing   Ambulation Surface Level;Indoor    Stairs Assistance 4: Min assist    Stairs Assistance Details (indicate cue type and reason) Assist/tactile cues for L LE stability with R LE step down.    Stair Management Technique One rail Right;Alternating pattern    Number of Stairs 8    Height of Stairs 6    Pre-Gait Activities Knee flexion/extension into heel strike with narrower BOS 2x10; turning to L x5    Gait Comments Cues for L LE weight shift and decreased hip rotation. Cues for heel strike and knee extension during stance phase             Vestibular Treatment/Exercise - 01/17/21 0001       Vestibular Treatment/Exercise   Gaze Exercises Eye/Head Exercise

## 2021-01-18 ENCOUNTER — Other Ambulatory Visit: Payer: Self-pay

## 2021-01-18 ENCOUNTER — Encounter: Payer: Self-pay | Admitting: Internal Medicine

## 2021-01-18 ENCOUNTER — Ambulatory Visit (INDEPENDENT_AMBULATORY_CARE_PROVIDER_SITE_OTHER): Payer: 59 | Admitting: Internal Medicine

## 2021-01-18 DIAGNOSIS — U071 COVID-19: Secondary | ICD-10-CM

## 2021-01-18 DIAGNOSIS — I639 Cerebral infarction, unspecified: Secondary | ICD-10-CM | POA: Diagnosis not present

## 2021-01-18 DIAGNOSIS — B20 Human immunodeficiency virus [HIV] disease: Secondary | ICD-10-CM | POA: Diagnosis not present

## 2021-01-18 MED ORDER — TRIUMEQ 600-50-300 MG PO TABS: 1.0000 | ORAL_TABLET | Freq: Every day | ORAL | 11 refills | Status: DC

## 2021-01-18 NOTE — Progress Notes (Signed)
Patient Active Problem List   Diagnosis Date Noted   Cerebellar stroke ([place]) 01/03/2021    Priority: High   COVID-19 virus infection 01/03/2021    Priority: High   Human immunodeficiency virus (HIV) disease ([place]) 08/28/2006    Priority: High   Renal cell carcinoma of right kidney ([place]) 08/19/2013    Priority: Medium   Hypertension 04/28/2008    Priority: Medium   Diverticulitis of large intestine with abscess 09/27/2020   Depression 06/19/2019   Other insomnia 06/19/2019   Chronic kidney disease, stage 3a ([place]) 04/15/2018   Tobacco dependence 01/13/2018   Primary osteoarthritis of right knee 04/03/2017   Dyslipidemia 06/14/2016   Renal cyst 05/21/2013   Obesity (BMI 30-39.9) 05/26/2012   Internal hemorrhoids 12/01/2008   Major depressive disorder, single episode, severe ([place]) 12/20/2006    Patient's Medications  New Prescriptions   No medications on file  Previous Medications   ACETAMINOPHEN (TYLENOL) 325 MG TABLET    Take 2 tablets (650 mg total) by mouth every 6 (six) hours as needed for mild pain (or Fever >/= 101).   ALBUTEROL (VENTOLIN HFA) 108 (90 BASE) MCG/ACT INHALER    Inhale 1-2 puffs into the lungs every 6 (six) hours as needed for wheezing or shortness of breath.   AMLODIPINE (NORVASC) 10 MG TABLET    Take 1 tablet (10 mg total) by mouth daily.   ATORVASTATIN (LIPITOR) 80 MG TABLET    Take 1 tablet (80 mg total) by mouth daily.   CLOPIDOGREL (PLAVIX) 75 MG TABLET    Take 1 tablet (75 mg total) by mouth daily.   ESCITALOPRAM (LEXAPRO) 10 MG TABLET    TAKE 1 TABLET(10 MG) BY MOUTH DAILY   MISC. DEVICES MISC    1 home blood pressure monitor   NICOTINE (NICODERM CQ - DOSED IN MG/24 HOURS) 14 MG/24HR PATCH    Place 1 patch (14 mg total) onto the skin daily.   TRAZODONE (DESYREL) 50 MG TABLET    TAKE 1/2 TABLET(25 MG) BY MOUTH AT BEDTIME  Modified Medications   Modified Medication Previous Medication   ABACAVIR-DOLUTEGRAVIR-LAMIVUDINE (TRIUMEQ) 600-50-300  MG TABLET TRIUMEQ 600-50-300 MG tablet      Take 1 tablet by mouth daily.    TAKE 1 TABLET BY MOUTH ONCE DAILY  Discontinued Medications   No medications on file    Subjective: Drew Cuevas is in for his hospital follow-up visit.  When he woke up on 01/02/2021 he immediately noticed that he was not feeling well.  When he tried to sit up he fell to his left side.  He was taken to the emergency department and admitted.  He was found to have an acute cerebellar CVA and asymptomatic COVID-19 infection (he had had his first COVID booster vaccine last November).  He was discharged 3 days later.  He remains weak, especially in his left arm but he is getting better.  He has physical therapy 3 times weekly.  He is working hard on cutting down on his cigarettes.  He says that he is only smoked 3 cigarettes this week.  He hopes to be able to quit soon.  He is using nicotine patches.  He sees his PCP next week.  He denies any problems obtaining, taking or tolerating his Triumeq.  He thinks the only dose that he has missed since his last visit was the day he was admitted to the hospital.  Review of Systems: Review of Systems  Constitutional:  Positive for malaise/fatigue. Negative for fever and weight loss.  Respiratory:  Negative for cough.   Cardiovascular:  Negative for chest pain.  Gastrointestinal:  Negative for abdominal pain, diarrhea, nausea and vomiting.  Neurological:  Positive for sensory change and focal weakness. Negative for speech change and headaches.   Past Medical History:  Diagnosis Date   Bronchitis    LAST FLARE UP WAS NEW YEARS 2015   Cancer [facility])    kidney   GERD (gastroesophageal reflux disease)    NO MEDS   HIV (human immunodeficiency virus infection) ([place])    UNDER CONTROL WITH MEDICATIONS   Hypertension    Immune deficiency disorder ([place])    Renal insufficiency 05/17/2015   Renal mass, right     Social History   Tobacco Use   Smoking status: Every Day    Packs/day: 0.50     Years: 30.00    Pack years: 15.00    Types: Cigarettes    Last attempt to quit: 11/28/2015    Years since quitting: 5.1   Smokeless tobacco: Never   Tobacco comments:    stopped May 2017  Vaping Use   Vaping Use: Never used  Substance Use Topics   Alcohol use: No    Comment: stopped 10-12 years ago   Drug use: No    Family History  Problem Relation Age of Onset   Hypertension Mother    Hypertension Sister    Hypertension Brother       Health Maintenance  Topic Date Due   Pneumococcal Vaccine 71-62 Years old (1 - PCV) Never done   FOOT EXAM  Never done   OPHTHALMOLOGY EXAM  Never done   URINE MICROALBUMIN  Never done   COLONOSCOPY (Pts 45-33yrs Insurance coverage will need to be confirmed)  Never done   COVID-19 Vaccine (4 - Booster for Pfizer series) 09/26/2020   INFLUENZA VACCINE  02/27/2021   HEMOGLOBIN A1C  07/06/2021   TETANUS/TDAP  07/24/2027   PNEUMOCOCCAL POLYSACCHARIDE VACCINE AGE 25-64 HIGH RISK  Completed   Hepatitis C Screening  Completed   HIV Screening  Completed   HPV VACCINES  Aged Out    Objective:  Vitals:   01/18/21 1128  BP: 123/86  Pulse: 70  Temp: 98.5 F (36.9 C)  TempSrc: Oral  Weight: 229 lb (103.9 kg)   Body mass index is 36.96 kg/m.  Physical Exam Constitutional:      Comments: He is quiet as usual but his spirits are good.  Cardiovascular:     Rate and Rhythm: Normal rate and regular rhythm.     Heart sounds: No murmur heard. Pulmonary:     Effort: Pulmonary effort is normal.     Breath sounds: Normal breath sounds.  Abdominal:     Palpations: Abdomen is soft.     Tenderness: There is no abdominal tenderness.  Skin:    Findings: No rash.  Neurological:     Motor: Weakness present.     Comments: He has some weakness in his left side, greater in his arms and in his leg.  He walks with the aid of a cane.  Psychiatric:        Mood and Affect: Mood normal.    Lab Results Lab Results  Component Value Date   WBC 7.5  01/05/2021   HGB 15.5 01/05/2021   HCT 45.1 01/05/2021   MCV 96.2 01/05/2021   PLT 181 01/05/2021    Lab Results  Component Value Date   CREATININE  1.69 (H) 01/05/2021   BUN 15 01/05/2021   NA 138 01/05/2021   K 4.3 01/05/2021   CL 103 01/05/2021   CO2 29 01/05/2021    Lab Results  Component Value Date   ALT 30 01/05/2021   AST 29 01/05/2021   ALKPHOS 77 01/05/2021   BILITOT 0.6 01/05/2021    Lab Results  Component Value Date   CHOL 198 01/04/2021   HDL 29 (L) 01/04/2021   LDLCALC 134 (H) 01/04/2021   TRIG 174 (H) 01/04/2021   CHOLHDL 6.8 01/04/2021   Lab Results  Component Value Date   LABRPR NON-REACTIVE 06/07/2020   HIV 1 RNA Quant  Date Value  06/07/2020 <20 Copies/mL  05/19/2019 <20 DETECTED copies/mL (A)  04/09/2018 42 copies/mL (H)   CD4 T Cell Abs (/uL)  Date Value  06/07/2020 934  05/19/2019 953  04/09/2018 1,130     Problem List Items Addressed This Visit       High   Human immunodeficiency virus (HIV) disease ([place])    His infection has been under excellent, long-term control.  His adherence is near perfect and I suspect that his viral load remains undetectable.  He will get a repeat viral load today and follow-up in 6 months.  He will continue Triumeq.       Relevant Medications   abacavir-dolutegravir-lamiVUDine (TRIUMEQ) 600-50-300 MG tablet   Other Relevant Orders   HIV-1 RNA quant-no reflex-bld   Cerebellar stroke ([place])    Fortunately, he is improving steadily with time and physical therapy.  Encouraged him to make every effort to quit smoking cigarettes and work with his PCP to manage any other risk factors for future strokes.       COVID-19 virus infection    He had breakthrough COVID infection but fortunately was asymptomatic.       Relevant Medications   abacavir-dolutegravir-lamiVUDine (TRIUMEQ) 407-68-088 MG tablet      Michel Bickers, MD [facility] [place] [phone]  pager   [phone] cell 01/18/2021, 11:53 AM

## 2021-01-18 NOTE — Assessment & Plan Note (Signed)
His infection has been under excellent, long-term control.  His adherence is near perfect and I suspect that his viral load remains undetectable.  He will get a repeat viral load today and follow-up in 6 months.  He will continue Triumeq.

## 2021-01-18 NOTE — Assessment & Plan Note (Addendum)
Fortunately, he is improving steadily with time and physical therapy.  Encouraged him to make every effort to quit smoking cigarettes and work with his PCP to manage any other risk factors for future strokes.

## 2021-01-18 NOTE — Assessment & Plan Note (Signed)
He had breakthrough COVID infection but fortunately was asymptomatic.

## 2021-01-19 ENCOUNTER — Other Ambulatory Visit: Payer: Self-pay

## 2021-01-19 ENCOUNTER — Ambulatory Visit: Payer: 59 | Admitting: Physical Therapy

## 2021-01-19 DIAGNOSIS — R278 Other lack of coordination: Secondary | ICD-10-CM | POA: Diagnosis not present

## 2021-01-19 DIAGNOSIS — R2689 Other abnormalities of gait and mobility: Secondary | ICD-10-CM

## 2021-01-19 DIAGNOSIS — R2681 Unsteadiness on feet: Secondary | ICD-10-CM

## 2021-01-19 NOTE — Therapy (Signed)
[facility] [address] [place], [place], [address] Phone: [phone]   Fax:  [phone]  Physical Therapy Treatment  Patient Details  Name: Drew Cuevas MRN: [id number] Date of Birth: 11-02-1970 Referring Provider (PT): Referred by hospitalist; will send to PCP: Ladell Pier, MD   Encounter Date: 01/19/2021   PT End of Session - 01/19/21 1150     Visit Number 4    Number of Visits 17    Date for PT Re-Evaluation 03/13/21    Authorization Type [employer]; 30 VL - authorization required after 20th visit    Authorization - Visit Number 4    Authorization - Number of Visits 20    PT Start Time 8786    PT Stop Time 1145    PT Time Calculation (min) 40 min    Equipment Utilized During Treatment Gait belt    Activity Tolerance Patient tolerated treatment well;Patient limited by fatigue    Behavior During Therapy [facility] for tasks assessed/performed             Past Medical History:  Diagnosis Date   Bronchitis    LAST FLARE UP WAS NEW YEARS 2015   Cancer ([place])    kidney   GERD (gastroesophageal reflux disease)    NO MEDS   HIV (human immunodeficiency virus infection) ([place])    UNDER CONTROL WITH MEDICATIONS   Hypertension    Immune deficiency disorder ([place])    Renal insufficiency 05/17/2015   Renal mass, right     Past Surgical History:  Procedure Laterality Date   NO PAST SURGERIES     ROBOT ASSISTED LAPAROSCOPIC NEPHRECTOMY Right 08/19/2013   Procedure: ROBOTIC ASSISTED LAPAROSCOPIC RIGHT PARTIAL NEPHRECTOMY;  Surgeon: Ardis Hughs, MD;  Location: [facility];  Service: Urology;  Laterality: Right;    There were no vitals filed for this visit.   Subjective Assessment - 01/19/21 1147     Subjective Pt states he is to return to work next week. Pt will be doing 8 hour days but on light duty. Pt states he just woke up    Pertinent History Kidney CA, HIV, bronchitis, HTN, renal insufficiency,  nephrectomy    Limitations Standing;Walking    Patient Stated Goals Walking better, walking without the cane    Currently in Pain? No/denies                               [facility] Adult PT Treatment/Exercise - 01/19/21 0001       Ambulation/Gait   Ambulation Distance (Feet) 330 Feet    Assistive device None    Gait Pattern Step-through pattern;Decreased stance time - left;Decreased stride length;Decreased weight shift to left;Poor foot clearance - left;Decreased arm swing - left;Decreased trunk rotation    Ambulation Surface Level;Indoor    Gait Comments Cues for arm swing, L LE weightshift, heel strike      Exercises   Exercises Knee/Hip      Knee/Hip Exercises: Standing   Forward Step Up Both;1 set;10 reps;Hand Hold: 1;Step Height: 6"    Forward Step Up Limitations Runner's step up      Knee/Hip Exercises: Supine   Other Supine Knee/Hip Exercises D1 and D2 PNF L LE 2x10             Vestibular Treatment/Exercise - 01/19/21 0001       Eye/Head Exercise Horizontal   Foot Position Standing    Time --  Time 8    Period Weeks    Status New    Target Date 03/13/21      PT LONG TERM GOAL #4   Title Pt will negotiate 4 stairs with 1 rail, alternating sequence MOD I (no cane) to improve safety with home entry/exit    Time 8    Period Weeks    Status New    Target Date 03/13/21      PT LONG TERM GOAL #5   Title Pt will demonstrate ability to perform functional bimanual task safely and independently without having to look at LUE  when performing    Time 8    Period Weeks    Status New    Target Date 03/13/21                   Plan - 01/19/21 1148     Clinical Impression Statement Treatment focused on continuing to improve L LE coordination, strength, stability and gait. Pt limited due to decreased endurance/L LE fatigue. Pt with improving gait pattern -- pt utilizing narrower BOS and improving symmetrical step length. No dizziness noted with saccadic eye movement in standing today. Increased unsteadiness noted ambulating with head nods.    Personal Factors and Comorbidities Comorbidity 3+;Profession    Comorbidities Kidney CA, HIV, bronchitis, HTN, renal insufficiency, nephrectomy    Examination-Activity Limitations Bend;Carry;Dressing;Lift;Locomotion Level;Reach Overhead;Stairs    Examination-Participation Restrictions Community Activity;Driving;Occupation    Stability/Clinical Decision Making Evolving/Moderate complexity    Rehab Potential Good    PT Frequency 3x / week    PT Duration 8 weeks    PT Treatment/Interventions ADLs/Self Care Home Management;Gait training;Stair training;Functional mobility training;Therapeutic activities;Therapeutic exercise;Balance training;Neuromuscular re-education;Patient/family education;Orthotic Fit/Training;Vestibular;Visual/perceptual remediation/compensation    PT Next Visit Plan 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  Continue to work on functional gait, increased stance on LLE, step length.  Use of LUE when ambulating (carrying, reaching, etc).  Safe stair negotiation.  Visual exercises: saccades and VOR cancllation    Consulted and Agree with Plan of Care Patient             Patient will benefit from skilled therapeutic intervention in order to improve the following deficits and impairments:  Abnormal gait, Decreased activity tolerance, Decreased balance, Decreased coordination, Difficulty walking, Impaired UE functional use, Impaired  vision/preception  Visit Diagnosis: Other lack of coordination  Other abnormalities of gait and mobility  Unsteadiness on feet     Problem List Patient Active Problem List   Diagnosis Date Noted   Cerebellar stroke ([place]) 01/03/2021   COVID-19 virus infection 01/03/2021   Diverticulitis of large intestine with abscess 09/27/2020   Depression 06/19/2019   Other insomnia 06/19/2019   Chronic kidney disease, stage 3a ([place]) 04/15/2018   Tobacco dependence 01/13/2018   Primary osteoarthritis of right knee 04/03/2017   Dyslipidemia 06/14/2016   Renal cell carcinoma of right kidney ([place]) 08/19/2013   Renal cyst 05/21/2013   Obesity (BMI 30-39.9) 05/26/2012   Internal hemorrhoids 12/01/2008   Hypertension 04/28/2008   Major depressive disorder, single episode, severe ([place]) 12/20/2006   Human immunodeficiency virus (HIV) disease ([place]) 08/28/2006    Maryuri Warnke April Ma L Quiera Diffee PT, DPT 01/19/2021, 12:58 PM  [facility] [address] [place], [place], [address] Phone: [phone]   Fax:  [phone]  Name: Belvin Gauss MRN: [id number] Date of Birth: [birth year]  Time 8    Period Weeks    Status New    Target Date 03/13/21      PT LONG TERM GOAL #4   Title Pt will negotiate 4 stairs with 1 rail, alternating sequence MOD I (no cane) to improve safety with home entry/exit    Time 8    Period Weeks    Status New    Target Date 03/13/21      PT LONG TERM GOAL #5   Title Pt will demonstrate ability to perform functional bimanual task safely and independently without having to look at LUE  when performing    Time 8    Period Weeks    Status New    Target Date 03/13/21                   Plan - 01/19/21 1148     Clinical Impression Statement Treatment focused on continuing to improve L LE coordination, strength, stability and gait. Pt limited due to decreased endurance/L LE fatigue. Pt with improving gait pattern -- pt utilizing narrower BOS and improving symmetrical step length. No dizziness noted with saccadic eye movement in standing today. Increased unsteadiness noted ambulating with head nods.    Personal Factors and Comorbidities Comorbidity 3+;Profession    Comorbidities Kidney CA, HIV, bronchitis, HTN, renal insufficiency, nephrectomy    Examination-Activity Limitations Bend;Carry;Dressing;Lift;Locomotion Level;Reach Overhead;Stairs    Examination-Participation Restrictions Community Activity;Driving;Occupation    Stability/Clinical Decision Making Evolving/Moderate complexity    Rehab Potential Good    PT Frequency 3x / week    PT Duration 8 weeks    PT Treatment/Interventions ADLs/Self Care Home Management;Gait training;Stair training;Functional mobility training;Therapeutic activities;Therapeutic exercise;Balance training;Neuromuscular re-education;Patient/family education;Orthotic Fit/Training;Vestibular;Visual/perceptual remediation/compensation    PT Next Visit Plan 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  Continue to work on functional gait, increased stance on LLE, step length.  Use of LUE when ambulating (carrying, reaching, etc).  Safe stair negotiation.  Visual exercises: saccades and VOR cancllation    Consulted and Agree with Plan of Care Patient             Patient will benefit from skilled therapeutic intervention in order to improve the following deficits and impairments:  Abnormal gait, Decreased activity tolerance, Decreased balance, Decreased coordination, Difficulty walking, Impaired UE functional use, Impaired  vision/preception  Visit Diagnosis: Other lack of coordination  Other abnormalities of gait and mobility  Unsteadiness on feet     Problem List Patient Active Problem List   Diagnosis Date Noted   Cerebellar stroke ([place]) 01/03/2021   COVID-19 virus infection 01/03/2021   Diverticulitis of large intestine with abscess 09/27/2020   Depression 06/19/2019   Other insomnia 06/19/2019   Chronic kidney disease, stage 3a ([place]) 04/15/2018   Tobacco dependence 01/13/2018   Primary osteoarthritis of right knee 04/03/2017   Dyslipidemia 06/14/2016   Renal cell carcinoma of right kidney ([place]) 08/19/2013   Renal cyst 05/21/2013   Obesity (BMI 30-39.9) 05/26/2012   Internal hemorrhoids 12/01/2008   Hypertension 04/28/2008   Major depressive disorder, single episode, severe ([place]) 12/20/2006   Human immunodeficiency virus (HIV) disease ([place]) 08/28/2006    Maryuri Warnke April Ma L Quiera Diffee PT, DPT 01/19/2021, 12:58 PM  [facility] [address] [place], [place], [address] Phone: [phone]   Fax:  [phone]  Name: Belvin Gauss MRN: [id number] Date of Birth: [birth year]

## 2021-01-20 ENCOUNTER — Ambulatory Visit: Payer: 59 | Admitting: Pharmacist

## 2021-01-20 ENCOUNTER — Ambulatory Visit: Payer: 59 | Admitting: Physical Therapy

## 2021-01-20 DIAGNOSIS — R2689 Other abnormalities of gait and mobility: Secondary | ICD-10-CM

## 2021-01-20 DIAGNOSIS — R2681 Unsteadiness on feet: Secondary | ICD-10-CM

## 2021-01-20 DIAGNOSIS — R278 Other lack of coordination: Secondary | ICD-10-CM

## 2021-01-20 NOTE — Therapy (Signed)
[facility] [address] [place], [place], [address] Phone: [phone]   Fax:  [phone]  Physical Therapy Treatment  Patient Details  Name: Drew Cuevas MRN: [id number] Date of Birth: 10/23/1970 Referring Provider (PT): Referred by hospitalist; will send to PCP: Marcine Matar, MD   Encounter Date: 01/20/2021   PT End of Session - 01/20/21 1151     Visit Number 5    Number of Visits 17    Date for PT Re-Evaluation 03/13/21    Authorization Type [employer]; 30 VL - authorization required after 20th visit    Authorization - Visit Number 5    Authorization - Number of Visits 20    PT Start Time 1100    PT Stop Time 1145    PT Time Calculation (min) 45 min    Equipment Utilized During Treatment Gait belt    Activity Tolerance Patient tolerated treatment well;Patient limited by fatigue    Behavior During Therapy [facility] for tasks assessed/performed             Past Medical History:  Diagnosis Date   Bronchitis    LAST FLARE UP WAS NEW YEARS 2015   Cancer (HCC)    kidney   GERD (gastroesophageal reflux disease)    NO MEDS   HIV (human immunodeficiency virus infection) (HCC)    UNDER CONTROL WITH MEDICATIONS   Hypertension    Immune deficiency disorder (HCC)    Renal insufficiency 05/17/2015   Renal mass, right     Past Surgical History:  Procedure Laterality Date   NO PAST SURGERIES     ROBOT ASSISTED LAPAROSCOPIC NEPHRECTOMY Right 08/19/2013   Procedure: ROBOTIC ASSISTED LAPAROSCOPIC RIGHT PARTIAL NEPHRECTOMY;  Surgeon: Crist Fat, MD;  Location: [facility];  Service: Urology;  Laterality: Right;    There were no vitals filed for this visit.   Subjective Assessment - 01/20/21 1102     Subjective Pt excited to return to work. A little sore from last session. Nothing new to report.    Pertinent History Kidney CA, HIV, bronchitis, HTN, renal insufficiency, nephrectomy    Limitations  Standing;Walking    Patient Stated Goals Walking better, walking without the cane                               [facility] Adult PT Treatment/Exercise - 01/20/21 0001       Ambulation/Gait   Ambulation Distance (Feet) 330 Feet    Assistive device None    Gait Pattern Step-through pattern;Decreased weight shift to left;Decreased arm swing - left;Decreased trunk rotation;Lateral trunk lean to right;Poor foot clearance - left   Mild L hip ER during L LE swing phase   Ambulation Surface Level;Indoor    Gait Comments Improved L LE swing after cues for toe off and knee flexion      Knee/Hip Exercises: Standing   Other Standing Knee Exercises Palloff press 3x10 green tbnd      Knee/Hip Exercises: Seated   Other Seated Knee/Hip Exercises On bosu ball (blue side up): hip flexion 2x10 alternating      Knee/Hip Exercises: Supine   Straight Leg Raises Strengthening;Left;3 sets;10 reps    Other Supine Knee/Hip Exercises AROM D1 and D2 PNF L LE 2x10    Other Supine Knee/Hip Exercises Marching with PPT 3x10      Knee/Hip Exercises: Sidelying   Clams 3x10 green tband  Balance Exercises - 01/20/21 0001       Balance Exercises: Standing   SLS with Vectors Solid surface   2x10 tap each on cone forward, forward/cross, and to the side for R & L LE   Tandem Gait Forward;Intermittent upper extremity support;3 reps   x10'                PT Short Term Goals - 01/12/21 1649       PT SHORT TERM GOAL #1   Title Pt will demonstrate independence with initial HEP    Time 4    Period Weeks    Status New    Target Date 02/11/21      PT SHORT TERM GOAL #2   Title Pt will improve five time sit to stand to </= 13.5 seconds with no anterior LOB upon rising    Baseline 14 seconds with anterior LOB when standing    Time 4    Period Weeks    Status New    Target Date 02/11/21      PT SHORT TERM GOAL #3   Title Pt will improve gait velocity without cane  to >/= 2.62 ft/sec    Baseline 2.05 ft/sec without cane    Time 4    Period Weeks    Status New    Target Date 02/11/21      PT SHORT TERM GOAL #4   Title Pt will increase DGI score by 4 points to indicate decreased falls risk    Baseline 13/24    Time 4    Period Weeks    Status New    Target Date 02/11/21      PT SHORT TERM GOAL #5   Title Pt will negotiate 4 stairs with one rail (R to ascend) with alternating sequence and min A (no cane)    Time 4    Period Weeks    Status New    Target Date 02/11/21               PT Long Term Goals - 01/12/21 1654       PT LONG TERM GOAL #1   Title Pt will demonstrate independence with final HEP    Time 8    Period Weeks    Status New    Target Date 03/13/21      PT LONG TERM GOAL #2   Title Pt will increase gait velocity without cane to >/= 3.0 ft/sec with increased L arm swing    Time 8    Period Weeks    Status New    Target Date 03/13/21      PT LONG TERM GOAL #3   Title Pt will increase DGI score to >/= 20/24 without use of cane    Time 8    Period Weeks    Status New    Target Date 03/13/21      PT LONG TERM GOAL #4   Title Pt will negotiate 4 stairs with 1 rail, alternating sequence MOD I (no cane) to improve safety with home entry/exit    Time 8    Period Weeks    Status New    Target Date 03/13/21      PT LONG TERM GOAL #5   Title Pt will demonstrate ability to perform functional bimanual task safely and independently without having to look at LUE when performing    Time 8    Period Weeks    Status New  Target Date 03/13/21                   Plan - 01/20/21 1149     Clinical Impression Statement Focused on improving L LE coordination, strength, and dynamic gait. Initiated core/trunk strengthening as pt tends to perform mild R lateral trunk lean during gait for increased L LE step clearance. Did not feel safe strengthening core on therapy ball; worked on [employer] instead. Pt with decreased hip  flexion strength -- worked in standing and sitting to increase.    Personal Factors and Comorbidities Comorbidity 3+;Profession    Comorbidities Kidney CA, HIV, bronchitis, HTN, renal insufficiency, nephrectomy    Examination-Activity Limitations Bend;Carry;Dressing;Lift;Locomotion Level;Reach Overhead;Stairs    Examination-Participation Restrictions Community Activity;Driving;Occupation    Stability/Clinical Decision Making Evolving/Moderate complexity    Rehab Potential Good    PT Frequency 3x / week    PT Duration 8 weeks    PT Treatment/Interventions ADLs/Self Care Home Management;Gait training;Stair training;Functional mobility training;Therapeutic activities;Therapeutic exercise;Balance training;Neuromuscular re-education;Patient/family education;Orthotic Fit/Training;Vestibular;Visual/perceptual remediation/compensation    PT Next Visit Plan Pt is a Production designer, 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 Continue to work on functional gait, increased stance on LLE, step length.  Use of LUE when ambulating (carrying, reaching, etc).  Safe stair negotiation.  Visual exercises: saccades and VOR cancllation    Consulted and Agree with Plan of Care Patient             Patient will benefit from skilled therapeutic intervention in order to improve the following deficits and impairments:  Abnormal gait, Decreased activity tolerance, Decreased balance, Decreased coordination, Difficulty walking, Impaired UE functional use, Impaired vision/preception  Visit Diagnosis: Other lack of coordination  Other abnormalities of gait and mobility  Unsteadiness on feet     Problem List Patient Active Problem List   Diagnosis Date Noted   Cerebellar stroke (HCC) 01/03/2021   COVID-19 virus infection 01/03/2021   Diverticulitis of large intestine with abscess 09/27/2020   Depression 06/19/2019   Other insomnia 06/19/2019   Chronic kidney disease, stage 3a (HCC) 04/15/2018   Tobacco dependence 01/13/2018    Primary osteoarthritis of right knee 04/03/2017   Dyslipidemia 06/14/2016   Renal cell carcinoma of right kidney (HCC) 08/19/2013   Renal cyst 05/21/2013   Obesity (BMI 30-39.9) 05/26/2012   Internal hemorrhoids 12/01/2008   Hypertension 04/28/2008   Major depressive disorder, single episode, severe (HCC) 12/20/2006   Human immunodeficiency virus (HIV) disease (HCC) 08/28/2006    Louvenia Golomb April Ma L Tenisha Fleece PT, DPT 01/20/2021, 11:53 AM  [facility] [address] [place], [place], [address] Phone: [phone]   Fax:  [phone]  Name: Drew Cuevas MRN: [id number] Date of Birth: May 07, 1971

## 2021-01-21 LAB — HIV-1 RNA QUANT-NO REFLEX-BLD
HIV 1 RNA Quant: NOT DETECTED Copies/mL
HIV-1 RNA Quant, Log: NOT DETECTED Log cps/mL

## 2021-01-23 ENCOUNTER — Ambulatory Visit: Payer: 59 | Admitting: Physical Therapy

## 2021-01-23 ENCOUNTER — Telehealth: Payer: Self-pay

## 2021-01-23 NOTE — Telephone Encounter (Signed)
Contacted pt to make aware that parking placard is ready for pick up lvm

## 2021-01-25 ENCOUNTER — Encounter: Payer: Self-pay | Admitting: Physical Therapy

## 2021-01-25 ENCOUNTER — Other Ambulatory Visit: Payer: Self-pay

## 2021-01-25 ENCOUNTER — Ambulatory Visit: Payer: 59 | Admitting: Physical Therapy

## 2021-01-25 DIAGNOSIS — R278 Other lack of coordination: Secondary | ICD-10-CM

## 2021-01-25 DIAGNOSIS — R2689 Other abnormalities of gait and mobility: Secondary | ICD-10-CM

## 2021-01-25 DIAGNOSIS — R2681 Unsteadiness on feet: Secondary | ICD-10-CM

## 2021-01-26 ENCOUNTER — Ambulatory Visit: Payer: 59 | Attending: Internal Medicine | Admitting: Internal Medicine

## 2021-01-26 ENCOUNTER — Encounter: Payer: Self-pay | Admitting: Internal Medicine

## 2021-01-26 VITALS — BP 133/93 | HR 91 | Resp 16 | Wt 234.4 lb

## 2021-01-26 DIAGNOSIS — N1831 Chronic kidney disease, stage 3a: Secondary | ICD-10-CM | POA: Diagnosis not present

## 2021-01-26 DIAGNOSIS — Z09 Encounter for follow-up examination after completed treatment for conditions other than malignant neoplasm: Secondary | ICD-10-CM | POA: Diagnosis not present

## 2021-01-26 DIAGNOSIS — Z8673 Personal history of transient ischemic attack (TIA), and cerebral infarction without residual deficits: Secondary | ICD-10-CM

## 2021-01-26 DIAGNOSIS — F17210 Nicotine dependence, cigarettes, uncomplicated: Secondary | ICD-10-CM

## 2021-01-26 DIAGNOSIS — I10 Essential (primary) hypertension: Secondary | ICD-10-CM | POA: Diagnosis not present

## 2021-01-26 DIAGNOSIS — F17200 Nicotine dependence, unspecified, uncomplicated: Secondary | ICD-10-CM

## 2021-01-26 MED ORDER — CARVEDILOL 3.125 MG PO TABS: 3.1250 mg | ORAL_TABLET | Freq: Two times a day (BID) | ORAL | 3 refills | Status: DC

## 2021-01-26 MED ORDER — CLOPIDOGREL BISULFATE 75 MG PO TABS: 75.0000 mg | ORAL_TABLET | Freq: Every day | ORAL | 3 refills | Status: DC

## 2021-01-26 NOTE — Patient Instructions (Signed)
Your blood pressure is not at goal with goal being 130/80 or lower.  We have added a low-dose of a blood pressure medicine called carvedilol that you will take twice a day.  Follow-up with the clinical pharmacist in 2 weeks for repeat blood pressure check.

## 2021-01-26 NOTE — Therapy (Signed)
[facility] [facility] [address] [place], [place], [address] Phone: [phone]   Fax:  [phone]  Physical Therapy Treatment  Patient Details  Name: Drew Cuevas MRN: [id number] Date of Birth: 12-Jun-1971 Referring Provider (PT): Referred by hospitalist; will send to PCP: Ladell Pier, MD   Encounter Date: 01/25/2021   PT End of Session - 01/25/21 1110     Visit Number 6    Number of Visits 17    Date for PT Re-Evaluation 03/13/21    Authorization Type [employer]; 30 VL - authorization required after 20th visit    Authorization - Visit Number 6    Authorization - Number of Visits 20    PT Start Time 1104    PT Stop Time 1145    PT Time Calculation (min) 41 min    Equipment Utilized During Treatment Gait belt    Activity Tolerance Patient tolerated treatment well;Patient limited by fatigue    Behavior During Therapy [facility] for tasks assessed/performed             Past Medical History:  Diagnosis Date   Bronchitis    LAST FLARE UP WAS NEW YEARS 2015   Cancer ([place])    kidney   GERD (gastroesophageal reflux disease)    NO MEDS   HIV (human immunodeficiency virus infection) ([place])    UNDER CONTROL WITH MEDICATIONS   Hypertension    Immune deficiency disorder ([place])    Renal insufficiency 05/17/2015   Renal mass, right     Past Surgical History:  Procedure Laterality Date   NO PAST SURGERIES     ROBOT ASSISTED LAPAROSCOPIC NEPHRECTOMY Right 08/19/2013   Procedure: ROBOTIC ASSISTED LAPAROSCOPIC RIGHT PARTIAL NEPHRECTOMY;  Surgeon: Ardis Hughs, MD;  Location: [facility];  Service: Urology;  Laterality: Right;    There were no vitals filed for this visit.   Subjective Assessment - 01/25/21 1108     Subjective No new complaints. No falls. Returns to work on 02/01/21.    Pertinent History Kidney CA, HIV, bronchitis, HTN, renal insufficiency, nephrectomy    Limitations Standing;Walking    Patient Stated  Goals Walking better, walking without the cane    Currently in Pain? Yes    Pain Score 1     Pain Location Arm    Pain Orientation Left    Pain Descriptors / Indicators Aching    Pain Type Chronic pain    Pain Onset More than a month ago    Pain Frequency Intermittent    Aggravating Factors  with lifting his arm    Pain Relieving Factors doesn't take or do anything for it                     [facility] Adult PT Treatment/Exercise - 01/25/21 1112       Transfers   Transfers Sit to Stand;Stand to Sit    Sit to Stand 5: Supervision    Stand to Sit 5: Supervision      Ambulation/Gait   Ambulation/Gait Yes    Ambulation/Gait Assistance 5: Supervision;4: Min guard    Ambulation/Gait Assistance Details pt with increased toe scuffing with decreased stance time on left LE as gait progressed.    Ambulation Distance (Feet) 500 Feet   x1, plus around clinic with session   Assistive device None    Gait Pattern Step-through pattern;Decreased weight shift to left;Decreased arm swing - left;Decreased trunk rotation;Lateral trunk lean to right;Poor foot clearance - left  Ambulation Surface Level;Indoor;Unlevel;Outdoor;Paved;Gravel;Grass      Neuro Re-ed    Neuro Re-ed Details  for balance/NMR: gait around track working on speed changes, scanning all directions randomly with min guard assist, no balance issues noted.                 Balance Exercises - 01/25/21 1124       Balance Exercises: Standing   Rockerboard Anterior/posterior;Lateral;Head turns;EO;EC;30 seconds;Other reps (comment);Limitations    Rockerboard Limitations performed both ways on balance board with no UE support: rocking the board with emphasis on tall posture with EO, progressing to EC with min guard to min assist, increased assistance with vision removed. no UE support with occasional touch to bars for balance; then holding the board steady for EC 30 sec's x 3 reps, progressing to EO for head movements  left<>right, up<>down for ~10 reps each. min guard to min assist with no UE support, occasional touch to bars. cues on posture and weight shifting through out to assist with balance.    Sit to Stand Standard surface;Without upper extremity support;Foam/compliant surface;Limitations    Sit to Stand Limitations seated at [facility] with right foot forward on foam bubble, left foot back for sit<>stand for 10 reps, arms across chest. min guard assist for balance.                 PT Short Term Goals - 01/12/21 1649       PT SHORT TERM GOAL #1   Title Pt will demonstrate independence with initial HEP    Time 4    Period Weeks    Status New    Target Date 02/11/21      PT SHORT TERM GOAL #2   Title Pt will improve five time sit to stand to </= 13.5 seconds with no anterior LOB upon rising    Baseline 14 seconds with anterior LOB when standing    Time 4    Period Weeks    Status New    Target Date 02/11/21      PT SHORT TERM GOAL #3   Title Pt will improve gait velocity without cane to >/= 2.62 ft/sec    Baseline 2.05 ft/sec without cane    Time 4    Period Weeks    Status New    Target Date 02/11/21      PT SHORT TERM GOAL #4   Title Pt will increase DGI score by 4 points to indicate decreased falls risk    Baseline 13/24    Time 4    Period Weeks    Status New    Target Date 02/11/21      PT SHORT TERM GOAL #5   Title Pt will negotiate 4 stairs with one rail (R to ascend) with alternating sequence and min A (no cane)    Time 4    Period Weeks    Status New    Target Date 02/11/21               PT Long Term Goals - 01/12/21 1654       PT LONG TERM GOAL #1   Title Pt will demonstrate independence with final HEP    Time 8    Period Weeks    Status New    Target Date 03/13/21      PT LONG TERM GOAL #2   Title Pt will increase gait velocity without cane to >/= 3.0 ft/sec with increased L arm swing  Time 8    Period Weeks    Status New    Target Date  03/13/21      PT LONG TERM GOAL #3   Title Pt will increase DGI score to >/= 20/24 without use of cane    Time 8    Period Weeks    Status New    Target Date 03/13/21      PT LONG TERM GOAL #4   Title Pt will negotiate 4 stairs with 1 rail, alternating sequence MOD I (no cane) to improve safety with home entry/exit    Time 8    Period Weeks    Status New    Target Date 03/13/21      PT LONG TERM GOAL #5   Title Pt will demonstrate ability to perform functional bimanual task safely and independently without having to look at LUE when performing    Time 8    Period Weeks    Status New    Target Date 03/13/21                   Plan - 01/25/21 1111     Clinical Impression Statement Today's skilled session focused on gait on various surfaces with decreased stance on left noted as pt fatigued. Also continued to address dynamic gait and balance training with no issues noted or reported by patient. No longer having double vision or dizziness per pt's report with none occuring in session. The pt is making steady progress and should benefit from continued PT to progress toward unmet goals.    Personal Factors and Comorbidities Comorbidity 3+;Profession    Comorbidities Kidney CA, HIV, bronchitis, HTN, renal insufficiency, nephrectomy    Examination-Activity Limitations Bend;Carry;Dressing;Lift;Locomotion Level;Reach Overhead;Stairs    Examination-Participation Restrictions Community Activity;Driving;Occupation    Stability/Clinical Decision Making Evolving/Moderate complexity    Rehab Potential Good    PT Frequency 3x / week    PT Duration 8 weeks    PT Treatment/Interventions ADLs/Self Care Home Management;Gait training;Stair training;Functional mobility training;Therapeutic activities;Therapeutic exercise;Balance training;Neuromuscular re-education;Patient/family education;Orthotic Fit/Training;Vestibular;Visual/perceptual remediation/compensation    PT Next Visit Plan Continue  to work on functional gait, increased stance on LLE, step length.  Use of LUE when ambulating (carrying, reaching, etc).  Safe stair negotiation.  Returning to work with modified work load- will mostly be expiditing the [employer] and paperwork associated with managing. does report he can sit on a stool as needed in the kitchen.    Consulted and Agree with Plan of Care Patient             Patient will benefit from skilled therapeutic intervention in order to improve the following deficits and impairments:  Abnormal gait, Decreased activity tolerance, Decreased balance, Decreased coordination, Difficulty walking, Impaired UE functional use, Impaired vision/preception  Visit Diagnosis: Other lack of coordination  Other abnormalities of gait and mobility  Unsteadiness on feet     Problem List Patient Active Problem List   Diagnosis Date Noted   Cerebellar stroke ([place]) 01/03/2021   COVID-19 virus infection 01/03/2021   Diverticulitis of large intestine with abscess 09/27/2020   Depression 06/19/2019   Other insomnia 06/19/2019   Chronic kidney disease, stage 3a ([place]) 04/15/2018   Tobacco dependence 01/13/2018   Primary osteoarthritis of right knee 04/03/2017   Dyslipidemia 06/14/2016   Renal cell carcinoma of right kidney ([place]) 08/19/2013   Renal cyst 05/21/2013   Obesity (BMI 30-39.9) 05/26/2012   Internal hemorrhoids 12/01/2008   Hypertension 04/28/2008   Major depressive  disorder, single episode, severe ([place]) 12/20/2006   Human immunodeficiency virus (HIV) disease ([place]) 08/28/2006    Willow Ora, PTA, [facility] [address], [place], [place] [address] [phone] 01/26/21, 3:26 PM   Name: Kapono Luhn MRN: [id number] Date of Birth: 1971/05/13

## 2021-01-26 NOTE — Progress Notes (Addendum)
Patient ID: Ebb Carelock, male    DOB: Jun 24, 1971  MRN: [id number]  CC: Hospital f/u Patient hospitalized 6/6-03/2021  Subjective: Drew Cuevas is a 50 y.o. male who presents for hosp f/u His concerns today include:  HTN, HL, HIV,OA RT knee, rectal fistulectomy, Renal cell CA s/p partial nephrology, depression, CKD stage2-3, .past hx of DM cured  Patient hospitalized with vertigo, vomiting and difficulty ambulating.  He was found to have acute CVA affecting mainly his left side and asymptomatic COVID-19 infection. MRI of the brain: Acute left superior cerebral artery territory infarct, small infarct in the right cerebellum and left upper pons, small subacute infarct in the high right cortical cortex MRA brain/neck: Negative for significant stenosis Echo: EF of 60 to 65% Urine drug screen positive for opiates/cannabinoids LDL 134 A1c 5.4 Bilateral lower extremity Dopplers negative for DVT. No evidence of atrial fibrillation on telemetry and EKG.   It was felt this was an embolic stroke due to TFTDD-22 infection.  Neurology recommended Plavix, change statin to atorvastatin and physical therapy as an outpatient.  Today: CVA: He is going to physical therapy 2-3 times a week.  He is finding it helpful.  Left-sided weakness is improving.  The physical therapist recommended that he be referred to occupational therapy also to work on his left hand. -Reports compliance with taking Plavix and atorvastatin.  He requests a muscle relaxant to use as needed as he sometimes gets spasms in the left hand. He is trying to quit smoking.  He is smoked only 2 cigarettes this past week.  He is using the nicotine patches and finds it helpful.  He has 1 on today. -He works as a Freight forwarder at [employer] and would like to return to work on [employer] duty for 2 July.  Reports he would become homeless if he does not return to work at this time.Marland Kitchen  He states that he is right-hand predominant and his duties will not  include any  lifting, pushing or pulling.  If he does have to do so, he states he would use his right hand.  The majority of his duty is sit down at the desk.  He has already spoken with his manager who has agreed to have him come back on light duty.  He has not finished physical therapy as yet but plans to continue going. -He has FMLA form with him today that he would like for me to complete.  HTN: He is taking Norvasc and took it already for today. Regards to his kidney function, his GFR worsened from greater than 60 to 49.  His creatinine also worsened from 1.3 to 1.69.  He is not on any NSAIDs. Patient Active Problem List   Diagnosis Date Noted   Cerebellar stroke ([place]) 01/03/2021   COVID-19 virus infection 01/03/2021   Diverticulitis of large intestine with abscess 09/27/2020   Depression 06/19/2019   Other insomnia 06/19/2019   Chronic kidney disease, stage 3a ([place]) 04/15/2018   Tobacco dependence 01/13/2018   Primary osteoarthritis of right knee 04/03/2017   Dyslipidemia 06/14/2016   Renal cell carcinoma of right kidney ([place]) 08/19/2013   Renal cyst 05/21/2013   Obesity (BMI 30-39.9) 05/26/2012   Internal hemorrhoids 12/01/2008   Hypertension 04/28/2008   Major depressive disorder, single episode, severe ([place]) 12/20/2006   Human immunodeficiency virus (HIV) disease ([place]) 08/28/2006     Current Outpatient Medications on File Prior to Visit  Medication Sig Dispense Refill   abacavir-dolutegravir-lamiVUDine (TRIUMEQ) 600-50-300 MG tablet  Take 1 tablet by mouth daily. 30 tablet 11   acetaminophen (TYLENOL) 325 MG tablet Take 2 tablets (650 mg total) by mouth every 6 (six) hours as needed for mild pain (or Fever >/= 101).     albuterol (VENTOLIN HFA) 108 (90 Base) MCG/ACT inhaler Inhale 1-2 puffs into the lungs every 6 (six) hours as needed for wheezing or shortness of breath.     amLODipine (NORVASC) 10 MG tablet Take 1 tablet (10 mg total) by mouth daily. 30 tablet 6   atorvastatin  (LIPITOR) 80 MG tablet Take 1 tablet (80 mg total) by mouth daily. 30 tablet 0   escitalopram (LEXAPRO) 10 MG tablet TAKE 1 TABLET(10 MG) BY MOUTH DAILY (Patient not taking: Reported on 01/18/2021) 30 tablet 0   Misc. Devices MISC 1 home blood pressure monitor 1 Device 0   nicotine (NICODERM CQ - DOSED IN MG/24 HOURS) 14 mg/24hr patch Place 1 patch (14 mg total) onto the skin daily. 28 patch 1   traZODone (DESYREL) 50 MG tablet TAKE 1/2 TABLET(25 MG) BY MOUTH AT BEDTIME 15 tablet 0   No current facility-administered medications on file prior to visit.    Allergies  Allergen Reactions   Ibuprofen Anaphylaxis, Swelling and Other (See Comments)    Dehydration Swelling of the "moist areas" (throat, mouth)   Penicillins Hives    Has patient had a PCN reaction causing immediate rash, facial/tongue/throat swelling, SOB or lightheadedness with hypotension: yes Has patient had a PCN reaction causing severe rash involving mucus membranes or skin necrosis: no-- pt had hives Has patient had a PCN reaction that required hospitalization no Has patient had a PCN reaction occurring within the last 10 years: no If all of the above answers are "NO", then may proceed with Cephalosporin use.    Latex Rash    Social History   Socioeconomic History   Marital status: Single    Spouse name: Not on file   Number of children: Not on file   Years of education: Not on file   Highest education level: Not on file  Occupational History   Not on file  Tobacco Use   Smoking status: Every Day    Packs/day: 0.50    Years: 30.00    Pack years: 15.00    Types: Cigarettes    Last attempt to quit: 11/28/2015    Years since quitting: 5.1   Smokeless tobacco: Never   Tobacco comments:    stopped May 2017  Vaping Use   Vaping Use: Never used  Substance and Sexual Activity   Alcohol use: No    Comment: stopped 10-12 years ago   Drug use: No   Sexual activity: Not Currently    Partners: Male    Comment:  declined condoms - 6.22.22  Other Topics Concern   Not on file  Social History Narrative   Not on file   Social Determinants of Health   Financial Resource Strain: Not on file  Food Insecurity: Not on file  Transportation Needs: Not on file  Physical Activity: Not on file  Stress: Not on file  Social Connections: Not on file  Intimate Partner Violence: Not on file    Family History  Problem Relation Age of Onset   Hypertension Mother    Hypertension Sister    Hypertension Brother     Past Surgical History:  Procedure Laterality Date   NO PAST SURGERIES     ROBOT ASSISTED LAPAROSCOPIC NEPHRECTOMY Right 08/19/2013   Procedure: ROBOTIC  ASSISTED LAPAROSCOPIC RIGHT PARTIAL NEPHRECTOMY;  Surgeon: Ardis Hughs, MD;  Location: [facility];  Service: Urology;  Laterality: Right;    ROS: Review of Systems Negative except as stated above  PHYSICAL EXAM: BP (!) 133/93   Pulse 91   Resp 16   Wt 234 lb 6.4 oz (106.3 kg)   SpO2 97%   BMI 37.83 kg/m   Physical Exam   General appearance - alert, well appearing, and in no distress Mental status - normal mood, behavior, speech, dress, motor activity, and thought processes Neck - supple, no significant adenopathy Chest - clear to auscultation, no wheezes, rales or rhonchi, symmetric air entry Heart - normal rate, regular rhythm, normal S1, S2, no murmurs, rubs, clicks or gallops Neurological - cranial nerves II through XII intact Grip 4/5 left, 5/5 right.  Power proximal upper extremities 5/5 bilaterally.  Distal upper extremities 4+/5 on the left, 5/5 on the right. Power lower extremities 5/5 bilaterally proximally and distally.  His gait is wide-based and slow with low foot to floor clearance of the left foot. Extremities - peripheral pulses normal, no pedal edema, no clubbing or cyanosis  CMP Latest Ref Rng & Units 01/05/2021 01/04/2021 01/02/2021  Glucose 70 - 99 mg/dL 103(H) 93 94  BUN 6 - 20 mg/dL 15 16 11   Creatinine 0.61 -  1.24 mg/dL 1.69(H) 1.30(H) 1.23  Sodium 135 - 145 mmol/L 138 136 137  Potassium 3.5 - 5.1 mmol/L 4.3 3.4(L) 3.3(L)  Chloride 98 - 111 mmol/L 103 103 105  CO2 22 - 32 mmol/L 29 23 25   Calcium 8.9 - 10.3 mg/dL 9.3 9.2 9.1  Total Protein 6.5 - 8.1 g/dL 6.8 6.9 8.1  Total Bilirubin 0.3 - 1.2 mg/dL 0.6 0.7 0.4  Alkaline Phos 38 - 126 U/L 77 76 82  AST 15 - 41 U/L 29 18 20   ALT 0 - 44 U/L 30 22 29    Lipid Panel     Component Value Date/Time   CHOL 198 01/04/2021 0116   CHOL 201 (H) 01/13/2018 1653   TRIG 174 (H) 01/04/2021 0116   HDL 29 (L) 01/04/2021 0116   HDL 35 (L) 01/13/2018 1653   CHOLHDL 6.8 01/04/2021 0116   VLDL 35 01/04/2021 0116   LDLCALC 134 (H) 01/04/2021 0116   LDLCALC 144 (H) 06/07/2020 0948    CBC    Component Value Date/Time   WBC 7.5 01/05/2021 0256   RBC 4.69 01/05/2021 0256   HGB 15.5 01/05/2021 0256   HGB 15.3 01/13/2018 1653   HCT 45.1 01/05/2021 0256   HCT 44.9 01/13/2018 1653   PLT 181 01/05/2021 0256   PLT 234 01/13/2018 1653   MCV 96.2 01/05/2021 0256   MCV 96 01/13/2018 1653   MCH 33.0 01/05/2021 0256   MCHC 34.4 01/05/2021 0256   RDW 13.3 01/05/2021 0256   RDW 15.0 01/13/2018 1653   LYMPHSABS 3.4 01/05/2021 0256   MONOABS 0.6 01/05/2021 0256   EOSABS 0.3 01/05/2021 0256   BASOSABS 0.1 01/05/2021 0256    ASSESSMENT AND PLAN: 1. Hospital discharge follow-up   2. History of cerebrovascular accident (CVA) involving cerebellum Continue Plavix for now.  Continue atorvastatin.  Blood pressure is not at goal.  We have added low-dose carvedilol.  Continue amlodipine.  ACE inhibitor or thiazide diuretic not added due to kidney function at this time.  Plan to check BMP. -I will release him to work light duty with stipulations of no lifting, pushing,pulling or prolong standing.  I also  request that he has the physical therapist touch base with me tomorrow after his session to advise whether he or she also agree with the plan. - clopidogrel (PLAVIX) 75  MG tablet; Take 1 tablet (75 mg total) by mouth daily.  Dispense: 30 tablet; Refill: 3 - Ambulatory referral to Occupational Therapy  3. Essential hypertension See plan above. - CBC - carvedilol (COREG) 3.125 MG tablet; Take 1 tablet (3.125 mg total) by mouth 2 (two) times daily with a meal.  Dispense: 60 tablet; Refill: 3  4. Tobacco dependence Commended him on cutting back.  Strongly advised him to quit smoking completely.  He wants to quit smoking.  He will continue using the nicotine patches.  He will let me know when he needs a refill and when he feels he is able to step down to the 7 mg patch.  Encouraged him to set a quit date.  3 minutes spent on counseling.  We will reassess progress on subsequent visit.   5. Stage 3a chronic kidney disease ([place]) A little worse compared to prehospital admission.  We will recheck chemistry today.  Avoid NSAIDs. - Basic metabolic panel    Patient was given the opportunity to ask questions.  Patient verbalized understanding of the plan and was able to repeat key elements of the plan.   Orders Placed This Encounter  Procedures   CBC   Basic metabolic panel   Addendum 3:49 p.m:  I have read P.T note from today.  Pt okay to return to work light duty.  He will continue P.T with hope of getting him to goal by mid August.  Pt came by today for me to write a work release letter.  He states he will return full time and his boss told him they will allow him to go to his P.T sessions.  Pt states he will go to his sessions after work.   Requested Prescriptions   Signed Prescriptions Disp Refills   carvedilol (COREG) 3.125 MG tablet 60 tablet 3    Sig: Take 1 tablet (3.125 mg total) by mouth 2 (two) times daily with a meal.   clopidogrel (PLAVIX) 75 MG tablet 30 tablet 3    Sig: Take 1 tablet (75 mg total) by mouth daily.    Return for Give appt with Lurena Joiner in 1-2 wks for recheck BP.  Karle Plumber, MD, FACP

## 2021-01-27 ENCOUNTER — Ambulatory Visit: Payer: 59 | Attending: Internal Medicine | Admitting: Physical Therapy

## 2021-01-27 ENCOUNTER — Other Ambulatory Visit: Payer: Self-pay

## 2021-01-27 DIAGNOSIS — R270 Ataxia, unspecified: Secondary | ICD-10-CM | POA: Diagnosis present

## 2021-01-27 DIAGNOSIS — I69854 Hemiplegia and hemiparesis following other cerebrovascular disease affecting left non-dominant side: Secondary | ICD-10-CM | POA: Diagnosis present

## 2021-01-27 DIAGNOSIS — M6281 Muscle weakness (generalized): Secondary | ICD-10-CM | POA: Diagnosis present

## 2021-01-27 DIAGNOSIS — R2681 Unsteadiness on feet: Secondary | ICD-10-CM | POA: Diagnosis present

## 2021-01-27 DIAGNOSIS — R482 Apraxia: Secondary | ICD-10-CM | POA: Insufficient documentation

## 2021-01-27 DIAGNOSIS — R2689 Other abnormalities of gait and mobility: Secondary | ICD-10-CM | POA: Insufficient documentation

## 2021-01-27 DIAGNOSIS — R41840 Attention and concentration deficit: Secondary | ICD-10-CM | POA: Diagnosis present

## 2021-01-27 DIAGNOSIS — R278 Other lack of coordination: Secondary | ICD-10-CM | POA: Diagnosis not present

## 2021-01-27 LAB — CBC
Hematocrit: 46 % (ref 37.5–51.0)
Hemoglobin: 15.5 g/dL (ref 13.0–17.7)
MCH: 32.4 pg (ref 26.6–33.0)
MCHC: 33.7 g/dL (ref 31.5–35.7)
MCV: 96 fL (ref 79–97)
Platelets: 256 10*3/uL (ref 150–450)
RBC: 4.78 x10E6/uL (ref 4.14–5.80)
RDW: 13.6 % (ref 11.6–15.4)
WBC: 8.9 10*3/uL (ref 3.4–10.8)

## 2021-01-27 LAB — BASIC METABOLIC PANEL
BUN/Creatinine Ratio: 9 (ref 9–20)
BUN: 11 mg/dL (ref 6–24)
CO2: 21 mmol/L (ref 20–29)
Calcium: 9.6 mg/dL (ref 8.7–10.2)
Chloride: 104 mmol/L (ref 96–106)
Creatinine, Ser: 1.24 mg/dL (ref 0.76–1.27)
Glucose: 83 mg/dL (ref 65–99)
Potassium: 4.4 mmol/L (ref 3.5–5.2)
Sodium: 140 mmol/L (ref 134–144)
eGFR: 72 mL/min/{1.73_m2} (ref >59)

## 2021-01-27 NOTE — Therapy (Signed)
[facility] [address] [place], [place], [address] Phone: [phone]   Fax:  [phone]  Physical Therapy Treatment  Patient Details  Name: Drew Cuevas MRN: [id number] Date of Birth: 1971/06/23 Referring Provider (PT): Referred by hospitalist; will send to PCP: Ladell Pier, MD   Encounter Date: 01/27/2021   PT End of Session - 01/27/21 1110     Visit Number 7    Number of Visits 17    Date for PT Re-Evaluation 03/13/21    Authorization Type [employer]; 30 VL - authorization required after 20th visit    Authorization - Visit Number 7    Authorization - Number of Visits 20    PT Start Time 2449    PT Stop Time 1058    PT Time Calculation (min) 39 min    Activity Tolerance Patient tolerated treatment well    Behavior During Therapy [facility] for tasks assessed/performed             Past Medical History:  Diagnosis Date   Bronchitis    LAST FLARE UP WAS NEW YEARS 2015   Cancer ([place])    kidney   GERD (gastroesophageal reflux disease)    NO MEDS   HIV (human immunodeficiency virus infection) ([place])    UNDER CONTROL WITH MEDICATIONS   Hypertension    Immune deficiency disorder ([place])    Renal insufficiency 05/17/2015   Renal mass, right     Past Surgical History:  Procedure Laterality Date   NO PAST SURGERIES     ROBOT ASSISTED LAPAROSCOPIC NEPHRECTOMY Right 08/19/2013   Procedure: ROBOTIC ASSISTED LAPAROSCOPIC RIGHT PARTIAL NEPHRECTOMY;  Surgeon: Ardis Hughs, MD;  Location: [facility];  Service: Urology;  Laterality: Right;    There were no vitals filed for this visit.   Subjective Assessment - 01/27/21 1025     Subjective Went to see primary care physician who ordered OT and a new BP medication.  Would like to discuss release back to work with PT.  Still wants to hold on OT right now.  Found some exercises and has been working his hand.    Pertinent History Kidney CA, HIV, bronchitis, HTN,  renal insufficiency, nephrectomy    Limitations Standing;Walking    Patient Stated Goals Walking better, walking without the cane    Currently in Pain? No/denies    Pain Onset More than a month ago                [facility] PT Assessment - 01/27/21 1032       Ambulation/Gait   Ambulation/Gait Yes    Ambulation/Gait Assistance 6: Modified independent (Device/Increase time)    Ambulation Distance (Feet) 790 Feet    Assistive device None    Gait Pattern Step-through pattern;Decreased arm swing - left;Decreased stance time - left;Decreased stride length;Decreased trunk rotation;Poor foot clearance - left    Ambulation Surface Level;Indoor    Stairs Yes    Stairs Assistance 5: Supervision    Stair Management Technique One rail Right;Alternating pattern;Forwards    Number of Stairs 12    Height of Stairs 6      6 Minute Walk- Baseline   6 Minute Walk- Baseline yes    BP (mmHg) 123/97    HR (bpm) 90    02 Sat (%RA) 94 %    Modified Borg Scale for Dyspnea 0- Nothing at all    Perceived Rate of Exertion (Borg) 7- Very, very light  6 Minute walk- Post Test   6 Minute Walk Post Test yes    BP (mmHg) 131/101    HR (bpm) 98    02 Sat (%RA) 98 %    Modified Borg Scale for Dyspnea 1- Very mild shortness of breath    Perceived Rate of Exertion (Borg) 13- Somewhat hard      6 minute walk test results    Aerobic Endurance Distance Walked 790    Endurance additional comments after first three minutes gait velocity slowed and pt demonstrated decreased stance on L and slightly more narrow BOS.  After 3 minute seated rest break at the end, pt's BP returned to 127/96.                                   PT Education - 01/27/21 1109     Education Details recommendations for returning to work, shifted two appointments next week to avoid conflicting with work schedule.    Person(s) Educated Patient    Methods Explanation    Comprehension Verbalized understanding               PT Short Term Goals - 01/27/21 1117       PT SHORT TERM GOAL #1   Title Pt will demonstrate independence with initial HEP    Time 4    Period Weeks    Status New    Target Date 02/11/21      PT SHORT TERM GOAL #2   Title Pt will improve five time sit to stand to </= 13.5 seconds with no anterior LOB upon rising    Baseline 14 seconds with anterior LOB when standing    Time 4    Period Weeks    Status New    Target Date 02/11/21      PT SHORT TERM GOAL #3   Title Pt will improve gait velocity without cane to >/= 2.62 ft/sec    Baseline 2.05 ft/sec without cane    Time 4    Period Weeks    Status New    Target Date 02/11/21      PT SHORT TERM GOAL #4   Title Pt will increase DGI score by 4 points to indicate decreased falls risk    Baseline 13/24    Time 4    Period Weeks    Status New    Target Date 02/11/21      PT SHORT TERM GOAL #5   Title Pt will negotiate 4 stairs with one rail (R to ascend) with alternating sequence and min A (no cane)    Baseline met on 7/1    Time 4    Period Weeks    Status Achieved    Target Date 02/11/21               PT Long Term Goals - 01/12/21 1654       PT LONG TERM GOAL #1   Title Pt will demonstrate independence with final HEP    Time 8    Period Weeks    Status New    Target Date 03/13/21      PT LONG TERM GOAL #2   Title Pt will increase gait velocity without cane to >/= 3.0 ft/sec with increased L arm swing    Time 8    Period Weeks    Status New    Target Date 03/13/21  PT LONG TERM GOAL #3   Title Pt will increase DGI score to >/= 20/24 without use of cane    Time 8    Period Weeks    Status New    Target Date 03/13/21      PT LONG TERM GOAL #4   Title Pt will negotiate 4 stairs with 1 rail, alternating sequence MOD I (no cane) to improve safety with home entry/exit    Time 8    Period Weeks    Status New    Target Date 03/13/21      PT LONG TERM GOAL #5   Title Pt will  demonstrate ability to perform functional bimanual task safely and independently without having to look at LUE when performing    Time 8    Period Weeks    Status New    Target Date 03/13/21                   Plan - 01/27/21 1111     Clinical Impression Statement Pt has discussed returning to work next week on light duty with physician.  Treatment session today focused on assessment of aerobic endurance and gait with prolonged ambulation with 6 minute walk test.  Pt did reported only moderate exertion, HR and Sp02 remained stable, BP did increase 5-6 points but after 3 minute seated rest break, returned to baseline.  PT recommending pt to return to work on light duty and having multiple seated rest breaks throughout the day, especially if pt has been walking around restaurant/kitchen for an extended amount of time.  Will alert physician.  Re-assessed pt's safety on stairs - pt is now performing 12 stairs with alternating sequence and one UE support on rail without safety concerns.  Stair STG was met today.  Pt to continue with PT once he returns to work; 2 sessions next week adjusted to avoid conflicting with work schedule.  Will continue to address L sided deficits to continue to progress towards LTG.    Personal Factors and Comorbidities Comorbidity 3+;Profession    Comorbidities Kidney CA, HIV, bronchitis, HTN, renal insufficiency, nephrectomy    Examination-Activity Limitations Bend;Carry;Dressing;Lift;Locomotion Level;Reach Overhead;Stairs    Examination-Participation Restrictions Community Activity;Driving;Occupation    Stability/Clinical Decision Making Evolving/Moderate complexity    Rehab Potential Good    PT Frequency 3x / week    PT Duration 8 weeks    PT Treatment/Interventions ADLs/Self Care Home Management;Gait training;Stair training;Functional mobility training;Therapeutic activities;Therapeutic exercise;Balance training;Neuromuscular re-education;Patient/family  education;Orthotic Fit/Training;Vestibular;Visual/perceptual remediation/compensation    PT Next Visit Plan How has return to work been?  Continue to work on functional gait, endurance, increased stance on LLE, step length.  Strengthening of LLE.  Use of LUE when ambulating (carrying, reaching, etc).    Consulted and Agree with Plan of Care Patient             Patient will benefit from skilled therapeutic intervention in order to improve the following deficits and impairments:  Abnormal gait, Decreased activity tolerance, Decreased balance, Decreased coordination, Difficulty walking, Impaired UE functional use, Impaired vision/preception  Visit Diagnosis: Other lack of coordination  Other abnormalities of gait and mobility  Unsteadiness on feet     Problem List Patient Active Problem List   Diagnosis Date Noted   Cerebellar stroke ([place]) 01/03/2021   COVID-19 virus infection 01/03/2021   Diverticulitis of large intestine with abscess 09/27/2020   Depression 06/19/2019   Other insomnia 06/19/2019   Chronic kidney disease, stage 3a ([place]) 04/15/2018  Tobacco dependence 01/13/2018   Primary osteoarthritis of right knee 04/03/2017   Dyslipidemia 06/14/2016   Renal cell carcinoma of right kidney ([place]) 08/19/2013   Renal cyst 05/21/2013   Obesity (BMI 30-39.9) 05/26/2012   Internal hemorrhoids 12/01/2008   Hypertension 04/28/2008   Major depressive disorder, single episode, severe ([place]) 12/20/2006   Human immunodeficiency virus (HIV) disease ([place]) 08/28/2006    Rico Junker, PT, DPT 01/27/21    11:19 AM    [facility] [address] [place], [place], [address] Phone: [phone]   Fax:  [phone]  Name: Fabrizio Filip MRN: [id number] Date of Birth: [birth year]

## 2021-01-31 ENCOUNTER — Ambulatory Visit: Payer: 59

## 2021-01-31 ENCOUNTER — Other Ambulatory Visit: Payer: Self-pay

## 2021-01-31 ENCOUNTER — Encounter: Payer: Self-pay | Admitting: Internal Medicine

## 2021-01-31 VITALS — BP 128/86

## 2021-01-31 DIAGNOSIS — R278 Other lack of coordination: Secondary | ICD-10-CM | POA: Diagnosis not present

## 2021-01-31 DIAGNOSIS — R2689 Other abnormalities of gait and mobility: Secondary | ICD-10-CM

## 2021-01-31 DIAGNOSIS — R2681 Unsteadiness on feet: Secondary | ICD-10-CM

## 2021-01-31 NOTE — Therapy (Signed)
[facility] [address] [place], [place], [address] Phone: [phone]   Fax:  [phone]  Physical Therapy Treatment  Patient Details  Name: Drew Cuevas MRN: [id number] Date of Birth: [birth year] Referring Provider (PT): Referred by hospitalist; will send to PCP: Ladell Pier, MD   Encounter Date: 01/31/2021   PT End of Session - 01/31/21 1714     Visit Number 8    Number of Visits 17    Date for PT Re-Evaluation 03/13/21    Authorization Type [employer]; 30 VL - authorization required after 20th visit    Authorization - Visit Number 7    Authorization - Number of Visits 20    Progress Note Due on Visit 10    PT Start Time 1705    PT Stop Time 1745    PT Time Calculation (min) 40 min    Equipment Utilized During Treatment Gait belt    Activity Tolerance Patient tolerated treatment well    Behavior During Therapy WFL for tasks assessed/performed             Past Medical History:  Diagnosis Date   Bronchitis    LAST FLARE UP WAS NEW YEARS 2015   Cancer ([place])    kidney   GERD (gastroesophageal reflux disease)    NO MEDS   HIV (human immunodeficiency virus infection) ([place])    UNDER CONTROL WITH MEDICATIONS   Hypertension    Immune deficiency disorder ([place])    Renal insufficiency 05/17/2015   Renal mass, right     Past Surgical History:  Procedure Laterality Date   NO PAST SURGERIES     ROBOT ASSISTED LAPAROSCOPIC NEPHRECTOMY Right 08/19/2013   Procedure: ROBOTIC ASSISTED LAPAROSCOPIC RIGHT PARTIAL NEPHRECTOMY;  Surgeon: Ardis Hughs, MD;  Location: [facility];  Service: Urology;  Laterality: Right;    Vitals:   01/31/21 1708  BP: 128/86     Subjective Assessment - 01/31/21 1710     Subjective Doing well today, will begin work tomorrow and he feels ready for it, no adverse effects from BP med change    Patient is accompained by: Family member    Pertinent History Kidney CA, HIV,  bronchitis, HTN, renal insufficiency, nephrectomy    Limitations Standing;Walking    Patient Stated Goals Walking better, walking without the cane    Pain Onset More than a month ago                               [facility] Adult PT Treatment/Exercise - 01/31/21 0001       Lumbar Exercises: Seated   Other Seated Lumbar Exercises seated core exercises of hip and shoulder tosses, chops, Vs, 2.2# ball 10 reps per activity    Other Seated Lumbar Exercises latissimus press with inspiration 15x      Knee/Hip Exercises: Aerobic   Other Aerobic Scifit L2 arms 6 8'      Knee/Hip Exercises: Seated   Long Arc Quad Strengthening;Both;1 set;15 reps;Weights;Limitations    Long Arc Quad Weight 2 lbs.    Long Arc Quad Limitations alternating pattern    Marching Strengthening;Both;1 set;15 reps;Limitations;Weights    Marching Limitations alternating    Marching Weights 2 lbs.                 Balance Exercises - 01/31/21 0001       Balance Exercises: Standing   Tandem Gait Forward;Retro;Upper extremity support;3  reps;Limitations    Tandem Gait Limitations performed at counter    Sidestepping Upper extremity support;3 reps;Limitations    Sidestepping Limitations 3 trips at counter stepping over 3 floor pebbles    Other Standing Exercises standing with foam roll support, alt UE/LE abduction, 2# at ankles, 15x per side                 PT Short Term Goals - 01/27/21 1117       PT SHORT TERM GOAL #1   Title Pt will demonstrate independence with initial HEP    Time 4    Period Weeks    Status New    Target Date 02/11/21      PT SHORT TERM GOAL #2   Title Pt will improve five time sit to stand to </= 13.5 seconds with no anterior LOB upon rising    Baseline 14 seconds with anterior LOB when standing    Time 4    Period Weeks    Status New    Target Date 02/11/21      PT SHORT TERM GOAL #3   Title Pt will improve gait velocity without cane to >/= 2.62  ft/sec    Baseline 2.05 ft/sec without cane    Time 4    Period Weeks    Status New    Target Date 02/11/21      PT SHORT TERM GOAL #4   Title Pt will increase DGI score by 4 points to indicate decreased falls risk    Baseline 13/24    Time 4    Period Weeks    Status New    Target Date 02/11/21      PT SHORT TERM GOAL #5   Title Pt will negotiate 4 stairs with one rail (R to ascend) with alternating sequence and min A (no cane)    Baseline met on 7/1    Time 4    Period Weeks    Status Achieved    Target Date 02/11/21               PT Long Term Goals - 01/12/21 1654       PT LONG TERM GOAL #1   Title Pt will demonstrate independence with final HEP    Time 8    Period Weeks    Status New    Target Date 03/13/21      PT LONG TERM GOAL #2   Title Pt will increase gait velocity without cane to >/= 3.0 ft/sec with increased L arm swing    Time 8    Period Weeks    Status New    Target Date 03/13/21      PT LONG TERM GOAL #3   Title Pt will increase DGI score to >/= 20/24 without use of cane    Time 8    Period Weeks    Status New    Target Date 03/13/21      PT LONG TERM GOAL #4   Title Pt will negotiate 4 stairs with 1 rail, alternating sequence MOD I (no cane) to improve safety with home entry/exit    Time 8    Period Weeks    Status New    Target Date 03/13/21      PT LONG TERM GOAL #5   Title Pt will demonstrate ability to perform functional bimanual task safely and independently without having to look at LUE when performing    Time 8  Period Weeks    Status New    Target Date 03/13/21                   Plan - 01/31/21 1725     Clinical Impression Statement Todays session consisted aerobic training on Scifit, seated core exercises against resistance incorporating functional tasks, balance activities and SLS tasks,    Personal Factors and Comorbidities Comorbidity 3+;Profession    Comorbidities Kidney CA, HIV, bronchitis, HTN, renal  insufficiency, nephrectomy    Examination-Activity Limitations Bend;Carry;Dressing;Lift;Locomotion Level;Reach Overhead;Stairs    Examination-Participation Restrictions Community Activity;Driving;Occupation    Stability/Clinical Decision Making Evolving/Moderate complexity    Rehab Potential Good    PT Frequency 3x / week    PT Duration 8 weeks    PT Treatment/Interventions ADLs/Self Care Home Management;Gait training;Stair training;Functional mobility training;Therapeutic activities;Therapeutic exercise;Balance training;Neuromuscular re-education;Patient/family education;Orthotic Fit/Training;Vestibular;Visual/perceptual remediation/compensation    PT Next Visit Plan How has return to work been?  Continue to work on functional gait, endurance, increased stance on LLE, step length.  Strengthening of LLE.  Use of LUE when ambulating (carrying, reaching, etc).    Consulted and Agree with Plan of Care Patient             Patient will benefit from skilled therapeutic intervention in order to improve the following deficits and impairments:  Abnormal gait, Decreased activity tolerance, Decreased balance, Decreased coordination, Difficulty walking, Impaired UE functional use, Impaired vision/preception  Visit Diagnosis: Other lack of coordination  Other abnormalities of gait and mobility  Unsteadiness on feet     Problem List Patient Active Problem List   Diagnosis Date Noted   Cerebellar stroke ([place]) 01/03/2021   COVID-19 virus infection 01/03/2021   Diverticulitis of large intestine with abscess 09/27/2020   Depression 06/19/2019   Other insomnia 06/19/2019   Chronic kidney disease, stage 3a ([place]) 04/15/2018   Tobacco dependence 01/13/2018   Primary osteoarthritis of right knee 04/03/2017   Dyslipidemia 06/14/2016   Renal cell carcinoma of right kidney ([place]) 08/19/2013   Renal cyst 05/21/2013   Obesity (BMI 30-39.9) 05/26/2012   Internal hemorrhoids 12/01/2008   Hypertension  04/28/2008   Major depressive disorder, single episode, severe ([place]) 12/20/2006   Human immunodeficiency virus (HIV) disease ([place]) 08/28/2006    Lanice Shirts 01/31/2021, 6:17 PM  [facility] [address] [place], [place], [address] Phone: [phone]   Fax:  [phone]  Name: Tiara Bartoli MRN: [id number] Date of Birth: 08/13/1970

## 2021-02-02 ENCOUNTER — Telehealth: Payer: Self-pay | Admitting: Internal Medicine

## 2021-02-02 ENCOUNTER — Ambulatory Visit: Payer: 59 | Admitting: Physical Therapy

## 2021-02-02 NOTE — Telephone Encounter (Signed)
Already spoke with pt when came into office to pick up FMLA

## 2021-02-02 NOTE — Telephone Encounter (Signed)
Copied from [place] [id number]. Topic: General - Other >> Jan 27, 2021  1:34 PM Pawlus, Brayton Layman A wrote: Reason for CRM: Pt was seen yesterday 6/30 and requested a call back from Dr Wynetta Emery / her nurse, did not specify the exact reasoning for the call.

## 2021-02-06 ENCOUNTER — Other Ambulatory Visit: Payer: Self-pay

## 2021-02-06 ENCOUNTER — Ambulatory Visit: Payer: 59 | Admitting: Physical Therapy

## 2021-02-06 DIAGNOSIS — R2681 Unsteadiness on feet: Secondary | ICD-10-CM

## 2021-02-06 DIAGNOSIS — R278 Other lack of coordination: Secondary | ICD-10-CM

## 2021-02-06 DIAGNOSIS — R2689 Other abnormalities of gait and mobility: Secondary | ICD-10-CM

## 2021-02-06 MED ORDER — ATORVASTATIN CALCIUM 80 MG PO TABS: 80.0000 mg | ORAL_TABLET | Freq: Every day | ORAL | 6 refills | Status: DC

## 2021-02-06 NOTE — Therapy (Signed)
[facility] [address] [place], [place], [address] Phone: [phone]   Fax:  [phone]  Name: Drew Cuevas MRN: [id number] Date of Birth: [birth year]  [facility] [address] [place], [place], [address] Phone: [phone]   Fax:  [phone]  Name: Drew Cuevas MRN: [id number] Date of Birth: [birth year]  [facility] [address] [place], [place], [address] Phone: [phone]   Fax:  [phone]  Physical Therapy Treatment  Patient Details  Name: Drew Cuevas MRN: [id number] Date of Birth: 1971-04-19 Referring Provider (PT): Referred by hospitalist; will send to PCP: Ladell Pier, MD   Encounter Date: 02/06/2021   PT End of Session - 02/06/21 1114     Visit Number 9    Number of Visits 17    Date for PT Re-Evaluation 03/13/21    Authorization Type [employer]; 30 VL - authorization required after 20th visit    Authorization - Visit Number 8    Authorization - Number of Visits 20    Progress Note Due on Visit 10    PT Start Time 1100    PT Stop Time 1145    PT Time Calculation (min) 45 min    Equipment Utilized During Treatment Gait belt    Activity Tolerance Patient tolerated treatment well    Behavior During Therapy [facility] for tasks assessed/performed             Past Medical History:  Diagnosis Date   Bronchitis    LAST FLARE UP WAS NEW YEARS 2015   Cancer ([place])    kidney   GERD (gastroesophageal reflux disease)    NO MEDS   HIV (human immunodeficiency virus infection) ([place])    UNDER CONTROL WITH MEDICATIONS   Hypertension    Immune deficiency disorder ([place])    Renal insufficiency 05/17/2015   Renal mass, right     Past Surgical History:  Procedure Laterality Date   NO PAST SURGERIES     ROBOT ASSISTED LAPAROSCOPIC NEPHRECTOMY Right 08/19/2013   Procedure: ROBOTIC ASSISTED LAPAROSCOPIC RIGHT PARTIAL NEPHRECTOMY;  Surgeon: Ardis Hughs, MD;  Location: [facility];  Service: Urology;  Laterality: Right;    There were no vitals filed for this visit.   Subjective Assessment - 02/06/21 1104     Subjective Pt reports he's been doing full shifts now. No issues just that sometimes his L LE doesn't feel like it's bending.    Patient is accompained by: Family member    Pertinent History Kidney  CA, HIV, bronchitis, HTN, renal insufficiency, nephrectomy    Limitations Standing;Walking    Patient Stated Goals Walking better, walking without the cane    Currently in Pain? No/denies    Pain Onset More than a month ago                [facility] PT Assessment - 02/06/21 0001       Transfers   Five time sit to stand comments  11 sec      Dynamic Gait Index   Level Surface Mild Impairment    Change in Gait Speed Normal    Gait with Horizontal Head Turns Mild Impairment    Gait with Vertical Head Turns Normal    Gait and Pivot Turn Normal    Step Over Obstacle Mild Impairment    Step Around Obstacles Normal    Steps Mild Impairment    Total Score 20    DGI comment: 20/24                           [facility] Adult PT Treatment/Exercise - 02/06/21 0001       Ambulation/Gait   Ambulation/Gait Assistance 7: Independent    Ambulation Distance (Feet) 330 Feet    Assistive device None  [facility] [address] [place], [place], [address] Phone: [phone]   Fax:  [phone]  Physical Therapy Treatment  Patient Details  Name: Drew Cuevas MRN: [id number] Date of Birth: 1971-04-19 Referring Provider (PT): Referred by hospitalist; will send to PCP: Ladell Pier, MD   Encounter Date: 02/06/2021   PT End of Session - 02/06/21 1114     Visit Number 9    Number of Visits 17    Date for PT Re-Evaluation 03/13/21    Authorization Type [employer]; 30 VL - authorization required after 20th visit    Authorization - Visit Number 8    Authorization - Number of Visits 20    Progress Note Due on Visit 10    PT Start Time 1100    PT Stop Time 1145    PT Time Calculation (min) 45 min    Equipment Utilized During Treatment Gait belt    Activity Tolerance Patient tolerated treatment well    Behavior During Therapy [facility] for tasks assessed/performed             Past Medical History:  Diagnosis Date   Bronchitis    LAST FLARE UP WAS NEW YEARS 2015   Cancer ([place])    kidney   GERD (gastroesophageal reflux disease)    NO MEDS   HIV (human immunodeficiency virus infection) ([place])    UNDER CONTROL WITH MEDICATIONS   Hypertension    Immune deficiency disorder ([place])    Renal insufficiency 05/17/2015   Renal mass, right     Past Surgical History:  Procedure Laterality Date   NO PAST SURGERIES     ROBOT ASSISTED LAPAROSCOPIC NEPHRECTOMY Right 08/19/2013   Procedure: ROBOTIC ASSISTED LAPAROSCOPIC RIGHT PARTIAL NEPHRECTOMY;  Surgeon: Ardis Hughs, MD;  Location: [facility];  Service: Urology;  Laterality: Right;    There were no vitals filed for this visit.   Subjective Assessment - 02/06/21 1104     Subjective Pt reports he's been doing full shifts now. No issues just that sometimes his L LE doesn't feel like it's bending.    Patient is accompained by: Family member    Pertinent History Kidney  CA, HIV, bronchitis, HTN, renal insufficiency, nephrectomy    Limitations Standing;Walking    Patient Stated Goals Walking better, walking without the cane    Currently in Pain? No/denies    Pain Onset More than a month ago                [facility] PT Assessment - 02/06/21 0001       Transfers   Five time sit to stand comments  11 sec      Dynamic Gait Index   Level Surface Mild Impairment    Change in Gait Speed Normal    Gait with Horizontal Head Turns Mild Impairment    Gait with Vertical Head Turns Normal    Gait and Pivot Turn Normal    Step Over Obstacle Mild Impairment    Step Around Obstacles Normal    Steps Mild Impairment    Total Score 20    DGI comment: 20/24                           [facility] Adult PT Treatment/Exercise - 02/06/21 0001       Ambulation/Gait   Ambulation/Gait Assistance 7: Independent    Ambulation Distance (Feet) 330 Feet    Assistive device None

## 2021-02-07 MED ORDER — METHOCARBAMOL 500 MG PO TABS: 500.0000 mg | ORAL_TABLET | Freq: Two times a day (BID) | ORAL | 1 refills | Status: DC | PRN

## 2021-02-07 NOTE — Addendum Note (Signed)
Addended by: Karle Plumber B on: 02/07/2021 11:36 PM   Modules accepted: Orders

## 2021-02-09 ENCOUNTER — Ambulatory Visit: Payer: 59 | Admitting: Physical Therapy

## 2021-02-09 ENCOUNTER — Ambulatory Visit: Payer: 59 | Attending: Internal Medicine | Admitting: Pharmacist

## 2021-02-09 ENCOUNTER — Other Ambulatory Visit: Payer: Self-pay

## 2021-02-09 ENCOUNTER — Encounter: Payer: Self-pay | Admitting: Pharmacist

## 2021-02-09 VITALS — BP 119/81

## 2021-02-09 DIAGNOSIS — I10 Essential (primary) hypertension: Secondary | ICD-10-CM

## 2021-02-09 NOTE — Progress Notes (Signed)
   S:    PCP: Dr. Wynetta Emery  Patient arrives in good spirits. Presents to the clinic for hypertension evaluation, counseling, and management. Patient was referred and last seen by Primary Care Provider on 01/26/2021.   Medication adherence reported. He has taken his AM dose of carvedilol today. He has also taken amlodipine.   Current BP Medications include:  amlodipine 10 mg daily, carvedilol 3.125 mg BID  Dietary habits include: reports that he limits salt. Has eliminated por from his diet. Denies drinking caffeine. Has lost his taste for soda.  Exercise habits include:reports ~2 hours of exercise on the treadmill daily  Family / Social history:  -Fhx: HTN -Tobacco: current 0.5 PPD smoker  -Alcohol: none currently    O:  Vitals:   02/09/21 1539  BP: 119/81   Home BP readings: none  Last 3 Office BP readings: BP Readings from Last 3 Encounters:  02/09/21 119/81  01/31/21 128/86  01/26/21 (!) 133/93    BMET    Component Value Date/Time   NA 140 01/26/2021 1205   K 4.4 01/26/2021 1205   CL 104 01/26/2021 1205   CO2 21 01/26/2021 1205   GLUCOSE 83 01/26/2021 1205   GLUCOSE 103 (H) 01/05/2021 0256   BUN 11 01/26/2021 1205   CREATININE 1.24 01/26/2021 1205   CREATININE 1.54 (H) 06/07/2020 0948   CALCIUM 9.6 01/26/2021 1205   GFRNONAA 49 (L) 01/05/2021 0256   GFRNONAA 69 02/19/2012 1153   GFRAA 64 01/13/2018 1653   GFRAA 79 02/19/2012 1153    Renal function: CrCl cannot be calculated (Unknown ideal weight.).  Clinical ASCVD: Yes  The ASCVD Risk score Mikey Bussing DC Jr., et al., 2013) failed to calculate for the following reasons:   The patient has a prior MI or stroke diagnosis  A/P: Hypertension longstanding currently controlled on current medications. BP Goal = < 130/80 mmHg. Medication adherence reported.  -Continued current medications.  -Counseled on lifestyle modifications for blood pressure control including reduced dietary sodium, increased exercise, adequate  sleep.  Results reviewed and written information provided.   Total time in face-to-face counseling 30 minutes.   F/U Clinic Visit with PCP.    Benard Halsted, PharmD, [place], [place] [phone]

## 2021-02-10 ENCOUNTER — Ambulatory Visit: Payer: 59 | Admitting: Physical Therapy

## 2021-02-10 VITALS — BP 125/89

## 2021-02-10 DIAGNOSIS — R278 Other lack of coordination: Secondary | ICD-10-CM | POA: Diagnosis not present

## 2021-02-10 DIAGNOSIS — R2681 Unsteadiness on feet: Secondary | ICD-10-CM

## 2021-02-10 DIAGNOSIS — R2689 Other abnormalities of gait and mobility: Secondary | ICD-10-CM

## 2021-02-10 NOTE — Therapy (Signed)
[facility] [address] [place], [place], [address] Phone: [phone]   Fax:  [phone]  Physical Therapy Treatment  Patient Details  Name: Drew Cuevas MRN: [id number] Date of Birth: 03/02/1971 Referring Provider (PT): Referred by hospitalist; will send to PCP: Ladell Pier, MD   Encounter Date: 02/10/2021   PT End of Session - 02/10/21 1501     Visit Number 10    Number of Visits 17    Date for PT Re-Evaluation 03/13/21    Authorization Type [employer]; 30 VL - authorization required after 20th visit    Authorization - Visit Number 9    Authorization - Number of Visits 20    Progress Note Due on Visit 10    PT Start Time 1450    PT Stop Time 1530    PT Time Calculation (min) 40 min    Equipment Utilized During Treatment Gait belt    Activity Tolerance Patient tolerated treatment well    Behavior During Therapy [facility] for tasks assessed/performed             Past Medical History:  Diagnosis Date   Bronchitis    LAST FLARE UP WAS NEW YEARS 2015   Cancer ([place])    kidney   GERD (gastroesophageal reflux disease)    NO MEDS   HIV (human immunodeficiency virus infection) ([place])    UNDER CONTROL WITH MEDICATIONS   Hypertension    Immune deficiency disorder ([place])    Renal insufficiency 05/17/2015   Renal mass, right     Past Surgical History:  Procedure Laterality Date   NO PAST SURGERIES     ROBOT ASSISTED LAPAROSCOPIC NEPHRECTOMY Right 08/19/2013   Procedure: ROBOTIC ASSISTED LAPAROSCOPIC RIGHT PARTIAL NEPHRECTOMY;  Surgeon: Ardis Hughs, MD;  Location: [facility];  Service: Urology;  Laterality: Right;    Vitals:   02/10/21 1455  BP: 125/89     Subjective Assessment - 02/10/21 1454     Subjective Pt reports nothing new or different noted. Requests to check BP.    Patient is accompained by: Family member    Pertinent History Kidney CA, HIV, bronchitis, HTN, renal insufficiency,  nephrectomy    Limitations Standing;Walking    Patient Stated Goals Walking better, walking without the cane    Currently in Pain? No/denies    Pain Onset More than a month ago                               [facility] Adult PT Treatment/Exercise - 02/10/21 0001       Ambulation/Gait   Ambulation Distance (Feet) 500 Feet    Gait Pattern Step-through pattern;Decreased step length - left;Decreased dorsiflexion - left;Left flexed knee in stance    Ambulation Surface Level;Indoor    Gait Comments 115' dribbling red ball, 115' throwing ball upwards, 115' fast/slow on cue   cues to maintain gait speed, heel strike and step length     Knee/Hip Exercises: Aerobic   Other Aerobic Scifit L6, arms 6 x 5 min      Knee/Hip Exercises: Machines for Strengthening   Cybex Leg Press 110# L LE x10; 115# L LE eccentric 2x10      Knee/Hip Exercises: Standing   Forward Lunges --                 Balance Exercises - 02/10/21 0001       Balance Exercises: Standing  [facility] [address] [place], [place], [address] Phone: [phone]   Fax:  [phone]  Physical Therapy Treatment  Patient Details  Name: Drew Cuevas MRN: [id number] Date of Birth: 03/02/1971 Referring Provider (PT): Referred by hospitalist; will send to PCP: Ladell Pier, MD   Encounter Date: 02/10/2021   PT End of Session - 02/10/21 1501     Visit Number 10    Number of Visits 17    Date for PT Re-Evaluation 03/13/21    Authorization Type [employer]; 30 VL - authorization required after 20th visit    Authorization - Visit Number 9    Authorization - Number of Visits 20    Progress Note Due on Visit 10    PT Start Time 1450    PT Stop Time 1530    PT Time Calculation (min) 40 min    Equipment Utilized During Treatment Gait belt    Activity Tolerance Patient tolerated treatment well    Behavior During Therapy [facility] for tasks assessed/performed             Past Medical History:  Diagnosis Date   Bronchitis    LAST FLARE UP WAS NEW YEARS 2015   Cancer ([place])    kidney   GERD (gastroesophageal reflux disease)    NO MEDS   HIV (human immunodeficiency virus infection) ([place])    UNDER CONTROL WITH MEDICATIONS   Hypertension    Immune deficiency disorder ([place])    Renal insufficiency 05/17/2015   Renal mass, right     Past Surgical History:  Procedure Laterality Date   NO PAST SURGERIES     ROBOT ASSISTED LAPAROSCOPIC NEPHRECTOMY Right 08/19/2013   Procedure: ROBOTIC ASSISTED LAPAROSCOPIC RIGHT PARTIAL NEPHRECTOMY;  Surgeon: Ardis Hughs, MD;  Location: [facility];  Service: Urology;  Laterality: Right;    Vitals:   02/10/21 1455  BP: 125/89     Subjective Assessment - 02/10/21 1454     Subjective Pt reports nothing new or different noted. Requests to check BP.    Patient is accompained by: Family member    Pertinent History Kidney CA, HIV, bronchitis, HTN, renal insufficiency,  nephrectomy    Limitations Standing;Walking    Patient Stated Goals Walking better, walking without the cane    Currently in Pain? No/denies    Pain Onset More than a month ago                               [facility] Adult PT Treatment/Exercise - 02/10/21 0001       Ambulation/Gait   Ambulation Distance (Feet) 500 Feet    Gait Pattern Step-through pattern;Decreased step length - left;Decreased dorsiflexion - left;Left flexed knee in stance    Ambulation Surface Level;Indoor    Gait Comments 115' dribbling red ball, 115' throwing ball upwards, 115' fast/slow on cue   cues to maintain gait speed, heel strike and step length     Knee/Hip Exercises: Aerobic   Other Aerobic Scifit L6, arms 6 x 5 min      Knee/Hip Exercises: Machines for Strengthening   Cybex Leg Press 110# L LE x10; 115# L LE eccentric 2x10      Knee/Hip Exercises: Standing   Forward Lunges --                 Balance Exercises - 02/10/21 0001       Balance Exercises: Standing  Time 8    Period Weeks    Status On-going                   Plan - 02/10/21 1542     Clinical Impression Statement Treatment focused on continuing L LE strengthening, dynamic balance, amb, and coordination. Pt continues to demo great improvements. Increased gait deviations noted when pt asked to perform at higher gait speeds. Upgraded pt's HEP for  home. Has difficulty transitioning between different surfaces as well.    Personal Factors and Comorbidities Comorbidity 3+;Profession    Comorbidities Kidney CA, HIV, bronchitis, HTN, renal insufficiency, nephrectomy    Examination-Activity Limitations Bend;Carry;Dressing;Lift;Locomotion Level;Reach Overhead;Stairs    Examination-Participation Restrictions Community Activity;Driving;Occupation    Stability/Clinical Decision Making Evolving/Moderate complexity    Rehab Potential Good    PT Frequency 3x / week    PT Duration 8 weeks    PT Treatment/Interventions ADLs/Self Care Home Management;Gait training;Stair training;Functional mobility training;Therapeutic activities;Therapeutic exercise;Balance training;Neuromuscular re-education;Patient/family education;Orthotic Fit/Training;Vestibular;Visual/perceptual remediation/compensation    PT Next Visit Plan Continue to work on functional gait, endurance, balance, coordination of L LE. Consider practicing gait on different surfaces (outdoor, indoor compliant surfaces, ramp/inclines). Consider addition of lunging and core stabilization.    PT Home Exercise Plan Access Code: [email or web address]    Consulted and Agree with Plan of Care Patient             Patient will benefit from skilled therapeutic intervention in order to improve the following deficits and impairments:  Abnormal gait, Decreased activity tolerance, Decreased balance, Decreased coordination, Difficulty walking, Impaired UE functional use, Impaired vision/preception  Visit Diagnosis: Other lack of coordination  Other abnormalities of gait and mobility  Unsteadiness on feet     Problem List Patient Active Problem List   Diagnosis Date Noted   Cerebellar stroke ([place]) 01/03/2021   COVID-19 virus infection 01/03/2021   Diverticulitis of large intestine with abscess 09/27/2020   Depression 06/19/2019   Other insomnia 06/19/2019   Chronic kidney disease, stage 3a ([place]) 04/15/2018    Tobacco dependence 01/13/2018   Primary osteoarthritis of right knee 04/03/2017   Dyslipidemia 06/14/2016   Renal cell carcinoma of right kidney ([place]) 08/19/2013   Renal cyst 05/21/2013   Obesity (BMI 30-39.9) 05/26/2012   Internal hemorrhoids 12/01/2008   Hypertension 04/28/2008   Major depressive disorder, single episode, severe ([place]) 12/20/2006   Human immunodeficiency virus (HIV) disease ([place]) 08/28/2006    Yanky Vanderburg April Ma L Philipe Laswell PT, DPT 02/10/2021, 3:45 PM  [facility] [address] [place], [place], [address] Phone: [phone]   Fax:  [phone]  Name: Keoni Risinger MRN: [id number] Date of Birth: [birth year]

## 2021-02-13 ENCOUNTER — Other Ambulatory Visit: Payer: Self-pay

## 2021-02-13 ENCOUNTER — Ambulatory Visit: Payer: 59 | Admitting: Physical Therapy

## 2021-02-13 DIAGNOSIS — R2681 Unsteadiness on feet: Secondary | ICD-10-CM

## 2021-02-13 DIAGNOSIS — R278 Other lack of coordination: Secondary | ICD-10-CM

## 2021-02-13 DIAGNOSIS — R2689 Other abnormalities of gait and mobility: Secondary | ICD-10-CM

## 2021-02-13 NOTE — Therapy (Signed)
[facility] [address] [place], [place], [address] Phone: [phone]   Fax:  [phone]  Physical Therapy Treatment  Patient Details  Name: Drew Cuevas MRN: [id number] Date of Birth: [birth year] Referring Provider (PT): Referred by hospitalist; will send to PCP: Marcine Matar, MD   Encounter Date: 02/13/2021   PT End of Session - 02/13/21 1530     Visit Number 11    Number of Visits 17    Date for PT Re-Evaluation 03/13/21    Authorization Type [employer]; 30 VL - authorization required after 20th visit    Authorization - Visit Number 10    Authorization - Number of Visits 20    Progress Note Due on Visit 10    PT Start Time 1525    PT Stop Time 1610    PT Time Calculation (min) 45 min    Equipment Utilized During Treatment Gait belt    Activity Tolerance Patient tolerated treatment well    Behavior During Therapy WFL for tasks assessed/performed             Past Medical History:  Diagnosis Date   Bronchitis    LAST FLARE UP WAS NEW YEARS 2015   Cancer ([facility])    kidney   GERD (gastroesophageal reflux disease)    NO MEDS   HIV (human immunodeficiency virus infection) ([facility])    UNDER CONTROL WITH MEDICATIONS   Hypertension    Immune deficiency disorder ([facility])    Renal insufficiency 05/17/2015   Renal mass, right     Past Surgical History:  Procedure Laterality Date   NO PAST SURGERIES     ROBOT ASSISTED LAPAROSCOPIC NEPHRECTOMY Right 08/19/2013   Procedure: ROBOTIC ASSISTED LAPAROSCOPIC RIGHT PARTIAL NEPHRECTOMY;  Surgeon: Crist Fat, MD;  Location: [facility];  Service: Urology;  Laterality: Right;    There were no vitals filed for this visit.   Subjective Assessment - 02/13/21 1529     Subjective Pt states he's been wearing a 2 lb weight on his left leg and arm but it wore him out today. Pt reports nothing else new or different. Pt states he's been able to get through all of his  HEP.    Patient is accompained by: Family member    Pertinent History Kidney CA, HIV, bronchitis, HTN, renal insufficiency, nephrectomy    Limitations Standing;Walking    Patient Stated Goals Walking better, walking without the cane    Currently in Pain? No/denies    Pain Onset More than a month ago                               [facility] Adult PT Treatment/Exercise - 02/13/21 0001       Ambulation/Gait   Ambulation Distance (Feet) 500 Feet    Assistive device None    Gait Pattern Step-through pattern;Left flexed knee in stance;Lateral hip instability    Ambulation Surface Unlevel;Outdoor;Paved;Grass;Gravel    Ramp 5: Supervision    Ramp Details (indicate cue type and reason) Firm surface and on blue mat, slow but no LOBs    Curb 5: Supervision    Curb Details (indicate cue type and reason) Slows prior to stepping on/off    Gait Comments stepping up/over obstacles x4 -- LOB when stepping over with R foot. Increased L LE fatigue after ambulation      Knee/Hip Exercises: Aerobic   Other Aerobic Scifit L6, arms 6 x  5 min      Knee/Hip Exercises: Standing   Heel Raises Right;Left;10 reps;3 sets    Forward Lunges Right;Left;2 sets;10 reps    Step Down Right;Left;10 reps;Step Height: 6";Hand Hold: 0;2 sets                 Balance Exercises - 02/13/21 0001       Balance Exercises: Standing   Other Standing Exercises forward and backward walking x 4 reps across blue and red mat    Other Standing Exercises Comments grape vine x4 reps across blue and red mat (4 LOBs)                 PT Short Term Goals - 02/06/21 1146       PT SHORT TERM GOAL #1   Title Pt will demonstrate independence with initial HEP    Time 4    Period Weeks    Status Achieved    Target Date 02/11/21      PT SHORT TERM GOAL #2   Title Pt will improve five time sit to stand to </= 13.5 seconds with no anterior LOB upon rising    Baseline 14 seconds with anterior LOB when  standing; 11 sec on 02/06/21    Time 4    Period Weeks    Status Achieved    Target Date 02/11/21      PT SHORT TERM GOAL #3   Title Pt will improve gait velocity without cane to >/= 2.62 ft/sec    Baseline 2.05 ft/sec without cane; 2.2 ft/sec with no a/d on 02/06/21    Time 4    Period Weeks    Status Partially Met    Target Date 02/11/21      PT SHORT TERM GOAL #4   Title Pt will increase DGI score by 4 points to indicate decreased falls risk    Baseline 13/24; 20/24 on 02/06/21    Time 4    Period Weeks    Status Achieved    Target Date 02/11/21      PT SHORT TERM GOAL #5   Title Pt will negotiate 4 stairs with one rail (R to ascend) with alternating sequence and min A (no cane)    Baseline met on 7/1    Time 4    Period Weeks    Status Achieved    Target Date 02/11/21               PT Long Term Goals - 02/06/21 1113       PT LONG TERM GOAL #1   Title Pt will demonstrate independence with final HEP    Time 8    Period Weeks    Status On-going      PT LONG TERM GOAL #2   Title Pt will increase gait velocity without cane to >/= 3.0 ft/sec with increased L arm swing    Baseline 2.22 ft/sec    Time 8    Period Weeks    Status On-going      PT LONG TERM GOAL #3   Title Pt will increase DGI score to >/= 20/24 without use of cane    Baseline 20/24 on 02/06/21    Time 8    Period Weeks    Status On-going      PT LONG TERM GOAL #4   Title Pt will negotiate 4 stairs with 1 rail, alternating sequence MOD I (no cane) to improve safety with home entry/exit  Time 8    Period Weeks    Status On-going      PT LONG TERM GOAL #5   Title Pt will demonstrate ability to perform functional bimanual task safely and independently without having to look at LUE when performing    Time 8    Period Weeks    Status On-going                   Plan - 02/13/21 1622     Clinical Impression Statement Treatment focused on working on pt's dynamic gait, balance,  strength, and ambulating on a variety of surfaces. Initiated forward lunge and single leg heel raise with good patient tolerance. Pt fatigued after outdoor ambulation with noticeable decreased L LE weightshift and step length. Pt currently needs to slow down prior to obstacles such as curbs/steps and when transitioning to different surfaces.    Personal Factors and Comorbidities Comorbidity 3+;Profession    Comorbidities Kidney CA, HIV, bronchitis, HTN, renal insufficiency, nephrectomy    Examination-Activity Limitations Bend;Carry;Dressing;Lift;Locomotion Level;Reach Overhead;Stairs    Examination-Participation Restrictions Community Activity;Driving;Occupation    Stability/Clinical Decision Making Evolving/Moderate complexity    Rehab Potential Good    PT Frequency 3x / week    PT Duration 8 weeks    PT Treatment/Interventions ADLs/Self Care Home Management;Gait training;Stair training;Functional mobility training;Therapeutic activities;Therapeutic exercise;Balance training;Neuromuscular re-education;Patient/family education;Orthotic Fit/Training;Vestibular;Visual/perceptual remediation/compensation    PT Next Visit Plan Continue to work on functional gait, endurance, balance, coordination of L LE. Continue practicing gait on different surfaces (outdoor, indoor compliant surfaces, ramp/inclines) with obstacles (i.e. steps, curbs, stepping over objects). Continue lunging and single leg heel raise - working towards being able to eventually perform plyometrics with goal of jogging/running.    PT Home Exercise Plan Access Code: [email or web address]    Consulted and Agree with Plan of Care Patient             Patient will benefit from skilled therapeutic intervention in order to improve the following deficits and impairments:  Abnormal gait, Decreased activity tolerance, Decreased balance, Decreased coordination, Difficulty walking, Impaired UE functional use, Impaired vision/preception  Visit  Diagnosis: Other lack of coordination  Other abnormalities of gait and mobility  Unsteadiness on feet     Problem List Patient Active Problem List   Diagnosis Date Noted   Cerebellar stroke ([facility]) 01/03/2021   COVID-19 virus infection 01/03/2021   Diverticulitis of large intestine with abscess 09/27/2020   Depression 06/19/2019   Other insomnia 06/19/2019   Chronic kidney disease, stage 3a ([facility]) 04/15/2018   Tobacco dependence 01/13/2018   Primary osteoarthritis of right knee 04/03/2017   Dyslipidemia 06/14/2016   Renal cell carcinoma of right kidney ([facility]) 08/19/2013   Renal cyst 05/21/2013   Obesity (BMI 30-39.9) 05/26/2012   Internal hemorrhoids 12/01/2008   Hypertension 04/28/2008   Major depressive disorder, single episode, severe ([facility]) 12/20/2006   Human immunodeficiency virus (HIV) disease ([facility]) 08/28/2006    Tahirih Lair April Ma L Tjay Velazquez PT, DPT 02/13/2021, 4:27 PM  [facility] [address] [place], [place], [address] Phone: [phone]   Fax:  [phone]  Name: Drew Cuevas MRN: [id number] Date of Birth: [birth year]

## 2021-02-14 ENCOUNTER — Encounter: Payer: 59 | Admitting: Physical Therapy

## 2021-02-16 ENCOUNTER — Encounter: Payer: Self-pay | Admitting: Occupational Therapy

## 2021-02-16 ENCOUNTER — Other Ambulatory Visit: Payer: Self-pay

## 2021-02-16 ENCOUNTER — Ambulatory Visit: Payer: 59 | Admitting: Rehabilitative and Restorative Service Providers"

## 2021-02-16 ENCOUNTER — Ambulatory Visit: Payer: 59 | Admitting: Occupational Therapy

## 2021-02-16 DIAGNOSIS — R2689 Other abnormalities of gait and mobility: Secondary | ICD-10-CM

## 2021-02-16 DIAGNOSIS — R482 Apraxia: Secondary | ICD-10-CM

## 2021-02-16 DIAGNOSIS — I69854 Hemiplegia and hemiparesis following other cerebrovascular disease affecting left non-dominant side: Secondary | ICD-10-CM

## 2021-02-16 DIAGNOSIS — R278 Other lack of coordination: Secondary | ICD-10-CM | POA: Diagnosis not present

## 2021-02-16 DIAGNOSIS — R41840 Attention and concentration deficit: Secondary | ICD-10-CM

## 2021-02-16 DIAGNOSIS — R2681 Unsteadiness on feet: Secondary | ICD-10-CM

## 2021-02-16 DIAGNOSIS — R270 Ataxia, unspecified: Secondary | ICD-10-CM

## 2021-02-16 DIAGNOSIS — M6281 Muscle weakness (generalized): Secondary | ICD-10-CM

## 2021-02-16 NOTE — Therapy (Signed)
[facility] [address] [place], [place], [address] Phone: [phone]   Fax:  [phone]  Physical Therapy Treatment  Patient Details  Name: Drew Cuevas MRN: [id number] Date of Birth: Oct 09, 1970 Referring Provider (PT): Referred by hospitalist; will send to PCP: Ladell Pier, MD   Encounter Date: 02/16/2021   PT End of Session - 02/16/21 1447     Visit Number 12    Number of Visits 17    Date for PT Re-Evaluation 03/13/21    Authorization Type [employer]; 30 VL - authorization required after 20th visit    Authorization - Visit Number 12    Authorization - Number of Visits 20    Progress Note Due on Visit 20    PT Start Time 0093    PT Stop Time 1530    PT Time Calculation (min) 43 min    Equipment Utilized During Treatment Gait belt    Activity Tolerance Patient tolerated treatment well    Behavior During Therapy WFL for tasks assessed/performed             Past Medical History:  Diagnosis Date   Bronchitis    LAST FLARE UP WAS NEW YEARS 2015   Cancer ([place])    kidney   GERD (gastroesophageal reflux disease)    NO MEDS   HIV (human immunodeficiency virus infection) ([place])    UNDER CONTROL WITH MEDICATIONS   Hypertension    Immune deficiency disorder ([place])    Renal insufficiency 05/17/2015   Renal mass, right     Past Surgical History:  Procedure Laterality Date   NO PAST SURGERIES     ROBOT ASSISTED LAPAROSCOPIC NEPHRECTOMY Right 08/19/2013   Procedure: ROBOTIC ASSISTED LAPAROSCOPIC RIGHT PARTIAL NEPHRECTOMY;  Surgeon: Ardis Hughs, MD;  Location: [facility];  Service: Urology;  Laterality: Right;    There were no vitals filed for this visit.   Subjective Assessment - 02/16/21 1449     Subjective The patient worked 7 hours today prior to PT.  He has been back for 3 weeks.    Pertinent History Kidney CA, HIV, bronchitis, HTN, renal insufficiency, nephrectomy    Patient Stated  Goals Walking better, walking without the cane    Currently in Pain? No/denies                [facility] PT Assessment - 02/16/21 1454       Assessment   Medical Diagnosis Cerebellar CVA    Referring Provider (PT) Referred by hospitalist; will send to PCP: Ladell Pier, MD    Onset Date/Surgical Date 01/05/21                           [facility] Adult PT Treatment/Exercise - 02/16/21 1454       Ambulation/Gait   Ambulation/Gait Yes    Ambulation Distance (Feet) 300 Feet    Assistive device None    Ambulation Surface Indoor    Gait Comments multi      Neuro Re-ed    Neuro Re-ed Details  Standing on compliant foam with lateral stepping on/off of foam with SBA, then single leg stance on foam with vector reach laterally with R LE for toe taps.  Braiding x 20 feet with some dyscoordination noted L LE.  Rocker board standing lateral and anterior/posterior direction with R LE tapping for L leg stance with intermittent UE support. Standing on foam with lateral cone taping R and  L with CGA.  Quadriped UE/LE extension x 5 reps      Exercises   Exercises Knee/Hip      Knee/Hip Exercises: Stretches   Active Hamstring Stretch Left;1 rep;30 seconds      Knee/Hip Exercises: Aerobic   Tread Mill x 2 minutes at 1.6 to 2.0 mph with 3% incline with UE support      Knee/Hip Exercises: Standing   Heel Raises Left;10 reps    Heel Raises Limitations single leg hopping with UE support at countertop x 5 reps in L leg stance with bilat UE support for plyometric training    Forward Lunges Right;Left;10 reps    Forward Lunges Limitations runner's lunge                      PT Short Term Goals - 02/06/21 1146       PT SHORT TERM GOAL #1   Title Pt will demonstrate independence with initial HEP    Time 4    Period Weeks    Status Achieved    Target Date 02/11/21      PT SHORT TERM GOAL #2   Title Pt will improve five time sit to stand to </= 13.5 seconds with no  anterior LOB upon rising    Baseline 14 seconds with anterior LOB when standing; 11 sec on 02/06/21    Time 4    Period Weeks    Status Achieved    Target Date 02/11/21      PT SHORT TERM GOAL #3   Title Pt will improve gait velocity without cane to >/= 2.62 ft/sec    Baseline 2.05 ft/sec without cane; 2.2 ft/sec with no a/d on 02/06/21    Time 4    Period Weeks    Status Partially Met    Target Date 02/11/21      PT SHORT TERM GOAL #4   Title Pt will increase DGI score by 4 points to indicate decreased falls risk    Baseline 13/24; 20/24 on 02/06/21    Time 4    Period Weeks    Status Achieved    Target Date 02/11/21      PT SHORT TERM GOAL #5   Title Pt will negotiate 4 stairs with one rail (R to ascend) with alternating sequence and min A (no cane)    Baseline met on 7/1    Time 4    Period Weeks    Status Achieved    Target Date 02/11/21               PT Long Term Goals - 02/06/21 1113       PT LONG TERM GOAL #1   Title Pt will demonstrate independence with final HEP    Time 8    Period Weeks    Status On-going      PT LONG TERM GOAL #2   Title Pt will increase gait velocity without cane to >/= 3.0 ft/sec with increased L arm swing    Baseline 2.22 ft/sec    Time 8    Period Weeks    Status On-going      PT LONG TERM GOAL #3   Title Pt will increase DGI score to >/= 20/24 without use of cane    Baseline 20/24 on 02/06/21    Time 8    Period Weeks    Status On-going      PT LONG TERM GOAL #4   Title  Pt will negotiate 4 stairs with 1 rail, alternating sequence MOD I (no cane) to improve safety with home entry/exit    Time 8    Period Weeks    Status On-going      PT LONG TERM GOAL #5   Title Pt will demonstrate ability to perform functional bimanual task safely and independently without having to look at LUE when performing    Time 8    Period Weeks    Status On-going                   Plan - 02/16/21 1623     Clinical Impression  Statement The patient tolerated L LE loading and initiated plyometrics with UE supported single leg hopping today.  The patient has dec'd timing of L LE during gait and there is evidence of fatigue with activities.  PT to continue to progress to patient toleance.    PT Treatment/Interventions ADLs/Self Care Home Management;Gait training;Stair training;Functional mobility training;Therapeutic activities;Therapeutic exercise;Balance training;Neuromuscular re-education;Patient/family education;Orthotic Fit/Training;Vestibular;Visual/perceptual remediation/compensation    PT Next Visit Plan Continue to work on functional gait, endurance, balance, coordination of L LE. Continue practicing gait on different surfaces (outdoor, indoor compliant surfaces, ramp/inclines) with obstacles (i.e. steps, curbs, stepping over objects). Continue lunging and single leg heel raise - working towards being able to eventually perform plyometrics with goal of jogging/running.    PT Home Exercise Plan Access Code: [email or web address]    Consulted and Agree with Plan of Care Patient             Patient will benefit from skilled therapeutic intervention in order to improve the following deficits and impairments:     Visit Diagnosis: Other lack of coordination  Other abnormalities of gait and mobility  Unsteadiness on feet     Problem List Patient Active Problem List   Diagnosis Date Noted   Cerebellar stroke ([place]) 01/03/2021   COVID-19 virus infection 01/03/2021   Diverticulitis of large intestine with abscess 09/27/2020   Depression 06/19/2019   Other insomnia 06/19/2019   Chronic kidney disease, stage 3a ([place]) 04/15/2018   Tobacco dependence 01/13/2018   Primary osteoarthritis of right knee 04/03/2017   Dyslipidemia 06/14/2016   Renal cell carcinoma of right kidney ([place]) 08/19/2013   Renal cyst 05/21/2013   Obesity (BMI 30-39.9) 05/26/2012   Internal hemorrhoids 12/01/2008   Hypertension 04/28/2008   Major  depressive disorder, single episode, severe ([place]) 12/20/2006   Human immunodeficiency virus (HIV) disease ([place]) 08/28/2006    [place], PT 02/16/2021, 4:52 PM  [facility] [address] [place], [place], [address] Phone: [phone]   Fax:  [phone]  Name: Drew Cuevas MRN: [id number] Date of Birth: 1971-02-25

## 2021-02-16 NOTE — Therapy (Signed)
[facility] [address] [place], [place], [address] Phone: [phone]   Fax:  [phone]  Occupational Therapy Evaluation  Patient Details  Name: Drew Cuevas MRN: [id number] Date of Birth: Dec 02, 1970 Referring Provider (OT): Karle Plumber, MD   Encounter Date: 02/16/2021   OT End of Session - 02/16/21 1530     Visit Number 1    Number of Visits 13    Date for OT Re-Evaluation 04/13/21   12 visits over 8 weeks d/t any missed visits and/or scheduling conflicts. May d/c early based on progress.   Authorization Type [employer]    Authorization Time Period VL: 30 PT/OT/Chiro    OT Start Time 1530    OT Stop Time 1608    OT Time Calculation (min) 38 min    Activity Tolerance Patient tolerated treatment well    Behavior During Therapy WFL for tasks assessed/performed             Past Medical History:  Diagnosis Date   Bronchitis    LAST FLARE UP WAS NEW YEARS 2015   Cancer ([place])    kidney   GERD (gastroesophageal reflux disease)    NO MEDS   HIV (human immunodeficiency virus infection) ([place])    UNDER CONTROL WITH MEDICATIONS   Hypertension    Immune deficiency disorder ([place])    Renal insufficiency 05/17/2015   Renal mass, right     Past Surgical History:  Procedure Laterality Date   NO PAST SURGERIES     ROBOT ASSISTED LAPAROSCOPIC NEPHRECTOMY Right 08/19/2013   Procedure: ROBOTIC ASSISTED LAPAROSCOPIC RIGHT PARTIAL NEPHRECTOMY;  Surgeon: Ardis Hughs, MD;  Location: [facility];  Service: Urology;  Laterality: Right;    There were no vitals filed for this visit.   Subjective Assessment - 02/16/21 1625     Subjective  Pt is a 50 year old male that presents to Neuro OPOT s/p cerebellar CVA 01/05/21 and with residual LUE weakness, apraxia and ataxia impeding overall coordination and functional use of LUE impeding overall independence with ADLs and IADLs. Pt reports difficulty with his left hand and  hard to coordinate. Pt reports "I can't grip". Pt's primary goal is to be able to "grip things better".    Pertinent History PMH: Covd, kidney CA, HIV, Bronchitis, HTN, renal insuffiency, nephrectomy    Limitations Fall Risk. HIV    Patient Stated Goals "grip things better"    Currently in Pain? No/denies               [facility] OT Assessment - 02/16/21 1526       Assessment   Medical Diagnosis Cerebellar CVA    Referring Provider (OT) Karle Plumber, MD    Onset Date/Surgical Date 01/05/21    Hand Dominance Right      Precautions   Precautions Fall    Precaution Comments HIV+, Kidney CA (not active)      Balance Screen   Has the patient fallen in the past 6 months No      Home  Environment   Family/patient expects to be discharged to: Private residence    Living Arrangements Spouse/significant other    Available Help at Discharge Family    Type of [place]   3   [place] One level    Bathroom Shower/Tub Tub/Shower unit    [place] bench      Prior Function   Level of [place]  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)      ADL   Eating/Feeding Needs assist with cutting food    Grooming Modified independent    Upper Body Bathing Modified independent    Lower Body Bathing Modified independent    Upper Body Dressing Increased time    Lower Body Dressing Increased time    Toilet Transfer Modified independent    Toileting - [place] independent    [place] Transfer Modified independent      IADL   Prior Level of Function Shopping independent    Shopping Takes care of all shopping needs independently    Prior Level of Function Light Housekeeping did not complete prior    Light Housekeeping Does not participate in any housekeeping tasks    Prior Level of Function Meal  Prep independent    Meal Prep Prepares adequate meal if supplied with ingredients   not doing any cutting/chopping vegetables   Prior Level of Function Community Mobility drove prior    Programmer, applications own vehicle    Medication Management Is responsible for taking medication in correct dosages at correct time    Physiological scientist financial matters independently (budgets, writes checks, pays rent, bills goes to bank), collects and keeps track of income      Written Expression   Dominant Hand Right    Handwriting 100% legible      Vision - History   Baseline Vision Wears glasses all the time      Sensation   Light Touch Appears Intact    Hot/Cold Appears Intact    Proprioception Appears Intact      Coordination   Finger Nose Finger Test ataxia noted LUE    9 Hole Peg Test Right;Left    Right 9 Hole Peg Test 18.53s    Left 9 Hole Peg Test 50.59s    Box and Blocks R 66, L 31   undershoots LUE     Perception   Perception Impaired    Inattention/Neglect Does not attend to left side of body   minimal     Praxis   Praxis Impaired      ROM / Strength   AROM / PROM / Strength AROM;Strength      AROM   Overall AROM  Within functional limits for tasks performed      Strength   Overall Strength Within functional limits for tasks performed      Hand Function   Right Hand Gross Grasp Functional    Right Hand Grip (lbs) 92.1    Left Hand Gross Grasp Functional    Left Hand Grip (lbs) 61.7                               OT Short Term Goals - 02/16/21 1649       OT SHORT TERM GOAL #1   Title Pt will be independent with HEP targeting LUE coordination    Time 4    Period Weeks    Status New    Target Date 03/16/21      OT SHORT TERM GOAL #2   Title Pt will verbalize understanding of adapted strategies with cooking and other ADLs and IADLs with LUE to increase independence and safety with work requirements.    Time 4  Period Weeks     Status New      OT SHORT TERM GOAL #3   Title Pt will be independent with HEP for LUE grip strengthening.    Time 4    Period Weeks    Status New      OT SHORT TERM GOAL #4   Title Pt will improve Box and Blocks score to 37 blocks or more with LUE.    Baseline L 31 blocks    Time 4    Period Weeks    Status New               OT Long Term Goals - 02/16/21 1704       OT LONG TERM GOAL #1   Title Pt will increase grip strength in LUE to 70 lbs or greater for increase in functional use of LUE and increased coordination for job requirements.    Baseline L 61.7 lbs    Time 8    Period Weeks    Status New      OT LONG TERM GOAL #2   Title Pt will improve typing speed to 20 wpm net speed.    Baseline 11 wpm net speed    Time 8    Period Weeks    Status New      OT LONG TERM GOAL #3   Title Pt will complete 9 hole peg test with LUE in 42 seconds or less for increase in coordination with LUE.    Baseline L 50.59s    Time 8    Period Weeks    Status New      OT LONG TERM GOAL #4   Title Pt will ambulate around gym while carrying plate of items or cup of liquid in LUE without any spills or drops for increased attention to LUE.    Time 8    Period Weeks    Status New                   Plan - 02/16/21 1635     Clinical Impression Statement Pt is a 51 year old male that presents to Neuro OPOT s/p cerebellar CVA 01/05/21 and with residual LUE weakness, apraxia and ataxia impeding overall coordination and functional use of LUE impeding overall independence with ADLs and IADLs. Skilled occupational therapy is recommended to target listed areas of deficit and increase independence and return to maximum PLOF.    OT Occupational Profile and History Problem Focused Assessment - Including review of records relating to presenting problem    Occupational performance deficits (Please refer to evaluation for details): IADL's;ADL's;Work    Body Structure / Function / Physical  Skills ADL;Decreased knowledge of use of DME;Dexterity;Strength;UE functional use;FMC;Coordination;IADL    Rehab Potential Good    Clinical Decision Making Limited treatment options, no task modification necessary    Comorbidities Affecting Occupational Performance: None    Modification or Assistance to Complete Evaluation  No modification of tasks or assist necessary to complete eval    OT Frequency 2x / week    OT Duration 8 weeks   may discharge earlier based on progress. 12 visits over 8 weeks to allow for any missed visits/scheduling conflicts.   OT Treatment/Interventions Self-care/ADL training;Patient/family education;DME and/or AE instruction;Therapeutic exercise;Therapeutic activities;Cognitive remediation/compensation;Neuromuscular education    Plan HEP coordination, theraputty LUE    Consulted and Agree with Plan of Care Patient             Patient will benefit  from skilled therapeutic intervention in order to improve the following deficits and impairments:   Body Structure / Function / Physical Skills: ADL, Decreased knowledge of use of DME, Dexterity, Strength, UE functional use, FMC, Coordination, IADL       Visit Diagnosis: Other lack of coordination - Plan: Ot plan of care cert/re-cert  Muscle weakness (generalized) - Plan: Ot plan of care cert/re-cert  Unsteadiness on feet - Plan: Ot plan of care cert/re-cert  Other abnormalities of gait and mobility - Plan: Ot plan of care cert/re-cert  Attention and concentration deficit - Plan: Ot plan of care cert/re-cert  Ataxia - Plan: Ot plan of care cert/re-cert  Apraxia - Plan: Ot plan of care cert/re-cert  Hemiplegia and hemiparesis following other cerebrovascular disease affecting left non-dominant side ([place]) - Plan: Ot plan of care cert/re-cert    Problem List Patient Active Problem List   Diagnosis Date Noted   Cerebellar stroke ([place]) 01/03/2021   COVID-19 virus infection 01/03/2021   Diverticulitis of  large intestine with abscess 09/27/2020   Depression 06/19/2019   Other insomnia 06/19/2019   Chronic kidney disease, stage 3a ([place]) 04/15/2018   Tobacco dependence 01/13/2018   Primary osteoarthritis of right knee 04/03/2017   Dyslipidemia 06/14/2016   Renal cell carcinoma of right kidney ([place]) 08/19/2013   Renal cyst 05/21/2013   Obesity (BMI 30-39.9) 05/26/2012   Internal hemorrhoids 12/01/2008   Hypertension 04/28/2008   Major depressive disorder, single episode, severe ([place]) 12/20/2006   Human immunodeficiency virus (HIV) disease ([place]) 08/28/2006    Zachery Conch MOT, OTR/L  02/16/2021, 5:19 PM  [facility] [address] [place], [place], [address] Phone: [phone]   Fax:  [phone]  Name: Thelton Graca MRN: [id number] Date of Birth: 30-Mar-1971

## 2021-02-17 ENCOUNTER — Encounter: Payer: 59 | Admitting: Physical Therapy

## 2021-02-21 ENCOUNTER — Ambulatory Visit: Payer: 59 | Admitting: Occupational Therapy

## 2021-02-23 ENCOUNTER — Ambulatory Visit: Payer: 59 | Admitting: Occupational Therapy

## 2021-02-27 ENCOUNTER — Encounter: Payer: 59 | Admitting: Occupational Therapy

## 2021-03-03 ENCOUNTER — Other Ambulatory Visit: Payer: Self-pay

## 2021-03-03 ENCOUNTER — Ambulatory Visit: Payer: 59 | Attending: Internal Medicine | Admitting: Physical Therapy

## 2021-03-03 ENCOUNTER — Encounter: Payer: 59 | Admitting: Occupational Therapy

## 2021-03-03 DIAGNOSIS — R270 Ataxia, unspecified: Secondary | ICD-10-CM | POA: Diagnosis present

## 2021-03-03 DIAGNOSIS — I69854 Hemiplegia and hemiparesis following other cerebrovascular disease affecting left non-dominant side: Secondary | ICD-10-CM | POA: Insufficient documentation

## 2021-03-03 DIAGNOSIS — R278 Other lack of coordination: Secondary | ICD-10-CM | POA: Diagnosis present

## 2021-03-03 DIAGNOSIS — R2681 Unsteadiness on feet: Secondary | ICD-10-CM | POA: Insufficient documentation

## 2021-03-03 DIAGNOSIS — R2689 Other abnormalities of gait and mobility: Secondary | ICD-10-CM | POA: Insufficient documentation

## 2021-03-03 DIAGNOSIS — M6281 Muscle weakness (generalized): Secondary | ICD-10-CM | POA: Diagnosis present

## 2021-03-03 NOTE — Therapy (Signed)
[facility] [address] [place], [place], [address] Phone: [phone]   Fax:  [phone]  Physical Therapy Treatment and Re-Cert  Patient Details  Name: Sayed Piwowar MRN: [id number] Date of Birth: Aug 15, 1970 Referring Provider (PT): Referred by hospitalist; will send to PCP: Marcine Matar, MD   Encounter Date: 03/03/2021   PT End of Session - 03/03/21 1623     Visit Number 13    Number of Visits 17    Date for PT Re-Evaluation 04/14/21    Authorization Type [employer]; 30 VL - authorization required after 20th visit    Authorization - Visit Number 13    Authorization - Number of Visits 20    Progress Note Due on Visit 20    PT Start Time 1535    PT Stop Time 1615    PT Time Calculation (min) 40 min    Equipment Utilized During Treatment Gait belt    Activity Tolerance Patient tolerated treatment well    Behavior During Therapy WFL for tasks assessed/performed             Past Medical History:  Diagnosis Date   Bronchitis    LAST FLARE UP WAS NEW YEARS 2015   Cancer (HCC)    kidney   GERD (gastroesophageal reflux disease)    NO MEDS   HIV (human immunodeficiency virus infection) (HCC)    UNDER CONTROL WITH MEDICATIONS   Hypertension    Immune deficiency disorder (HCC)    Renal insufficiency 05/17/2015   Renal mass, right     Past Surgical History:  Procedure Laterality Date   NO PAST SURGERIES     ROBOT ASSISTED LAPAROSCOPIC NEPHRECTOMY Right 08/19/2013   Procedure: ROBOTIC ASSISTED LAPAROSCOPIC RIGHT PARTIAL NEPHRECTOMY;  Surgeon: Crist Fat, MD;  Location: [facility];  Service: Urology;  Laterality: Right;    There were no vitals filed for this visit.   Subjective Assessment - 03/03/21 1539     Subjective Pt states nothing new or different. Pt reports he can now put his pants on while standing. Pt reports doing exercises off/on. Pt states he feels his left hand has gotten good  enough that he can stop OT.    Patient is accompained by: Family member    Pertinent History Kidney CA, HIV, bronchitis, HTN, renal insufficiency, nephrectomy    Patient Stated Goals Walking better, walking without the cane    Currently in Pain? No/denies                               [facility] Adult PT Treatment/Exercise - 03/03/21 0001       Ambulation/Gait   Ambulation/Gait Assistance Details throughout gym in session    Gait Pattern Step-through pattern;Left steppage    Ambulation Surface Level;Indoor    Gait Comments Increased L hip/ankle ER with increased fatigue and decreased toe off (toes off mostly at big toe leading to increased ankle eversion during swing)      Knee/Hip Exercises: Stretches   Active Hamstring Stretch Left;1 rep;30 seconds    Quad Stretch Right;Left;30 seconds    Gastroc Stretch 30 seconds;Right;Left    Soleus Stretch Right;Left;30 seconds      Knee/Hip Exercises: Aerobic   Tread Mill x 8 minutes at 1.6 to 3.0 mph; 1 minute interval training increasing speed. 3% incline with UE support    Other Aerobic Scifit L1 x5 min for warmup  Knee/Hip Exercises: Plyometrics   Other Plyometric Exercises double leg hop x10 in place; attempted forward hop but L LE lags    Other Plyometric Exercises single leg skip 3x10 in agility ladder      Knee/Hip Exercises: Standing   Heel Raises Both;3 sets;10 reps    Heel Raises Limitations with tennis ball squeeze                      PT Short Term Goals - 02/06/21 1146       PT SHORT TERM GOAL #1   Title Pt will demonstrate independence with initial HEP    Time 4    Period Weeks    Status Achieved    Target Date 02/11/21      PT SHORT TERM GOAL #2   Title Pt will improve five time sit to stand to </= 13.5 seconds with no anterior LOB upon rising    Baseline 14 seconds with anterior LOB when standing; 11 sec on 02/06/21    Time 4    Period Weeks    Status Achieved    Target Date  02/11/21      PT SHORT TERM GOAL #3   Title Pt will improve gait velocity without cane to >/= 2.62 ft/sec    Baseline 2.05 ft/sec without cane; 2.2 ft/sec with no a/d on 02/06/21    Time 4    Period Weeks    Status Partially Met    Target Date 02/11/21      PT SHORT TERM GOAL #4   Title Pt will increase DGI score by 4 points to indicate decreased falls risk    Baseline 13/24; 20/24 on 02/06/21    Time 4    Period Weeks    Status Achieved    Target Date 02/11/21      PT SHORT TERM GOAL #5   Title Pt will negotiate 4 stairs with one rail (R to ascend) with alternating sequence and min A (no cane)    Baseline met on 7/1    Time 4    Period Weeks    Status Achieved    Target Date 02/11/21               PT Long Term Goals - 03/03/21 1627       PT LONG TERM GOAL #1   Title Pt will demonstrate independence with final HEP for jogging/running    Time --    Period --    Status Revised    Target Date 04/14/21      PT LONG TERM GOAL #2   Title Pt will increase gait velocity without cane to >/= 3.0 ft/sec with increased L arm swing    Baseline 2.22 ft/sec    Time --    Period --    Status Revised    Target Date 04/14/21      PT LONG TERM GOAL #3   Title Pt will increase DGI score to 24/24    Baseline 20/24 on 02/06/21    Time --    Period Weeks    Status Revised    Target Date 04/14/21      PT LONG TERM GOAL #4   Title Pt will negotiate 4 stairs with 1 rail, alternating sequence MOD I (no cane) to improve safety with home entry/exit    Time 8    Period Weeks    Status Achieved      PT LONG TERM GOAL #5  Title Pt will demonstrate ability to perform functional bimanual task safely and independently without having to look at LUE when performing    Time 8    Period Weeks    Status Achieved      Additional Long Term Goals   Additional Long Term Goals Yes      PT LONG TERM GOAL #6   Title Pt will be able to tolerate jogging for ~10 minutes    Baseline Currently  able to tolerate 3 intervals of ~1 min of increased speed at 2.5-3 mph    Time 6    Period Weeks    Status New    Target Date 04/14/21                   Plan - 03/03/21 1623     Clinical Impression Statement L LE continues to fatigue with increased difficulty of activity (demos increased L ankle/hip ER during swing with reduced toe off and an almost steppage pattern). Worked on progressing gait speed/jogging on treadmill this session. Continued to work on plyometrics. Pt able to perform hopping in place but L LE timing is too decreased when attempting to hop forward. Worked on increasing timing with single leg skipping/forward hop in a step to pattern. Pt interested in aquatics; will attempt to schedule for next week. Pt has been meeting his LTGs but wishes to work on improving L LE timing for jogging/running and jumping activities. Pt would benefit from continued visits of PT to work on higher level balance, coordination, and reach towards pt's personal goals.    Personal Factors and Comorbidities Comorbidity 3+;Profession    Comorbidities Kidney CA, HIV, bronchitis, HTN, renal insufficiency, nephrectomy    Examination-Activity Limitations Bend;Carry;Dressing;Lift;Locomotion Level;Reach Overhead;Stairs    Examination-Participation Restrictions Community Activity;Driving;Occupation    Rehab Potential Good    PT Frequency 2x / week    PT Duration 8 weeks    PT Treatment/Interventions ADLs/Self Care Home Management;Gait training;Stair training;Functional mobility training;Therapeutic activities;Therapeutic exercise;Balance training;Neuromuscular re-education;Patient/family education;Orthotic Fit/Training;Vestibular;Visual/perceptual remediation/compensation;Aquatic Therapy;Cryotherapy;Moist Heat    PT Next Visit Plan Continue to work on gait, endurance, balance, coordination of L LE. Continue practicing gait on different surfaces (outdoor, indoor compliant surfaces, ramp/inclines) with  obstacles (i.e. steps, curbs, stepping over objects). Continue lunging and single leg heel raise - working towards being able to eventually perform plyometrics with goal of jogging/running.    PT Home Exercise Plan Access Code: [email or web address]    Consulted and Agree with Plan of Care Patient             Patient will benefit from skilled therapeutic intervention in order to improve the following deficits and impairments:  Abnormal gait, Decreased activity tolerance, Decreased balance, Decreased coordination, Difficulty walking, Impaired UE functional use, Impaired vision/preception  Visit Diagnosis: Other lack of coordination  Muscle weakness (generalized)  Unsteadiness on feet  Other abnormalities of gait and mobility  Ataxia  Hemiplegia and hemiparesis following other cerebrovascular disease affecting left non-dominant side [facility])     Problem List Patient Active Problem List   Diagnosis Date Noted   Cerebellar stroke (HCC) 01/03/2021   COVID-19 virus infection 01/03/2021   Diverticulitis of large intestine with abscess 09/27/2020   Depression 06/19/2019   Other insomnia 06/19/2019   Chronic kidney disease, stage 3a (HCC) 04/15/2018   Tobacco dependence 01/13/2018   Primary osteoarthritis of right knee 04/03/2017   Dyslipidemia 06/14/2016   Renal cell carcinoma of right kidney (HCC) 08/19/2013   Renal cyst 05/21/2013   Obesity (  BMI 30-39.9) 05/26/2012   Internal hemorrhoids 12/01/2008   Hypertension 04/28/2008   Major depressive disorder, single episode, severe (HCC) 12/20/2006   Human immunodeficiency virus (HIV) disease (HCC) 08/28/2006    Demari Gales April Ma L Kennard Fildes PT, DPT 03/03/2021, 4:36 PM  [facility] [address] [place], [place], [address] Phone: [phone]   Fax:  [phone]  Name: Jarryd Cieslinski MRN: [id number] Date of Birth: [birth year]

## 2021-03-07 ENCOUNTER — Encounter: Payer: 59 | Admitting: Occupational Therapy

## 2021-03-07 ENCOUNTER — Ambulatory Visit: Payer: 59 | Admitting: Physical Therapy

## 2021-03-07 ENCOUNTER — Other Ambulatory Visit: Payer: Self-pay

## 2021-03-07 DIAGNOSIS — R2689 Other abnormalities of gait and mobility: Secondary | ICD-10-CM

## 2021-03-07 DIAGNOSIS — M6281 Muscle weakness (generalized): Secondary | ICD-10-CM

## 2021-03-07 DIAGNOSIS — R2681 Unsteadiness on feet: Secondary | ICD-10-CM

## 2021-03-07 DIAGNOSIS — R278 Other lack of coordination: Secondary | ICD-10-CM | POA: Diagnosis not present

## 2021-03-07 NOTE — Patient Instructions (Signed)
Aquatic Therapy at [facility]-  What to Expect!  Where:   [facility] [address] [place], [place] [address] Rehab phone [phone]  NOTE:  You will receive an automated phone message reminding you of your appt and it will say the appointment is at the [address] [facility] [address] [facility] clinic.          How to Prepare: Please make sure you drink 8 ounces of water about one hour prior to your pool session A caregiver may attend if needed with the patient to help assist as needed. A caregiver can sit in the pool room on chair. Please arrive IN YOUR SUIT and 15 minutes prior to your appointment - this helps to avoid delays in starting your session. Please make sure to attend to any toileting needs prior to entering the pool Locker rooms for changing are provided.   There is direct access to the pool deck form the locker room.  You can lock your belongings in a locker with lock provided. Once on the pool deck your therapist will ask if you have signed the Patient  Consent and Assignment of Benefits form before beginning treatment Your therapist may take your blood pressure prior to, during and after your session if indicated We usually try and create a home exercise program based on activities we do in the pool.  Please be thinking about who might be able to assist you in the pool should you need to participate in an aquatic home exercise program at the time of discharge if you need assistance.  Some patients do not want to or do not have the ability to participate in an aquatic home program - this is not a barrier in any way to you participating in aquatic therapy as part of your current therapy plan! After Discharge from PT, you can continue using home program at  the [facility]/, there is a drop-in fee for $5 ($45 a month)or for 60 years  or older $4.00 ($40 a month for seniors ) or any local [employer] pool.  Memberships for purchase are  available for gym/pool at [facility]  IT IS VERY IMPORTANT THAT YOUR LAST VISIT BE IN THE CLINIC AT [address] AFTER YOUR LAST AQUATIC VISIT.  PLEASE MAKE SURE THAT YOU HAVE A [address]  APPOINTMENT SCHEDULED.   About the pool: Pool is located approximately 500 FT from the entrance of the building.  Please bring a support person if you need assistance traveling this      distance.   Your therapist will assist you in entering the water; there are two ways to           enter: stairs with railings, and a mechanical lift. Your therapist will determine the most appropriate way for you.  Water temperature is usually between 88-90 degrees  There may be up to 2 other swimmers in the pool at the same time  The pool deck is tile, please wear shoes with good traction if you prefer not to be barefoot.    Contact Info:  For appointment scheduling and cancellations:         Please call the [facility]  PH:[phone]              Aquatic Therapy  Outpatient Rehabilitation @ [facility]       All sessions are 45 minutes                                                    

## 2021-03-07 NOTE — Therapy (Signed)
Period Weeks    Status New    Target Date 04/14/21                   Plan - 03/07/21 1549     Clinical Impression Statement Continued working on L LE timing and plyometrics. Discussed aquatic therapy. Increased time, incline and speed of pt's interval training on treadmill. Pt with improving L LE coordination/timing. He is now able to perform forward hop despite decreased L LE hop length compared to R.    Personal Factors and Comorbidities Comorbidity  3+;Profession    Comorbidities Kidney CA, HIV, bronchitis, HTN, renal insufficiency, nephrectomy    Examination-Activity Limitations Bend;Carry;Dressing;Lift;Locomotion Level;Reach Overhead;Stairs    Examination-Participation Restrictions Community Activity;Driving;Occupation    Rehab Potential Good    PT Frequency 2x / week    PT Duration 8 weeks    PT Treatment/Interventions ADLs/Self Care Home Management;Gait training;Stair training;Functional mobility training;Therapeutic activities;Therapeutic exercise;Balance training;Neuromuscular re-education;Patient/family education;Orthotic Fit/Training;Vestibular;Visual/perceptual remediation/compensation;Aquatic Therapy;Cryotherapy;Moist Heat    PT Next Visit Plan Continue to work on endurance, balance, coordination of L LE. Continue working on jogging/treadmill/plyometrics to improve L LE timing.    PT Home Exercise Plan Access Code: [email or web address]    Consulted and Agree with Plan of Care Patient             Patient will benefit from skilled therapeutic intervention in order to improve the following deficits and impairments:  Abnormal gait, Decreased activity tolerance, Decreased balance, Decreased coordination, Difficulty walking, Impaired UE functional use, Impaired vision/preception  Visit Diagnosis: Other lack of coordination  Muscle weakness (generalized)  Other abnormalities of gait and mobility  Unsteadiness on feet     Problem List Patient Active Problem List   Diagnosis Date Noted   Cerebellar stroke ([place]) 01/03/2021   COVID-19 virus infection 01/03/2021   Diverticulitis of large intestine with abscess 09/27/2020   Depression 06/19/2019   Other insomnia 06/19/2019   Chronic kidney disease, stage 3a ([place]) 04/15/2018   Tobacco dependence 01/13/2018   Primary osteoarthritis of right knee 04/03/2017   Dyslipidemia 06/14/2016   Renal cell carcinoma of right kidney ([place]) 08/19/2013   Renal cyst 05/21/2013   Obesity (BMI  30-39.9) 05/26/2012   Internal hemorrhoids 12/01/2008   Hypertension 04/28/2008   Major depressive disorder, single episode, severe ([place]) 12/20/2006   Human immunodeficiency virus (HIV) disease ([place]) 08/28/2006    Diasia Henken April Ma L Antavius Sperbeck PT, DPT 03/07/2021, 4:22 PM  [facility] [address] [place], [place], [address] Phone: [phone]   Fax:  [phone]  Name: Darryll Raju MRN: [id number] Date of Birth: [birth year]  [facility] [address] [place], [place], [address] Phone: [phone]   Fax:  [phone]  Physical Therapy Treatment  Patient Details  Name: Klayton Monie MRN: [id number] Date of Birth: 08-02-1970 Referring Provider (PT): Referred by hospitalist; will send to PCP: Ladell Pier, MD   Encounter Date: 03/07/2021   PT End of Session - 03/07/21 1621     Visit Number 14    Number of Visits 17    Date for PT Re-Evaluation 04/14/21    Authorization Type [employer]; 30 VL - authorization required after 20th visit    Authorization - Visit Number 9    Authorization - Number of Visits 20    Progress Note Due on Visit 20    PT Start Time 2202    PT Stop Time 1615    PT Time Calculation (min) 40 min    Activity Tolerance Patient tolerated treatment well    Behavior During Therapy [facility] for tasks assessed/performed             Past Medical History:  Diagnosis Date   Bronchitis    LAST FLARE UP WAS NEW YEARS 2015   Cancer ([place])    kidney   GERD (gastroesophageal reflux disease)    NO MEDS   HIV (human immunodeficiency virus infection) ([place])    UNDER CONTROL WITH MEDICATIONS   Hypertension    Immune deficiency disorder ([place])    Renal insufficiency 05/17/2015   Renal mass, right     Past Surgical History:  Procedure Laterality Date   NO PAST SURGERIES     ROBOT ASSISTED LAPAROSCOPIC NEPHRECTOMY Right 08/19/2013   Procedure: ROBOTIC ASSISTED LAPAROSCOPIC RIGHT PARTIAL NEPHRECTOMY;  Surgeon: Ardis Hughs, MD;  Location: [facility];  Service: Urology;  Laterality: Right;    There were no vitals filed for this visit.                      [facility] Adult PT Treatment/Exercise - 03/07/21 0001       Ambulation/Gait   Gait Pattern Step-through pattern    Ambulation Surface Level;Indoor    Gait Comments Increased L hip/ankle ER with increased fatigue and decreased toe off (toes off mostly at  big toe leading to increased ankle eversion during swing)      Knee/Hip Exercises: Aerobic   Tread Mill x 10 minutes at 1.8 to 3.2 mph; 1 minute interval training increasing speed. 4% incline with UE support      Knee/Hip Exercises: Plyometrics   Other Plyometric Exercises hop forward feet together, hop forward feet apart 3x9'    Other Plyometric Exercises single leg skip 2x10 in agility ladder      Knee/Hip Exercises: Standing   Heel Raises Both;3 sets;10 reps    Heel Raises Limitations heels hanging off step    Forward Lunges Right;Left;10 reps    Other Standing Knee Exercises Mountain climbers at counter 3x30 sec                    PT Education - 03/07/21 1548     Education Details Discussed pool and aquatics. Provided pt handout for [facility].    Person(s) Educated Patient    Methods Explanation;Handout    Comprehension Verbalized understanding              PT Short Term Goals - 02/06/21 1146       PT SHORT TERM GOAL #1   Title Pt will demonstrate  [facility] [address] [place], [place], [address] Phone: [phone]   Fax:  [phone]  Physical Therapy Treatment  Patient Details  Name: Klayton Monie MRN: [id number] Date of Birth: 08-02-1970 Referring Provider (PT): Referred by hospitalist; will send to PCP: Ladell Pier, MD   Encounter Date: 03/07/2021   PT End of Session - 03/07/21 1621     Visit Number 14    Number of Visits 17    Date for PT Re-Evaluation 04/14/21    Authorization Type [employer]; 30 VL - authorization required after 20th visit    Authorization - Visit Number 9    Authorization - Number of Visits 20    Progress Note Due on Visit 20    PT Start Time 2202    PT Stop Time 1615    PT Time Calculation (min) 40 min    Activity Tolerance Patient tolerated treatment well    Behavior During Therapy [facility] for tasks assessed/performed             Past Medical History:  Diagnosis Date   Bronchitis    LAST FLARE UP WAS NEW YEARS 2015   Cancer ([place])    kidney   GERD (gastroesophageal reflux disease)    NO MEDS   HIV (human immunodeficiency virus infection) ([place])    UNDER CONTROL WITH MEDICATIONS   Hypertension    Immune deficiency disorder ([place])    Renal insufficiency 05/17/2015   Renal mass, right     Past Surgical History:  Procedure Laterality Date   NO PAST SURGERIES     ROBOT ASSISTED LAPAROSCOPIC NEPHRECTOMY Right 08/19/2013   Procedure: ROBOTIC ASSISTED LAPAROSCOPIC RIGHT PARTIAL NEPHRECTOMY;  Surgeon: Ardis Hughs, MD;  Location: [facility];  Service: Urology;  Laterality: Right;    There were no vitals filed for this visit.                      [facility] Adult PT Treatment/Exercise - 03/07/21 0001       Ambulation/Gait   Gait Pattern Step-through pattern    Ambulation Surface Level;Indoor    Gait Comments Increased L hip/ankle ER with increased fatigue and decreased toe off (toes off mostly at  big toe leading to increased ankle eversion during swing)      Knee/Hip Exercises: Aerobic   Tread Mill x 10 minutes at 1.8 to 3.2 mph; 1 minute interval training increasing speed. 4% incline with UE support      Knee/Hip Exercises: Plyometrics   Other Plyometric Exercises hop forward feet together, hop forward feet apart 3x9'    Other Plyometric Exercises single leg skip 2x10 in agility ladder      Knee/Hip Exercises: Standing   Heel Raises Both;3 sets;10 reps    Heel Raises Limitations heels hanging off step    Forward Lunges Right;Left;10 reps    Other Standing Knee Exercises Mountain climbers at counter 3x30 sec                    PT Education - 03/07/21 1548     Education Details Discussed pool and aquatics. Provided pt handout for [facility].    Person(s) Educated Patient    Methods Explanation;Handout    Comprehension Verbalized understanding              PT Short Term Goals - 02/06/21 1146       PT SHORT TERM GOAL #1   Title Pt will demonstrate

## 2021-03-08 ENCOUNTER — Ambulatory Visit: Payer: Self-pay | Admitting: Physical Therapy

## 2021-03-08 DIAGNOSIS — R278 Other lack of coordination: Secondary | ICD-10-CM | POA: Diagnosis not present

## 2021-03-08 DIAGNOSIS — R2681 Unsteadiness on feet: Secondary | ICD-10-CM

## 2021-03-08 DIAGNOSIS — R2689 Other abnormalities of gait and mobility: Secondary | ICD-10-CM

## 2021-03-09 NOTE — Therapy (Signed)
[facility] Outpt Rehabilitation [facility] [address] [place], [place], [address] Phone: [phone]   Fax:  [phone]  Physical Therapy Treatment  Patient Details  Name: Drew Cuevas MRN: [id number] Date of Birth: 1971/04/06 Referring Provider (PT): Referred by hospitalist; will send to PCP: Marcine Matar, MD   Encounter Date: 03/08/2021   PT End of Session - 03/09/21 1944     Visit Number 15    Number of Visits 17    Date for PT Re-Evaluation 04/14/21    Authorization Type [employer]; 30 VL - authorization required after 20th visit    Authorization - Visit Number 15    Authorization - Number of Visits 20    Progress Note Due on Visit 20    PT Start Time 1420    PT Stop Time 1505    PT Time Calculation (min) 45 min    Equipment Utilized During Treatment Other (comment)   floatation belt, bar bells   Activity Tolerance Patient tolerated treatment well    Behavior During Therapy [facility] for tasks assessed/performed             Past Medical History:  Diagnosis Date   Bronchitis    LAST FLARE UP WAS NEW YEARS 2015   Cancer (HCC)    kidney   GERD (gastroesophageal reflux disease)    NO MEDS   HIV (human immunodeficiency virus infection) (HCC)    UNDER CONTROL WITH MEDICATIONS   Hypertension    Immune deficiency disorder (HCC)    Renal insufficiency 05/17/2015   Renal mass, right     Past Surgical History:  Procedure Laterality Date   NO PAST SURGERIES     ROBOT ASSISTED LAPAROSCOPIC NEPHRECTOMY Right 08/19/2013   Procedure: ROBOTIC ASSISTED LAPAROSCOPIC RIGHT PARTIAL NEPHRECTOMY;  Surgeon: Crist Fat, MD;  Location: [facility];  Service: Urology;  Laterality: Right;    There were no vitals filed for this visit.   Subjective Assessment - 03/09/21 1940     Subjective Pt presents for aquatic therapy at [facility] - states he wants to be able to run again - reports it has been about 15 yrs since he has  been in a pool    Patient is accompained by: Family member    Pertinent History Kidney CA, HIV, bronchitis, HTN, renal insufficiency, nephrectomy    Patient Stated Goals Walking better, walking without the cane    Currently in Pain? No/denies             Aquatic therapy at [facility] - pool temp 88 degrees  Patient seen for aquatic therapy today.  Treatment took place in water 3.8-4.6 feet deep depending upon activity.  Pt entered the pool via  Step negotiation independently.  Pt performed water walking forwards 18' x 4 reps for warm up; backwards walking 18' x 2 reps; sideways amb. 18' x 2 reps without UE support  Pt performed marching in place with UE support on pool edge initially, progressing to marching without UE support  Marching forwards 18' across pool with bar bells for support; marching at faster speed with UE support on bar bells for assist with balance as well as for increased resistance for strengthening and challenge with balance  Pt performed jogging in place with UE support on bar bells initially, progressing to no UE support  Pt performed jogging across pool 18' x 4 reps with cues to lift LLE and with cues to increase speed  Jumping bil. LE's 10 reps  without UE support; unilateral hopping LLE 10 reps with moderate difficulty reported  1/2 jumping jacks 10 reps; cross country skiing 10 reps with use of bar bells for support  Pt requires buoyancy of water for support and safety with plyometrics which can not be performed safely without fall risk on land; viscosity of water is  Needed for strengthening as it provides resistance with movements                             PT Short Term Goals - 03/09/21 1952       PT SHORT TERM GOAL #1   Title Pt will demonstrate independence with initial HEP    Time 4    Period Weeks    Status Achieved    Target Date 02/11/21      PT SHORT TERM GOAL #2   Title Pt will improve five time sit to stand to  </= 13.5 seconds with no anterior LOB upon rising    Baseline 14 seconds with anterior LOB when standing; 11 sec on 02/06/21    Time 4    Period Weeks    Status Achieved    Target Date 02/11/21      PT SHORT TERM GOAL #3   Title Pt will improve gait velocity without cane to >/= 2.62 ft/sec    Baseline 2.05 ft/sec without cane; 2.2 ft/sec with no a/d on 02/06/21    Time 4    Period Weeks    Status Partially Met    Target Date 02/11/21      PT SHORT TERM GOAL #4   Title Pt will increase DGI score by 4 points to indicate decreased falls risk    Baseline 13/24; 20/24 on 02/06/21    Time 4    Period Weeks    Status Achieved    Target Date 02/11/21      PT SHORT TERM GOAL #5   Title Pt will negotiate 4 stairs with one rail (R to ascend) with alternating sequence and min A (no cane)    Baseline met on 7/1    Time 4    Period Weeks    Status Achieved    Target Date 02/11/21               PT Long Term Goals - 03/09/21 1952       PT LONG TERM GOAL #1   Title Pt will demonstrate independence with final HEP for jogging/running    Status Revised      PT LONG TERM GOAL #2   Title Pt will increase gait velocity without cane to >/= 3.0 ft/sec with increased L arm swing    Baseline 2.22 ft/sec    Status Revised      PT LONG TERM GOAL #3   Title Pt will increase DGI score to 24/24    Baseline 20/24 on 02/06/21    Period Weeks    Status Revised      PT LONG TERM GOAL #4   Title Pt will negotiate 4 stairs with 1 rail, alternating sequence MOD I (no cane) to improve safety with home entry/exit    Time 8    Period Weeks    Status Achieved      PT LONG TERM GOAL #5   Title Pt will demonstrate ability to perform functional bimanual task safely and independently without having to look at LUE when performing    Time 8  Period Weeks    Status Achieved      PT LONG TERM GOAL #6   Title Pt will be able to tolerate jogging for ~10 minutes    Baseline Currently able to tolerate 3  intervals of ~1 min of increased speed at 2.5-3 mph    Time 6    Period Weeks    Status New                   Plan - 03/09/21 1945     Clinical Impression Statement Aquatic therapy session focused on high level balance and coordination activities for LLE as well as plyometrics including jumping and jogging.  LLE is slightly decreased in timing and motor control and coordination compared to RLE.  Pt improved with plyometrics with practice and repetition.  Pt had most difficulty with LLE single limb hopping.  Cont with POC.    Personal Factors and Comorbidities Comorbidity 3+;Profession    Comorbidities Kidney CA, HIV, bronchitis, HTN, renal insufficiency, nephrectomy    Examination-Activity Limitations Bend;Carry;Dressing;Lift;Locomotion Level;Reach Overhead;Stairs    Examination-Participation Restrictions Community Activity;Driving;Occupation    Rehab Potential Good    PT Frequency 2x / week    PT Duration 8 weeks    PT Treatment/Interventions ADLs/Self Care Home Management;Gait training;Stair training;Functional mobility training;Therapeutic activities;Therapeutic exercise;Balance training;Neuromuscular re-education;Patient/family education;Orthotic Fit/Training;Vestibular;Visual/perceptual remediation/compensation;Aquatic Therapy;Cryotherapy;Moist Heat    PT Next Visit Plan Continue to work on endurance, balance, coordination of L LE. Continue working on jogging/treadmill/plyometrics to improve L LE timing.    PT Home Exercise Plan Access Code: [email or web address]    Consulted and Agree with Plan of Care Patient             Patient will benefit from skilled therapeutic intervention in order to improve the following deficits and impairments:  Abnormal gait, Decreased activity tolerance, Decreased balance, Decreased coordination, Difficulty walking, Impaired UE functional use, Impaired vision/preception  Visit Diagnosis: Other lack of coordination  Other abnormalities of gait and  mobility  Unsteadiness on feet     Problem List Patient Active Problem List   Diagnosis Date Noted   Cerebellar stroke (HCC) 01/03/2021   COVID-19 virus infection 01/03/2021   Diverticulitis of large intestine with abscess 09/27/2020   Depression 06/19/2019   Other insomnia 06/19/2019   Chronic kidney disease, stage 3a (HCC) 04/15/2018   Tobacco dependence 01/13/2018   Primary osteoarthritis of right knee 04/03/2017   Dyslipidemia 06/14/2016   Renal cell carcinoma of right kidney (HCC) 08/19/2013   Renal cyst 05/21/2013   Obesity (BMI 30-39.9) 05/26/2012   Internal hemorrhoids 12/01/2008   Hypertension 04/28/2008   Major depressive disorder, single episode, severe (HCC) 12/20/2006   Human immunodeficiency virus (HIV) disease (HCC) 08/28/2006    Conal Shetley, Donavan Burnet, PT, ATRIC 03/09/2021, 7:56 PM   Outpt Rehabilitation [facility] [address] [place], [place], [address] Phone: [phone]   Fax:  [phone]  Name: Drew Cuevas MRN: [id number] Date of Birth: 1970/09/13

## 2021-03-10 ENCOUNTER — Encounter: Payer: 59 | Admitting: Occupational Therapy

## 2021-03-10 ENCOUNTER — Ambulatory Visit: Payer: 59 | Admitting: Physical Therapy

## 2021-03-13 ENCOUNTER — Other Ambulatory Visit: Payer: Self-pay

## 2021-03-13 ENCOUNTER — Ambulatory Visit: Payer: Self-pay | Admitting: Physical Therapy

## 2021-03-13 DIAGNOSIS — R2689 Other abnormalities of gait and mobility: Secondary | ICD-10-CM

## 2021-03-13 DIAGNOSIS — R278 Other lack of coordination: Secondary | ICD-10-CM

## 2021-03-13 DIAGNOSIS — R2681 Unsteadiness on feet: Secondary | ICD-10-CM

## 2021-03-14 ENCOUNTER — Ambulatory Visit: Payer: 59 | Admitting: Occupational Therapy

## 2021-03-14 NOTE — Therapy (Signed)
Physical Therapy Treatment  Patient Details  Name: Drew Cuevas MRN: [id number] Date of Birth: [birth year] Referring Provider (PT): Referred by hospitalist; will send to PCP: Ladell Pier, MD   Encounter Date: 03/13/2021   PT End of Session - 03/14/21 0803     Visit Number 16    Number of Visits 17    Date for PT Re-Evaluation 04/14/21    Authorization Type [employer]; 30 VL - authorization required after 20th visit    Authorization - Visit Number 4   OT visit included in total count   Authorization - Number of Visits 20    Progress Note Due on Visit 20    PT Start Time 1550    PT Stop Time 1635    PT Time Calculation (min) 45 min    Equipment Utilized During Treatment Other (comment)   bar bells, pool noodle   Activity Tolerance Patient tolerated treatment well    Behavior During Therapy WFL for tasks assessed/performed             Past Medical History:  Diagnosis Date   Bronchitis    LAST FLARE UP WAS NEW YEARS 2015   Cancer ([place])    kidney   GERD (gastroesophageal reflux disease)    NO MEDS   HIV (human immunodeficiency virus infection) ([place])    UNDER CONTROL WITH MEDICATIONS   Hypertension    Immune deficiency disorder ([place])    Renal insufficiency 05/17/2015   Renal mass, right     Past Surgical History:  Procedure Laterality Date   NO PAST SURGERIES     ROBOT ASSISTED LAPAROSCOPIC NEPHRECTOMY Right 08/19/2013   Procedure: ROBOTIC ASSISTED LAPAROSCOPIC RIGHT PARTIAL NEPHRECTOMY;  Surgeon: Ardis Hughs, MD;  Location: [facility];  Service: Urology;  Laterality: Right;    There were no vitals filed for this visit.   Subjective Assessment - 03/14/21 0759     Subjective Pt presents for aquatic therapy at [facility] - states he was a little tired after last week's pool session; pt states he worked alot this past weekend    Patient is accompained by: Family member    Pertinent History Kidney CA, HIV, bronchitis, HTN,  renal insufficiency, nephrectomy    Patient Stated Goals Walking better, walking without the cane    Currently in Pain? No/denies                  Aquatic therapy at [facility] - pool temp 90 degrees  Patient seen for aquatic therapy today.  Treatment took place in water 3.8-4.6 feet deep depending upon activity.  Pt entered the pool via  Step negotiation independently.  Pt performed water walking forwards 18' x 2 reps for warm up; backwards walking 18' x 2 reps; sideways amb. 18' x 2 reps without UE support  Pt performed marching in place without UE support 10 reps each leg  Marching forwards 18' across pool with bar bells for support; marching at faster speed with UE support with bar bells for assist with balance as well as for increased resistance for strengthening and increased current for perturbations for balance   Pt performed jogging in place with no UE support  Pt performed jogging across pool 18' x 2 reps with cues to lift LLE and with cues to increase speed  Jumping bil. LE's 10 reps without UE support; unilateral hopping LLE 10 reps with 1 UE support on pool edge; 10 reps attempted without  Fax:  [phone]  Name: Dohn Stclair MRN: [id number] Date of Birth: November 22, 1970  Physical Therapy Treatment  Patient Details  Name: Drew Cuevas MRN: [id number] Date of Birth: [birth year] Referring Provider (PT): Referred by hospitalist; will send to PCP: Ladell Pier, MD   Encounter Date: 03/13/2021   PT End of Session - 03/14/21 0803     Visit Number 16    Number of Visits 17    Date for PT Re-Evaluation 04/14/21    Authorization Type [employer]; 30 VL - authorization required after 20th visit    Authorization - Visit Number 4   OT visit included in total count   Authorization - Number of Visits 20    Progress Note Due on Visit 20    PT Start Time 1550    PT Stop Time 1635    PT Time Calculation (min) 45 min    Equipment Utilized During Treatment Other (comment)   bar bells, pool noodle   Activity Tolerance Patient tolerated treatment well    Behavior During Therapy WFL for tasks assessed/performed             Past Medical History:  Diagnosis Date   Bronchitis    LAST FLARE UP WAS NEW YEARS 2015   Cancer ([place])    kidney   GERD (gastroesophageal reflux disease)    NO MEDS   HIV (human immunodeficiency virus infection) ([place])    UNDER CONTROL WITH MEDICATIONS   Hypertension    Immune deficiency disorder ([place])    Renal insufficiency 05/17/2015   Renal mass, right     Past Surgical History:  Procedure Laterality Date   NO PAST SURGERIES     ROBOT ASSISTED LAPAROSCOPIC NEPHRECTOMY Right 08/19/2013   Procedure: ROBOTIC ASSISTED LAPAROSCOPIC RIGHT PARTIAL NEPHRECTOMY;  Surgeon: Ardis Hughs, MD;  Location: [facility];  Service: Urology;  Laterality: Right;    There were no vitals filed for this visit.   Subjective Assessment - 03/14/21 0759     Subjective Pt presents for aquatic therapy at [facility] - states he was a little tired after last week's pool session; pt states he worked alot this past weekend    Patient is accompained by: Family member    Pertinent History Kidney CA, HIV, bronchitis, HTN,  renal insufficiency, nephrectomy    Patient Stated Goals Walking better, walking without the cane    Currently in Pain? No/denies                  Aquatic therapy at [facility] - pool temp 90 degrees  Patient seen for aquatic therapy today.  Treatment took place in water 3.8-4.6 feet deep depending upon activity.  Pt entered the pool via  Step negotiation independently.  Pt performed water walking forwards 18' x 2 reps for warm up; backwards walking 18' x 2 reps; sideways amb. 18' x 2 reps without UE support  Pt performed marching in place without UE support 10 reps each leg  Marching forwards 18' across pool with bar bells for support; marching at faster speed with UE support with bar bells for assist with balance as well as for increased resistance for strengthening and increased current for perturbations for balance   Pt performed jogging in place with no UE support  Pt performed jogging across pool 18' x 2 reps with cues to lift LLE and with cues to increase speed  Jumping bil. LE's 10 reps without UE support; unilateral hopping LLE 10 reps with 1 UE support on pool edge; 10 reps attempted without  Physical Therapy Treatment  Patient Details  Name: Drew Cuevas MRN: [id number] Date of Birth: [birth year] Referring Provider (PT): Referred by hospitalist; will send to PCP: Ladell Pier, MD   Encounter Date: 03/13/2021   PT End of Session - 03/14/21 0803     Visit Number 16    Number of Visits 17    Date for PT Re-Evaluation 04/14/21    Authorization Type [employer]; 30 VL - authorization required after 20th visit    Authorization - Visit Number 4   OT visit included in total count   Authorization - Number of Visits 20    Progress Note Due on Visit 20    PT Start Time 1550    PT Stop Time 1635    PT Time Calculation (min) 45 min    Equipment Utilized During Treatment Other (comment)   bar bells, pool noodle   Activity Tolerance Patient tolerated treatment well    Behavior During Therapy WFL for tasks assessed/performed             Past Medical History:  Diagnosis Date   Bronchitis    LAST FLARE UP WAS NEW YEARS 2015   Cancer ([place])    kidney   GERD (gastroesophageal reflux disease)    NO MEDS   HIV (human immunodeficiency virus infection) ([place])    UNDER CONTROL WITH MEDICATIONS   Hypertension    Immune deficiency disorder ([place])    Renal insufficiency 05/17/2015   Renal mass, right     Past Surgical History:  Procedure Laterality Date   NO PAST SURGERIES     ROBOT ASSISTED LAPAROSCOPIC NEPHRECTOMY Right 08/19/2013   Procedure: ROBOTIC ASSISTED LAPAROSCOPIC RIGHT PARTIAL NEPHRECTOMY;  Surgeon: Ardis Hughs, MD;  Location: [facility];  Service: Urology;  Laterality: Right;    There were no vitals filed for this visit.   Subjective Assessment - 03/14/21 0759     Subjective Pt presents for aquatic therapy at [facility] - states he was a little tired after last week's pool session; pt states he worked alot this past weekend    Patient is accompained by: Family member    Pertinent History Kidney CA, HIV, bronchitis, HTN,  renal insufficiency, nephrectomy    Patient Stated Goals Walking better, walking without the cane    Currently in Pain? No/denies                  Aquatic therapy at [facility] - pool temp 90 degrees  Patient seen for aquatic therapy today.  Treatment took place in water 3.8-4.6 feet deep depending upon activity.  Pt entered the pool via  Step negotiation independently.  Pt performed water walking forwards 18' x 2 reps for warm up; backwards walking 18' x 2 reps; sideways amb. 18' x 2 reps without UE support  Pt performed marching in place without UE support 10 reps each leg  Marching forwards 18' across pool with bar bells for support; marching at faster speed with UE support with bar bells for assist with balance as well as for increased resistance for strengthening and increased current for perturbations for balance   Pt performed jogging in place with no UE support  Pt performed jogging across pool 18' x 2 reps with cues to lift LLE and with cues to increase speed  Jumping bil. LE's 10 reps without UE support; unilateral hopping LLE 10 reps with 1 UE support on pool edge; 10 reps attempted without

## 2021-03-16 ENCOUNTER — Encounter: Payer: 59 | Admitting: Occupational Therapy

## 2021-03-16 ENCOUNTER — Other Ambulatory Visit: Payer: Self-pay

## 2021-03-16 ENCOUNTER — Ambulatory Visit: Payer: 59 | Admitting: Physical Therapy

## 2021-03-16 ENCOUNTER — Encounter: Payer: Self-pay | Admitting: Physical Therapy

## 2021-03-16 DIAGNOSIS — R2681 Unsteadiness on feet: Secondary | ICD-10-CM

## 2021-03-16 DIAGNOSIS — R278 Other lack of coordination: Secondary | ICD-10-CM

## 2021-03-16 DIAGNOSIS — R2689 Other abnormalities of gait and mobility: Secondary | ICD-10-CM

## 2021-03-17 NOTE — Therapy (Addendum)
[facility] [address] [place], [place], [address] Phone: [phone]   Fax:  [phone]  Physical Therapy Treatment  Patient Details  Name: Drew Cuevas MRN: [id number] Date of Birth: [birth year] Referring Provider (PT): Referred by hospitalist; will send to PCP: Ladell Pier, MD   Encounter Date: 03/16/2021  Corrected by PT: Rico Junker, PT, DPT 03/19/21    2:02 PM    03/16/21 1534  PT Visits / Re-Eval  Visit Number 17  Number of Visits 25  Date for PT Re-Evaluation 19/37/90 (recert completed 09/03/07 by AN)  Authorization  Authorization Type [employer]; 30 VL - authorization required after 20th visit  Authorization - Visit Number 18 (OT visit included in total count)  Authorization - Number of Visits 20  Progress Note Due on Visit 20  PT Time Calculation  PT Start Time 1532  PT Stop Time 1615  PT Time Calculation (min) 43 min  PT - End of Session  Equipment Utilized During Treatment Gait belt  Activity Tolerance Patient tolerated treatment well  Behavior During Therapy [facility] for tasks assessed/performed     Past Medical History:  Diagnosis Date   Bronchitis    LAST FLARE UP WAS NEW YEARS 2015   Cancer ([place])    kidney   GERD (gastroesophageal reflux disease)    NO MEDS   HIV (human immunodeficiency virus infection) ([place])    UNDER CONTROL WITH MEDICATIONS   Hypertension    Immune deficiency disorder ([place])    Renal insufficiency 05/17/2015   Renal mass, right     Past Surgical History:  Procedure Laterality Date   NO PAST SURGERIES     ROBOT ASSISTED LAPAROSCOPIC NEPHRECTOMY Right 08/19/2013   Procedure: ROBOTIC ASSISTED LAPAROSCOPIC RIGHT PARTIAL NEPHRECTOMY;  Surgeon: Ardis Hughs, MD;  Location: [facility];  Service: Urology;  Laterality: Right;    There were no vitals filed for this visit.   Subjective Assessment - 03/16/21 1534     Subjective No new complatins. No falls  or pain to report.    Pertinent History Kidney CA, HIV, bronchitis, HTN, renal insufficiency, nephrectomy    Limitations Standing;Walking    Patient Stated Goals Walking better, walking without the cane    Currently in Pain? No/denies    Pain Score 0-No pain                [facility] PT Assessment - 03/16/21 1538       Standardized Balance Assessment   Standardized Balance Assessment Dynamic Gait Index;10 meter walk test;Five Times Sit to Stand    10 Meter Walk 7.66 sec's no AD with left flat foot noted=      Dynamic Gait Index   Level Surface Normal    Change in Gait Speed Normal    Gait with Horizontal Head Turns Normal    Gait with Vertical Head Turns Normal    Gait and Pivot Turn Normal    Step Over Obstacle Normal    Step Around Obstacles Normal    Steps Mild Impairment   did not use rail, however bumped into them with descent   Total Score 23      Functional Gait  Assessment   Gait assessed  Yes    Gait Level Surface Walks 20 ft in less than 7 sec but greater than 5.5 sec, uses assistive device, slower speed, mild gait deviations, or deviates 6-10 in outside of the 12 in walkway width.   6.50  Change in Gait Speed Able to smoothly change walking speed without loss of balance or gait deviation. Deviate no more than 6 in outside of the 12 in walkway width.    Gait with Horizontal Head Turns Performs head turns smoothly with no change in gait. Deviates no more than 6 in outside 12 in walkway width    Gait with Vertical Head Turns Performs head turns with no change in gait. Deviates no more than 6 in outside 12 in walkway width.    Gait and Pivot Turn Pivot turns safely within 3 sec and stops quickly with no loss of balance.    Step Over Obstacle Is able to step over one shoe box (4.5 in total height) without changing gait speed. No evidence of imbalance.    Gait with Narrow Base of Support Ambulates 7-9 steps.    Gait with Eyes Closed Walks 20 ft, slow speed, abnormal gait  pattern, evidence for imbalance, deviates 10-15 in outside 12 in walkway width. Requires more than 9 sec to ambulate 20 ft.   8.91 sec's   Ambulating Backwards Walks 20 ft, uses assistive device, slower speed, mild gait deviations, deviates 6-10 in outside 12 in walkway width.   14.60 sec's   Steps Alternating feet, must use rail.   did not use rails, bumped into rails at times with descent   Total Score 23    FGA comment: 23/30 scored today                     [facility] Adult PT Treatment/Exercise - 03/16/21 1538       Transfers   Transfers Sit to Stand;Stand to Sit    Sit to Stand 5: Supervision    Stand to Sit 5: Supervision      Ambulation/Gait   Ambulation/Gait Yes    Ambulation/Gait Assistance 6: Modified independent (Device/Increase time)    Ambulation/Gait Assistance Details pt noted to have increased left foot slap with gait as fatigues.    Assistive device None    Gait Pattern Step-through pattern    Ambulation Surface Level;Indoor    Gait velocity 7.66 sec's = 4.28 ft/sec no AD     Self-Care   Self-Care Other Self-Care Comments    Other Self-Care Comments  HEP. Reviewed current program. No issues and remains appropriate at this time. Will benefit from advancement as pt improves.     Neuro Re-ed    Neuro Re-ed Details  for balance/NMR/coordination: gait around track with golf ball in left UE/both stress balls in right UE while talking about work things to do, then self tossing green ball while naming girl names a-z, then boys names a-z; working on [employer] on track with 1 lap jogging, 1 lap walking for ~5 mintues. Pt self setting jogging and walkng pace. Min guard assist for safety. BP after 143/105. After water and rest for 5 minutes BP 132./100.                      PT Short Term Goals - 03/14/21 1349       PT SHORT TERM GOAL #1   Title Pt will demonstrate independence with initial HEP    Time 4    Period Weeks    Status Achieved     Target Date 02/11/21      PT SHORT TERM GOAL #2   Title Pt will improve five time sit to stand to </= 13.5 seconds with no anterior LOB upon  rising    Baseline 14 seconds with anterior LOB when standing; 11 sec on 02/06/21    Time 4    Period Weeks    Status Achieved    Target Date 02/11/21      PT SHORT TERM GOAL #3   Title Pt will improve gait velocity without cane to >/= 2.62 ft/sec    Baseline 2.05 ft/sec without cane; 2.2 ft/sec with no a/d on 02/06/21    Time 4    Period Weeks    Status Partially Met    Target Date 02/11/21      PT SHORT TERM GOAL #4   Title Pt will increase DGI score by 4 points to indicate decreased falls risk    Baseline 13/24; 20/24 on 02/06/21    Time 4    Period Weeks    Status Achieved    Target Date 02/11/21      PT SHORT TERM GOAL #5   Title Pt will negotiate 4 stairs with one rail (R to ascend) with alternating sequence and min A (no cane)    Baseline met on 7/1    Time 4    Period Weeks    Status Achieved    Target Date 02/11/21               PT Long Term Goals - 03/16/21 1537       PT LONG TERM GOAL #1   Title Pt will demonstrate independence with final HEP for jogging/running    Baseline 03/16/21: met with current program will benefit from advancement as pt progresses    Status Partially Met    Target Date 04/14/21      PT LONG TERM GOAL #2   Title Pt will increase gait velocity without cane to >/= 3.0 ft/sec with increased L arm swing    Baseline 03/16/21: 7.66 sec's= 4.28 ft/sec no AD    Status Achieved      PT LONG TERM GOAL #3   Title Pt will increase DGI score to 24/24    Baseline 03/16/21: 23/24- pt bumping into rails for balance assist    Period --    Status Partially Met    Target Date 04/14/21      PT LONG TERM GOAL #4   Title Pt will negotiate 4 stairs with 1 rail, alternating sequence MOD I (no cane) to improve safety with home entry/exit    Baseline 03/16/21: met in session    Time --    Period --    Status  Achieved      PT LONG TERM GOAL #5   Title Pt will demonstrate ability to perform functional bimanual task safely and independently without having to look at LUE when performing    Baseline 03/16/21: met with tasks performed in session today, however pt endorces he still looks at hand with tasks at times    Time --    Period --    Status Achieved      PT LONG TERM GOAL #6   Title Pt will be able to tolerate jogging for ~10 minutes    Baseline 03/16/21: up to 5 minutes with intervals of 1 lap jogging<>1 lap walking. short of breath with elevated BP aftewards. Both improved with rest break.    Time --    Period --    Status Partially Met    Target Date 04/14/21               03/16/21 1535  Plan  Clinical Impression Statement Today's skilled session focused on progress toward LTGs with goals partially to fully met. With pt scoring 23/24 on DGI, FGA was performed with score of 23/30. Primary PT to recert to continue to address high level balance and coordination, along with return to jogging.  Personal Factors and Comorbidities Comorbidity 3+;Profession  Comorbidities Kidney CA, HIV, bronchitis, HTN, renal insufficiency, nephrectomy  Examination-Activity Limitations Bend;Carry;Dressing;Lift;Locomotion Level;Reach Overhead;Stairs  Examination-Participation Restrictions Community Activity;Driving;Occupation  Pt will benefit from skilled therapeutic intervention in order to improve on the following deficits Abnormal gait;Decreased activity tolerance;Decreased balance;Decreased coordination;Difficulty walking;Impaired UE functional use;Impaired vision/preception  Rehab Potential Good  PT Frequency 2x / week  PT Duration 6 weeks (corrected by primary PT to match certification dates)  PT Treatment/Interventions ADLs/Self Care Home Management;Gait training;Stair training;Functional mobility training;Therapeutic activities;Therapeutic exercise;Balance training;Neuromuscular  re-education;Patient/family education;Orthotic Fit/Training;Vestibular;Visual/perceptual remediation/compensation;Aquatic Therapy;Cryotherapy;Moist Heat  PT Next Visit Plan 1 more aquatic session to fully set HEP for pt to complete on his own. Need to request auth from insurance when approaching 20th visit.   Continue to work on endurance, balance, coordination of L LE. Continue working on jogging/treadmill/plyometrics to improve L LE timing.  PT Home Exercise Plan Access Code: [email or web address]  Consulted and Agree with Plan of Care Patient   Corrected by PT: Rico Junker, PT, DPT 03/19/21    2:03 PM     Patient will benefit from skilled therapeutic intervention in order to improve the following deficits and impairments:  Abnormal gait, Decreased activity tolerance, Decreased balance, Decreased coordination, Difficulty walking, Impaired UE functional use, Impaired vision/preception  Visit Diagnosis: Unsteadiness on feet  Other abnormalities of gait and mobility  Other lack of coordination     Problem List Patient Active Problem List   Diagnosis Date Noted   Cerebellar stroke ([place]) 01/03/2021   COVID-19 virus infection 01/03/2021   Diverticulitis of large intestine with abscess 09/27/2020   Depression 06/19/2019   Other insomnia 06/19/2019   Chronic kidney disease, stage 3a ([place]) 04/15/2018   Tobacco dependence 01/13/2018   Primary osteoarthritis of right knee 04/03/2017   Dyslipidemia 06/14/2016   Renal cell carcinoma of right kidney ([place]) 08/19/2013   Renal cyst 05/21/2013   Obesity (BMI 30-39.9) 05/26/2012   Internal hemorrhoids 12/01/2008   Hypertension 04/28/2008   Major depressive disorder, single episode, severe ([place]) 12/20/2006   Human immunodeficiency virus (HIV) disease ([place]) 08/28/2006    Willow Ora, PTA, [facility] Outpatient Neuro [facility] [address] [place], [place] [address] [phone] 03/17/21, 4:52 PM   Name: Drew Cuevas MRN: [id number] Date  of Birth: 03-29-1971

## 2021-03-20 ENCOUNTER — Ambulatory Visit: Payer: 59

## 2021-03-22 ENCOUNTER — Ambulatory Visit: Payer: Self-pay | Admitting: Physical Therapy

## 2021-03-22 ENCOUNTER — Ambulatory Visit: Payer: 59 | Admitting: Physical Therapy

## 2021-03-22 ENCOUNTER — Encounter: Payer: 59 | Admitting: Occupational Therapy

## 2021-03-24 ENCOUNTER — Encounter: Payer: 59 | Admitting: Occupational Therapy

## 2021-03-27 ENCOUNTER — Ambulatory Visit: Payer: 59

## 2021-03-29 ENCOUNTER — Ambulatory Visit: Payer: Self-pay | Admitting: Physical Therapy

## 2021-03-31 ENCOUNTER — Ambulatory Visit: Payer: 59 | Attending: Internal Medicine

## 2021-03-31 DIAGNOSIS — R2689 Other abnormalities of gait and mobility: Secondary | ICD-10-CM | POA: Insufficient documentation

## 2021-03-31 DIAGNOSIS — M6281 Muscle weakness (generalized): Secondary | ICD-10-CM | POA: Insufficient documentation

## 2021-03-31 DIAGNOSIS — R2681 Unsteadiness on feet: Secondary | ICD-10-CM | POA: Insufficient documentation

## 2021-04-04 ENCOUNTER — Ambulatory Visit: Payer: 59

## 2021-04-06 ENCOUNTER — Telehealth: Payer: Self-pay

## 2021-04-06 ENCOUNTER — Ambulatory Visit: Payer: 59

## 2021-04-06 NOTE — Telephone Encounter (Signed)
Attempted to call patient regarding missed visit, unable to connect call

## 2021-04-10 ENCOUNTER — Ambulatory Visit: Payer: 59

## 2021-04-12 ENCOUNTER — Encounter: Payer: Self-pay | Admitting: Physical Therapy

## 2021-04-12 ENCOUNTER — Ambulatory Visit: Payer: 59 | Admitting: Physical Therapy

## 2021-04-12 ENCOUNTER — Other Ambulatory Visit: Payer: Self-pay

## 2021-04-12 DIAGNOSIS — R2681 Unsteadiness on feet: Secondary | ICD-10-CM

## 2021-04-12 DIAGNOSIS — M6281 Muscle weakness (generalized): Secondary | ICD-10-CM | POA: Diagnosis present

## 2021-04-12 DIAGNOSIS — R2689 Other abnormalities of gait and mobility: Secondary | ICD-10-CM

## 2021-04-13 NOTE — Therapy (Addendum)
[facility] [address] [place], [place], [address] Phone: [phone]   Fax:  [phone]  Physical Therapy Treatment and D/C Summary  Patient Details  Name: Drew Cuevas MRN: [id number] Date of Birth: August 25, 1970 Referring Provider (PT): Referred by hospitalist; will send to PCP: Ladell Pier, MD   Encounter Date: 04/12/2021   PT End of Session - 04/12/21 1449     Visit Number 18    Number of Visits 25    Date for PT Re-Evaluation 80/88/11   recert completed 0/3/15 by AN   Authorization Type [employer]; 30 VL - authorization required after 20th visit    Authorization - Visit Number 47   OT visit included in total count   Authorization - Number of Visits 20    Progress Note Due on Visit 20    PT Start Time 1448    PT Stop Time 1530    PT Time Calculation (min) 42 min    Equipment Utilized During Treatment Gait belt    Activity Tolerance Patient tolerated treatment well    Behavior During Therapy [facility] for tasks assessed/performed             Past Medical History:  Diagnosis Date   Bronchitis    LAST FLARE UP WAS NEW YEARS 2015   Cancer ([place])    kidney   GERD (gastroesophageal reflux disease)    NO MEDS   HIV (human immunodeficiency virus infection) ([place])    UNDER CONTROL WITH MEDICATIONS   Hypertension    Immune deficiency disorder ([place])    Renal insufficiency 05/17/2015   Renal mass, right     Past Surgical History:  Procedure Laterality Date   NO PAST SURGERIES     ROBOT ASSISTED LAPAROSCOPIC NEPHRECTOMY Right 08/19/2013   Procedure: ROBOTIC ASSISTED LAPAROSCOPIC RIGHT PARTIAL NEPHRECTOMY;  Surgeon: Ardis Hughs, MD;  [facility];  Service: Urology;  Laterality: Right;    There were no vitals filed for this visit.   Subjective Assessment - 04/12/21 1447     Subjective Has been going back and forth out of town due to death in the family. Has been back in the gym working out  with no issues. Main issue now is left knee locking out randomly, notices it "off and on" during the day. Back at work full time with no issues, hand tremors are getting better.    Pertinent History Kidney CA, HIV, bronchitis, HTN, renal insufficiency, nephrectomy    Limitations Standing;Walking    Patient Stated Goals Walking better, walking without the cane    Currently in Pain? No/denies    Pain Score 0-No pain                    [facility] Adult PT Treatment/Exercise - 04/12/21 1452       Transfers   Transfers Sit to Stand;Stand to Sit    Sit to Stand 6: Modified independent (Device/Increase time)    Stand to Sit 6: Modified independent (Device/Increase time)      Ambulation/Gait   Ambulation/Gait Yes    Ambulation/Gait Assistance 6: Modified independent (Device/Increase time)    Ambulation/Gait Assistance Details no balance or other issues noted with gait. trialed use of small heel wedge with pt reporting no signifiant change with knee control.    Ambulation Distance (Feet) 2000 Feet    Assistive device None    Gait Pattern Step-through pattern;Antalgic    Ambulation Surface Level;Indoor;Unlevel;Outdoor;Paved    Gait  velocity - backwards jogging<>walking intervals around track with 1 minute jogging<>1 minute walking for 3 minutes each, total of 6 minutes. no balance issues noted.      Self-Care   Self-Care Other Self-Care Comments    Other Self-Care Comments  post rehab fitness: pt has returned to gym working out several times a week. He has also started jogging short distance in his neighborhood.                       PT Short Term Goals - 03/14/21 1349       PT SHORT TERM GOAL #1   Title Pt will demonstrate independence with initial HEP    Time 4    Period Weeks    Status Achieved    Target Date 02/11/21      PT SHORT TERM GOAL #2   Title Pt will improve five time sit to stand to </= 13.5 seconds with no anterior LOB upon rising    Baseline 14  seconds with anterior LOB when standing; 11 sec on 02/06/21    Time 4    Period Weeks    Status Achieved    Target Date 02/11/21      PT SHORT TERM GOAL #3   Title Pt will improve gait velocity without cane to >/= 2.62 ft/sec    Baseline 2.05 ft/sec without cane; 2.2 ft/sec with no a/d on 02/06/21    Time 4    Period Weeks    Status Partially Met    Target Date 02/11/21      PT SHORT TERM GOAL #4   Title Pt will increase DGI score by 4 points to indicate decreased falls risk    Baseline 13/24; 20/24 on 02/06/21    Time 4    Period Weeks    Status Achieved    Target Date 02/11/21      PT SHORT TERM GOAL #5   Title Pt will negotiate 4 stairs with one rail (R to ascend) with alternating sequence and min A (no cane)    Baseline met on 7/1    Time 4    Period Weeks    Status Achieved    Target Date 02/11/21               PT Long Term Goals - 04/12/21 1450       PT LONG TERM GOAL #1   Title Pt will demonstrate independence with final HEP for jogging/running    Baseline 04/12/21: met today- pt going to gym and working on short distance jogging at home    Status Achieved      PT [place] #2   Title Pt will increase gait velocity without cane to >/= 3.0 ft/sec with increased L arm swing    Baseline 03/16/21: 7.66 sec's= 4.28 ft/sec no AD    Status Achieved      PT LONG TERM GOAL #3   Title Pt will increase DGI score to 24/24    Baseline 03/16/21: 23/24- pt bumping into rails for balance assist    Status Partially Met      PT LONG TERM GOAL #4   Title Pt will negotiate 4 stairs with 1 rail, alternating sequence MOD I (no cane) to improve safety with home entry/exit    Baseline 03/16/21: met in session    Status Achieved      PT LONG TERM GOAL #5   Title Pt will demonstrate ability to perform  functional bimanual task safely and independently without having to look at LUE when performing    Baseline 03/16/21: met with tasks performed in session today, however pt  endorces he still looks at hand with tasks at times    Status Achieved      PT LONG TERM GOAL #6   Title Pt will be able to tolerate jogging for ~10 minutes    Baseline 04/12/21: pt able to perform 6 minute of intervals this session with 1 minute jogging<>1 minute walking with no issues noted.    Status Partially Met                   Plan - 04/12/21 1450     Clinical Impression Statement Today's skilled session focused on progress toward LTGs. Pt has met most all goals. Unable to meet the goal for jogging for 10 minutes. The pt has returned to light jogging, just not for this time frame. The pt has also returned to work full time and as started back at the [employer] whe worked out at before. The pt is agreeable to discharge at this time.    Personal Factors and Comorbidities Comorbidity 3+;Profession    Comorbidities Kidney CA, HIV, bronchitis, HTN, renal insufficiency, nephrectomy    Examination-Activity Limitations [place];Carry;Dressing;Lift;Locomotion Level;Reach Overhead;Stairs    Examination-Participation Restrictions Community Activity;Driving;Occupation    Rehab Potential Good    PT Frequency 2x / week    PT Duration 6 weeks   corrected by primary PT to match certification dates   PT Treatment/Interventions ADLs/Self Care Home Management;Gait training;Stair training;Functional mobility training;Therapeutic activities;Therapeutic exercise;Balance training;Neuromuscular re-education;Patient/family education;Orthotic Fit/Training;Vestibular;Visual/perceptual remediation/compensation;Aquatic Therapy;Cryotherapy;Moist Heat    PT Next Visit Plan discharge today    PT Home Exercise Plan Access Code: [email or web address]    Consulted and Agree with Plan of Care Patient             Patient will benefit from skilled therapeutic intervention in order to improve the following deficits and impairments:  Abnormal gait, Decreased activity tolerance, Decreased balance, Decreased coordination,  Difficulty walking, Impaired UE functional use, Impaired vision/preception  Visit Diagnosis: Unsteadiness on feet  Other abnormalities of gait and mobility  Muscle weakness (generalized)     Problem List Patient Active Problem List   Diagnosis Date Noted   Cerebellar stroke ([place]) 01/03/2021   COVID-19 virus infection 01/03/2021   Diverticulitis of large intestine with abscess 09/27/2020   Depression 06/19/2019   Other insomnia 06/19/2019   Chronic kidney disease, stage 3a ([place]) 04/15/2018   Tobacco dependence 01/13/2018   Primary osteoarthritis of right knee 04/03/2017   Dyslipidemia 06/14/2016   Renal cell carcinoma of right kidney ([place]) 08/19/2013   Renal cyst 05/21/2013   Obesity (BMI 30-39.9) 05/26/2012   Internal hemorrhoids 12/01/2008   Hypertension 04/28/2008   Major depressive disorder, single episode, severe ([place]) 12/20/2006   Human immunodeficiency virus (HIV) disease ([place]) 08/28/2006    Willow Ora, PTA, [facility] Outpatient Neuro [facility] [address] [place], [place] [address] [phone] 04/13/21, 9:40 PM   PHYSICAL THERAPY DISCHARGE SUMMARY  Visits from Start of Care: 18  Current functional level related to goals / functional outcomes: See LTG achievement and impression statement above; pt has made excellent progress.   Remaining deficits: Mild weakness   Education / Equipment: HEP   Patient agrees to discharge. Patient goals were met. Patient is being discharged due to meeting the stated rehab goals.  Rico Junker, PT, DPT 04/18/21    8:42 AM  Name: Drew Cuevas MRN: [id number] Date of Birth: [birth year]

## 2021-04-17 ENCOUNTER — Ambulatory Visit: Payer: 59

## 2021-04-19 ENCOUNTER — Ambulatory Visit: Payer: 59 | Admitting: Neurology

## 2021-04-19 ENCOUNTER — Other Ambulatory Visit: Payer: Self-pay

## 2021-04-19 ENCOUNTER — Encounter: Payer: Self-pay | Admitting: Neurology

## 2021-04-19 VITALS — BP 127/90 | HR 96 | Ht 66.0 in | Wt 236.0 lb

## 2021-04-19 DIAGNOSIS — I639 Cerebral infarction, unspecified: Secondary | ICD-10-CM | POA: Diagnosis not present

## 2021-04-19 DIAGNOSIS — U071 COVID-19: Secondary | ICD-10-CM

## 2021-04-19 NOTE — Progress Notes (Signed)
[facility] [address] [place]. [place] [address] [phone]       OFFICE FOLLOW-UP NOTE  Mr. Drew Cuevas Date of Birth:  [birth year] Medical Record Number:  [id number]   HPI: [name] is a 50 year old African-American male seen today for initial office follow-up visit following hospital admission for stroke in June 2022.  History is obtained from the patient, review of electronic medical records and I personally reviewed available imaging films in PACS.  He has past medical history of hypertension, gastroesophageal reflux disease, HIV and renal mass.  He presented on 01/04/2021 with 3 to 4-day history of vertigo, nausea, vomiting and difficulty walking.  Also noticed left hand and leg incoordination.  MRI scan of the brain showed acute left superior cerebellar artery as well as acute right-sided brainstem and small subacute right frontal cortical infarct.  MR angiogram of the brain and neck showed no significant large vessel extracranial or intracranial stenosis.  LDL cholesterol is 134 mg percent.  Hemoglobin A1c was 5.9.  Urine drug screen was positive for marijuana.  Echocardiogram showed normal ejection fraction without cardiac source of embolism.  Patient also tested positive for COVID and strokes were felt related to COVID hypercoagulability.  Patient was discharged home on aspirin Plavix and is now on Plavix alone which is tolerating well without bruising or bleeding.  He is finished outpatient physical and occupational therapy.  He still has mild left hand incoordination and slight difficulty with balance and walking intermittently.  He is cut back smoking cigarettes but has not quit completely.  His blood pressure under good control today it is 127/90.  He is tolerating Lipitor well without any side effects.  He has had no recurrent TIA or stroke symptoms. ROS:   14 system review of systems is positive for  Incoordination, gait difficulty, imbalance all other systems  negative PMH:  Past Medical History:  Diagnosis Date   Bronchitis    LAST FLARE UP WAS NEW YEARS 2015   Cancer ([place])    kidney   GERD (gastroesophageal reflux disease)    NO MEDS   HIV (human immunodeficiency virus infection) ([place])    UNDER CONTROL WITH MEDICATIONS   Hypertension    Immune deficiency disorder ([place])    Renal insufficiency 05/17/2015   Renal mass, right     Social History:  Social History   Socioeconomic History   Marital status: Single    Spouse name: Not on file   Number of children: Not on file   Years of education: Not on file   Highest education level: Not on file  Occupational History   Not on file  Tobacco Use   Smoking status: Every Day    Packs/day: 0.50    Years: 30.00    Pack years: 15.00    Types: Cigarettes    Last attempt to quit: 11/28/2015    Years since quitting: 5.3   Smokeless tobacco: Never   Tobacco comments:    stopped May 2017  Vaping Use   Vaping Use: Never used  Substance and Sexual Activity   Alcohol use: No    Comment: stopped 10-12 years ago   Drug use: No   Sexual activity: Not Currently    Partners: Male    Comment: declined condoms - 6.22.22  Other Topics Concern   Not on file  Social History Narrative   Not on file   Social Determinants of Health   Financial Resource Strain: Not on file  Food Insecurity: Not  on file  Transportation Needs: Not on file  Physical Activity: Not on file  Stress: Not on file  Social Connections: Not on file  Intimate Partner Violence: Not on file    Medications:   Current Outpatient Medications on File Prior to Visit  Medication Sig Dispense Refill   abacavir-dolutegravir-lamiVUDine (TRIUMEQ) 600-50-300 MG tablet Take 1 tablet by mouth daily. 30 tablet 11   acetaminophen (TYLENOL) 325 MG tablet Take 2 tablets (650 mg total) by mouth every 6 (six) hours as needed for mild pain (or Fever >/= 101).     albuterol (VENTOLIN HFA) 108 (90 Base) MCG/ACT inhaler Inhale 1-2 puffs into  the lungs every 6 (six) hours as needed for wheezing or shortness of breath.     amLODipine (NORVASC) 10 MG tablet Take 1 tablet (10 mg total) by mouth daily. 30 tablet 6   atorvastatin (LIPITOR) 80 MG tablet Take 1 tablet (80 mg total) by mouth daily. 30 tablet 6   carvedilol (COREG) 3.125 MG tablet Take 1 tablet (3.125 mg total) by mouth 2 (two) times daily with a meal. 60 tablet 3   clopidogrel (PLAVIX) 75 MG tablet Take 1 tablet (75 mg total) by mouth daily. 30 tablet 3   escitalopram (LEXAPRO) 10 MG tablet TAKE 1 TABLET(10 MG) BY MOUTH DAILY 30 tablet 0   Misc. Devices MISC 1 home blood pressure monitor 1 Device 0   nicotine (NICODERM CQ - DOSED IN MG/24 HOURS) 14 mg/24hr patch Place 1 patch (14 mg total) onto the skin daily. 28 patch 1   traZODone (DESYREL) 50 MG tablet TAKE 1/2 TABLET(25 MG) BY MOUTH AT BEDTIME 15 tablet 0   No current facility-administered medications on file prior to visit.    Allergies:   Allergies  Allergen Reactions   Ibuprofen Anaphylaxis, Swelling and Other (See Comments)    Dehydration Swelling of the "moist areas" (throat, mouth)   Penicillins Hives       Latex Rash    Physical Exam General: Obese middle-aged African-American male, seated, in no evident distress Head: head normocephalic and atraumatic.  Neck: supple with no carotid or supraclavicular bruits Cardiovascular: regular rate and rhythm, no murmurs Musculoskeletal: no deformity Skin:  no rash/petichiae Vascular:  Normal pulses all extremities Vitals:   04/19/21 1105  BP: 127/90  Pulse: 96   Neurologic Exam Mental Status: Awake and fully alert. Oriented to place and time. Recent and remote memory intact. Attention span, concentration and fund of knowledge appropriate. Mood and affect appropriate.  Cranial Nerves: Fundoscopic exam reveals sharp disc margins. Pupils equal, briskly reactive to light. Extraocular movements full without nystagmus. Visual fields full to confrontation. Hearing  intact. Facial sensation intact. Face, tongue, palate moves normally and symmetrically.  Motor: Normal bulk and tone. Normal strength in all tested extremity muscles. Sensory.: intact to touch ,pinprick .position and vibratory sensation.  Coordination: Mild left finger-to-nose dysmetria.  Coordination slow but accurate in other limbs. Gait and Station: Arises from chair without difficulty. Stance is normal. Gait demonstrates normal stride length and balance . Able to heel, toe and tandem walk with moderate difficulty.  Reflexes: 1+ and symmetric. Toes downgoing.   NIHSS 1 Modified Rankin 2   ASSESSMENT: 50 year old African-American male with bilateral cerebellar, brainstem and right frontal infarcts in June 2022 in the setting of acute COVID illness.  Possibly COVID-related hypercoagulability versus cryptogenic etiology.  He is doing well with only mild residual left-sided ataxia.  Vascular risk factors of smoking cigarettes and marijuana, hyperlipidemia and obesity  PLAN: I had a long d/w patient about his recent stroke in the setting of COVID infection, risk for recurrent stroke/TIAs, personally independently reviewed imaging studies and stroke evaluation results and answered questions.Continue Plavix 75 mg daily for secondary stroke prevention and maintain strict control of hypertension with blood pressure goal below 130/90, diabetes with hemoglobin A1c goal below 6.5% and lipids with LDL cholesterol goal below 70 mg/dL. I also advised the patient to eat a healthy diet with plenty of whole grains, cereals, fruits and vegetables, exercise regularly and maintain ideal body weight .patient was counseled to quit smoking cigarettes and marijuana completely and is agreeable.  Recommend check 30-day heart monitor for paroxysmal A. fib.  Followup in the future with my nurse practitioner Janett Billow in 3 months or call earlier if necessary. Greater than 50% of time during this 35 minute visit was spent on  counseling,explanation of diagnosis of cerebellar stroke and COVID infection, planning of further management, discussion with patient and family and coordination of care Antony Contras, MD Note: This document was prepared with digital dictation and possible smart phrase technology. Any transcriptional errors that result from this process are unintentional

## 2021-04-19 NOTE — Patient Instructions (Signed)
I had a long d/w patient about his recent stroke in the setting of COVID infection, risk for recurrent stroke/TIAs, personally independently reviewed imaging studies and stroke evaluation results and answered questions.Continue Plavix 75 mg daily for secondary stroke prevention and maintain strict control of hypertension with blood pressure goal below 130/90, diabetes with hemoglobin A1c goal below 6.5% and lipids with LDL cholesterol goal below 70 mg/dL. I also advised the patient to eat a healthy diet with plenty of whole grains, cereals, fruits and vegetables, exercise regularly and maintain ideal body weight .patient was counseled to quit smoking cigarettes and marijuana completely and is agreeable.  Recommend check 30-day heart monitor for paroxysmal A. fib.  Followup in the future with my nurse practitioner Janett Billow in 3 months or call earlier if necessary. Stroke Prevention Some medical conditions and behaviors are associated with a higher chance of having a stroke. You can help prevent a stroke by making nutrition, lifestyle, and other changes, including managing any medical conditions you may have. What nutrition changes can be made?  Eat healthy foods. You can do this by: Choosing foods high in fiber, such as fresh fruits and vegetables and whole grains. Eating at least 5 or more servings of fruits and vegetables a day. Try to fill half of your plate at each meal with fruits and vegetables. Choosing lean protein foods, such as lean cuts of meat, poultry without skin, fish, tofu, beans, and nuts. Eating low-fat dairy products. Avoiding foods that are high in salt (sodium). This can help lower blood pressure. Avoiding foods that have saturated fat, trans fat, and cholesterol. This can help prevent high cholesterol. Avoiding processed and premade foods. Follow your health care provider's specific guidelines for losing weight, controlling high blood pressure (hypertension), lowering high  cholesterol, and managing diabetes. These may include: Reducing your daily calorie intake. Limiting your daily sodium intake to 1,500 milligrams (mg). Using only healthy fats for cooking, such as olive oil, canola oil, or sunflower oil. Counting your daily carbohydrate intake. What lifestyle changes can be made? Maintain a healthy weight. Talk to your health care provider about your ideal weight. Get at least 30 minutes of moderate physical activity at least 5 days a week. Moderate activity includes brisk walking, biking, and swimming. Do not use any products that contain nicotine or tobacco, such as cigarettes and e-cigarettes. If you need help quitting, ask your health care provider. It may also be helpful to avoid exposure to secondhand smoke. Limit alcohol intake to no more than 1 drink a day for nonpregnant women and 2 drinks a day for men. One drink equals 12 oz of beer, 5 oz of wine, or 1 oz of hard liquor. Stop any illegal drug use. Avoid taking birth control pills. Talk to your health care provider about the risks of taking birth control pills if: You are over 60 years old. You smoke. You get migraines. You have ever had a blood clot. What other changes can be made? Manage your cholesterol levels. Eating a healthy diet is important for preventing high cholesterol. If cholesterol cannot be managed through diet alone, you may also need to take medicines. Take any prescribed medicines to control your cholesterol as told by your health care provider. Manage your diabetes. Eating a healthy diet and exercising regularly are important parts of managing your blood sugar. If your blood sugar cannot be managed through diet and exercise, you may need to take medicines. Take any prescribed medicines to control your diabetes as told  by your health care provider. Control your hypertension. To reduce your risk of stroke, try to keep your blood pressure below 130/80. Eating a healthy diet and  exercising regularly are an important part of controlling your blood pressure. If your blood pressure cannot be managed through diet and exercise, you may need to take medicines. Take any prescribed medicines to control hypertension as told by your health care provider. Ask your health care provider if you should monitor your blood pressure at home. Have your blood pressure checked every year, even if your blood pressure is normal. Blood pressure increases with age and some medical conditions. Get evaluated for sleep disorders (sleep apnea). Talk to your health care provider about getting a sleep evaluation if you snore a lot or have excessive sleepiness. Take over-the-counter and prescription medicines only as told by your health care provider. Aspirin or blood thinners (antiplatelets or anticoagulants) may be recommended to reduce your risk of forming blood clots that can lead to stroke. Make sure that any other medical conditions you have, such as atrial fibrillation or atherosclerosis, are managed. What are the warning signs of a stroke? The warning signs of a stroke can be easily remembered as BEFAST. B is for balance. Signs include: Dizziness. Loss of balance or coordination. Sudden trouble walking. E is for eyes. Signs include: A sudden change in vision. Trouble seeing. F is for face. Signs include: Sudden weakness or numbness of the face. The face or eyelid drooping to one side. A is for arms. Signs include: Sudden weakness or numbness of the arm, usually on one side of the body. S is for speech. Signs include: Trouble speaking (aphasia). Trouble understanding. T is for time. These symptoms may represent a serious problem that is an emergency. Do not wait to see if the symptoms will go away. Get medical help right away. Call your local emergency services (911 in the [place] Do not drive yourself to the hospital. Other signs of stroke may include: A sudden, severe headache with no  known cause. Nausea or vomiting. Seizure. Where to find more information For more information, visit: American Stroke Association: www.strokeassociation.org National Stroke Association: www.stroke.org Summary You can prevent a stroke by eating healthy, exercising, not smoking, limiting alcohol intake, and managing any medical conditions you may have. Do not use any products that contain nicotine or tobacco, such as cigarettes and e-cigarettes. If you need help quitting, ask your health care provider. It may also be helpful to avoid exposure to secondhand smoke. Remember BEFAST for warning signs of stroke. Get help right away if you or a loved one has any of these signs. This information is not intended to replace advice given to you by your health care provider. Make sure you discuss any questions you have with your health care provider. Document Revised: 06/28/2017 Document Reviewed: 08/21/2016 Elsevier Patient Education  2021 [employer].

## 2021-04-25 ENCOUNTER — Other Ambulatory Visit: Payer: Self-pay | Admitting: Internal Medicine

## 2021-04-25 DIAGNOSIS — I10 Essential (primary) hypertension: Secondary | ICD-10-CM

## 2021-04-25 DIAGNOSIS — Z8673 Personal history of transient ischemic attack (TIA), and cerebral infarction without residual deficits: Secondary | ICD-10-CM

## 2021-04-26 NOTE — Telephone Encounter (Signed)
Requested medications are due for refill today.  yes  Requested medications are on the active medications list.  yes  Last refill. 01/26/2021  Future visit scheduled.   yes  Notes to clinic.  Pt needs follow-up labs.

## 2021-04-27 ENCOUNTER — Other Ambulatory Visit: Payer: Self-pay | Admitting: Internal Medicine

## 2021-04-27 DIAGNOSIS — Z8673 Personal history of transient ischemic attack (TIA), and cerebral infarction without residual deficits: Secondary | ICD-10-CM

## 2021-04-28 NOTE — Telephone Encounter (Signed)
Requested medication (s) are due for refill today:   Prescribed yesterday 04/27/2021  Requested medication (s) are on the active medication list:   Yes  Future visit scheduled:   Yes in 1 wk with Wynetta Emery   Last ordered: 04/27/2021 #30, 0 refills  Returned because pt requesting a 90 day supply  There is a note he needs OV first.   Provider to review for 90 day supply prior to next OV.   Requested Prescriptions  Pending Prescriptions Disp Refills   clopidogrel (PLAVIX) 75 MG tablet [Pharmacy Med Name: CLOPIDOGREL 75MG  TABLETS] 90 tablet     Sig: TAKE 1 TABLET(75 MG) BY MOUTH DAILY     Hematology: Antiplatelets - clopidogrel Failed - 04/27/2021  3:35 PM      Failed - Evaluate AST, ALT within 2 months of therapy initiation.      Passed - ALT in normal range and within 360 days    ALT  Date Value Ref Range Status  01/05/2021 30 0 - 44 U/L Final          Passed - AST in normal range and within 360 days    AST  Date Value Ref Range Status  01/05/2021 29 15 - 41 U/L Final          Passed - HCT in normal range and within 180 days    Hematocrit  Date Value Ref Range Status  01/26/2021 46.0 37.5 - 51.0 % Final          Passed - HGB in normal range and within 180 days    Hemoglobin  Date Value Ref Range Status  01/26/2021 15.5 13.0 - 17.7 g/dL Final          Passed - PLT in normal range and within 180 days    Platelets  Date Value Ref Range Status  01/26/2021 256 150 - 450 x10E3/uL Final          Passed - Valid encounter within last 6 months    Recent Outpatient Visits           2 months ago Essential hypertension   [place], Jarome Matin, RPH-CPP   3 months ago Hospital discharge follow-up   [place], MD   3 months ago Essential hypertension   [place], Deborah B, MD   6 months ago Pilonidal disease   [place] Judeth Horn, MD   7 months ago Essential hypertension   [place], MD       Future Appointments             In 1 week Ladell Pier, MD [place]

## 2021-05-05 ENCOUNTER — Encounter: Payer: Self-pay | Admitting: Internal Medicine

## 2021-05-05 ENCOUNTER — Ambulatory Visit: Payer: 59 | Attending: Internal Medicine | Admitting: Internal Medicine

## 2021-05-05 ENCOUNTER — Other Ambulatory Visit: Payer: Self-pay

## 2021-05-05 VITALS — BP 138/85 | HR 88 | Resp 16 | Wt 236.4 lb

## 2021-05-05 DIAGNOSIS — I10 Essential (primary) hypertension: Secondary | ICD-10-CM | POA: Diagnosis not present

## 2021-05-05 DIAGNOSIS — Z23 Encounter for immunization: Secondary | ICD-10-CM | POA: Diagnosis not present

## 2021-05-05 DIAGNOSIS — Z6838 Body mass index (BMI) 38.0-38.9, adult: Secondary | ICD-10-CM

## 2021-05-05 DIAGNOSIS — Z1211 Encounter for screening for malignant neoplasm of colon: Secondary | ICD-10-CM

## 2021-05-05 DIAGNOSIS — Z87891 Personal history of nicotine dependence: Secondary | ICD-10-CM

## 2021-05-05 MED ORDER — CARVEDILOL 6.25 MG PO TABS: 6.2500 mg | ORAL_TABLET | Freq: Two times a day (BID) | ORAL | 2 refills | Status: DC

## 2021-05-05 NOTE — Patient Instructions (Signed)

## 2021-05-05 NOTE — Progress Notes (Signed)
Patient ID: Drew Cuevas, male    DOB: 20-May-1971  MRN: [id number]  CC: Chronic disease management  Subjective: Drew Cuevas is a 50 y.o. male who presents for chronic disease management His concerns today include:  HTN, HL, HIV,OA RT knee, rectal fistulectomy, Renal cell CA s/p partial nephrology, depression, CKD stage2-3, .past hx of DM cured  Since last visit with me, patient has seen the neurologist Dr. Leonie Man.  30-day heart monitor was ordered to check for PAF.  Patient reports that the strength in his right hand is improving it is about 85 to 90% better.    HTN: Compliant with meds and limits salt.  He took medications already for today. No device to check BP No chest pains or shortness of breath.  No swelling in the legs. Tob dependence: Quit smoking 3 wks ago.    Obesity: started going to gym 2-3x/wk.  He also has a treadmill at home that he uses.  On average he tries to exercise 3-4 times a week.  Admits that he needs to do better with his eating habits.  He is trying to cut back on eating pork and beef.  He snacks on junk foods including chips and candies at nights.  He also eat late at nights.  HM: Due for flu shot.  Agreeable to receiving that today. Patient Active Problem List   Diagnosis Date Noted   Cerebellar stroke ([place]) 01/03/2021   COVID-19 virus infection 01/03/2021   Diverticulitis of large intestine with abscess 09/27/2020   Depression 06/19/2019   Other insomnia 06/19/2019   Chronic kidney disease, stage 3a ([place]) 04/15/2018   Tobacco dependence 01/13/2018   Primary osteoarthritis of right knee 04/03/2017   Dyslipidemia 06/14/2016   Renal cell carcinoma of right kidney ([place]) 08/19/2013   Renal cyst 05/21/2013   Obesity (BMI 30-39.9) 05/26/2012   Internal hemorrhoids 12/01/2008   Hypertension 04/28/2008   Major depressive disorder, single episode, severe ([place]) 12/20/2006   Human immunodeficiency virus (HIV) disease ([place]) 08/28/2006     Current  Outpatient Medications on File Prior to Visit  Medication Sig Dispense Refill   clopidogrel (PLAVIX) 75 MG tablet Take 1 tablet (75 mg total) by mouth daily. 30 tablet 0   abacavir-dolutegravir-lamiVUDine (TRIUMEQ) 211-94-174 MG tablet Take 1 tablet by mouth daily. 30 tablet 11   acetaminophen (TYLENOL) 325 MG tablet Take 2 tablets (650 mg total) by mouth every 6 (six) hours as needed for mild pain (or Fever >/= 101). (Patient not taking: Reported on 05/05/2021)     albuterol (VENTOLIN HFA) 108 (90 Base) MCG/ACT inhaler Inhale 1-2 puffs into the lungs every 6 (six) hours as needed for wheezing or shortness of breath. (Patient not taking: Reported on 05/05/2021)     amLODipine (NORVASC) 10 MG tablet Take 1 tablet (10 mg total) by mouth daily. 30 tablet 6   atorvastatin (LIPITOR) 80 MG tablet Take 1 tablet (80 mg total) by mouth daily. 30 tablet 6   carvedilol (COREG) 3.125 MG tablet TAKE 1 TABLET(3.125 MG) BY MOUTH TWICE DAILY WITH A MEAL 180 tablet 0   escitalopram (LEXAPRO) 10 MG tablet TAKE 1 TABLET(10 MG) BY MOUTH DAILY (Patient not taking: Reported on 05/05/2021) 30 tablet 0   Misc. Devices MISC 1 home blood pressure monitor 1 Device 0   nicotine (NICODERM CQ - DOSED IN MG/24 HOURS) 14 mg/24hr patch Place 1 patch (14 mg total) onto the skin daily. (Patient not taking: Reported on 05/05/2021) 28 patch 1   traZODone (  DESYREL) 50 MG tablet TAKE 1/2 TABLET(25 MG) BY MOUTH AT BEDTIME 15 tablet 0   No current facility-administered medications on file prior to visit.    Allergies  Allergen Reactions   Ibuprofen Anaphylaxis, Swelling and Other (See Comments)    Dehydration Swelling of the "moist areas" (throat, mouth)   Penicillins Hives    Has patient had a PCN reaction causing immediate rash, facial/tongue/throat swelling, SOB or lightheadedness with hypotension: yes Has patient had a PCN reaction causing severe rash involving mucus membranes or skin necrosis: no-- pt had hives Has patient had a  PCN reaction that required hospitalization no Has patient had a PCN reaction occurring within the last 10 years: no If all of the above answers are "NO", then may proceed with Cephalosporin use.    Latex Rash    Social History   Socioeconomic History   Marital status: Single    Spouse name: Not on file   Number of children: Not on file   Years of education: Not on file   Highest education level: Not on file  Occupational History   Not on file  Tobacco Use   Smoking status: Every Day    Packs/day: 0.50    Years: 30.00    Pack years: 15.00    Types: Cigarettes    Last attempt to quit: 11/28/2015    Years since quitting: 5.4   Smokeless tobacco: Never   Tobacco comments:    stopped May 2017  Vaping Use   Vaping Use: Never used  Substance and Sexual Activity   Alcohol use: No    Comment: stopped 10-12 years ago   Drug use: No   Sexual activity: Not Currently    Partners: Male    Comment: declined condoms - 6.22.22  Other Topics Concern   Not on file  Social History Narrative   Not on file   Social Determinants of Health   Financial Resource Strain: Not on file  Food Insecurity: Not on file  Transportation Needs: Not on file  Physical Activity: Not on file  Stress: Not on file  Social Connections: Not on file  Intimate Partner Violence: Not on file    Family History  Problem Relation Age of Onset   Hypertension Mother    Hypertension Sister    Hypertension Brother     Past Surgical History:  Procedure Laterality Date   NO PAST SURGERIES     ROBOT ASSISTED LAPAROSCOPIC NEPHRECTOMY Right 08/19/2013   Procedure: ROBOTIC ASSISTED LAPAROSCOPIC RIGHT PARTIAL NEPHRECTOMY;  Surgeon: Ardis Hughs, MD;  Location: [facility];  Service: Urology;  Laterality: Right;    ROS: Review of Systems Negative except as stated above  PHYSICAL EXAM: BP 138/85   Pulse 88   Resp 16   Wt 236 lb 6.4 oz (107.2 kg)   SpO2 96%   BMI 38.16 kg/m   Wt Readings from Last 3  Encounters:  05/05/21 236 lb 6.4 oz (107.2 kg)  04/19/21 236 lb (107 kg)  01/26/21 234 lb 6.4 oz (106.3 kg)    Physical Exam   General appearance - alert, well appearing, obese middle-age male and in no distress Neck - supple, no significant adenopathy Chest - clear to auscultation, no wheezes, rales or rhonchi, symmetric air entry Heart - normal rate, regular rhythm, normal S1, S2, no murmurs, rubs, clicks or gallops Extremities - peripheral pulses normal, no pedal edema, no clubbing or cyanosis  CMP Latest Ref Rng & Units 01/26/2021 01/05/2021 01/04/2021  Glucose  65 - 99 mg/dL 83 103(H) 93  BUN 6 - 24 mg/dL 11 15 16   Creatinine 0.76 - 1.27 mg/dL 1.24 1.69(H) 1.30(H)  Sodium 134 - 144 mmol/L 140 138 136  Potassium 3.5 - 5.2 mmol/L 4.4 4.3 3.4(L)  Chloride 96 - 106 mmol/L 104 103 103  CO2 20 - 29 mmol/L 21 29 23   Calcium 8.7 - 10.2 mg/dL 9.6 9.3 9.2  Total Protein 6.5 - 8.1 g/dL - 6.8 6.9  Total Bilirubin 0.3 - 1.2 mg/dL - 0.6 0.7  Alkaline Phos 38 - 126 U/L - 77 76  AST 15 - 41 U/L - 29 18  ALT 0 - 44 U/L - 30 22   Lipid Panel     Component Value Date/Time   CHOL 198 01/04/2021 0116   CHOL 201 (H) 01/13/2018 1653   TRIG 174 (H) 01/04/2021 0116   HDL 29 (L) 01/04/2021 0116   HDL 35 (L) 01/13/2018 1653   CHOLHDL 6.8 01/04/2021 0116   VLDL 35 01/04/2021 0116   LDLCALC 134 (H) 01/04/2021 0116   LDLCALC 144 (H) 06/07/2020 0948    CBC    Component Value Date/Time   WBC 8.9 01/26/2021 1205   WBC 7.5 01/05/2021 0256   RBC 4.78 01/26/2021 1205   RBC 4.69 01/05/2021 0256   HGB 15.5 01/26/2021 1205   HCT 46.0 01/26/2021 1205   PLT 256 01/26/2021 1205   MCV 96 01/26/2021 1205   MCH 32.4 01/26/2021 1205   MCH 33.0 01/05/2021 0256   MCHC 33.7 01/26/2021 1205   MCHC 34.4 01/05/2021 0256   RDW 13.6 01/26/2021 1205   LYMPHSABS 3.4 01/05/2021 0256   MONOABS 0.6 01/05/2021 0256   EOSABS 0.3 01/05/2021 0256   BASOSABS 0.1 01/05/2021 0256    ASSESSMENT AND PLAN: 1. Essential  hypertension Not at goal.  Increase carvedilol to 6.25 mg twice a day.  Continue amlodipine.  Follow-up with clinical pharmacist in 1 month for repeat blood pressure check. - carvedilol (COREG) 6.25 MG tablet; Take 1 tablet (6.25 mg total) by mouth 2 (two) times daily with a meal.  Dispense: 180 tablet; Refill: 2  2. Former smoker Commended him on quitting.  Encouraged him to remain tobacco free.  3. Class 2 severe obesity due to excess calories with serious comorbidity and body mass index (BMI) of 38.0 to 38.9 in adult [facility]) Commended him on trying to exercise regularly. Dietary counseling given.  Advised to eliminate junk snacks and try to snack on fruits or nuts.  Advised to eat more lean white meat instead of beef or pork, cut back on portion sizes especially of white carbohydrates.  He declines referral to nutritionist.  4. Screening for colon cancer Referred for colonoscopy on last visit.  Looks like they tried to reach him last week unsuccessfully.  I will have my CMA give him the phone number to [facility] so he can call them back to schedule his colonoscopy.  5. Need for immunization against influenza - Flu Vaccine QUAD 72mo+IM (Fluarix, Fluzone & Alfiuria Quad PF)   Patient was given the opportunity to ask questions.  Patient verbalized understanding of the plan and was able to repeat key elements of the plan.   No orders of the defined types were placed in this encounter.    Requested Prescriptions    No prescriptions requested or ordered in this encounter    No follow-ups on file.  Karle Plumber, MD, FACP

## 2021-05-09 ENCOUNTER — Encounter: Payer: Self-pay | Admitting: Gastroenterology

## 2021-05-18 ENCOUNTER — Other Ambulatory Visit: Payer: Self-pay

## 2021-05-18 ENCOUNTER — Ambulatory Visit (HOSPITAL_COMMUNITY)
Admission: EM | Admit: 2021-05-18 | Discharge: 2021-05-18 | Disposition: A | Discharge status: Home / Self Care | Payer: 59 | Attending: Urgent Care | Admitting: Urgent Care

## 2021-05-18 DIAGNOSIS — N183 Chronic kidney disease, stage 3 unspecified: Secondary | ICD-10-CM | POA: Diagnosis not present

## 2021-05-18 DIAGNOSIS — B20 Human immunodeficiency virus [HIV] disease: Secondary | ICD-10-CM

## 2021-05-18 DIAGNOSIS — L03317 Cellulitis of buttock: Secondary | ICD-10-CM

## 2021-05-18 DIAGNOSIS — M7918 Myalgia, other site: Secondary | ICD-10-CM | POA: Diagnosis not present

## 2021-05-18 MED ORDER — DOXYCYCLINE HYCLATE 100 MG PO CAPS: 100.0000 mg | ORAL_CAPSULE | Freq: Two times a day (BID) | ORAL | 0 refills | Status: DC

## 2021-05-18 NOTE — ED Provider Notes (Signed)
Subjective:   Drew Cuevas is a 50 y.o. male with pmh of diverticulitis, CKD III, HIV disease, cerebellar stroke presenting for 2-day history of recurrent right buttock pain.  Patient states that he gets boils over this area, has never had to have this drained.  He has a PCP that has a clinic where a general surgeon comes once a month.  The last time he was evaluated was told that he did not need an incision and drainage but was advised to return if symptoms recur.  He has had a different abscess elsewhere and require drainage.  Has never had to have this area worked on.  No current facility-administered medications for this encounter.  Current Outpatient Medications:    clopidogrel (PLAVIX) 75 MG tablet, Take 1 tablet (75 mg total) by mouth daily., Disp: 30 tablet, Rfl: 0   abacavir-dolutegravir-lamiVUDine (TRIUMEQ) 600-50-300 MG tablet, Take 1 tablet by mouth daily., Disp: 30 tablet, Rfl: 11   acetaminophen (TYLENOL) 325 MG tablet, Take 2 tablets (650 mg total) by mouth every 6 (six) hours as needed for mild pain (or Fever >/= 101). (Patient not taking: Reported on 05/05/2021), Disp: , Rfl:    albuterol (VENTOLIN HFA) 108 (90 Base) MCG/ACT inhaler, Inhale 1-2 puffs into the lungs every 6 (six) hours as needed for wheezing or shortness of breath. (Patient not taking: Reported on 05/05/2021), Disp: , Rfl:    amLODipine (NORVASC) 10 MG tablet, Take 1 tablet (10 mg total) by mouth daily., Disp: 30 tablet, Rfl: 6   atorvastatin (LIPITOR) 80 MG tablet, Take 1 tablet (80 mg total) by mouth daily., Disp: 30 tablet, Rfl: 6   carvedilol (COREG) 6.25 MG tablet, Take 1 tablet (6.25 mg total) by mouth 2 (two) times daily with a meal., Disp: 180 tablet, Rfl: 2   escitalopram (LEXAPRO) 10 MG tablet, TAKE 1 TABLET(10 MG) BY MOUTH DAILY (Patient not taking: Reported on 05/05/2021), Disp: 30 tablet, Rfl: 0   Misc. Devices MISC, 1 home blood pressure monitor, Disp: 1 Device, Rfl: 0   nicotine (NICODERM CQ - DOSED IN  MG/24 HOURS) 14 mg/24hr patch, Place 1 patch (14 mg total) onto the skin daily. (Patient not taking: Reported on 05/05/2021), Disp: 28 patch, Rfl: 1   traZODone (DESYREL) 50 MG tablet, TAKE 1/2 TABLET(25 MG) BY MOUTH AT BEDTIME, Disp: 15 tablet, Rfl: 0     Past Medical History:  Diagnosis Date   Bronchitis    LAST FLARE UP WAS NEW YEARS 2015   Cancer ([place])    kidney   GERD (gastroesophageal reflux disease)    NO MEDS   HIV (human immunodeficiency virus infection) ([place])    UNDER CONTROL WITH MEDICATIONS   Hypertension    Immune deficiency disorder ([place])    Renal insufficiency 05/17/2015   Renal mass, right      Past Surgical History:  Procedure Laterality Date   NO PAST SURGERIES     ROBOT ASSISTED LAPAROSCOPIC NEPHRECTOMY Right 08/19/2013   Procedure: ROBOTIC ASSISTED LAPAROSCOPIC RIGHT PARTIAL NEPHRECTOMY;  Surgeon: Ardis Hughs, MD;  Location: [facility];  Service: Urology;  Laterality: Right;    Family History  Problem Relation Age of Onset   Hypertension Mother    Hypertension Sister    Hypertension Brother     Social History   Tobacco Use   Smoking status: Former   Smokeless tobacco: Never   Tobacco comments:    stopped May 2017  Vaping Use   Vaping Use: Never used  Substance Use  Topics   Alcohol use: No    Comment: stopped 10-12 years ago   Drug use: No    ROS   Objective:   Vitals: BP 132/89   Pulse 82   Temp 98.3 F (36.8 C)   Resp 18   SpO2 98%   Physical Exam Constitutional:      General: He is not in acute distress.    Appearance: Normal appearance. He is well-developed and normal weight. He is not ill-appearing, toxic-appearing or diaphoretic.  HENT:     Head: Normocephalic and atraumatic.     Right Ear: External ear normal.     Left Ear: External ear normal.     Nose: Nose normal.     Mouth/Throat:     Pharynx: Oropharynx is clear.  Eyes:     General: No scleral icterus.       Right eye: No discharge.        Left eye: No  discharge.     Extraocular Movements: Extraocular movements intact.     Pupils: Pupils are equal, round, and reactive to light.  Cardiovascular:     Rate and Rhythm: Normal rate.  Pulmonary:     Effort: Pulmonary effort is normal.  Musculoskeletal:     Cervical back: Normal range of motion.  Skin:      Neurological:     Mental Status: He is alert and oriented to person, place, and time.  Psychiatric:        Mood and Affect: Mood normal.        Behavior: Behavior normal.        Thought Content: Thought content normal.        Judgment: Judgment normal.    Assessment and Plan :   PDMP not reviewed this encounter.  1. Cellulitis of buttock, right   2. Buttock pain   3. HIV disease ([place])   4. Stage 3 chronic kidney disease, unspecified whether stage 3a or 3b CKD ([place])    I advised patient against an incision and drainage as there was no fluctuance but patient insisted upon having this procedure as he was told before by a different provider that we would need to have it done.  I pointed out that they actually have never done it and therefore do not believe that he has a recurrent abscess here.  Ultimately I did numb patient up, did a 1 cm x 1 cm in in-depth incision.  There was no drainage of pus just slight bleeding.  Patient was okay with this.  Advised that he start doxycycline and follow-up with his regular doctor for recheck.  Use Tylenol for pain control. Counseled patient on potential for adverse effects with medications prescribed/recommended today, ER and return-to-clinic precautions discussed, patient verbalized understanding.    Jaynee Eagles, PA-C 05/18/21 1800

## 2021-05-18 NOTE — ED Triage Notes (Signed)
Triage by provider Jaynee Eagles PA

## 2021-05-18 NOTE — Discharge Instructions (Addendum)
Please change your dressing 2-3 times daily. Do not apply any ointments or creams. Each time you change your dressing, make sure you clean gently around the perimeter of the wound with gentle soap and warm water. Pat your wound dry and let it air out if possible for 1-2 hours before reapplying another dressing.  Follow-up as soon as possible with your regular doctor, general surgeon.

## 2021-05-23 ENCOUNTER — Other Ambulatory Visit: Payer: Self-pay | Admitting: Internal Medicine

## 2021-05-23 DIAGNOSIS — I10 Essential (primary) hypertension: Secondary | ICD-10-CM

## 2021-05-23 NOTE — Telephone Encounter (Signed)
Requested Prescriptions  Pending Prescriptions Disp Refills  . amLODipine (NORVASC) 10 MG tablet [Pharmacy Med Name: AMLODIPINE BESYLATE 10MG  TABLETS] 90 tablet 1    Sig: TAKE 1 TABLET(10 MG) BY MOUTH DAILY     Cardiovascular:  Calcium Channel Blockers Passed - 05/23/2021  6:20 AM      Passed - Last BP in normal range    BP Readings from Last 1 Encounters:  05/18/21 132/89         Passed - Valid encounter within last 6 months    Recent Outpatient Visits          2 weeks ago Essential hypertension   [place], MD   3 months ago Essential hypertension   [place], Jarome Matin, RPH-CPP   3 months ago Hospital discharge follow-up   [place], MD   4 months ago Essential hypertension   [place], MD   7 months ago Pilonidal disease   [place] Judeth Horn, MD

## 2021-05-25 ENCOUNTER — Other Ambulatory Visit: Payer: Self-pay | Admitting: Internal Medicine

## 2021-05-25 DIAGNOSIS — Z8673 Personal history of transient ischemic attack (TIA), and cerebral infarction without residual deficits: Secondary | ICD-10-CM

## 2021-05-25 NOTE — Telephone Encounter (Signed)
Requested Prescriptions  Pending Prescriptions Disp Refills  . clopidogrel (PLAVIX) 75 MG tablet [Pharmacy Med Name: CLOPIDOGREL 75MG  TABLETS] 30 tablet 5    Sig: TAKE 1 TABLET(75 MG) BY MOUTH DAILY     Hematology: Antiplatelets - clopidogrel Failed - 05/25/2021 10:30 AM      Failed - Evaluate AST, ALT within 2 months of therapy initiation.      Passed - ALT in normal range and within 360 days    ALT  Date Value Ref Range Status  01/05/2021 30 0 - 44 U/L Final         Passed - AST in normal range and within 360 days    AST  Date Value Ref Range Status  01/05/2021 29 15 - 41 U/L Final         Passed - HCT in normal range and within 180 days    Hematocrit  Date Value Ref Range Status  01/26/2021 46.0 37.5 - 51.0 % Final         Passed - HGB in normal range and within 180 days    Hemoglobin  Date Value Ref Range Status  01/26/2021 15.5 13.0 - 17.7 g/dL Final         Passed - PLT in normal range and within 180 days    Platelets  Date Value Ref Range Status  01/26/2021 256 150 - 450 x10E3/uL Final         Passed - Valid encounter within last 6 months    Recent Outpatient Visits          2 weeks ago Essential hypertension   [place], MD   3 months ago Essential hypertension   [place], Jarome Matin, RPH-CPP   3 months ago Hospital discharge follow-up   [place], MD   4 months ago Essential hypertension   [place], MD   7 months ago Pilonidal disease   [place] Judeth Horn, MD

## 2021-06-06 ENCOUNTER — Ambulatory Visit: Payer: 59 | Admitting: Gastroenterology

## 2021-06-12 ENCOUNTER — Encounter: Payer: Self-pay | Admitting: Internal Medicine

## 2021-06-14 ENCOUNTER — Other Ambulatory Visit: Payer: Self-pay | Admitting: Internal Medicine

## 2021-06-14 DIAGNOSIS — B20 Human immunodeficiency virus [HIV] disease: Secondary | ICD-10-CM

## 2021-06-23 ENCOUNTER — Ambulatory Visit (HOSPITAL_COMMUNITY)
Admission: EM | Admit: 2021-06-23 | Discharge: 2021-06-23 | Disposition: A | Discharge status: Home / Self Care | Payer: 59

## 2021-06-23 ENCOUNTER — Other Ambulatory Visit: Payer: Self-pay

## 2021-06-23 DIAGNOSIS — R509 Fever, unspecified: Secondary | ICD-10-CM

## 2021-06-23 DIAGNOSIS — R6883 Chills (without fever): Secondary | ICD-10-CM | POA: Diagnosis not present

## 2021-06-23 DIAGNOSIS — R519 Headache, unspecified: Secondary | ICD-10-CM | POA: Diagnosis not present

## 2021-07-12 ENCOUNTER — Encounter: Payer: Self-pay | Admitting: Internal Medicine

## 2021-07-17 ENCOUNTER — Encounter: Payer: Self-pay | Admitting: Adult Health

## 2021-07-17 ENCOUNTER — Ambulatory Visit: Payer: 59 | Admitting: Adult Health

## 2021-07-18 ENCOUNTER — Ambulatory Visit: Payer: 59 | Admitting: Internal Medicine

## 2021-07-18 DIAGNOSIS — L0293 Carbuncle, unspecified: Secondary | ICD-10-CM | POA: Insufficient documentation

## 2021-07-24 ENCOUNTER — Ambulatory Visit (HOSPITAL_COMMUNITY)
Admission: EM | Admit: 2021-07-24 | Discharge: 2021-07-24 | Disposition: A | Discharge status: Home / Self Care | Payer: 59 | Attending: Family Medicine | Admitting: Family Medicine

## 2021-07-24 ENCOUNTER — Encounter (HOSPITAL_COMMUNITY): Payer: Self-pay

## 2021-07-24 ENCOUNTER — Other Ambulatory Visit: Payer: Self-pay

## 2021-07-24 ENCOUNTER — Telehealth: Payer: Self-pay | Admitting: Internal Medicine

## 2021-07-24 DIAGNOSIS — J09X2 Influenza due to identified novel influenza A virus with other respiratory manifestations: Secondary | ICD-10-CM | POA: Diagnosis not present

## 2021-07-24 DIAGNOSIS — I10 Essential (primary) hypertension: Secondary | ICD-10-CM

## 2021-07-24 LAB — POC INFLUENZA A AND B ANTIGEN (URGENT CARE ONLY)
INFLUENZA A ANTIGEN, POC: POSITIVE — AB
INFLUENZA B ANTIGEN, POC: NEGATIVE

## 2021-07-24 MED ORDER — OSELTAMIVIR PHOSPHATE 75 MG PO CAPS: 75.0000 mg | ORAL_CAPSULE | Freq: Two times a day (BID) | ORAL | 0 refills | Status: DC

## 2021-07-24 NOTE — ED Triage Notes (Signed)
Pt presents with diarrhea, headache, sore throat and nasal congestion x 24 hours. States he has trouble swallowing and c/o body aches.

## 2021-07-24 NOTE — Discharge Instructions (Addendum)
Your flu test was positive for influenza A. Take oseltamivir twice daily for 5 days.  Rest and plenty of oral fluids.

## 2021-07-24 NOTE — ED Provider Notes (Signed)
[facility]    CSN: [id number] Arrival date & time: 07/24/21  1019      History   Chief Complaint Chief Complaint  Patient presents with   Headache   Sore Throat   Diarrhea    HPI Drew Cuevas is a 50 y.o. male.    Headache Associated symptoms: diarrhea   Sore Throat Associated symptoms include headaches.  Diarrhea Associated symptoms: headaches   Here with 1-2 days' history of cough, rhinorrhea, congestion, and diarrhea. Some fever, not measured, and some aches and malaise. Has thrown up a few times too.  Was exposed to a coworker/boss last week who was ill with similar symptoms. Pt did have a flu shot. Does report h/o intermittent asthma  Past Medical History:  Diagnosis Date   Bronchitis    LAST FLARE UP WAS NEW YEARS 2015   Cancer [facility])    kidney   GERD (gastroesophageal reflux disease)    NO MEDS   HIV (human immunodeficiency virus infection) ([place])    UNDER CONTROL WITH MEDICATIONS   Hypertension    Immune deficiency disorder ([place])    Renal insufficiency 05/17/2015   Renal mass, right     Patient Active Problem List   Diagnosis Date Noted   Recurrent boils 07/18/2021   Cerebellar stroke ([place]) 01/03/2021   COVID-19 virus infection 01/03/2021   Diverticulitis of large intestine with abscess 09/27/2020   Depression 06/19/2019   Other insomnia 06/19/2019   Chronic kidney disease, stage 3a ([place]) 04/15/2018   Tobacco dependence 01/13/2018   Primary osteoarthritis of right knee 04/03/2017   Dyslipidemia 06/14/2016   Renal cell carcinoma of right kidney ([place]) 08/19/2013   Renal cyst 05/21/2013   Obesity (BMI 30-39.9) 05/26/2012   Internal hemorrhoids 12/01/2008   Hypertension 04/28/2008   Major depressive disorder, single episode, severe ([place]) 12/20/2006   Human immunodeficiency virus (HIV) disease ([place]) 08/28/2006    Past Surgical History:  Procedure Laterality Date   NO PAST SURGERIES     ROBOT ASSISTED LAPAROSCOPIC NEPHRECTOMY Right  08/19/2013   Procedure: ROBOTIC ASSISTED LAPAROSCOPIC RIGHT PARTIAL NEPHRECTOMY;  Surgeon: Ardis Hughs, MD;  Location: [facility];  Service: Urology;  Laterality: Right;       Home Medications    Prior to Admission medications   Medication Sig Start Date End Date Taking? Authorizing Provider  oseltamivir (TAMIFLU) 75 MG capsule Take 1 capsule (75 mg total) by mouth every 12 (twelve) hours. 07/24/21  Yes Barrett Henle, MD  abacavir-dolutegravir-lamiVUDine (TRIUMEQ) 600-50-300 MG tablet Take 1 tablet by mouth daily. 01/18/21   Michel Bickers, MD  amLODipine (NORVASC) 10 MG tablet TAKE 1 TABLET(10 MG) BY MOUTH DAILY 05/23/21   Ladell Pier, MD  atorvastatin (LIPITOR) 80 MG tablet Take 1 tablet (80 mg total) by mouth daily. 02/06/21   Ladell Pier, MD  carvedilol (COREG) 6.25 MG tablet Take 1 tablet (6.25 mg total) by mouth 2 (two) times daily with a meal. 05/05/21   Ladell Pier, MD  clopidogrel (PLAVIX) 75 MG tablet TAKE 1 TABLET(75 MG) BY MOUTH DAILY 05/25/21   Ladell Pier, MD  doxycycline (VIBRAMYCIN) 100 MG capsule Take 1 capsule (100 mg total) by mouth 2 (two) times daily. 05/18/21   Jaynee Eagles, PA-C  Misc. Devices MISC 1 home blood pressure monitor 01/06/21   Ladell Pier, MD  traZODone (DESYREL) 50 MG tablet TAKE 1/2 TABLET(25 MG) BY MOUTH AT BEDTIME 12/24/20   Ladell Pier, MD    Family History  Family History  Problem Relation Age of Onset   Hypertension Mother    Hypertension Sister    Hypertension Brother     Social History Social History   Tobacco Use   Smoking status: Former   Smokeless tobacco: Never   Tobacco comments:    stopped May 2017  Vaping Use   Vaping Use: Never used  Substance Use Topics   Alcohol use: No    Comment: stopped 10-12 years ago   Drug use: No     Allergies   Ibuprofen, Penicillins, and Latex   Review of Systems Review of Systems  Gastrointestinal:  Positive for diarrhea.  Neurological:   Positive for headaches.    Physical Exam Triage Vital Signs ED Triage Vitals  Enc Vitals Group     BP 07/24/21 1244 (!) 133/95     Pulse Rate 07/24/21 1244 81     Resp 07/24/21 1244 18     Temp 07/24/21 1244 98.1 F (36.7 C)     Temp Source 07/24/21 1244 Oral     SpO2 07/24/21 1244 98 %     Weight --      Height --      Head Circumference --      Peak Flow --      Pain Score 07/24/21 1243 6     Pain Loc --      Pain Edu? --      Excl. in [place]? --    No data found.  Updated Vital Signs BP (!) 133/95 (BP Location: Right Arm) Comment: per PT, did not take BP meds today.   Pulse 81    Temp 98.1 F (36.7 C) (Oral)    Resp 18    SpO2 98%   Visual Acuity Right Eye Distance:   Left Eye Distance:   Bilateral Distance:    Right Eye Near:   Left Eye Near:    Bilateral Near:     Physical Exam Vitals reviewed.  Constitutional:      General: He is not in acute distress.    Appearance: He is not toxic-appearing.  HENT:     Right Ear: Tympanic membrane and ear canal normal.     Left Ear: Tympanic membrane and ear canal normal.     Nose: Congestion present.     Mouth/Throat:     Mouth: Mucous membranes are moist.     Pharynx: Oropharyngeal exudate (clear mucus draining) and posterior oropharyngeal erythema (mild erythema or tonsillar pillars and post OP) present.  Eyes:     Extraocular Movements: Extraocular movements intact.     Pupils: Pupils are equal, round, and reactive to light.  Cardiovascular:     Rate and Rhythm: Normal rate and regular rhythm.     Heart sounds: No murmur heard. Pulmonary:     Effort: Pulmonary effort is normal.     Breath sounds: No wheezing.  Musculoskeletal:     Cervical back: Neck supple.  Lymphadenopathy:     Cervical: No cervical adenopathy.  Skin:    Capillary Refill: Capillary refill takes less than 2 seconds.     Coloration: Skin is not jaundiced or pale.  Neurological:     Mental Status: He is alert and oriented to person, place, and  time.  Psychiatric:        Behavior: Behavior normal.     UC Treatments / Results  Labs (all labs ordered are listed, but only abnormal results are displayed) Labs Reviewed  POC INFLUENZA A AND  B ANTIGEN (URGENT CARE ONLY) - Abnormal; Notable for the following components:      Result Value   INFLUENZA A ANTIGEN, POC POSITIVE (*)    All other components within normal limits    EKG   Radiology No results found.  Procedures Procedures (including critical care time)  Medications Ordered in UC Medications - No data to display  Initial Impression / Assessment and Plan / UC Course  I have reviewed the triage vital signs and the nursing notes.  Pertinent labs & imaging results that were available during my care of the patient were reviewed by me and considered in my medical decision making (see chart for details).     Flu A is positive. Final Clinical Impressions(s) / UC Diagnoses   Final diagnoses:  None     Discharge Instructions      Your flu test was positive for influenza A. Take oseltamivir twice daily for 5 days.  Rest and plenty of oral fluids.     ED Prescriptions     Medication Sig Dispense Auth. Provider   oseltamivir (TAMIFLU) 75 MG capsule Take 1 capsule (75 mg total) by mouth every 12 (twelve) hours. 10 capsule Barrett Henle, MD      PDMP not reviewed this encounter.   Barrett Henle, MD 07/24/21 1323

## 2021-07-25 NOTE — Telephone Encounter (Signed)
Pt called in regarding this Rx and wanted to know why it was denied, please advise.

## 2021-07-25 NOTE — Telephone Encounter (Signed)
Returned pt call. Pt didn't answer.. was unable to lvm   Per Dr. Wynetta Emery last office note  ASSESSMENT AND PLAN: 1. Essential hypertension Not at goal.  Increase carvedilol to 6.25 mg twice a day.  Continue amlodipine.  Follow-up with clinical pharmacist in 1 month for repeat blood pressure check. - carvedilol (COREG) 6.25 MG tablet; Take 1 tablet (6.25 mg total) by mouth 2 (two) times daily with a meal.  Dispense: 180 tablet; Refill: 2  Pt is not on Carvedilol 3.125 mg.

## 2021-08-01 ENCOUNTER — Ambulatory Visit: Payer: 59 | Admitting: Internal Medicine

## 2021-08-02 ENCOUNTER — Other Ambulatory Visit: Payer: Self-pay | Admitting: Internal Medicine

## 2021-08-03 NOTE — Telephone Encounter (Signed)
Requested Prescriptions  Pending Prescriptions Disp Refills   atorvastatin (LIPITOR) 80 MG tablet [Pharmacy Med Name: ATORVASTATIN 80MG  TABLETS] 90 tablet 1    Sig: TAKE 1 TABLET(80 MG) BY MOUTH DAILY     Cardiovascular:  Antilipid - Statins Failed - 08/02/2021 10:20 AM      Failed - LDL in normal range and within 360 days    LDL Cholesterol (Calc)  Date Value Ref Range Status  06/07/2020 144 (H) mg/dL (calc) Final    Comment:    Reference range: <100 . Desirable range <100 mg/dL for primary prevention;   <70 mg/dL for patients with CHD or diabetic patients  with > or = 2 CHD risk factors. Marland Kitchen LDL-C is now calculated using the Martin-Hopkins  calculation, which is a validated novel method providing  better accuracy than the Friedewald equation in the  estimation of LDL-C.  Cresenciano Genre et al. Annamaria Helling. 2774;128(78): 2061-2068  (http://education.[employer].com/faq/FAQ164)    LDL Cholesterol  Date Value Ref Range Status  01/04/2021 134 (H) 0 - 99 mg/dL Final    Comment:           Total Cholesterol/HDL:CHD Risk Coronary Heart Disease Risk Table                     Men   Women  1/2 Average Risk   3.4   3.3  Average Risk       5.0   4.4  2 X Average Risk   9.6   7.1  3 X Average Risk  23.4   11.0        Use the calculated Patient Ratio above and the CHD Risk Table to determine the patient's CHD Risk.        ATP III CLASSIFICATION (LDL):  <100     mg/dL   Optimal  100-129  mg/dL   Near or Above                    Optimal  130-159  mg/dL   Borderline  160-189  mg/dL   High  >190     mg/dL   Very High Performed at [facility] [address] [place], [place] [address]          Failed - HDL in normal range and within 360 days    HDL  Date Value Ref Range Status  01/04/2021 29 (L) >40 mg/dL Final  01/13/2018 35 (L) >39 mg/dL Final         Failed - Triglycerides in normal range and within 360 days    Triglycerides  Date Value Ref Range Status  01/04/2021 174  (H) <150 mg/dL Final         Passed - Total Cholesterol in normal range and within 360 days    Cholesterol, Total  Date Value Ref Range Status  01/13/2018 201 (H) 100 - 199 mg/dL Final   Cholesterol  Date Value Ref Range Status  01/04/2021 198 0 - 200 mg/dL Final         Passed - Patient is not pregnant      Passed - Valid encounter within last 12 months    Recent Outpatient Visits          3 months ago Essential hypertension   [place], MD   5 months ago Essential hypertension   [place], Annie Main L, RPH-CPP   6 months  ago Hospital discharge follow-up   [place], MD   7 months ago Essential hypertension   [place], MD   10 months ago Pilonidal disease   [place] Judeth Horn, MD

## 2021-08-10 ENCOUNTER — Telehealth: Payer: Self-pay

## 2021-08-10 ENCOUNTER — Other Ambulatory Visit (HOSPITAL_COMMUNITY): Payer: Self-pay

## 2021-08-10 NOTE — Telephone Encounter (Signed)
[facility] Patient Advocate Encounter   Was successful in obtaining a [employer] copay card for Triumeq.  This copay card will make the patients copay 0.00.  I have spoken with the patient.        Ileene Patrick, [facility] Patient [facility] Phone: [phone] Fax:  [phone]

## 2021-08-21 ENCOUNTER — Other Ambulatory Visit: Payer: Self-pay

## 2021-08-21 ENCOUNTER — Other Ambulatory Visit: Payer: 59

## 2021-08-21 ENCOUNTER — Other Ambulatory Visit (HOSPITAL_COMMUNITY)
Admission: RE | Admit: 2021-08-21 | Discharge: 2021-08-21 | Disposition: A | Discharge status: Home / Self Care | Payer: 59 | Source: Ambulatory Visit | Attending: Internal Medicine | Admitting: Internal Medicine

## 2021-08-21 DIAGNOSIS — Z113 Encounter for screening for infections with a predominantly sexual mode of transmission: Secondary | ICD-10-CM | POA: Diagnosis not present

## 2021-08-21 DIAGNOSIS — Z79899 Other long term (current) drug therapy: Secondary | ICD-10-CM

## 2021-08-21 DIAGNOSIS — B20 Human immunodeficiency virus [HIV] disease: Secondary | ICD-10-CM | POA: Insufficient documentation

## 2021-08-21 DIAGNOSIS — R69 Illness, unspecified: Secondary | ICD-10-CM | POA: Diagnosis not present

## 2021-08-22 LAB — T-HELPER CELL (CD4) - (RCID CLINIC ONLY)
CD4 % Helper T Cell: 32 % — ABNORMAL LOW (ref 33–65)
CD4 T Cell Abs: 1259 /uL (ref 400–1790)

## 2021-08-23 LAB — URINE CYTOLOGY ANCILLARY ONLY
Chlamydia: NEGATIVE
Comment: NEGATIVE
Comment: NORMAL
Neisseria Gonorrhea: NEGATIVE

## 2021-08-24 LAB — FLUORESCENT TREPONEMAL AB(FTA)-IGG-BLD: Fluorescent Treponemal ABS: NONREACTIVE

## 2021-08-24 LAB — CBC WITH DIFFERENTIAL/PLATELET
Absolute Monocytes: 507 cells/uL (ref 200–950)
Basophils Absolute: 78 cells/uL (ref 0–200)
Basophils Relative: 1 %
Eosinophils Absolute: 218 cells/uL (ref 15–500)
Eosinophils Relative: 2.8 %
HCT: 42.8 % (ref 38.5–50.0)
Hemoglobin: 14.7 g/dL (ref 13.2–17.1)
Lymphs Abs: 3853 cells/uL (ref 850–3900)
MCH: 31.7 pg (ref 27.0–33.0)
MCHC: 34.3 g/dL (ref 32.0–36.0)
MCV: 92.4 fL (ref 80.0–100.0)
MPV: 11.7 fL (ref 7.5–12.5)
Monocytes Relative: 6.5 %
Neutro Abs: 3143 cells/uL (ref 1500–7800)
Neutrophils Relative %: 40.3 %
Platelets: 261 10*3/uL (ref 140–400)
RBC: 4.63 10*6/uL (ref 4.20–5.80)
RDW: 14.3 % (ref 11.0–15.0)
Total Lymphocyte: 49.4 %
WBC: 7.8 10*3/uL (ref 3.8–10.8)

## 2021-08-24 LAB — COMPLETE METABOLIC PANEL WITH GFR
AG Ratio: 1.3 (calc) (ref 1.0–2.5)
ALT: 38 U/L (ref 9–46)
AST: 19 U/L (ref 10–40)
Albumin: 4.2 g/dL (ref 3.6–5.1)
Alkaline phosphatase (APISO): 105 U/L (ref 36–130)
BUN/Creatinine Ratio: 9 (calc) (ref 6–22)
BUN: 13 mg/dL (ref 7–25)
CO2: 25 mmol/L (ref 20–32)
Calcium: 9.5 mg/dL (ref 8.6–10.3)
Chloride: 108 mmol/L (ref 98–110)
Creat: 1.4 mg/dL — ABNORMAL HIGH (ref 0.60–1.29)
Globulin: 3.2 g/dL (calc) (ref 1.9–3.7)
Glucose, Bld: 67 mg/dL (ref 65–99)
Potassium: 4 mmol/L (ref 3.5–5.3)
Sodium: 141 mmol/L (ref 135–146)
Total Bilirubin: 0.5 mg/dL (ref 0.2–1.2)
Total Protein: 7.4 g/dL (ref 6.1–8.1)
eGFR: 62 mL/min/{1.73_m2} (ref >=60)

## 2021-08-24 LAB — HIV-1 RNA QUANT-NO REFLEX-BLD
HIV 1 RNA Quant: NOT DETECTED Copies/mL
HIV-1 RNA Quant, Log: NOT DETECTED Log cps/mL

## 2021-08-24 LAB — RPR: RPR Ser Ql: REACTIVE — AB

## 2021-08-24 LAB — RPR TITER: RPR Titer: 1:1 {titer} — ABNORMAL HIGH

## 2021-09-05 ENCOUNTER — Ambulatory Visit (INDEPENDENT_AMBULATORY_CARE_PROVIDER_SITE_OTHER): Payer: 59 | Admitting: Internal Medicine

## 2021-09-05 ENCOUNTER — Encounter: Payer: Self-pay | Admitting: Internal Medicine

## 2021-09-05 ENCOUNTER — Other Ambulatory Visit: Payer: Self-pay

## 2021-09-05 DIAGNOSIS — B20 Human immunodeficiency virus [HIV] disease: Secondary | ICD-10-CM | POA: Diagnosis not present

## 2021-09-05 DIAGNOSIS — F322 Major depressive disorder, single episode, severe without psychotic features: Secondary | ICD-10-CM | POA: Diagnosis not present

## 2021-09-05 DIAGNOSIS — I639 Cerebral infarction, unspecified: Secondary | ICD-10-CM | POA: Diagnosis not present

## 2021-09-05 DIAGNOSIS — R69 Illness, unspecified: Secondary | ICD-10-CM | POA: Diagnosis not present

## 2021-09-05 MED ORDER — TRIUMEQ 600-50-300 MG PO TABS: 1.0000 | ORAL_TABLET | Freq: Every day | ORAL | 11 refills | Status: DC

## 2021-09-05 NOTE — Assessment & Plan Note (Signed)
I talked to him again about the absolute importance of quitting smoking in order to help reduce the risk of another CVA.

## 2021-09-05 NOTE — Assessment & Plan Note (Signed)
His depression is in remission. 

## 2021-09-05 NOTE — Assessment & Plan Note (Signed)
His infection has been under excellent, long-term control.  He will continue Triumeq and follow-up after lab work in 1 year.

## 2021-09-05 NOTE — Progress Notes (Signed)
Patient Active Problem List   Diagnosis Date Noted   Recurrent boils 07/18/2021    Priority: High   Cerebellar stroke ([place]) 01/03/2021    Priority: High   COVID-19 virus infection 01/03/2021    Priority: High   Human immunodeficiency virus (HIV) disease ([place]) 08/28/2006    Priority: High   Renal cell carcinoma of right kidney ([place]) 08/19/2013    Priority: Medium    Hypertension 04/28/2008    Priority: Medium    Diverticulitis of large intestine with abscess 09/27/2020   Depression 06/19/2019   Other insomnia 06/19/2019   Chronic kidney disease, stage 3a ([place]) 04/15/2018   Tobacco dependence 01/13/2018   Primary osteoarthritis of right knee 04/03/2017   Dyslipidemia 06/14/2016   Renal cyst 05/21/2013   Obesity (BMI 30-39.9) 05/26/2012   Internal hemorrhoids 12/01/2008   Major depressive disorder, single episode, severe ([place]) 12/20/2006    Patient's Medications  New Prescriptions   No medications on file  Previous Medications   AMLODIPINE (NORVASC) 10 MG TABLET    TAKE 1 TABLET(10 MG) BY MOUTH DAILY   ATORVASTATIN (LIPITOR) 80 MG TABLET    TAKE 1 TABLET(80 MG) BY MOUTH DAILY   CARVEDILOL (COREG) 6.25 MG TABLET    Take 1 tablet (6.25 mg total) by mouth 2 (two) times daily with a meal.   CLOPIDOGREL (PLAVIX) 75 MG TABLET    TAKE 1 TABLET(75 MG) BY MOUTH DAILY   DOXYCYCLINE (VIBRAMYCIN) 100 MG CAPSULE    Take 1 capsule (100 mg total) by mouth 2 (two) times daily.   MISC. DEVICES MISC    1 home blood pressure monitor   OSELTAMIVIR (TAMIFLU) 75 MG CAPSULE    Take 1 capsule (75 mg total) by mouth every 12 (twelve) hours.   TRAZODONE (DESYREL) 50 MG TABLET    TAKE 1/2 TABLET(25 MG) BY MOUTH AT BEDTIME  Modified Medications   Modified Medication Previous Medication   ABACAVIR-DOLUTEGRAVIR-LAMIVUDINE (TRIUMEQ) 600-50-300 MG TABLET abacavir-dolutegravir-lamiVUDine (TRIUMEQ) 600-50-300 MG tablet      Take 1 tablet by mouth daily.    Take 1 tablet by mouth daily.   Discontinued Medications   No medications on file    Subjective: Drew Cuevas is in for his routine HIV follow-up visit.  There was some mixup in his insurance last year but it did not cause him to miss any doses of his Triumeq.  He continues to tolerate it well.  He has not missed doses.  He had a CVA last June and still has some residual weakness in his left hand and arm.  Unfortunately he is still smoking cigarettes.  He has had an annual influenza vaccine and a COVID booster since he had COVID infection last year.  Review of Systems: Review of Systems  Constitutional:  Negative for fever and weight loss.  Respiratory:  Negative for cough.   Cardiovascular:  Negative for chest pain.  Neurological:  Positive for focal weakness.  Psychiatric/Behavioral:  Negative for depression. The patient is not nervous/anxious.    Past Medical History:  Diagnosis Date   Bronchitis    LAST FLARE UP WAS NEW YEARS 2015   Cancer [facility])    kidney   GERD (gastroesophageal reflux disease)    NO MEDS   HIV (human immunodeficiency virus infection) ([place])    UNDER CONTROL WITH MEDICATIONS   Hypertension    Immune deficiency disorder ([place])    Renal insufficiency 05/17/2015   Renal mass, right  Social History   Tobacco Use   Smoking status: Former   Smokeless tobacco: Never   Tobacco comments:    stopped May 2017  Vaping Use   Vaping Use: Never used  Substance Use Topics   Alcohol use: No    Comment: stopped 10-12 years ago   Drug use: No    Family History  Problem Relation Age of Onset   Hypertension Mother    Hypertension Sister    Hypertension Brother       Health Maintenance  Topic Date Due   FOOT EXAM  Never done   OPHTHALMOLOGY EXAM  Never done   URINE MICROALBUMIN  Never done   COLONOSCOPY (Pts 45-54yrs Insurance coverage will need to be confirmed)  Never done   COVID-19 Vaccine (4 - Booster for Pfizer series) 08/22/2020   HEMOGLOBIN A1C  07/06/2021   TETANUS/TDAP   07/24/2027   INFLUENZA VACCINE  Completed   Hepatitis C Screening  Completed   HIV Screening  Completed   HPV VACCINES  Aged Out    Objective:  Vitals:   09/05/21 1512  BP: 118/78  Pulse: 95  Temp: 97.9 F (36.6 C)  TempSrc: Oral  SpO2: 96%  Weight: 240 lb (108.9 kg)   Body mass index is 38.74 kg/m.  Physical Exam Constitutional:      Comments: He is in good spirits.  Cardiovascular:     Rate and Rhythm: Normal rate and regular rhythm.     Heart sounds: No murmur heard. Pulmonary:     Effort: Pulmonary effort is normal.     Breath sounds: Normal breath sounds.  Neurological:     Motor: Weakness present.     Gait: Gait normal.  Psychiatric:        Mood and Affect: Mood normal.    Lab Results Lab Results  Component Value Date   WBC 7.8 08/21/2021   HGB 14.7 08/21/2021   HCT 42.8 08/21/2021   MCV 92.4 08/21/2021   PLT 261 08/21/2021    Lab Results  Component Value Date   CREATININE 1.40 (H) 08/21/2021   BUN 13 08/21/2021   NA 141 08/21/2021   K 4.0 08/21/2021   CL 108 08/21/2021   CO2 25 08/21/2021    Lab Results  Component Value Date   ALT 38 08/21/2021   AST 19 08/21/2021   ALKPHOS 77 01/05/2021   BILITOT 0.5 08/21/2021    Lab Results  Component Value Date   CHOL 198 01/04/2021   HDL 29 (L) 01/04/2021   LDLCALC 134 (H) 01/04/2021   TRIG 174 (H) 01/04/2021   CHOLHDL 6.8 01/04/2021   Lab Results  Component Value Date   LABRPR REACTIVE (A) 08/21/2021   RPRTITER 1:1 (H) 08/21/2021   HIV 1 RNA Quant (Copies/mL)  Date Value  08/21/2021 Not Detected  01/18/2021 Not Detected  06/07/2020 <20   CD4 T Cell Abs (/uL)  Date Value  08/21/2021 1,259  06/07/2020 934  05/19/2019 953     Problem List Items Addressed This Visit       High   Human immunodeficiency virus (HIV) disease ([place])    His infection has been under excellent, long-term control.  He will continue Triumeq and follow-up after lab work in 1 year.      Relevant Medications    abacavir-dolutegravir-lamiVUDine (TRIUMEQ) 600-50-300 MG tablet   Other Relevant Orders   CBC   Comprehensive metabolic panel   Lipid panel   RPR   HIV-1 RNA quant-no reflex-bld  T-helper cells (CD4) count   Cerebellar stroke ([place])    I talked to him again about the absolute importance of quitting smoking in order to help reduce the risk of another CVA.        Unprioritized   Major depressive disorder, single episode, severe ([place])    His depression is in remission.         Michel Bickers, MD [facility] for [facility] [place] [facility] [phone] pager   [phone] cell 09/05/2021, 3:43 PM

## 2021-09-29 ENCOUNTER — Other Ambulatory Visit: Payer: Self-pay | Admitting: Internal Medicine

## 2021-09-29 DIAGNOSIS — I10 Essential (primary) hypertension: Secondary | ICD-10-CM

## 2021-09-29 DIAGNOSIS — Z8673 Personal history of transient ischemic attack (TIA), and cerebral infarction without residual deficits: Secondary | ICD-10-CM

## 2021-09-29 NOTE — Telephone Encounter (Signed)
Attempted to call patient- Rx not due RF- request is from different pharmacy. Left message for patient to call back to verify pharmacy ?

## 2021-09-29 NOTE — Telephone Encounter (Signed)
Requested Prescriptions  ?Pending Prescriptions Disp Refills  ?? clopidogrel (PLAVIX) 75 MG tablet [Pharmacy Med Name: CLOPIDOGREL 75 MG TABLET] 30 tablet 2  ?  Sig: TAKE 1 TABLET EVERY DAY  ?  ? Hematology: Antiplatelets - clopidogrel Failed - 09/29/2021  9:32 AM  ?  ?  Failed - Cr in normal range and within 360 days  ?  Creat  ?Date Value Ref Range Status  ?08/21/2021 1.40 (H) 0.60 - 1.29 mg/dL Final  ?   ?  ?  Passed - HCT in normal range and within 180 days  ?  HCT  ?Date Value Ref Range Status  ?08/21/2021 42.8 38.5 - 50.0 % Final  ? ?Hematocrit  ?Date Value Ref Range Status  ?01/26/2021 46.0 37.5 - 51.0 % Final  ?   ?  ?  Passed - HGB in normal range and within 180 days  ?  Hemoglobin  ?Date Value Ref Range Status  ?08/21/2021 14.7 13.2 - 17.1 g/dL Final  ?01/26/2021 15.5 13.0 - 17.7 g/dL Final  ?   ?  ?  Passed - PLT in normal range and within 180 days  ?  Platelets  ?Date Value Ref Range Status  ?08/21/2021 261 140 - 400 Thousand/uL Final  ?01/26/2021 256 150 - 450 x10E3/uL Final  ?   ?  ?  Passed - Valid encounter within last 6 months  ?  Recent Outpatient Visits   ?      ? 4 months ago Essential hypertension  ? [place] Ladell Pier, MD  ? 7 months ago Essential hypertension  ? [place], RPH-CPP  ? 8 months ago Hospital discharge follow-up  ? [place] Ladell Pier, MD  ? 9 months ago Essential hypertension  ? [place] Ladell Pier, MD  ? 12 months ago Pilonidal disease  ? [place], Jeneen Rinks, MD  ?  ?  ? ?  ?  ?  ?? amLODipine (NORVASC) 10 MG tablet [Pharmacy Med Name: AMLODIPINE BESYLATE 10 MG TAB] 30 tablet 2  ?  Sig: TAKE 1 TABLET EVERY DAY  ?  ? Cardiovascular: Calcium Channel Blockers 2 Passed - 09/29/2021  9:32 AM  ?  ?  Passed - Last BP in normal range  ?  BP Readings from Last 1 Encounters:   ?09/05/21 118/78  ?   ?  ?  Passed - Last Heart Rate in normal range  ?  Pulse Readings from Last 1 Encounters:  ?09/05/21 95  ?   ?  ?  Passed - Valid encounter within last 6 months  ?  Recent Outpatient Visits   ?      ? 4 months ago Essential hypertension  ? [place] Ladell Pier, MD  ? 7 months ago Essential hypertension  ? [place], RPH-CPP  ? 8 months ago Hospital discharge follow-up  ? [place] Ladell Pier, MD  ? 9 months ago Essential hypertension  ? [place] Ladell Pier, MD  ? 12 months ago Pilonidal disease  ? [place] Judeth Horn, MD  ?  ?  ? ?  ?  ?  ? ? ?

## 2021-09-29 NOTE — Telephone Encounter (Signed)
Pt called, LVM to call office back to discuss rx refill request d/t requesting to another pharmacy.  ?

## 2021-10-05 ENCOUNTER — Other Ambulatory Visit (HOSPITAL_COMMUNITY): Payer: Self-pay

## 2021-10-06 ENCOUNTER — Other Ambulatory Visit (HOSPITAL_COMMUNITY): Payer: Self-pay

## 2021-10-06 ENCOUNTER — Telehealth: Payer: Self-pay

## 2021-10-06 NOTE — Telephone Encounter (Signed)
[facility] Patient Advocate Encounter ?  ?I was successful in securing patient a $7500.00 grant from [facility] (PAF) to provide copayment coverage for Triumeq.  This will make the out of pocket cost $0.00.   ?  ?I have spoken with the patient.   ? ?The billing information is as follows and has been [facility] with [facility]  ? ? ? ? ? ?Dates of Eligibility: 10/06/21 through 10/07/22 ? ?Patient knows to call the office with questions or concerns. ? ?Ileene Patrick, CPhT ?Specialty Pharmacy Patient Advocate ?[facility] for Infectious Disease ?Phone: [phone] ?Fax:  [phone]  ?

## 2021-10-13 ENCOUNTER — Other Ambulatory Visit: Payer: Self-pay | Admitting: Internal Medicine

## 2021-10-13 NOTE — Telephone Encounter (Signed)
Requested medication (s) are due for refill today - no ? ?Requested medication (s) are on the active medication list -no ? ?Future visit scheduled -no ? ?Last refill: unknown ? ?Notes to clinic: Request RF: non delegated Rx, medication not on current medication list ? ?Requested Prescriptions  ?Pending Prescriptions Disp Refills  ? methocarbamol (ROBAXIN) 500 MG tablet [Pharmacy Med Name: METHOCARBAMOL 500 MG TABLET] 40 tablet   ?  Sig: TAKE 1 TABLET TWICE A DAY AS NEEDED FOR MUSCLE SPASMS  ?  ? Not Delegated - Analgesics:  Muscle Relaxants Failed - 10/13/2021  9:49 AM  ?  ?  Failed - This refill cannot be delegated  ?  ?  Passed - Valid encounter within last 6 months  ?  Recent Outpatient Visits   ? ?      ? 5 months ago Essential hypertension  ? [place] Ladell Pier, MD  ? 8 months ago Essential hypertension  ? [place], RPH-CPP  ? 8 months ago Hospital discharge follow-up  ? [place] Ladell Pier, MD  ? 9 months ago Essential hypertension  ? [place] Ladell Pier, MD  ? 1 year ago Pilonidal disease  ? [place] Judeth Horn, MD  ? ?  ?  ? ?  ?  ?  ? ? ? ?Requested Prescriptions  ?Pending Prescriptions Disp Refills  ? methocarbamol (ROBAXIN) 500 MG tablet [Pharmacy Med Name: METHOCARBAMOL 500 MG TABLET] 40 tablet   ?  Sig: TAKE 1 TABLET TWICE A DAY AS NEEDED FOR MUSCLE SPASMS  ?  ? Not Delegated - Analgesics:  Muscle Relaxants Failed - 10/13/2021  9:49 AM  ?  ?  Failed - This refill cannot be delegated  ?  ?  Passed - Valid encounter within last 6 months  ?  Recent Outpatient Visits   ? ?      ? 5 months ago Essential hypertension  ? [place] Ladell Pier, MD  ? 8 months ago Essential hypertension  ? [place], RPH-CPP  ?  8 months ago Hospital discharge follow-up  ? [place] Ladell Pier, MD  ? 9 months ago Essential hypertension  ? [place] Ladell Pier, MD  ? 1 year ago Pilonidal disease  ? [place] Judeth Horn, MD  ? ?  ?  ? ?  ?  ?  ? ? ? ?

## 2021-11-24 ENCOUNTER — Ambulatory Visit (HOSPITAL_COMMUNITY)
Admission: EM | Admit: 2021-11-24 | Discharge: 2021-11-24 | Disposition: A | Discharge status: Home / Self Care | Payer: 59 | Attending: Family Medicine | Admitting: Family Medicine

## 2021-11-24 ENCOUNTER — Encounter (HOSPITAL_COMMUNITY): Payer: Self-pay

## 2021-11-24 DIAGNOSIS — K529 Noninfective gastroenteritis and colitis, unspecified: Secondary | ICD-10-CM

## 2021-11-24 DIAGNOSIS — R197 Diarrhea, unspecified: Secondary | ICD-10-CM

## 2021-11-24 DIAGNOSIS — R112 Nausea with vomiting, unspecified: Secondary | ICD-10-CM | POA: Diagnosis not present

## 2021-11-24 MED ORDER — ONDANSETRON 4 MG PO TBDP
ORAL_TABLET | ORAL | Status: AC
  Filled 2021-11-24: qty 1

## 2021-11-24 MED ORDER — ONDANSETRON 4 MG PO TBDP
4.0000 mg | ORAL_TABLET | Freq: Once | ORAL | Status: AC
  Administered 2021-11-24: 4 mg via ORAL

## 2021-11-24 MED ORDER — ONDANSETRON HCL 4 MG PO TABS: 4.0000 mg | ORAL_TABLET | Freq: Four times a day (QID) | ORAL | 0 refills | Status: DC

## 2021-11-24 NOTE — Discharge Instructions (Signed)
You were seen today for a viral gastroenteritis.  ?I have given you zofran today, and have sent out a script for this to your pharmacy to help with nausea and vomiting.  Please eat a bland diet, and drink small amounts of clear liquids like sprite, 7-up or ginger ale.  ?Please return or go to the ER if you have worsening symptoms, or feeling that you are dehydrated.  ?

## 2021-11-24 NOTE — ED Triage Notes (Signed)
Pt presents with vomiting, diarrhea, intermittent dizziness, and chills since waking up this morning. ?

## 2021-11-24 NOTE — ED Provider Notes (Signed)
?[place] ? ? ? ?CSN: [id number] ?Arrival date & time: 11/24/21  7829 ? ? ?  ? ?History   ?Chief Complaint ?Chief Complaint  ?Patient presents with  ? Diarrhea  ? Dizziness  ? Emesis  ? ? ?HPI ?Drew Cuevas is a 51 y.o. male.  ? ?Patient is here for nausea, vomiting, diarrhea since this morning.  A bit dizzy.  + chills.  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  ?He did eat/drink this morning and he vomited.  ?No abd pain or cramping, slight achiness.  ? ?Past Medical History:  ?Diagnosis Date  ? Bronchitis   ? LAST FLARE UP WAS NEW YEARS 2015  ? Cancer [facility])   ? kidney  ? GERD (gastroesophageal reflux disease)   ? NO MEDS  ? HIV (human immunodeficiency virus infection) ([place])   ? UNDER CONTROL WITH MEDICATIONS  ? Hypertension   ? Immune deficiency disorder ([place])   ? Renal insufficiency 05/17/2015  ? Renal mass, right   ? ? ?Patient Active Problem List  ? Diagnosis Date Noted  ? Recurrent boils 07/18/2021  ? Cerebellar stroke ([place]) 01/03/2021  ? COVID-19 virus infection 01/03/2021  ? Diverticulitis of large intestine with abscess 09/27/2020  ? Depression 06/19/2019  ? Other insomnia 06/19/2019  ? Chronic kidney disease, stage 3a ([place]) 04/15/2018  ? Tobacco dependence 01/13/2018  ? Primary osteoarthritis of right knee 04/03/2017  ? Dyslipidemia 06/14/2016  ? Renal cell carcinoma of right kidney ([place]) 08/19/2013  ? Renal cyst 05/21/2013  ? Obesity (BMI 30-39.9) 05/26/2012  ? Internal hemorrhoids 12/01/2008  ? Hypertension 04/28/2008  ? Major depressive disorder, single episode, severe ([place]) 12/20/2006  ? Human immunodeficiency virus (HIV) disease ([place]) 08/28/2006  ? ? ?Past Surgical History:  ?Procedure Laterality Date  ? NO PAST SURGERIES    ? ROBOT ASSISTED LAPAROSCOPIC NEPHRECTOMY Right 08/19/2013  ? Procedure: ROBOTIC ASSISTED LAPAROSCOPIC RIGHT PARTIAL NEPHRECTOMY;  Surgeon: Ardis Hughs, MD;  Location: [facility];  Service: Urology;  Laterality: Right;  ? ? ? ? ? ?Home Medications    ? ?Prior to Admission medications   ?Medication Sig Start Date End Date Taking? Authorizing Provider  ?abacavir-dolutegravir-lamiVUDine (TRIUMEQ) 600-50-300 MG tablet Take 1 tablet by mouth daily. 09/05/21   Michel Bickers, MD  ?amLODipine (NORVASC) 10 MG tablet TAKE 1 TABLET EVERY DAY 09/29/21   Ladell Pier, MD  ?atorvastatin (LIPITOR) 80 MG tablet TAKE 1 TABLET(80 MG) BY MOUTH DAILY 08/03/21   Ladell Pier, MD  ?carvedilol (COREG) 6.25 MG tablet Take 1 tablet (6.25 mg total) by mouth 2 (two) times daily with a meal. 05/05/21   Ladell Pier, MD  ?clopidogrel (PLAVIX) 75 MG tablet TAKE 1 TABLET EVERY DAY 09/29/21   Ladell Pier, MD  ?doxycycline (VIBRAMYCIN) 100 MG capsule Take 1 capsule (100 mg total) by mouth 2 (two) times daily. ?Patient not taking: Reported on 09/05/2021 05/18/21   Jaynee Eagles, PA-C  ?methocarbamol (ROBAXIN) 500 MG tablet TAKE 1 TABLET TWICE A DAY AS NEEDED FOR MUSCLE SPASMS 10/16/21   Ladell Pier, MD  ?Misc. Devices MISC 1 home blood pressure monitor 01/06/21   Ladell Pier, MD  ?oseltamivir (TAMIFLU) 75 MG capsule Take 1 capsule (75 mg total) by mouth every 12 (twelve) hours. ?Patient not taking: Reported on 09/05/2021 07/24/21   Barrett Henle, MD  ?traZODone (DESYREL) 50 MG tablet TAKE 1/2 TABLET(25 MG) BY MOUTH AT BEDTIME 12/24/20   Ladell Pier, MD  ? ? ?  Family History ?Family History  ?Problem Relation Age of Onset  ? Hypertension Mother   ? Hypertension Sister   ? Hypertension Brother   ? ? ?Social History ?Social History  ? ?Tobacco Use  ? Smoking status: Former  ? Smokeless tobacco: Never  ? Tobacco comments:  ?  stopped May 2017  ?Vaping Use  ? Vaping Use: Never used  ?Substance Use Topics  ? Alcohol use: No  ?  Comment: stopped 10-12 years ago  ? Drug use: No  ? ? ? ?Allergies   ?Ibuprofen, Penicillins, and Latex ? ? ?Review of Systems ?Review of Systems  ?Constitutional:  Positive for chills and fatigue. Negative for appetite change and fever.   ?HENT: Negative.    ?Respiratory: Negative.    ?Cardiovascular: Negative.   ?Gastrointestinal:  Positive for diarrhea, nausea and vomiting.  ?Genitourinary: Negative.   ?Neurological:  Positive for dizziness.  ? ? ?Physical Exam ?Triage Vital Signs ?ED Triage Vitals  ?Enc Vitals Group  ?   BP 11/24/21 0919 136/77  ?   Pulse Rate 11/24/21 0916 83  ?   Resp 11/24/21 0916 17  ?   Temp 11/24/21 0916 98.1 ?F (36.7 ?C)  ?   Temp Source 11/24/21 0916 Oral  ?   SpO2 11/24/21 0916 100 %  ?   Weight --   ?   Height --   ?   Head Circumference --   ?   Peak Flow --   ?   Pain Score 11/24/21 0918 0  ?   Pain Loc --   ?   Pain Edu? --   ?   Excl. in [place]? --   ? ?No data found. ? ?Updated Vital Signs ?BP 136/77   Pulse 83   Temp 98.1 ?F (36.7 ?C) (Oral)   Resp 17   SpO2 100%  ? ?Visual Acuity ?Right Eye Distance:   ?Left Eye Distance:   ?Bilateral Distance:   ? ?Right Eye Near:   ?Left Eye Near:    ?Bilateral Near:    ? ?Physical Exam ?Constitutional:   ?   General: He is not in acute distress. ?   Appearance: Normal appearance. He is not ill-appearing.  ?HENT:  ?   Head: Normocephalic.  ?   Mouth/Throat:  ?   Mouth: Mucous membranes are moist.  ?Cardiovascular:  ?   Rate and Rhythm: Normal rate and regular rhythm.  ?Pulmonary:  ?   Effort: Pulmonary effort is normal.  ?   Breath sounds: Normal breath sounds.  ?Abdominal:  ?   Palpations: Abdomen is soft.  ?   Tenderness: There is abdominal tenderness in the periumbilical area.  ?Musculoskeletal:  ?   Cervical back: Normal range of motion and neck supple.  ?Skin: ?   General: Skin is warm.  ?Neurological:  ?   General: No focal deficit present.  ?   Mental Status: He is alert and oriented to person, place, and time.  ?Psychiatric:     ?   Mood and Affect: Mood normal.     ?   Behavior: Behavior normal.  ? ? ? ?UC Treatments / Results  ?Labs ?(all labs ordered are listed, but only abnormal results are displayed) ?Labs Reviewed - No data to display ? ?EKG ? ? ?Radiology ?No  results found. ? ?Procedures ?Procedures (including critical care time) ? ?Medications Ordered in UC ?Medications  ?ondansetron (ZOFRAN-ODT) disintegrating tablet 4 mg (has no administration in time range)  ? ? ?Initial  Impression / Assessment and Plan / UC Course  ?I have reviewed the triage vital signs and the nursing notes. ? ?Pertinent labs & imaging results that were available during my care of the patient were reviewed by me and considered in my medical decision making (see chart for details). ? ?  ?Final Clinical Impressions(s) / UC Diagnoses  ? ?Final diagnoses:  ?Nausea vomiting and diarrhea  ?Gastroenteritis  ? ? ? ?Discharge Instructions   ? ?  ?You were seen today for a viral gastroenteritis.  ?I have given you zofran today, and have sent out a script for this to your pharmacy to help with nausea and vomiting.  Please eat a bland diet, and drink small amounts of clear liquids like sprite, 7-up or ginger ale.  ?Please return or go to the ER if you have worsening symptoms, or feeling that you are dehydrated.  ? ? ? ?ED Prescriptions   ? ? Medication Sig Dispense Auth. Provider  ? ondansetron (ZOFRAN) 4 MG tablet Take 1 tablet (4 mg total) by mouth every 6 (six) hours. 12 tablet Rondel Oh, MD  ? ?  ? ?PDMP not reviewed this encounter. ?  Rondel Oh, MD ?11/24/21 [phone] ? ?

## 2021-12-26 ENCOUNTER — Emergency Department (HOSPITAL_COMMUNITY): Payer: 59

## 2021-12-26 ENCOUNTER — Encounter (HOSPITAL_COMMUNITY): Payer: Self-pay

## 2021-12-26 ENCOUNTER — Other Ambulatory Visit: Payer: Self-pay

## 2021-12-26 ENCOUNTER — Emergency Department (HOSPITAL_COMMUNITY)
Admission: EM | Admit: 2021-12-26 | Discharge: 2021-12-26 | Disposition: A | Discharge status: Home / Self Care | Payer: 59 | Attending: Emergency Medicine | Admitting: Emergency Medicine

## 2021-12-26 DIAGNOSIS — I10 Essential (primary) hypertension: Secondary | ICD-10-CM | POA: Diagnosis not present

## 2021-12-26 DIAGNOSIS — K5792 Diverticulitis of intestine, part unspecified, without perforation or abscess without bleeding: Secondary | ICD-10-CM | POA: Diagnosis not present

## 2021-12-26 DIAGNOSIS — R69 Illness, unspecified: Secondary | ICD-10-CM | POA: Diagnosis not present

## 2021-12-26 DIAGNOSIS — Z91040 Latex allergy status: Secondary | ICD-10-CM | POA: Insufficient documentation

## 2021-12-26 DIAGNOSIS — N281 Cyst of kidney, acquired: Secondary | ICD-10-CM | POA: Diagnosis not present

## 2021-12-26 DIAGNOSIS — Z79899 Other long term (current) drug therapy: Secondary | ICD-10-CM | POA: Insufficient documentation

## 2021-12-26 DIAGNOSIS — E119 Type 2 diabetes mellitus without complications: Secondary | ICD-10-CM | POA: Diagnosis not present

## 2021-12-26 DIAGNOSIS — Z7902 Long term (current) use of antithrombotics/antiplatelets: Secondary | ICD-10-CM | POA: Insufficient documentation

## 2021-12-26 DIAGNOSIS — K5730 Diverticulosis of large intestine without perforation or abscess without bleeding: Secondary | ICD-10-CM | POA: Diagnosis not present

## 2021-12-26 DIAGNOSIS — K429 Umbilical hernia without obstruction or gangrene: Secondary | ICD-10-CM | POA: Diagnosis not present

## 2021-12-26 DIAGNOSIS — Z21 Asymptomatic human immunodeficiency virus [HIV] infection status: Secondary | ICD-10-CM | POA: Insufficient documentation

## 2021-12-26 DIAGNOSIS — D72829 Elevated white blood cell count, unspecified: Secondary | ICD-10-CM | POA: Insufficient documentation

## 2021-12-26 DIAGNOSIS — R1030 Lower abdominal pain, unspecified: Secondary | ICD-10-CM | POA: Diagnosis not present

## 2021-12-26 DIAGNOSIS — K6389 Other specified diseases of intestine: Secondary | ICD-10-CM | POA: Diagnosis not present

## 2021-12-26 HISTORY — DX: Diverticulitis of intestine, part unspecified, without perforation or abscess without bleeding: K57.92

## 2021-12-26 LAB — COMPREHENSIVE METABOLIC PANEL
ALT: 30 U/L (ref 0–44)
AST: 20 U/L (ref 15–41)
Albumin: 3.9 g/dL (ref 3.5–5.0)
Alkaline Phosphatase: 91 U/L (ref 38–126)
Anion gap: 7 (ref 5–15)
BUN: 16 mg/dL (ref 6–20)
CO2: 23 mmol/L (ref 22–32)
Calcium: 9 mg/dL (ref 8.9–10.3)
Chloride: 113 mmol/L — ABNORMAL HIGH (ref 98–111)
Creatinine, Ser: 1.38 mg/dL — ABNORMAL HIGH (ref 0.61–1.24)
GFR, Estimated: >60 mL/min (ref >60)
Glucose, Bld: 81 mg/dL (ref 70–99)
Potassium: 3.7 mmol/L (ref 3.5–5.1)
Sodium: 143 mmol/L (ref 135–145)
Total Bilirubin: 0.5 mg/dL (ref 0.3–1.2)
Total Protein: 7.1 g/dL (ref 6.5–8.1)

## 2021-12-26 LAB — CBC WITH DIFFERENTIAL/PLATELET
Abs Immature Granulocytes: 0.02 10*3/uL (ref 0.00–0.07)
Basophils Absolute: 0.1 10*3/uL (ref 0.0–0.1)
Basophils Relative: 1 %
Eosinophils Absolute: 0.3 10*3/uL (ref 0.0–0.5)
Eosinophils Relative: 3 %
HCT: 41.3 % (ref 39.0–52.0)
Hemoglobin: 14.7 g/dL (ref 13.0–17.0)
Immature Granulocytes: 0 %
Lymphocytes Relative: 36 %
Lymphs Abs: 4 10*3/uL (ref 0.7–4.0)
MCH: 33.6 pg (ref 26.0–34.0)
MCHC: 35.6 g/dL (ref 30.0–36.0)
MCV: 94.5 fL (ref 80.0–100.0)
Monocytes Absolute: 1 10*3/uL (ref 0.1–1.0)
Monocytes Relative: 9 %
Neutro Abs: 5.6 10*3/uL (ref 1.7–7.7)
Neutrophils Relative %: 51 %
Platelets: 210 10*3/uL (ref 150–400)
RBC: 4.37 MIL/uL (ref 4.22–5.81)
RDW: 13.7 % (ref 11.5–15.5)
WBC: 11 10*3/uL — ABNORMAL HIGH (ref 4.0–10.5)
nRBC: 0 % (ref 0.0–0.2)

## 2021-12-26 LAB — URINALYSIS, ROUTINE W REFLEX MICROSCOPIC
Bilirubin Urine: NEGATIVE
Glucose, UA: NEGATIVE mg/dL
Hgb urine dipstick: NEGATIVE
Ketones, ur: NEGATIVE mg/dL
Leukocytes,Ua: NEGATIVE
Nitrite: NEGATIVE
Protein, ur: NEGATIVE mg/dL
Specific Gravity, Urine: 1.02 (ref 1.005–1.030)
pH: 6 (ref 5.0–8.0)

## 2021-12-26 LAB — LIPASE, BLOOD: Lipase: 51 U/L (ref 11–51)

## 2021-12-26 MED ORDER — METRONIDAZOLE 500 MG PO TABS
500.0000 mg | ORAL_TABLET | Freq: Once | ORAL | Status: AC
  Administered 2021-12-26: 500 mg via ORAL
  Filled 2021-12-26: qty 1

## 2021-12-26 MED ORDER — IOHEXOL 300 MG/ML  SOLN
100.0000 mL | Freq: Once | INTRAMUSCULAR | Status: AC | PRN
Start: 2021-12-26 — End: 2021-12-26
  Administered 2021-12-26: 100 mL via INTRAVENOUS

## 2021-12-26 MED ORDER — METRONIDAZOLE 500 MG PO TABS: 500.0000 mg | ORAL_TABLET | Freq: Two times a day (BID) | ORAL | 0 refills | Status: DC

## 2021-12-26 MED ORDER — CIPROFLOXACIN HCL 500 MG PO TABS: 500.0000 mg | ORAL_TABLET | Freq: Two times a day (BID) | ORAL | 0 refills | Status: AC

## 2021-12-26 MED ORDER — SODIUM CHLORIDE (PF) 0.9 % IJ SOLN
INTRAMUSCULAR | Status: AC
  Filled 2021-12-26: qty 50

## 2021-12-26 MED ORDER — METRONIDAZOLE 500 MG PO TABS: 500.0000 mg | ORAL_TABLET | Freq: Two times a day (BID) | ORAL | 0 refills | Status: AC

## 2021-12-26 MED ORDER — CIPROFLOXACIN HCL 500 MG PO TABS: 500.0000 mg | ORAL_TABLET | Freq: Two times a day (BID) | ORAL | 0 refills | Status: DC

## 2021-12-26 MED ORDER — CIPROFLOXACIN HCL 500 MG PO TABS
500.0000 mg | ORAL_TABLET | Freq: Once | ORAL | Status: AC
  Administered 2021-12-26: 500 mg via ORAL
  Filled 2021-12-26: qty 1

## 2021-12-26 NOTE — ED Provider Notes (Signed)
[place] DEPT Provider Note   CSN: [id number] Arrival date & time: 12/26/21  2032     History  Chief Complaint  Patient presents with   Abdominal Pain    Drew Cuevas is a 51 y.o. male.  HPI Patient presents with abdominal pain, change in bowel movements.  He has the pain is in the lower abdomen, nonradiating, moderate, not improved with anything.  There is associated decreased frequency of bowel movements with associated urge, however, to defecate more.  Was a similar to constellation of symptoms experienced about a year ago when he was diagnosed with diverticulitis.  No fever, syncope, chest pain, abdominal pain.    Home Medications Prior to Admission medications   Medication Sig Start Date End Date Taking? Authorizing Provider  ciprofloxacin (CIPRO) 500 MG tablet Take 1 tablet (500 mg total) by mouth every 12 (twelve) hours. 12/26/21  Yes Carmin Muskrat, MD  metroNIDAZOLE (FLAGYL) 500 MG tablet Take 1 tablet (500 mg total) by mouth 2 (two) times daily for 5 days. For ten days 12/26/21 12/31/21 Yes Carmin Muskrat, MD  abacavir-dolutegravir-lamiVUDine (TRIUMEQ) 600-50-300 MG tablet Take 1 tablet by mouth daily. 09/05/21   Michel Bickers, MD  amLODipine (NORVASC) 10 MG tablet TAKE 1 TABLET EVERY DAY 09/29/21   Ladell Pier, MD  atorvastatin (LIPITOR) 80 MG tablet TAKE 1 TABLET(80 MG) BY MOUTH DAILY 08/03/21   Ladell Pier, MD  carvedilol (COREG) 6.25 MG tablet Take 1 tablet (6.25 mg total) by mouth 2 (two) times daily with a meal. 05/05/21   Ladell Pier, MD  clopidogrel (PLAVIX) 75 MG tablet TAKE 1 TABLET EVERY DAY 09/29/21   Ladell Pier, MD  methocarbamol (ROBAXIN) 500 MG tablet TAKE 1 TABLET TWICE A DAY AS NEEDED FOR MUSCLE SPASMS 10/16/21   Ladell Pier, MD  Misc. Devices MISC 1 home blood pressure monitor 01/06/21   Ladell Pier, MD  ondansetron (ZOFRAN) 4 MG tablet Take 1 tablet (4 mg total) by mouth every 6 (six)  hours. 11/24/21   Rondel Oh, MD  traZODone (DESYREL) 50 MG tablet TAKE 1/2 TABLET(25 MG) BY MOUTH AT BEDTIME 12/24/20   Ladell Pier, MD      Allergies    Ibuprofen, Penicillins, and Latex    Review of Systems   Review of Systems  All other systems reviewed and are negative.  Physical Exam Updated Vital Signs BP (!) 121/96   Pulse 90   Temp 98 F (36.7 C) (Oral)   Resp 20   Ht '5\' 6"'$  (1.676 m)   Wt 106.1 kg   SpO2 97%   BMI 37.77 kg/m  Physical Exam Vitals and nursing note reviewed.  Constitutional:      General: He is not in acute distress.    Appearance: He is well-developed.  HENT:     Head: Normocephalic and atraumatic.  Eyes:     Conjunctiva/sclera: Conjunctivae normal.  Cardiovascular:     Rate and Rhythm: Normal rate and regular rhythm.  Pulmonary:     Effort: Pulmonary effort is normal. No respiratory distress.     Breath sounds: No stridor.  Abdominal:     General: There is no distension.     Tenderness: There is abdominal tenderness in the suprapubic area.  Skin:    General: Skin is warm and dry.  Neurological:     Mental Status: He is alert and oriented to person, place, and time.    ED Results / Procedures / Treatments  Labs (all labs ordered are listed, but only abnormal results are displayed) Labs Reviewed  COMPREHENSIVE METABOLIC PANEL - Abnormal; Notable for the following components:      Result Value   Chloride 113 (*)    Creatinine, Ser 1.38 (*)    All other components within normal limits  CBC WITH DIFFERENTIAL/PLATELET - Abnormal; Notable for the following components:   WBC 11.0 (*)    All other components within normal limits  LIPASE, BLOOD  URINALYSIS, ROUTINE W REFLEX MICROSCOPIC    EKG None  Radiology CT Abdomen Pelvis W Contrast  Result Date: 12/26/2021 CLINICAL DATA:  Nausea and vomiting.  Lower abdominal pain. EXAM: CT ABDOMEN AND PELVIS WITH CONTRAST TECHNIQUE: Multidetector CT imaging of the abdomen and pelvis  was performed using the standard protocol following bolus administration of intravenous contrast. RADIATION DOSE REDUCTION: This exam was performed according to the departmental dose-optimization program which includes automated exposure control, adjustment of the mA and/or kV according to patient size and/or use of iterative reconstruction technique. CONTRAST:  141m OMNIPAQUE IOHEXOL 300 MG/ML  SOLN COMPARISON:  CT abdomen and pelvis 10/01/2020 FINDINGS: Lower chest: No acute abnormality. Hepatobiliary: No focal liver abnormality is seen. No gallstones, gallbladder wall thickening, or biliary dilatation. Pancreas: Unremarkable. No pancreatic ductal dilatation or surrounding inflammatory changes. Spleen: Normal in size without focal abnormality. Adrenals/Urinary Tract: There is a 1.9 cm left renal cyst. Otherwise, the kidneys, adrenal glands and bladder are within normal limits. Stomach/Bowel: There is sigmoid colon diverticulosis with mild wall thickening and surrounding inflammation in the mid sigmoid colon compatible with acute diverticulitis. There is no evidence for abscess or perforation. No dilated bowel loops are seen. Small bowel, stomach and appendix are within normal limits. Vascular/Lymphatic: No significant vascular findings are present. No enlarged abdominal or pelvic lymph nodes. Reproductive: Prostate is unremarkable. Other: There is a small fat containing umbilical hernia. No free fluid. Musculoskeletal: No acute or significant osseous findings. IMPRESSION: 1. Acute uncomplicated sigmoid colon diverticulitis. Electronically Signed   By: ARonney AstersM.D.   On: 12/26/2021 21:44    Procedures Procedures    Medications Ordered in ED Medications  sodium chloride (PF) 0.9 % injection (has no administration in time range)  ciprofloxacin (CIPRO) tablet 500 mg (has no administration in time range)  metroNIDAZOLE (FLAGYL) tablet 500 mg (has no administration in time range)  iohexol (OMNIPAQUE)  300 MG/ML solution 100 mL (100 mLs Intravenous Contrast Given 12/26/21 2135)    ED Course/ Medical Decision Making/ A&P This patient with a Hx of HIV, hypertension, diverticulitis presents to the ED for concern of abdominal pain, bowel movement changes, this involves an extensive number of treatment options, and is a complaint that carries with it a high risk of complications and morbidity.    The differential diagnosis includes diverticulitis, abscess, perforation less likely, bacteremia, sepsis   Social Determinants of Health:  HIV, multiple other medical problems including hypertension, diabetes  Additional history obtained:  Additional history and/or information obtained from chart review, notable for multiple medical issues, with management as an outpatient including antiviral therapy for HIV   After the initial evaluation, orders, including: CT, labs were initiated.   Patient placed on Cardiac and Pulse-Oximetry Monitors. The patient was maintained on a cardiac monitor.  The cardiac monitored showed an rhythm of 90 sinus normal The patient was also maintained on pulse oximetry. The readings were typically 100% room air normal   On repeat evaluation of the patient stayed the same  Lab Tests:  I personally interpreted labs.  The pertinent results include: Unremarkable urinalysis, no substantial electrolyte abnormalities, mild renal dysfunction and leukocytosis 11,000  Imaging Studies ordered:  I independently visualized and interpreted imaging which showed diverticulitis no perforation I agree with the radiologist interpretation   Dispostion / Final MDM:  After consideration of the diagnostic results and the patient's response to treatment, patient has started antibiotics here, with a nonperitoneal abdomen no evidence for abscess or perforation, bacteremia or sepsis, patient has an outpatient physician with whom he will continue management of his diverticulitis.  Though he  is relatively immunocompromised with HIV, and hospitalization was a consideration, he is afebrile, in no distress here and close outpatient follow-up is reasonable.  Final Clinical Impression(s) / ED Diagnoses Final diagnoses:  Diverticulitis    Rx / DC Orders ED Discharge Orders          Ordered    ciprofloxacin (CIPRO) 500 MG tablet  Every 12 hours        12/26/21 2227    metroNIDAZOLE (FLAGYL) 500 MG tablet  2 times daily        12/26/21 2227              Carmin Muskrat, MD 12/26/21 2229

## 2021-12-26 NOTE — Discharge Instructions (Addendum)
As discussed, you have been diagnosed with diverticulitis.  This episode is not as progressed as your last one but it is still very importantly follow-up with your clinician as an outpatient, and discussed referral to gastroenterology.  Return here for concerning changes in your condition.

## 2021-12-26 NOTE — ED Triage Notes (Addendum)
Pt states that he started having lower abdominal pain yesterday. Last time he had this pain it was his diverticulitis, per pt.

## 2021-12-29 ENCOUNTER — Other Ambulatory Visit: Payer: Self-pay | Admitting: Internal Medicine

## 2021-12-29 DIAGNOSIS — I10 Essential (primary) hypertension: Secondary | ICD-10-CM

## 2021-12-29 DIAGNOSIS — Z8673 Personal history of transient ischemic attack (TIA), and cerebral infarction without residual deficits: Secondary | ICD-10-CM

## 2021-12-29 NOTE — Telephone Encounter (Signed)
Refilled with enough medication to last until upcoming OV 02/08/22- no further refills until seen in office Requested Prescriptions  Pending Prescriptions Disp Refills  . clopidogrel (PLAVIX) 75 MG tablet [Pharmacy Med Name: CLOPIDOGREL 75 MG TABLET] 42 tablet 0    Sig: TAKE 1 TABLET BY MOUTH EVERY DAY     Hematology: Antiplatelets - clopidogrel Failed - 12/29/2021 12:46 AM      Failed - Cr in normal range and within 360 days    Creat  Date Value Ref Range Status  08/21/2021 1.40 (H) 0.60 - 1.29 mg/dL Final   Creatinine, Ser  Date Value Ref Range Status  12/26/2021 1.38 (H) 0.61 - 1.24 mg/dL Final         Failed - Valid encounter within last 6 months    Recent Outpatient Visits          7 months ago Essential hypertension   [place], MD   10 months ago Essential hypertension   [place], Jarome Matin, RPH-CPP   11 months ago Hospital discharge follow-up   [place], Deborah B, MD   12 months ago Essential hypertension   [place], Deborah B, MD   1 year ago Pilonidal disease   [place] Judeth Horn, MD      Future Appointments            In 1 month Ladell Pier, MD [place] - HCT in normal range and within 180 days    HCT  Date Value Ref Range Status  12/26/2021 41.3 39.0 - 52.0 % Final   Hematocrit  Date Value Ref Range Status  01/26/2021 46.0 37.5 - 51.0 % Final         Passed - HGB in normal range and within 180 days    Hemoglobin  Date Value Ref Range Status  12/26/2021 14.7 13.0 - 17.0 g/dL Final  01/26/2021 15.5 13.0 - 17.7 g/dL Final         Passed - PLT in normal range and within 180 days    Platelets  Date Value Ref Range Status  12/26/2021 210 150 - 400 K/uL Final  01/26/2021 256 150 - 450  x10E3/uL Final         . amLODipine (NORVASC) 10 MG tablet [Pharmacy Med Name: AMLODIPINE BESYLATE 10 MG TAB] 42 tablet 0    Sig: TAKE 1 TABLET BY MOUTH EVERY DAY     Cardiovascular: Calcium Channel Blockers 2 Failed - 12/29/2021 12:46 AM      Failed - Valid encounter within last 6 months    Recent Outpatient Visits          7 months ago Essential hypertension   [place], Deborah B, MD   10 months ago Essential hypertension   [place], Jarome Matin, RPH-CPP   11 months ago Hospital discharge follow-up   [place], Deborah B, MD   12 months ago Essential hypertension   [place], Deborah B, MD   1 year ago Pilonidal disease   [place] Judeth Horn, MD      Future Appointments  In 1 month Ladell Pier, MD [place] BP in normal range    BP Readings from Last 1 Encounters:  12/26/21 130/88         Passed - Last Heart Rate in normal range    Pulse Readings from Last 1 Encounters:  12/26/21 86

## 2021-12-31 ENCOUNTER — Emergency Department (HOSPITAL_COMMUNITY): Payer: 59

## 2021-12-31 ENCOUNTER — Emergency Department (HOSPITAL_COMMUNITY)
Admission: EM | Admit: 2021-12-31 | Discharge: 2021-12-31 | Disposition: A | Discharge status: Home / Self Care | Payer: 59 | Attending: Emergency Medicine | Admitting: Emergency Medicine

## 2021-12-31 ENCOUNTER — Other Ambulatory Visit: Payer: Self-pay

## 2021-12-31 ENCOUNTER — Encounter (HOSPITAL_COMMUNITY): Payer: Self-pay

## 2021-12-31 DIAGNOSIS — R1032 Left lower quadrant pain: Secondary | ICD-10-CM | POA: Diagnosis not present

## 2021-12-31 DIAGNOSIS — Z21 Asymptomatic human immunodeficiency virus [HIV] infection status: Secondary | ICD-10-CM | POA: Diagnosis not present

## 2021-12-31 DIAGNOSIS — Z91040 Latex allergy status: Secondary | ICD-10-CM | POA: Insufficient documentation

## 2021-12-31 DIAGNOSIS — K5790 Diverticulosis of intestine, part unspecified, without perforation or abscess without bleeding: Secondary | ICD-10-CM | POA: Insufficient documentation

## 2021-12-31 DIAGNOSIS — K5792 Diverticulitis of intestine, part unspecified, without perforation or abscess without bleeding: Secondary | ICD-10-CM

## 2021-12-31 DIAGNOSIS — R69 Illness, unspecified: Secondary | ICD-10-CM | POA: Diagnosis not present

## 2021-12-31 LAB — COMPREHENSIVE METABOLIC PANEL
ALT: 40 U/L (ref 0–44)
AST: 32 U/L (ref 15–41)
Albumin: 3.7 g/dL (ref 3.5–5.0)
Alkaline Phosphatase: 82 U/L (ref 38–126)
Anion gap: 7 (ref 5–15)
BUN: 15 mg/dL (ref 6–20)
CO2: 22 mmol/L (ref 22–32)
Calcium: 8.9 mg/dL (ref 8.9–10.3)
Chloride: 108 mmol/L (ref 98–111)
Creatinine, Ser: 1.47 mg/dL — ABNORMAL HIGH (ref 0.61–1.24)
GFR, Estimated: 58 mL/min — ABNORMAL LOW (ref >60)
Glucose, Bld: 100 mg/dL — ABNORMAL HIGH (ref 70–99)
Potassium: 4.5 mmol/L (ref 3.5–5.1)
Sodium: 137 mmol/L (ref 135–145)
Total Bilirubin: 0.8 mg/dL (ref 0.3–1.2)
Total Protein: 6.9 g/dL (ref 6.5–8.1)

## 2021-12-31 LAB — URINALYSIS, ROUTINE W REFLEX MICROSCOPIC
Bacteria, UA: NONE SEEN
Bilirubin Urine: NEGATIVE
Glucose, UA: NEGATIVE mg/dL
Hgb urine dipstick: NEGATIVE
Ketones, ur: NEGATIVE mg/dL
Nitrite: NEGATIVE
Protein, ur: NEGATIVE mg/dL
Specific Gravity, Urine: 1.023 (ref 1.005–1.030)
pH: 6 (ref 5.0–8.0)

## 2021-12-31 LAB — CBC
HCT: 44.6 % (ref 39.0–52.0)
Hemoglobin: 14.9 g/dL (ref 13.0–17.0)
MCH: 32 pg (ref 26.0–34.0)
MCHC: 33.4 g/dL (ref 30.0–36.0)
MCV: 95.7 fL (ref 80.0–100.0)
Platelets: 205 10*3/uL (ref 150–400)
RBC: 4.66 MIL/uL (ref 4.22–5.81)
RDW: 14.1 % (ref 11.5–15.5)
WBC: 11.3 10*3/uL — ABNORMAL HIGH (ref 4.0–10.5)
nRBC: 0 % (ref 0.0–0.2)

## 2021-12-31 LAB — LIPASE, BLOOD: Lipase: 41 U/L (ref 11–51)

## 2021-12-31 MED ORDER — ONDANSETRON HCL 4 MG/2ML IJ SOLN
4.0000 mg | Freq: Once | INTRAMUSCULAR | Status: AC
  Administered 2021-12-31: 4 mg via INTRAVENOUS
  Filled 2021-12-31: qty 2

## 2021-12-31 MED ORDER — SODIUM CHLORIDE 0.9 % IV BOLUS
1000.0000 mL | Freq: Once | INTRAVENOUS | Status: AC
  Administered 2021-12-31: 1000 mL via INTRAVENOUS

## 2021-12-31 MED ORDER — MORPHINE SULFATE (PF) 4 MG/ML IV SOLN
4.0000 mg | Freq: Once | INTRAVENOUS | Status: AC
  Administered 2021-12-31: 4 mg via INTRAVENOUS
  Filled 2021-12-31: qty 1

## 2021-12-31 MED ORDER — IOHEXOL 300 MG/ML  SOLN
100.0000 mL | Freq: Once | INTRAMUSCULAR | Status: AC | PRN
  Administered 2021-12-31: 100 mL via INTRAVENOUS

## 2021-12-31 MED ORDER — ONDANSETRON HCL 4 MG PO TABS: 4.0000 mg | ORAL_TABLET | Freq: Four times a day (QID) | ORAL | 0 refills | Status: DC

## 2021-12-31 NOTE — ED Triage Notes (Signed)
Patient said he is having terrible abdominal pain all over. Has diverticulitis. Was seen Wednesday for the same but said his pain just wont go away.

## 2021-12-31 NOTE — ED Notes (Signed)
Patient transported to CT 

## 2021-12-31 NOTE — ED Provider Notes (Signed)
[facility] DEPT Provider Note   CSN: [id number] Arrival date & time: 12/31/21  0432     History  Chief Complaint  Patient presents with   Abdominal Pain    Drew Cuevas is a 51 y.o. male.  Patient with history of HIV, diverticulitis presents with worsening left lower quadrant abdominal pain after being diagnosed with diverticulitis last week.  Denies any fevers or chills.  Pain slightly worse than when it was last week.  Nothing makes it worse or better.  Has finished antibiotic except for maybe 1 or 2 pills.  Denies any diarrhea.  No chest pain or shortness of breath or cough or sputum production or black stools or bloody stools.  The history is provided by the patient.  Abdominal Pain     Home Medications Prior to Admission medications   Medication Sig Start Date End Date Taking? Authorizing Provider  abacavir-dolutegravir-lamiVUDine (TRIUMEQ) 600-50-300 MG tablet Take 1 tablet by mouth daily. 09/05/21   Michel Bickers, MD  amLODipine (NORVASC) 10 MG tablet TAKE 1 TABLET BY MOUTH EVERY DAY 12/29/21   Ladell Pier, MD  atorvastatin (LIPITOR) 80 MG tablet TAKE 1 TABLET(80 MG) BY MOUTH DAILY 08/03/21   Ladell Pier, MD  carvedilol (COREG) 6.25 MG tablet Take 1 tablet (6.25 mg total) by mouth 2 (two) times daily with a meal. 05/05/21   Ladell Pier, MD  ciprofloxacin (CIPRO) 500 MG tablet Take 1 tablet (500 mg total) by mouth every 12 (twelve) hours for 6 days. 12/26/21 01/01/22  Carmin Muskrat, MD  clopidogrel (PLAVIX) 75 MG tablet TAKE 1 TABLET BY MOUTH EVERY DAY 12/29/21   Ladell Pier, MD  methocarbamol (ROBAXIN) 500 MG tablet TAKE 1 TABLET TWICE A DAY AS NEEDED FOR MUSCLE SPASMS 10/16/21   Ladell Pier, MD  metroNIDAZOLE (FLAGYL) 500 MG tablet Take 1 tablet (500 mg total) by mouth 2 (two) times daily for 6 days. 12/26/21 01/01/22  Carmin Muskrat, MD  Misc. Devices MISC 1 home blood pressure monitor 01/06/21   Ladell Pier, MD   ondansetron (ZOFRAN) 4 MG tablet Take 1 tablet (4 mg total) by mouth every 6 (six) hours. 12/31/21   Marilyne Haseley, DO  traZODone (DESYREL) 50 MG tablet TAKE 1/2 TABLET(25 MG) BY MOUTH AT BEDTIME 12/24/20   Ladell Pier, MD      Allergies    Ibuprofen, Penicillins, and Latex    Review of Systems   Review of Systems  Gastrointestinal:  Positive for abdominal pain.   Physical Exam Updated Vital Signs BP 124/82   Pulse 95   Temp 98.1 F (36.7 C) (Oral)   Resp 18   Ht '5\' 6"'$  (1.676 m)   Wt 106.1 kg   SpO2 97%   BMI 37.77 kg/m  Physical Exam Vitals and nursing note reviewed.  Constitutional:      General: He is not in acute distress.    Appearance: He is well-developed.  HENT:     Head: Normocephalic and atraumatic.  Eyes:     Conjunctiva/sclera: Conjunctivae normal.  Cardiovascular:     Rate and Rhythm: Normal rate and regular rhythm.     Heart sounds: No murmur heard. Pulmonary:     Effort: Pulmonary effort is normal. No respiratory distress.     Breath sounds: Normal breath sounds.  Abdominal:     Palpations: Abdomen is soft.     Tenderness: There is abdominal tenderness in the left lower quadrant.  Musculoskeletal:  General: No swelling.     Cervical back: Neck supple.  Skin:    General: Skin is warm and dry.     Capillary Refill: Capillary refill takes less than 2 seconds.  Neurological:     Mental Status: He is alert.  Psychiatric:        Mood and Affect: Mood normal.    ED Results / Procedures / Treatments   Labs (all labs ordered are listed, but only abnormal results are displayed) Labs Reviewed  COMPREHENSIVE METABOLIC PANEL - Abnormal; Notable for the following components:      Result Value   Glucose, Bld 100 (*)    Creatinine, Ser 1.47 (*)    GFR, Estimated 58 (*)    All other components within normal limits  CBC - Abnormal; Notable for the following components:   WBC 11.3 (*)    All other components within normal limits  URINALYSIS,  ROUTINE W REFLEX MICROSCOPIC - Abnormal; Notable for the following components:   Leukocytes,Ua TRACE (*)    All other components within normal limits  LIPASE, BLOOD    EKG None  Radiology CT ABDOMEN PELVIS W CONTRAST  Result Date: 12/31/2021 CLINICAL DATA:  Left lower quadrant abdominal pain EXAM: CT ABDOMEN AND PELVIS WITH CONTRAST TECHNIQUE: Multidetector CT imaging of the abdomen and pelvis was performed using the standard protocol following bolus administration of intravenous contrast. RADIATION DOSE REDUCTION: This exam was performed according to the departmental dose-optimization program which includes automated exposure control, adjustment of the mA and/or kV according to patient size and/or use of iterative reconstruction technique. CONTRAST:  178m OMNIPAQUE IOHEXOL 300 MG/ML  SOLN COMPARISON:  Prior CT abdomen/pelvis 10/26/2021 FINDINGS: Lower chest: No acute abnormality. Hepatobiliary: No focal liver abnormality is seen. No gallstones, gallbladder wall thickening, or biliary dilatation. Pancreas: Unremarkable. No pancreatic ductal dilatation or surrounding inflammatory changes. Spleen: Normal in size without focal abnormality. Adrenals/Urinary Tract: Normal adrenal glands. Stable 1.8 cm left upper pole renal cyst. No hydronephrosis, nephrolithiasis or enhancing renal mass. Unremarkable ureters and bladder. Stomach/Bowel: Extensive diverticulosis of the sigmoid colon again visualized. Persistent but improved interstitial stranding in the pericolonic fat. No evidence of perforation or abscess. The appendix is normal. No evidence of bowel obstruction. Vascular/Lymphatic: No significant vascular findings are present. No enlarged abdominal or pelvic lymph nodes. Reproductive: Prostate is unremarkable. Other: No abdominal wall hernia or abnormality. No abdominopelvic ascites. Musculoskeletal: No acute fracture or aggressive appearing lytic or blastic osseous lesion. IMPRESSION: Persistent but  improving uncomplicated sigmoid diverticulitis. Electronically Signed   By: HJacqulynn CadetM.D.   On: 12/31/2021 08:57    Procedures Procedures    Medications Ordered in ED Medications  sodium chloride 0.9 % bolus 1,000 mL (1,000 mLs Intravenous New Bag/Given 12/31/21 0755)  morphine (PF) 4 MG/ML injection 4 mg (4 mg Intravenous Given 12/31/21 0755)  ondansetron (ZOFRAN) injection 4 mg (4 mg Intravenous Given 12/31/21 0819)  iohexol (OMNIPAQUE) 300 MG/ML solution 100 mL (100 mLs Intravenous Contrast Given 12/31/21 07169    ED Course/ Medical Decision Making/ A&P                           Medical Decision Making Amount and/or Complexity of Data Reviewed Labs: ordered. Radiology: ordered.  Risk Prescription drug management.   JJr Millironis here with abdominal pain.  History of HIV diverticulitis.  On antibiotics for diverticulitis that was diagnosed last week.  Worsening pain.  However normal vitals.  No fever.  Well-appearing.  Differential diagnosis is ongoing diverticulitis possibly now a complication versus less likely bowel obstruction, appendicitis.  We will obtain CBC, CMP, lipase, CT scan abdomen pelvis.  We will give a dose IV narcotic, IV fluids and reevaluate.  Per my review and interpretation of labs there is no significant anemia, electrolyte abnormality, leukocytosis.  CT scan shows improving diverticulitis.  Overall appears to be doing well.  Will prescribe Zofran.  Discharged in good condition.  This chart was dictated using voice recognition software.  Despite best efforts to proofread,  errors can occur which can change the documentation meaning.         Final Clinical Impression(s) / ED Diagnoses Final diagnoses:  Diverticulitis    Rx / DC Orders ED Discharge Orders          Ordered    ondansetron (ZOFRAN) 4 MG tablet  Every 6 hours        12/31/21 0901              Lennice Sites, DO 12/31/21 1833

## 2022-01-08 ENCOUNTER — Telehealth: Payer: Self-pay | Admitting: Internal Medicine

## 2022-01-08 ENCOUNTER — Encounter: Payer: Self-pay | Admitting: Internal Medicine

## 2022-01-08 NOTE — Telephone Encounter (Signed)
Opened in error

## 2022-01-09 NOTE — Telephone Encounter (Signed)
Pt was called and informed of letter being ready for pick up

## 2022-01-25 ENCOUNTER — Other Ambulatory Visit: Payer: Self-pay | Admitting: Internal Medicine

## 2022-01-25 DIAGNOSIS — B20 Human immunodeficiency virus [HIV] disease: Secondary | ICD-10-CM

## 2022-01-27 ENCOUNTER — Other Ambulatory Visit: Payer: Self-pay | Admitting: Internal Medicine

## 2022-01-27 DIAGNOSIS — Z8673 Personal history of transient ischemic attack (TIA), and cerebral infarction without residual deficits: Secondary | ICD-10-CM

## 2022-01-27 DIAGNOSIS — I10 Essential (primary) hypertension: Secondary | ICD-10-CM

## 2022-01-29 NOTE — Telephone Encounter (Signed)
Requested Prescriptions  Pending Prescriptions Disp Refills  . clopidogrel (PLAVIX) 75 MG tablet [Pharmacy Med Name: CLOPIDOGREL 75 MG TABLET] 30 tablet 1    Sig: TAKE 1 TABLET BY MOUTH EVERY DAY     Hematology: Antiplatelets - clopidogrel Failed - 01/27/2022 12:22 AM      Failed - Cr in normal range and within 360 days    Creat  Date Value Ref Range Status  08/21/2021 1.40 (H) 0.60 - 1.29 mg/dL Final   Creatinine, Ser  Date Value Ref Range Status  12/31/2021 1.47 (H) 0.61 - 1.24 mg/dL Final         Failed - Valid encounter within last 6 months    Recent Outpatient Visits          8 months ago Essential hypertension   [place], MD   11 months ago Essential hypertension   [place], [place], RPH-CPP   1 year ago Hospital discharge follow-up   [place], Deborah B, MD   1 year ago Essential hypertension   [place], Deborah B, MD   1 year ago Pilonidal disease   [place] Judeth Horn, MD      Future Appointments            In 1 week Ladell Pier, MD [place] - HCT in normal range and within 180 days    HCT  Date Value Ref Range Status  12/31/2021 44.6 39.0 - 52.0 % Final   Hematocrit  Date Value Ref Range Status  01/26/2021 46.0 37.5 - 51.0 % Final         Passed - HGB in normal range and within 180 days    Hemoglobin  Date Value Ref Range Status  12/31/2021 14.9 13.0 - 17.0 g/dL Final  01/26/2021 15.5 13.0 - 17.7 g/dL Final         Passed - PLT in normal range and within 180 days    Platelets  Date Value Ref Range Status  12/31/2021 205 150 - 400 K/uL Final  01/26/2021 256 150 - 450 x10E3/uL Final         . amLODipine (NORVASC) 10 MG tablet [Pharmacy Med Name: AMLODIPINE BESYLATE 10 MG TAB] 30  tablet 1    Sig: TAKE 1 TABLET BY MOUTH EVERY DAY     Cardiovascular: Calcium Channel Blockers 2 Failed - 01/27/2022 12:22 AM      Failed - Valid encounter within last 6 months    Recent Outpatient Visits          8 months ago Essential hypertension   [place], Deborah B, MD   11 months ago Essential hypertension   [place], [place], RPH-CPP   1 year ago Hospital discharge follow-up   [place], Deborah B, MD   1 year ago Essential hypertension   [place], Deborah B, MD   1 year ago Pilonidal disease   [place] Judeth Horn, MD      Future Appointments            In 1 week Ladell Pier, MD [place]  [place] - Last BP in normal range    BP Readings from Last 1 Encounters:  12/31/21 119/89         Passed - Last Heart Rate in normal range    Pulse Readings from Last 1 Encounters:  12/31/21 68

## 2022-02-08 ENCOUNTER — Other Ambulatory Visit: Payer: Self-pay | Admitting: Internal Medicine

## 2022-02-08 ENCOUNTER — Ambulatory Visit: Payer: 59 | Admitting: Internal Medicine

## 2022-02-08 DIAGNOSIS — B20 Human immunodeficiency virus [HIV] disease: Secondary | ICD-10-CM

## 2022-02-08 NOTE — Telephone Encounter (Signed)
Sent mychart message to confirm pharmacy.   Beryle Flock, RN

## 2022-02-16 ENCOUNTER — Other Ambulatory Visit: Payer: Self-pay | Admitting: Internal Medicine

## 2022-02-16 ENCOUNTER — Encounter: Payer: Self-pay | Admitting: Internal Medicine

## 2022-02-16 MED ORDER — ATORVASTATIN CALCIUM 80 MG PO TABS: ORAL_TABLET | ORAL | 1 refills | Status: DC

## 2022-03-06 ENCOUNTER — Other Ambulatory Visit: Payer: Self-pay | Admitting: Internal Medicine

## 2022-03-06 DIAGNOSIS — I10 Essential (primary) hypertension: Secondary | ICD-10-CM

## 2022-03-06 DIAGNOSIS — Z8673 Personal history of transient ischemic attack (TIA), and cerebral infarction without residual deficits: Secondary | ICD-10-CM

## 2022-03-06 NOTE — Telephone Encounter (Signed)
Requested medication (s) are due for refill today: yes  Requested medication (s) are on the active medication list: yes  Last refill:  12/29/21 given enough refill to last until appt that pt canceled   Future visit scheduled: yes in 2 weeks  Notes to clinic:  NT unable to provide further refills. Routing to office.   Requested Prescriptions  Pending Prescriptions Disp Refills   clopidogrel (PLAVIX) 75 MG tablet [Pharmacy Med Name: CLOPIDOGREL 75 MG TABLET] 30 tablet 1    Sig: TAKE 1 TABLET BY [place] DAY     Hematology: Antiplatelets - clopidogrel Failed - 03/06/2022 12:11 AM      Failed - Cr in normal range and within 360 days    Creat  Date Value Ref Range Status  08/21/2021 1.40 (H) 0.60 - 1.29 mg/dL Final   Creatinine, Ser  Date Value Ref Range Status  12/31/2021 1.47 (H) 0.61 - 1.24 mg/dL Final         Failed - Valid encounter within last 6 months    Recent Outpatient Visits           10 months ago Essential hypertension   [place], Dal[place], MD   1 year ago Essential hypertension   [place], [place], RPH-CPP   1 year ago Hospital discharge follow-up   [place], Deborah B, MD   1 year ago Essential hypertension   [place], Deborah B, MD   1 year ago Pilonidal disease   [place] Judeth Horn, MD       Future Appointments             In 2 months Ladell Pier, MD [place] - HCT in normal range and within 180 days    HCT  Date Value Ref Range Status  12/31/2021 44.6 39.0 - 52.0 % Final   Hematocrit  Date Value Ref Range Status  01/26/2021 46.0 37.5 - 51.0 % Final         Passed - HGB in normal range and within 180 days    Hemoglobin  Date Value Ref Range Status  12/31/2021 14.9 13.0 - 17.0  g/dL Final  01/26/2021 15.5 13.0 - 17.7 g/dL Final         Passed - PLT in normal range and within 180 days    Platelets  Date Value Ref Range Status  12/31/2021 205 150 - 400 K/uL Final  01/26/2021 256 150 - 450 x10E3/uL Final          amLODipine (NORVASC) 10 MG tablet [Pharmacy Med Name: AMLODIPINE BESYLATE 10 MG TAB] 30 tablet 1    Sig: TAKE 1 TABLET BY MOUTH EVERY DAY     Cardiovascular: Calcium Channel Blockers 2 Failed - 03/06/2022 12:11 AM      Failed - Valid encounter within last 6 months    Recent Outpatient Visits           10 months ago Essential hypertension   [place], Deborah B, MD   1 year ago Essential hypertension   [place], RPH-CPP   1 year ago Hospital discharge follow-up   [place],  MD   1 year ago Essential hypertension   [place], Deborah B, MD   1 year ago Pilonidal disease   [place] Judeth Horn, MD       Future Appointments             In 2 months Ladell Pier, MD [place] BP in normal range    BP Readings from Last 1 Encounters:  12/31/21 119/89         Passed - Last Heart Rate in normal range    Pulse Readings from Last 1 Encounters:  12/31/21 68

## 2022-04-12 ENCOUNTER — Ambulatory Visit: Payer: 59 | Attending: Internal Medicine | Admitting: Internal Medicine

## 2022-04-12 ENCOUNTER — Encounter: Payer: Self-pay | Admitting: Internal Medicine

## 2022-04-12 VITALS — BP 117/78 | HR 79 | Ht 66.0 in | Wt 231.3 lb

## 2022-04-12 DIAGNOSIS — I10 Essential (primary) hypertension: Secondary | ICD-10-CM | POA: Diagnosis not present

## 2022-04-12 DIAGNOSIS — Z23 Encounter for immunization: Secondary | ICD-10-CM

## 2022-04-12 DIAGNOSIS — I6930 Unspecified sequelae of cerebral infarction: Secondary | ICD-10-CM | POA: Diagnosis not present

## 2022-04-12 DIAGNOSIS — R110 Nausea: Secondary | ICD-10-CM

## 2022-04-12 DIAGNOSIS — Z1211 Encounter for screening for malignant neoplasm of colon: Secondary | ICD-10-CM

## 2022-04-12 DIAGNOSIS — Z85528 Personal history of other malignant neoplasm of kidney: Secondary | ICD-10-CM

## 2022-04-12 DIAGNOSIS — Z6837 Body mass index (BMI) 37.0-37.9, adult: Secondary | ICD-10-CM

## 2022-04-12 MED ORDER — ONDANSETRON HCL 4 MG PO TABS: 4.0000 mg | ORAL_TABLET | Freq: Every day | ORAL | 1 refills | Status: AC | PRN

## 2022-04-12 NOTE — Progress Notes (Signed)
Pt wants RF of Zofran the dissolvable tab

## 2022-04-12 NOTE — Progress Notes (Signed)
Patient ID: Drew Cuevas, male    DOB: Nov 13, 1970  MRN: [id number]  CC: Hypertension   Subjective: Drew Cuevas is a 51 y.o. male who presents for chronic ds management His concerns today include:  HTN, HL, CVA, Obesity, HIV,OA RT knee, rectal fistulectomy, Renal cell CA s/p partial nephrology, depression, CKD stage2-3, .past hx of DM cured   HYPERTENSION Currently taking: see medication list.  He is on amlodipine 10 mg daily and carvedilol 6.25 mg twice a day. Med Adherence: '[x]'$  Yes    '[]'$  No Medication side effects: '[]'$  Yes    '[x]'$  No Adherence with salt restriction: '[x]'$  Yes    '[]'$  No Home Monitoring?: '[]'$  Yes    '[x]'$  No Monitoring Frequency:  Home BP results range:  SOB? '[]'$  Yes    '[x]'$  No Chest Pain?: '[]'$  Yes    '[x]'$  No Leg swelling?: '[]'$  Yes    '[x]'$  No Headaches?: '[]'$  Yes    '[x]'$  No Dizziness? '[]'$  Yes    '[x]'$  No Comments:   Hx of CVA:  taking Plavix and Lipitor Still has issue using LT arm fully.  Has to think what he has to do with it so he tends not to use it as much  Hx of RT renal cell tumor 2015.  No longer f/u.  No blood in urine  Obesity: goes to gym 3-4x/wk but not in past 2 wks.  Does TM, stretching and some wgh.  Reports better eating habits, less pork, more leafy foods, not as much bread.  Less sodas, more water and juices.  Nausea in mornings sometimes.  RF on Zofran requested.  HM:  Rec Flu shot today.  Never got in with GI for c-scope.    Patient Active Problem List   Diagnosis Date Noted   Recurrent boils 07/18/2021   Cerebellar stroke ([place]) 01/03/2021   COVID-19 virus infection 01/03/2021   Diverticulitis of large intestine with abscess 09/27/2020   Depression 06/19/2019   Other insomnia 06/19/2019   Chronic kidney disease, stage 3a ([place]) 04/15/2018   Tobacco dependence 01/13/2018   Primary osteoarthritis of right knee 04/03/2017   Dyslipidemia 06/14/2016   Renal cell carcinoma of right kidney ([place]) 08/19/2013   Renal cyst 05/21/2013   Obesity (BMI 30-39.9)  05/26/2012   Internal hemorrhoids 12/01/2008   Hypertension 04/28/2008   Major depressive disorder, single episode, severe ([place]) 12/20/2006   Human immunodeficiency virus (HIV) disease ([place]) 08/28/2006     Current Outpatient Medications on File Prior to Visit  Medication Sig Dispense Refill   amLODipine (NORVASC) 10 MG tablet TAKE 1 TABLET BY MOUTH EVERY DAY 90 tablet 0   atorvastatin (LIPITOR) 80 MG tablet TAKE 1 TABLET(80 MG) BY MOUTH DAILY 90 tablet 1   carvedilol (COREG) 6.25 MG tablet Take 1 tablet (6.25 mg total) by mouth 2 (two) times daily with a meal. 180 tablet 2   clopidogrel (PLAVIX) 75 MG tablet TAKE 1 TABLET BY MOUTH EVERY DAY 90 tablet 0   methocarbamol (ROBAXIN) 500 MG tablet TAKE 1 TABLET TWICE A DAY AS NEEDED FOR MUSCLE SPASMS 40 tablet 0   Misc. Devices MISC 1 home blood pressure monitor 1 Device 0   ondansetron (ZOFRAN) 4 MG tablet Take 1 tablet (4 mg total) by mouth every 6 (six) hours. 12 tablet 0   traZODone (DESYREL) 50 MG tablet TAKE 1/2 TABLET(25 MG) BY MOUTH AT BEDTIME 15 tablet 0   TRIUMEQ 600-50-300 MG tablet TAKE 1 TABLET EVERY DAY 30 tablet 6  No current facility-administered medications on file prior to visit.    Allergies  Allergen Reactions   Ibuprofen Anaphylaxis, Swelling and Other (See Comments)    Dehydration Swelling of the "moist areas" (throat, mouth)   Penicillins Hives    Has patient had a PCN reaction causing immediate rash, facial/tongue/throat swelling, SOB or lightheadedness with hypotension: yes Has patient had a PCN reaction causing severe rash involving mucus membranes or skin necrosis: no-- pt had hives Has patient had a PCN reaction that required hospitalization no Has patient had a PCN reaction occurring within the last 10 years: no If all of the above answers are "NO", then may proceed with Cephalosporin use.    Latex Rash    Social History   Socioeconomic History   Marital status: Single    Spouse name: Not on file    Number of children: Not on file   Years of education: Not on file   Highest education level: Not on file  Occupational History   Not on file  Tobacco Use   Smoking status: Former   Smokeless tobacco: Never   Tobacco comments:    stopped May 2017  Vaping Use   Vaping Use: Never used  Substance and Sexual Activity   Alcohol use: No    Comment: stopped 10-12 years ago   Drug use: No   Sexual activity: Not Currently    Partners: Male    Comment: declined condoms - 6.22.22  Other Topics Concern   Not on file  Social History Narrative   Not on file   Social Determinants of Health   Financial Resource Strain: Not on file  Food Insecurity: Not on file  Transportation Needs: Not on file  Physical Activity: Not on file  Stress: Not on file  Social Connections: Not on file  Intimate Partner Violence: Not on file    Family History  Problem Relation Age of Onset   Hypertension Mother    Hypertension Sister    Hypertension Brother     Past Surgical History:  Procedure Laterality Date   NO PAST SURGERIES     ROBOT ASSISTED LAPAROSCOPIC NEPHRECTOMY Right 08/19/2013   Procedure: ROBOTIC ASSISTED LAPAROSCOPIC RIGHT PARTIAL NEPHRECTOMY;  Surgeon: Ardis Hughs, MD;  Location: [facility];  Service: Urology;  Laterality: Right;    ROS: Review of Systems Negative except as stated above  PHYSICAL EXAM: BP 117/78   Pulse 79   Ht '5\' 6"'$  (1.676 m)   Wt 231 lb 4.8 oz (104.9 kg)   SpO2 97%   BMI 37.33 kg/m   Wt Readings from Last 3 Encounters:  04/12/22 231 lb 4.8 oz (104.9 kg)  12/31/21 234 lb (106.1 kg)  12/26/21 234 lb (106.1 kg)    Physical Exam  General appearance - alert, well appearing, middle-age obese African-American male and in no distress Mental status - normal mood, behavior, speech, dress, motor activity, and thought processes Chest - clear to auscultation, no wheezes, rales or rhonchi, symmetric air entry Heart - normal rate, regular rhythm, normal S1, S2, no  murmurs, rubs, clicks or gallops Neurological -grip 5/5 bilaterally. Extremities - peripheral pulses normal, no pedal edema, no clubbing or cyanosis      Latest Ref Rng & Units 12/31/2021    8:03 AM 12/26/2021    8:43 PM 08/21/2021    4:00 PM  CMP  Glucose 70 - 99 mg/dL 100  81  67   BUN 6 - 20 mg/dL 15  16  13  Creatinine 0.61 - 1.24 mg/dL 1.47  1.38  1.40   Sodium 135 - 145 mmol/L 137  143  141   Potassium 3.5 - 5.1 mmol/L 4.5  3.7  4.0   Chloride 98 - 111 mmol/L 108  113  108   CO2 22 - 32 mmol/L '22  23  25   '$ Calcium 8.9 - 10.3 mg/dL 8.9  9.0  9.5   Total Protein 6.5 - 8.1 g/dL 6.9  7.1  7.4   Total Bilirubin 0.3 - 1.2 mg/dL 0.8  0.5  0.5   Alkaline Phos 38 - 126 U/L 82  91    AST 15 - 41 U/L 32  20  19   ALT 0 - 44 U/L 40  30  38    Lipid Panel     Component Value Date/Time   CHOL 198 01/04/2021 0116   CHOL 201 (H) 01/13/2018 1653   TRIG 174 (H) 01/04/2021 0116   HDL 29 (L) 01/04/2021 0116   HDL 35 (L) 01/13/2018 1653   CHOLHDL 6.8 01/04/2021 0116   VLDL 35 01/04/2021 0116   LDLCALC 134 (H) 01/04/2021 0116   LDLCALC 144 (H) 06/07/2020 0948    CBC    Component Value Date/Time   WBC 11.3 (H) 12/31/2021 0803   RBC 4.66 12/31/2021 0803   HGB 14.9 12/31/2021 0803   HGB 15.5 01/26/2021 1205   HCT 44.6 12/31/2021 0803   HCT 46.0 01/26/2021 1205   PLT 205 12/31/2021 0803   PLT 256 01/26/2021 1205   MCV 95.7 12/31/2021 0803   MCV 96 01/26/2021 1205   MCH 32.0 12/31/2021 0803   MCHC 33.4 12/31/2021 0803   RDW 14.1 12/31/2021 0803   RDW 13.6 01/26/2021 1205   LYMPHSABS 4.0 12/26/2021 2043   MONOABS 1.0 12/26/2021 2043   EOSABS 0.3 12/26/2021 2043   BASOSABS 0.1 12/26/2021 2043    ASSESSMENT AND PLAN:  1. Essential hypertension At goal.  Continue Norvasc 10 mg daily.  2. Class 2 severe obesity with serious comorbidity and body mass index (BMI) of 37.0 to 37.9 in adult, unspecified obesity type ([place]) Commended him on trying to eat healthy.  Encouraged him to  eliminate sugary drinks from the diet including sodas, juices and sweet tea.  Try to drink more water.  Encouraged him to get back to the gym and resume his regular exercise.  He plans to do so.  3. History of CVA with residual deficit - Ambulatory referral to Occupational Therapy  4. History of renal cell carcinoma Stable without signs or symptoms of recurrence  5. Screening for colon cancer - Ambulatory referral to Gastroenterology  6. Nausea - ondansetron (ZOFRAN) 4 MG tablet; Take 1 tablet (4 mg total) by mouth daily as needed for nausea or vomiting.  Dispense: 12 tablet; Refill: 1  7. Need for immunization against influenza - Flu Vaccine QUAD 37moIM (Fluarix, Fluzone & Alfiuria Quad PF)    Patient was given the opportunity to ask questions.  Patient verbalized understanding of the plan and was able to repeat key elements of the plan.   This documentation was completed using DRadio producer  Any transcriptional errors are unintentional.  No orders of the defined types were placed in this encounter.    Requested Prescriptions    No prescriptions requested or ordered in this encounter    No follow-ups on file.  DKarle Plumber MD, FACP

## 2022-04-13 ENCOUNTER — Other Ambulatory Visit: Payer: Self-pay | Admitting: Pharmacist

## 2022-04-13 ENCOUNTER — Ambulatory Visit: Payer: Self-pay | Admitting: *Deleted

## 2022-04-13 MED ORDER — ONDANSETRON 4 MG PO TBDP: 4.0000 mg | ORAL_TABLET | Freq: Every day | ORAL | 1 refills | Status: DC

## 2022-04-13 NOTE — Telephone Encounter (Signed)
Request for the Zofran that dissolves instead if the tablets sent to [facility] for Dr. Karle Plumber. He wants [facility] sent to the [facility] on [address] in [place].

## 2022-04-13 NOTE — Telephone Encounter (Signed)
Message from [place] sent at 04/13/2022  2:26 PM EDT  Summary: Medication   Pt states he was suppose to be prescribed a dissolvable nausea medication but was prescribed ondansetron (ZOFRAN) 4 MG tablet   Pt states zofran is not helping   Please fu w/ pt           Call History   Type Contact Phone/Fax User  04/13/2022 02:26 PM EDT Phone (Incoming) [name] "Country" (Self) [phone] Lemmie Evens) Sherron Flemings  04/13/2022 02:25 PM EDT Phone (Incoming) Windy Fast, Jeneen Rinks "[place]" (Self) [phone] Lemmie Evens) Leroy Kennedy R   Reason for Disposition  [1] Caller has URGENT medicine question about med that PCP or specialist prescribed AND [2] triager unable to answer question  Answer Assessment - Initial Assessment Questions 1. NAME of MEDICINE: "What medicine(s) are you calling about?"     Zofran 4 mg tablets 2. QUESTION: "What is your question?" (e.g., double dose of medicine, side effect)     I usually take the Zofran that dissolves in your mouth not the tablets.   The tablets don't work but the ones that dissolve do.   Can she order the ones that dissolve? 3. PRESCRIBER: "Who prescribed the medicine?" Reason: if prescribed by specialist, call should be referred to that group.     Dr. Karle Plumber 4. SYMPTOMS: "Do you have any symptoms?" If Yes, ask: "What symptoms are you having?"  "How bad are the symptoms (e.g., mild, moderate, severe)     N/A 5. PREGNANCY:  "Is there any chance that you are pregnant?" "When was your last menstrual period?"     N/A  Protocols used: Medication Question Call-A-AH

## 2022-04-29 ENCOUNTER — Encounter (HOSPITAL_COMMUNITY): Payer: Self-pay | Admitting: *Deleted

## 2022-04-29 ENCOUNTER — Ambulatory Visit (HOSPITAL_COMMUNITY)
Admission: EM | Admit: 2022-04-29 | Discharge: 2022-04-29 | Disposition: A | Discharge status: Home / Self Care | Payer: 59 | Attending: Physician Assistant | Admitting: Physician Assistant

## 2022-04-29 DIAGNOSIS — J069 Acute upper respiratory infection, unspecified: Secondary | ICD-10-CM | POA: Diagnosis not present

## 2022-04-29 DIAGNOSIS — R051 Acute cough: Secondary | ICD-10-CM | POA: Insufficient documentation

## 2022-04-29 DIAGNOSIS — Z1152 Encounter for screening for COVID-19: Secondary | ICD-10-CM | POA: Insufficient documentation

## 2022-04-29 DIAGNOSIS — K529 Noninfective gastroenteritis and colitis, unspecified: Secondary | ICD-10-CM | POA: Insufficient documentation

## 2022-04-29 LAB — RESP PANEL BY RT-PCR (FLU A&B, COVID) ARPGX2
Influenza A by PCR: NEGATIVE
Influenza B by PCR: NEGATIVE
SARS Coronavirus 2 by RT PCR: NEGATIVE

## 2022-04-29 MED ORDER — ALBUTEROL SULFATE HFA 108 (90 BASE) MCG/ACT IN AERS
INHALATION_SPRAY | RESPIRATORY_TRACT | Status: AC
  Filled 2022-04-29: qty 6.7

## 2022-04-29 MED ORDER — ONDANSETRON 4 MG PO TBDP: 4.0000 mg | ORAL_TABLET | Freq: Every day | ORAL | 1 refills | Status: DC

## 2022-04-29 MED ORDER — ONDANSETRON 4 MG PO TBDP
ORAL_TABLET | ORAL | Status: AC
  Filled 2022-04-29: qty 1

## 2022-04-29 MED ORDER — ALBUTEROL SULFATE HFA 108 (90 BASE) MCG/ACT IN AERS
2.0000 | INHALATION_SPRAY | Freq: Once | RESPIRATORY_TRACT | Status: AC
  Administered 2022-04-29: 2 via RESPIRATORY_TRACT

## 2022-04-29 MED ORDER — ONDANSETRON 4 MG PO TBDP
4.0000 mg | ORAL_TABLET | Freq: Once | ORAL | Status: AC
  Administered 2022-04-29: 4 mg via ORAL

## 2022-04-29 NOTE — ED Provider Notes (Signed)
[facility]    CSN: [id number] Arrival date & time: 04/29/22  1610      History   Chief Complaint Chief Complaint  Patient presents with   Diarrhea   Emesis   Headache    HPI Drew Cuevas is a 51 y.o. male.   11-year-old male presents with fever, cough, nausea and vomiting.  Patient indicates that since 3:00 this morning he has been having cough, chest congestion with shortness of breath and intermittent wheezing.  He relates he has had some upper respiratory congestion with rhinitis and postnasal drip, mainly clear production.  He also relates he started running fever this morning with chills, body aches and pain, and fatigue.  Patient indicates that he started with nausea earlier this morning, stomach cramping, has thrown up several times with the last episode being about 1:00 this afternoon, he also has diarrhea with loose watery stools.  He relates that his last episode of diarrhea was this afternoon before coming to the office.  He indicates that several of his coworkers at [employer] have had similar symptoms but nobody will give an indication what they have.  He relates he is tolerating fluids well.  He does smoke half a pack every 2 days, and has been doing so for many years.   Diarrhea Associated symptoms: headaches and vomiting   Emesis Associated symptoms: diarrhea and headaches   Headache Associated symptoms: diarrhea and vomiting     Past Medical History:  Diagnosis Date   Bronchitis    LAST FLARE UP WAS NEW YEARS 2015   Cancer [facility])    kidney   Diverticulitis    GERD (gastroesophageal reflux disease)    NO MEDS   HIV (human immunodeficiency virus infection) ([place])    UNDER CONTROL WITH MEDICATIONS   Hypertension    Immune deficiency disorder ([place])    Renal insufficiency 05/17/2015   Renal mass, right     Patient Active Problem List   Diagnosis Date Noted   Class 2 severe obesity due to excess calories with serious comorbidity and body  mass index (BMI) of 38.0 to 38.9 in adult ([place]) 04/12/2022   Recurrent boils 07/18/2021   Cerebellar stroke ([place]) 01/03/2021   COVID-19 virus infection 01/03/2021   Diverticulitis of large intestine with abscess 09/27/2020   Depression 06/19/2019   Other insomnia 06/19/2019   Chronic kidney disease, stage 3a ([place]) 04/15/2018   Tobacco dependence 01/13/2018   Primary osteoarthritis of right knee 04/03/2017   Dyslipidemia 06/14/2016   Renal cell carcinoma of right kidney ([place]) 08/19/2013   Renal cyst 05/21/2013   Obesity (BMI 30-39.9) 05/26/2012   Internal hemorrhoids 12/01/2008   Hypertension 04/28/2008   Major depressive disorder, single episode, severe ([place]) 12/20/2006   Human immunodeficiency virus (HIV) disease ([place]) 08/28/2006    Past Surgical History:  Procedure Laterality Date   NO PAST SURGERIES     ROBOT ASSISTED LAPAROSCOPIC NEPHRECTOMY Right 08/19/2013   Procedure: ROBOTIC ASSISTED LAPAROSCOPIC RIGHT PARTIAL NEPHRECTOMY;  Surgeon: Ardis Hughs, MD;  Location: [facility];  Service: Urology;  Laterality: Right;       Home Medications    Prior to Admission medications   Medication Sig Start Date End Date Taking? Authorizing Provider  amLODipine (NORVASC) 10 MG tablet TAKE 1 TABLET BY MOUTH EVERY DAY 03/07/22  Yes Ladell Pier, MD  atorvastatin (LIPITOR) 80 MG tablet TAKE 1 TABLET(80 MG) BY MOUTH DAILY 02/16/22  Yes Ladell Pier, MD  carvedilol (COREG) 6.25 MG tablet  Take 1 tablet (6.25 mg total) by mouth 2 (two) times daily with a meal. 05/05/21  Yes Ladell Pier, MD  clopidogrel (PLAVIX) 75 MG tablet TAKE 1 TABLET BY MOUTH EVERY DAY 03/07/22  Yes Ladell Pier, MD  methocarbamol (ROBAXIN) 500 MG tablet TAKE 1 TABLET TWICE A DAY AS NEEDED FOR MUSCLE SPASMS 10/16/21  Yes Ladell Pier, MD  Misc. Devices MISC 1 home blood pressure monitor 01/06/21  Yes Ladell Pier, MD  ondansetron (ZOFRAN) 4 MG tablet Take 1 tablet (4 mg total) by mouth daily  as needed for nausea or vomiting. 04/12/22  Yes Ladell Pier, MD  traZODone (DESYREL) 50 MG tablet TAKE 1/2 TABLET(25 MG) BY MOUTH AT BEDTIME 12/24/20  Yes Ladell Pier, MD  TRIUMEQ 600-50-300 MG tablet TAKE 1 TABLET EVERY DAY 02/08/22  Yes Michel Bickers, MD  ondansetron (ZOFRAN-ODT) 4 MG disintegrating tablet Take 1 tablet (4 mg total) by mouth daily. Prn for nausea or vomiting. 04/29/22   Nyoka Lint, PA-C    Family History Family History  Problem Relation Age of Onset   Hypertension Mother    Hypertension Sister    Hypertension Brother     Social History Social History   Tobacco Use   Smoking status: Former   Smokeless tobacco: Never   Tobacco comments:    stopped May 2017  Vaping Use   Vaping Use: Never used  Substance Use Topics   Alcohol use: No    Comment: stopped 10-12 years ago   Drug use: No     Allergies   Ibuprofen, Penicillins, and Latex   Review of Systems Review of Systems  Respiratory:  Positive for shortness of breath and wheezing.   Gastrointestinal:  Positive for diarrhea and vomiting.  Neurological:  Positive for headaches.     Physical Exam Triage Vital Signs ED Triage Vitals  Enc Vitals Group     BP 04/29/22 1729 (!) 138/90     Pulse Rate 04/29/22 1729 77     Resp 04/29/22 1729 18     Temp 04/29/22 1729 98.1 F (36.7 C)     Temp Source 04/29/22 1729 Oral     SpO2 04/29/22 1729 98 %     Weight --      Height --      Head Circumference --      Peak Flow --      Pain Score 04/29/22 1728 0     Pain Loc --      Pain Edu? --      Excl. in [place]? --    No data found.  Updated Vital Signs BP (!) 138/90 (BP Location: Left Arm)   Pulse 77   Temp 98.1 F (36.7 C) (Oral)   Resp 18   SpO2 98%   Visual Acuity Right Eye Distance:   Left Eye Distance:   Bilateral Distance:    Right Eye Near:   Left Eye Near:    Bilateral Near:     Physical Exam Constitutional:      Appearance: He is well-developed.  HENT:     Right  Ear: Tympanic membrane and ear canal normal.     Left Ear: Tympanic membrane and ear canal normal.     Mouth/Throat:     Mouth: Mucous membranes are moist.     Pharynx: Oropharynx is clear. No pharyngeal swelling or posterior oropharyngeal erythema.  Cardiovascular:     Rate and Rhythm: Normal rate and regular rhythm.  Heart sounds: Normal heart sounds.  Pulmonary:     Effort: Pulmonary effort is normal.     Breath sounds: Normal air entry. Wheezing present. No rhonchi or rales.  Abdominal:     General: Abdomen is flat. Bowel sounds are normal.     Palpations: Abdomen is soft.     Tenderness: There is generalized abdominal tenderness. There is no guarding or rebound.  Lymphadenopathy:     Cervical: No cervical adenopathy.  Neurological:     Mental Status: He is alert.      UC Treatments / Results  Labs (all labs ordered are listed, but only abnormal results are displayed) Labs Reviewed  RESP PANEL BY RT-PCR (FLU A&B, COVID) [id number]    EKG   Radiology No results found.  Procedures Procedures (including critical care time)  Medications Ordered in UC Medications  ondansetron (ZOFRAN-ODT) disintegrating tablet 4 mg (has no administration in time range)  albuterol (VENTOLIN HFA) 108 (90 Base) MCG/ACT inhaler 2 puff (2 puffs Inhalation Given 04/29/22 1811)    Initial Impression / Assessment and Plan / UC Course  I have reviewed the triage vital signs and the nursing notes.  Pertinent labs & imaging results that were available during my care of the patient were reviewed by me and considered in my medical decision making (see chart for details).    Plan: 1.  The gastroenteritis will be treated with the following: A.  Zofran every 6 hours to control nausea, vomiting and to slow the diarrhea. 2.  The wheezing and shortness of breath will be treated with the following: A.  Albuterol, 2 puffs every 6 hours on a regular basis to treat the wheezing and shortness of  breath. 3.  COVID and flu test are pending due to the patient having upper respiratory, fever, and GI symptoms. 4.  Patient is advised to follow-up with PCP or return to urgent care if symptoms fail to improve. Final Clinical Impressions(s) / UC Diagnoses   Final diagnoses:  Gastroenteritis  Acute upper respiratory infection  Acute cough  Encounter for screening for COVID-19     Discharge Instructions      COVID and flu test will be completed in 24 hours or less.  If you do not get a call from this office within 24 hours indicates these test are negative.  You can go on MyChart to view the test results when they post in 24 hours or less. Advised to use the albuterol inhaler, 2 puffs every 6 hours on a regular basis to help decrease wheezing and chest congestion. Advised to use the Zofran every 6 hours to help reduce nausea, stomach cramping, and to slow the diarrhea. Advised to increase fluid intake with clear liquids-Sprite-ginger ale-7-Up, break into a bland diet and use toast, crackers, grits, chicken broth, chicken noodle soup. Advised follow-up PCP or return to urgent care if symptoms fail to improve.    ED Prescriptions     Medication Sig Dispense Auth. Provider   ondansetron (ZOFRAN-ODT) 4 MG disintegrating tablet Take 1 tablet (4 mg total) by mouth daily. Prn for nausea or vomiting. 20 tablet Nyoka Lint, PA-C      PDMP not reviewed this encounter.   Ramondo, Dietze, PA-C 04/29/22 1811

## 2022-04-29 NOTE — ED Triage Notes (Signed)
Pt states that he has diarrhea, vomiting, headache and bodyaches since 3am today. He took some tylenol last night for the headache but no relief.

## 2022-04-29 NOTE — Discharge Instructions (Signed)
COVID and flu test will be completed in 24 hours or less.  If you do not get a call from this office within 24 hours indicates these test are negative.  You can go on MyChart to view the test results when they post in 24 hours or less. Advised to use the albuterol inhaler, 2 puffs every 6 hours on a regular basis to help decrease wheezing and chest congestion. Advised to use the Zofran every 6 hours to help reduce nausea, stomach cramping, and to slow the diarrhea. Advised to increase fluid intake with clear liquids-Sprite-ginger ale-7-Up, break into a bland diet and use toast, crackers, grits, chicken broth, chicken noodle soup. Advised follow-up PCP or return to urgent care if symptoms fail to improve.

## 2022-04-30 ENCOUNTER — Other Ambulatory Visit: Payer: Self-pay

## 2022-04-30 ENCOUNTER — Ambulatory Visit: Payer: 59 | Attending: Internal Medicine | Admitting: Occupational Therapy

## 2022-04-30 ENCOUNTER — Encounter: Payer: Self-pay | Admitting: Occupational Therapy

## 2022-04-30 DIAGNOSIS — R278 Other lack of coordination: Secondary | ICD-10-CM | POA: Insufficient documentation

## 2022-04-30 DIAGNOSIS — R29818 Other symptoms and signs involving the nervous system: Secondary | ICD-10-CM | POA: Insufficient documentation

## 2022-04-30 DIAGNOSIS — I69354 Hemiplegia and hemiparesis following cerebral infarction affecting left non-dominant side: Secondary | ICD-10-CM | POA: Diagnosis not present

## 2022-04-30 NOTE — Therapy (Signed)
OUTPATIENT OCCUPATIONAL THERAPY NEURO EVALUATION  Patient Name: Drew Cuevas MRN: [id number] DOB:02-08-1971, 51 y.o., male Today's Date: 04/30/2022  PCP: Ladell Pier, MD REFERRING PROVIDER: Ladell Pier, MD   OT End of Session - 04/30/22 1604     Visit Number 1    Authorization Type [employer]    Authorization Time Period VL: 66    OT Start Time 1443    OT Stop Time 1540    OT Time Calculation (min) 43 min    Behavior During Therapy WFL for tasks assessed/performed            Past Medical History:  Diagnosis Date   Bronchitis    LAST FLARE UP WAS NEW YEARS 2015   Cancer ([place])    kidney   Diverticulitis    GERD (gastroesophageal reflux disease)    NO MEDS   HIV (human immunodeficiency virus infection) ([place])    UNDER CONTROL WITH MEDICATIONS   Hypertension    Immune deficiency disorder ([place])    Renal insufficiency 05/17/2015   Renal mass, right    Past Surgical History:  Procedure Laterality Date   NO PAST SURGERIES     ROBOT ASSISTED LAPAROSCOPIC NEPHRECTOMY Right 08/19/2013   Procedure: ROBOTIC ASSISTED LAPAROSCOPIC RIGHT PARTIAL NEPHRECTOMY;  Surgeon: Ardis Hughs, MD;  Location: [facility];  Service: Urology;  Laterality: Right;   Patient Active Problem List   Diagnosis Date Noted   Class 2 severe obesity due to excess calories with serious comorbidity and body mass index (BMI) of 38.0 to 38.9 in adult [facility]) 04/12/2022   Recurrent boils 07/18/2021   Cerebellar stroke ([place]) 01/03/2021   COVID-19 virus infection 01/03/2021   Diverticulitis of large intestine with abscess 09/27/2020   Depression 06/19/2019   Other insomnia 06/19/2019   Chronic kidney disease, stage 3a ([place]) 04/15/2018   Tobacco dependence 01/13/2018   Primary osteoarthritis of right knee 04/03/2017   Dyslipidemia 06/14/2016   Renal cell carcinoma of right kidney ([place]) 08/19/2013   Renal cyst 05/21/2013   Obesity (BMI 30-39.9) 05/26/2012   Internal hemorrhoids 12/01/2008    Hypertension 04/28/2008   Major depressive disorder, single episode, severe ([place]) 12/20/2006   Human immunodeficiency virus (HIV) disease ([place]) 08/28/2006    ONSET DATE: 04/12/2022 (date of OT order)  REFERRING DIAG: I69.30 (ICD-10-CM) - History of CVA with residual deficit   THERAPY DIAG:  Hemiplegia and hemiparesis following cerebral infarction affecting left non-dominant side (HCC)  Other lack of coordination  Other symptoms and signs involving the nervous system  Rationale for Evaluation and Treatment Rehabilitation  SUBJECTIVE:   SUBJECTIVE STATEMENT: Pt arrives to OP OT w/ primary concern related to weakness and decreased functional use of his LUE 2/2 CVA in 2022. Reports he did receive an OT evaluation initially after his stroke, but declined proceeding with services at that time because he wanted to focus on his LE and balance/mobility. Pt accompanied by: self  PERTINENT HISTORY: acute L superior cerebellar artery as well acute R-sided brainstem and small subacute R frontal cortical infarct June 2022; PMH also significant for HIV, HTN, renal mass, and depression  PRECAUTIONS: None  WEIGHT BEARING RESTRICTIONS No  PAIN:  Are you having pain?  R shoulder pain w/ movement (6/10)  FALLS: Has patient fallen in last 6 months? 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; "I just want my arm better...my arm and my hand"   OBJECTIVE:   HAND DOMINANCE: Right  ADLs: Overall ADLs: Reports Mod Ind w/ all BADLs and IADLs; some difficulty cutting up food, donning socks, and manipulating clothing fasteners  MOBILITY STATUS:  Ambulated in/out of session w/out difficulty  FUNCTIONAL OUTCOME MEASURES: Upper  Extremity Functional Index: 56/80 Moderate difficulty: preparing food, fastening buttons, using tools/appliances, cleaning, laundry, opening a jar Quite a bit of difficulty: tying/lacing shoes, carrying a small suitcase w/ affected UE  UPPER EXTREMITY ROM:  WFL for tasks completed/assessed  UPPER EXTREMITY MMT: WFL for tasks completed/assessed  HAND FUNCTION: Grip strength: Right: 86 lbs; Left: 72 lbs  COORDINATION: Finger Nose Finger test: 8 in 20 sec 9 Hole Peg test: Right: 21.8 sec; Left: 59.41 sec  SENSATION: Reports occasional numbness/tingling of his L arm, typically at night  COGNITION: Overall cognitive status: Within functional limits for tasks assessed  VISION: Subjective report: Denies concerns; no significant visual history Baseline vision: Wears glasses all the time (bifocals)  VISION ASSESSMENT: WFL   TODAY'S TREATMENT:  None - evaluation only   PATIENT EDUCATION: Educated on role and purpose of OT as well as potential interventions and goals for therapy based on initial evaluation findings. Person educated: Patient Education method: Explanation Education comprehension: verbalized understanding   HOME EXERCISE PROGRAM: Coordination activities reviewed and administered this session   GOALS: Goals reviewed with patient? No  SHORT TERM GOALS: Target date: 05/30/22    Status:  1 Pt will verbalize understanding of adapted strategies and/or equipment PRN to increase safety and independence with ADLs and IADLs (e.g., laundry, housekeeping, clothing manipulative management)  Baseline: decreased knowledge of compensatory strategies/AE Initial  2 Pt will improve unilateral [place] for improved participation in higher level IADLs as evidenced by increasing finger-nose-finger test score to at least 10 w/ LUE in 20 seconds Baseline: 8 in 20 sec Initial    LONG TERM GOALS: Target date: 06/29/22    Status:  1 Pt will demonstrate independence w/ complete HEP  designed for NMR of LUE [facility], [place], and coordination  Baseline: to be administered Initial  2 Pt will improve efficiency during functional fine motor skills (e.g., buttons, ties/laces) by improving time to complete 9-HPT w/ LUE to at least 50 sec by discharge Baseline: Right: 21.8 sec; Left: 59.41 sec Initial  3 Pt will report being able to complete housekeeping/cleaning or laundry tasks w/ less than moderate difficulty, incorporating compensatory strategies, including AE prn Baseline: moderate difficulty Initial     ASSESSMENT:  CLINICAL IMPRESSION: Pt is a 51 y/o who presents to OP OT due to LUE weakness and decreased functional use s/p CVA in June 2022. PMH includes HTN, HLD, HIV, OA RT knee, renal cell CA s/p partial nephrology, depression, CKD stage 2-3, and past h/o DM. Pt currently lives in a single-level home and works as a Freight forwarder for a [employer]. Pt will benefit from skilled occupational therapy services to address NMR of LUE strength and coordination, altered sensation, GM/FM control, introduction of compensatory strategies/AE prn, and implementation of an HEP to improve participation and safety during higher level ADL tasks and IADLs to maximize functional use of LUE at non-dominant level.   PERFORMANCE DEFICITS in functional skills including coordination, sensation, [place], [place], and UE functional use.  IMPAIRMENTS are limiting patient from ADLs, IADLs, work, and leisure.   COMORBIDITIES may have co-morbidities  that affects occupational performance.  Patient will benefit from skilled OT to address above impairments and improve overall function.  MODIFICATION OR ASSISTANCE TO COMPLETE EVALUATION: No modification of tasks or assist necessary to complete an evaluation.  OT OCCUPATIONAL PROFILE AND HISTORY: Problem focused assessment: Including review of records relating to presenting problem.  CLINICAL DECISION MAKING: Moderate - several treatment options, min-mod task modification  necessary  REHAB POTENTIAL: Good  EVALUATION COMPLEXITY: Moderate   PLAN: OT FREQUENCY: 2x/week  OT DURATION: 8 weeks  PLANNED INTERVENTIONS: self care/ADL training, therapeutic exercise, therapeutic activity, neuromuscular re-education, manual therapy, functional mobility training, splinting, electrical stimulation, ultrasound, moist heat, cryotherapy, patient/family education, energy conservation, and DME and/or AE instructions  RECOMMENDED OTHER SERVICES: None at this time  CONSULTED AND AGREED WITH PLAN OF CARE: Patient  PLAN FOR NEXT SESSION: Introduce HEP for NMR of GM and [place] and coordination (targeting, coordination activities)   Kathrine Cords, MSOT, OTR/L 04/30/2022, 4:45 PM

## 2022-05-07 ENCOUNTER — Encounter: Payer: Self-pay | Admitting: Occupational Therapy

## 2022-05-07 ENCOUNTER — Ambulatory Visit: Payer: 59 | Admitting: Occupational Therapy

## 2022-05-07 DIAGNOSIS — R29818 Other symptoms and signs involving the nervous system: Secondary | ICD-10-CM

## 2022-05-07 DIAGNOSIS — R278 Other lack of coordination: Secondary | ICD-10-CM

## 2022-05-07 DIAGNOSIS — I69354 Hemiplegia and hemiparesis following cerebral infarction affecting left non-dominant side: Secondary | ICD-10-CM | POA: Diagnosis not present

## 2022-05-07 NOTE — Therapy (Unsigned)
OUTPATIENT OCCUPATIONAL THERAPY TREATMENT NOTE  Patient Name: Drew Cuevas MRN: [id number] DOB:[birth year], 51 y.o., male Today's Date: 05/07/2022  PCP: Ladell Pier, MD REFERRING PROVIDER: Ladell Pier, MD   OT End of Session - 05/07/22 1619     Visit Number 2    Authorization Type [employer]    Authorization Time Period VL: 44    OT Start Time 6295    OT Stop Time 2841    OT Time Calculation (min) 42 min    Activity Tolerance Patient tolerated treatment well    Behavior During Therapy WFL for tasks assessed/performed            Past Medical History:  Diagnosis Date   Bronchitis    LAST FLARE UP WAS NEW YEARS 2015   Cancer ([place])    kidney   Diverticulitis    GERD (gastroesophageal reflux disease)    NO MEDS   HIV (human immunodeficiency virus infection) ([place])    UNDER CONTROL WITH MEDICATIONS   Hypertension    Immune deficiency disorder ([place])    Renal insufficiency 05/17/2015   Renal mass, right    Past Surgical History:  Procedure Laterality Date   NO PAST SURGERIES     ROBOT ASSISTED LAPAROSCOPIC NEPHRECTOMY Right 08/19/2013   Procedure: ROBOTIC ASSISTED LAPAROSCOPIC RIGHT PARTIAL NEPHRECTOMY;  Surgeon: Ardis Hughs, MD;  Location: [facility];  Service: Urology;  Laterality: Right;   Patient Active Problem List   Diagnosis Date Noted   Class 2 severe obesity due to excess calories with serious comorbidity and body mass index (BMI) of 38.0 to 38.9 in adult [facility]) 04/12/2022   Recurrent boils 07/18/2021   Cerebellar stroke ([place]) 01/03/2021   COVID-19 virus infection 01/03/2021   Diverticulitis of large intestine with abscess 09/27/2020   Depression 06/19/2019   Other insomnia 06/19/2019   Chronic kidney disease, stage 3a ([place]) 04/15/2018   Tobacco dependence 01/13/2018   Primary osteoarthritis of right knee 04/03/2017   Dyslipidemia 06/14/2016   Renal cell carcinoma of right kidney ([place]) 08/19/2013   Renal cyst 05/21/2013   Obesity (BMI 30-39.9)  05/26/2012   Internal hemorrhoids 12/01/2008   Hypertension 04/28/2008   Major depressive disorder, single episode, severe ([place]) 12/20/2006   Human immunodeficiency virus (HIV) disease ([place]) 08/28/2006    ONSET DATE: 04/12/2022 (date of OT order)  REFERRING DIAG: I69.30 (ICD-10-CM) - History of CVA with residual deficit   THERAPY DIAG:  Hemiplegia and hemiparesis following cerebral infarction affecting left non-dominant side (HCC)  Other lack of coordination  Other symptoms and signs involving the nervous system  Rationale for Evaluation and Treatment Rehabilitation  SUBJECTIVE:   SUBJECTIVE STATEMENT: Pt reports things have been going well Pt accompanied by: self  PERTINENT HISTORY: acute L superior cerebellar artery as well acute R-sided brainstem and small subacute R frontal cortical infarct June 2022; PMH also significant for HIV, HTN, renal mass, and depression  PAIN: Are you having pain? 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; "I just want my arm better...my arm and my hand"   OBJECTIVE:   HAND DOMINANCE: Right  ADLs: Overall ADLs: Reports Mod Ind w/ all BADLs and IADLs; some difficulty cutting up food, donning socks, and manipulating clothing fasteners  Upper Extremity Functional Index: 56/80 Moderate difficulty: preparing food, fastening buttons, using tools/appliances, cleaning, laundry, opening a jar Quite a bit of difficulty: tying/lacing shoes, carrying a small  suitcase w/ affected UE  ------------------------------------------------------------------------------------------------------------------------------------------------------  TODAY'S TREATMENT:  Pegboard Activity Using L hand to pick up easy-grip pegs one at a time and rotate in-hand to place into resistance pegboard to facilitate NMR of LUE coordination and dexterity, FMC, and  hand strengthening; completed 20 w/ min difficulty and no drops, benefiting from extended time  Picking up 3-4 marbles one at a time, translating each to fingertips, and placing on easy-grip pegs to facilitate in-hand manipulation, [place], and intrinsic hand strengthening. Completed w/ multiple drops; increased success with decreased speed.  Using resistance clothespin (green, 4 lb) to retrieve marbles from easy-grip pegs and return back to container, facilitating palmar pinch/intrinsic hand strengthening and control/coordination. Pt completed 20 marbles w/ minimal difficulty/drops  Typing Practiced typing, problem-solving w/ pt to identify potentially beneficial compensatory strategies, including stabilizing L forearm and/or wrist on tabletop surface to decrease demand on hands for improved coordination.    PATIENT EDUCATION: Ongoing condition-specific education related to therapeutic interventions completed this session, as well as neuro reed, typical recovery patterns, and benefit of repetitive engagement in tasks at just right challenge level to facilitate motor learning  Person educated: Patient Education method: Explanation Education comprehension: verbalized understanding   HOME EXERCISE PROGRAM: Coordination activities reviewed and administered this session   GOALS: Goals reviewed with patient? Yes  SHORT TERM GOALS: Target date: 05/30/22    Status:  1 Pt will verbalize understanding of adapted strategies and/or equipment PRN to increase safety and independence with ADLs and IADLs (e.g., laundry, housekeeping, clothing manipulative management)  Baseline: decreased knowledge of compensatory strategies/AE Progressing  2 Pt will improve unilateral GMC for improved participation in higher level IADLs as evidenced by increasing finger-nose-finger test score to at least 10 w/ LUE in 20 seconds Baseline: 8 in 20 sec Progressing    LONG TERM GOALS: Target date: 06/29/22    Status:  1 Pt  will demonstrate independence w/ complete HEP designed for NMR of LUE [place], [place], and coordination  Baseline: to be administered Progressing  2 Pt will improve efficiency during functional fine motor skills (e.g., buttons, ties/laces) by improving time to complete 9-HPT w/ LUE to at least 50 sec by discharge Baseline: Right: 21.8 sec; Left: 59.41 sec Progressing  3 Pt will report being able to complete housekeeping/cleaning or laundry tasks w/ less than moderate difficulty, incorporating compensatory strategies, including AE prn Baseline: moderate difficulty Progressing     ASSESSMENT:  CLINICAL IMPRESSION: Pt arrives for first treatment session following initial evaluation on 04/30/22. OT reviewed goals w/ pt who is agreeable to plan of care at this time. OT then focused session on NMR LUE control and coordination and providing corresponding education regarding benefit and goals of tasks. Pt demonstrated most difficulty w/ higher level FM skills (e.g., translation, rotation) w/ mild ataxia observed, particularly when aiming for keys during typing activity. OT recommended stabilizing LUE during various tasks to facilitate distal stability w/ pt able to return demonstration w/out difficulty. Will continue to address NMR of LUE control and coordination, altered sensation in order to maximize functional use of LUE at non-dominant level.   PERFORMANCE DEFICITS in functional skills including coordination, sensation, [place], [place], and UE functional use.  IMPAIRMENTS are limiting patient from ADLs, IADLs, work, and leisure.   COMORBIDITIES may have co-morbidities  that affects occupational performance. Patient will benefit from skilled OT to address above impairments and improve overall function.   PLAN: OT FREQUENCY: 2x/week  OT DURATION: 8 weeks  PLANNED INTERVENTIONS: self care/ADL training, therapeutic  exercise, therapeutic activity, neuromuscular re-education, manual therapy, functional mobility  training, splinting, electrical stimulation, ultrasound, moist heat, cryotherapy, patient/family education, energy conservation, and DME and/or AE instructions  RECOMMENDED OTHER SERVICES: None at this time  CONSULTED AND AGREED WITH PLAN OF CARE: Patient  PLAN FOR NEXT SESSION: Continue HEP for NMR of [place] and [place] and coordination (targeting, coordination activities)   Kathrine Cords, MSOT, OTR/L 05/07/2022, 4:19 PM

## 2022-05-10 ENCOUNTER — Ambulatory Visit: Payer: 59 | Admitting: Occupational Therapy

## 2022-05-10 ENCOUNTER — Ambulatory Visit: Payer: 59 | Admitting: Internal Medicine

## 2022-05-14 ENCOUNTER — Ambulatory Visit: Payer: 59 | Admitting: Occupational Therapy

## 2022-05-17 ENCOUNTER — Ambulatory Visit: Payer: 59 | Admitting: Occupational Therapy

## 2022-05-21 ENCOUNTER — Ambulatory Visit: Payer: 59 | Admitting: Occupational Therapy

## 2022-05-24 ENCOUNTER — Ambulatory Visit: Payer: 59 | Admitting: Occupational Therapy

## 2022-05-31 ENCOUNTER — Other Ambulatory Visit: Payer: Self-pay | Admitting: Internal Medicine

## 2022-05-31 ENCOUNTER — Encounter: Payer: Self-pay | Admitting: Internal Medicine

## 2022-05-31 DIAGNOSIS — I10 Essential (primary) hypertension: Secondary | ICD-10-CM

## 2022-05-31 MED ORDER — CARVEDILOL 6.25 MG PO TABS: 6.2500 mg | ORAL_TABLET | Freq: Two times a day (BID) | ORAL | 2 refills | Status: DC

## 2022-06-05 ENCOUNTER — Other Ambulatory Visit: Payer: Self-pay | Admitting: Internal Medicine

## 2022-06-05 DIAGNOSIS — Z8673 Personal history of transient ischemic attack (TIA), and cerebral infarction without residual deficits: Secondary | ICD-10-CM

## 2022-06-05 DIAGNOSIS — I10 Essential (primary) hypertension: Secondary | ICD-10-CM

## 2022-06-05 NOTE — Telephone Encounter (Signed)
Requested Prescriptions  Pending Prescriptions Disp Refills   amLODipine (NORVASC) 10 MG tablet [Pharmacy Med Name: AMLODIPINE BESYLATE 10 MG TAB] 30 tablet 2    Sig: TAKE 1 TABLET BY MOUTH EVERY DAY     Cardiovascular: Calcium Channel Blockers 2 Failed - 06/05/2022 12:12 AM      Failed - Last BP in normal range    BP Readings from Last 1 Encounters:  04/29/22 (!) 138/90         Passed - Last Heart Rate in normal range    Pulse Readings from Last 1 Encounters:  04/29/22 77         Passed - Valid encounter within last 6 months    Recent Outpatient Visits           1 month ago Essential hypertension   [place], Neoma Laming B, MD   1 year ago Essential hypertension   [place], Deborah B, MD   1 year ago Essential hypertension   [place], [place], RPH-CPP   1 year ago Hospital discharge follow-up   [place], Deborah B, MD   1 year ago Essential hypertension   [place], MD       Future Appointments             In 2 months Ladell Pier, MD [place]             clopidogrel (PLAVIX) 75 MG tablet [Pharmacy Med Name: CLOPIDOGREL 75 MG TABLET] 30 tablet 2    Sig: TAKE 1 [place]     Hematology: Antiplatelets - clopidogrel Failed - 06/05/2022 12:12 AM      Failed - Cr in normal range and within 360 days    Creat  Date Value Ref Range Status  08/21/2021 1.40 (H) 0.60 - 1.29 mg/dL Final   Creatinine, Ser  Date Value Ref Range Status  12/31/2021 1.47 (H) 0.61 - 1.24 mg/dL Final         Passed - HCT in normal range and within 180 days    HCT  Date Value Ref Range Status  12/31/2021 44.6 39.0 - 52.0 % Final   Hematocrit  Date Value Ref Range Status  01/26/2021 46.0 37.5 - 51.0 % Final          Passed - HGB in normal range and within 180 days    Hemoglobin  Date Value Ref Range Status  12/31/2021 14.9 13.0 - 17.0 g/dL Final  01/26/2021 15.5 13.0 - 17.7 g/dL Final         Passed - PLT in normal range and within 180 days    Platelets  Date Value Ref Range Status  12/31/2021 205 150 - 400 K/uL Final  01/26/2021 256 150 - 450 x10E3/uL Final         Passed - Valid encounter within last 6 months    Recent Outpatient Visits           1 month ago Essential hypertension   [place], Deborah B, MD   1 year ago Essential hypertension   [place], Deborah B, MD   1 year ago Essential hypertension   [place], RPH-CPP   1  year ago Hospital discharge follow-up   [place], MD   1 year ago Essential hypertension   [place], MD       Future Appointments             In 2 months Wynetta Emery Dalbert Batman, MD [place]

## 2022-07-25 ENCOUNTER — Other Ambulatory Visit: Payer: Self-pay | Admitting: Internal Medicine

## 2022-07-25 DIAGNOSIS — I10 Essential (primary) hypertension: Secondary | ICD-10-CM

## 2022-07-29 ENCOUNTER — Other Ambulatory Visit: Payer: Self-pay | Admitting: Internal Medicine

## 2022-08-13 ENCOUNTER — Ambulatory Visit: Payer: 59 | Admitting: Internal Medicine

## 2022-08-21 ENCOUNTER — Other Ambulatory Visit: Payer: Self-pay | Admitting: Internal Medicine

## 2022-08-21 ENCOUNTER — Encounter: Payer: Self-pay | Admitting: Internal Medicine

## 2022-08-21 DIAGNOSIS — Z8673 Personal history of transient ischemic attack (TIA), and cerebral infarction without residual deficits: Secondary | ICD-10-CM

## 2022-08-21 DIAGNOSIS — I10 Essential (primary) hypertension: Secondary | ICD-10-CM

## 2022-08-21 DIAGNOSIS — B20 Human immunodeficiency virus [HIV] disease: Secondary | ICD-10-CM

## 2022-08-21 MED ORDER — ATORVASTATIN CALCIUM 80 MG PO TABS: 80.0000 mg | ORAL_TABLET | Freq: Every day | ORAL | 1 refills | Status: DC

## 2022-08-21 MED ORDER — TRIUMEQ 600-50-300 MG PO TABS: 1.0000 | ORAL_TABLET | Freq: Every day | ORAL | 0 refills | Status: DC

## 2022-08-21 MED ORDER — CLOPIDOGREL BISULFATE 75 MG PO TABS: 75.0000 mg | ORAL_TABLET | Freq: Every day | ORAL | 1 refills | Status: DC

## 2022-08-21 MED ORDER — CARVEDILOL 6.25 MG PO TABS: 6.2500 mg | ORAL_TABLET | Freq: Two times a day (BID) | ORAL | 0 refills | Status: DC

## 2022-08-21 MED ORDER — AMLODIPINE BESYLATE 10 MG PO TABS: 10.0000 mg | ORAL_TABLET | Freq: Every day | ORAL | 0 refills | Status: DC

## 2022-08-22 NOTE — Telephone Encounter (Signed)
Requested medication (s) are due for refill today: na  Requested medication (s) are on the active medication list: yes  Last refill:  08/21/22 #30 1 refill  Future visit scheduled: yes in 2 months  Notes to clinic:  requesting 90 day supply . Do you want to refill for 90 days?     Requested Prescriptions  Pending Prescriptions Disp Refills   clopidogrel (PLAVIX) 75 MG tablet [Pharmacy Med Name: CLOPIDOGREL '75MG'$  TABLETS] 90 tablet     Sig: TAKE 1 TABLET(75 MG) BY MOUTH DAILY     Hematology: Antiplatelets - clopidogrel Failed - 08/21/2022 11:36 PM      Failed - HCT in normal range and within 180 days    HCT  Date Value Ref Range Status  12/31/2021 44.6 39.0 - 52.0 % Final   Hematocrit  Date Value Ref Range Status  01/26/2021 46.0 37.5 - 51.0 % Final         Failed - HGB in normal range and within 180 days    Hemoglobin  Date Value Ref Range Status  12/31/2021 14.9 13.0 - 17.0 g/dL Final  01/26/2021 15.5 13.0 - 17.7 g/dL Final         Failed - PLT in normal range and within 180 days    Platelets  Date Value Ref Range Status  12/31/2021 205 150 - 400 K/uL Final  01/26/2021 256 150 - 450 x10E3/uL Final         Failed - Cr in normal range and within 360 days    Creat  Date Value Ref Range Status  08/21/2021 1.40 (H) 0.60 - 1.29 mg/dL Final   Creatinine, Ser  Date Value Ref Range Status  12/31/2021 1.47 (H) 0.61 - 1.24 mg/dL Final         Passed - Valid encounter within last 6 months    Recent Outpatient Visits           4 months ago Essential hypertension   [place], Deborah B, MD   1 year ago Essential hypertension   [place], Deborah B, MD   1 year ago Essential hypertension   [place], Stephen L, RPH-CPP   1 year ago Hospital discharge follow-up   [place], Deborah B, MD    1 year ago Essential hypertension   [place], MD       Future Appointments             In 2 months Wynetta Emery, Dalbert Batman, MD [place]

## 2022-08-28 ENCOUNTER — Other Ambulatory Visit: Payer: Self-pay | Admitting: Internal Medicine

## 2022-08-28 DIAGNOSIS — Z8673 Personal history of transient ischemic attack (TIA), and cerebral infarction without residual deficits: Secondary | ICD-10-CM

## 2022-08-28 NOTE — Telephone Encounter (Signed)
Change of pharmacy Requested Prescriptions  Pending Prescriptions Disp Refills   clopidogrel (PLAVIX) 75 MG tablet [Pharmacy Med Name: CLOPIDOGREL 75 MG TABLET] 30 tablet 2    Sig: TAKE 1 TABLET BY MOUTH EVERY DAY     Hematology: Antiplatelets - clopidogrel Failed - 08/28/2022 12:10 AM      Failed - HCT in normal range and within 180 days    HCT  Date Value Ref Range Status  12/31/2021 44.6 39.0 - 52.0 % Final   Hematocrit  Date Value Ref Range Status  01/26/2021 46.0 37.5 - 51.0 % Final         Failed - HGB in normal range and within 180 days    Hemoglobin  Date Value Ref Range Status  12/31/2021 14.9 13.0 - 17.0 g/dL Final  01/26/2021 15.5 13.0 - 17.7 g/dL Final         Failed - PLT in normal range and within 180 days    Platelets  Date Value Ref Range Status  12/31/2021 205 150 - 400 K/uL Final  01/26/2021 256 150 - 450 x10E3/uL Final         Failed - Cr in normal range and within 360 days    Creat  Date Value Ref Range Status  08/21/2021 1.40 (H) 0.60 - 1.29 mg/dL Final   Creatinine, Ser  Date Value Ref Range Status  12/31/2021 1.47 (H) 0.61 - 1.24 mg/dL Final         Passed - Valid encounter within last 6 months    Recent Outpatient Visits           4 months ago Essential hypertension   [place], Deborah B, MD   1 year ago Essential hypertension   [place], Deborah B, MD   1 year ago Essential hypertension   [place], RPH-CPP   1 year ago Hospital discharge follow-up   [place], MD   1 year ago Essential hypertension   [place], MD       Future Appointments             In 1 month Wynetta Emery, Dalbert Batman, MD [place]

## 2022-09-25 ENCOUNTER — Emergency Department (HOSPITAL_BASED_OUTPATIENT_CLINIC_OR_DEPARTMENT_OTHER): Payer: BLUE CROSS/BLUE SHIELD

## 2022-09-25 ENCOUNTER — Emergency Department (HOSPITAL_BASED_OUTPATIENT_CLINIC_OR_DEPARTMENT_OTHER)
Admission: EM | Admit: 2022-09-25 | Discharge: 2022-09-25 | Disposition: A | Discharge status: Home / Self Care | Payer: BLUE CROSS/BLUE SHIELD | Attending: Emergency Medicine | Admitting: Emergency Medicine

## 2022-09-25 ENCOUNTER — Other Ambulatory Visit: Payer: Self-pay

## 2022-09-25 ENCOUNTER — Encounter (HOSPITAL_BASED_OUTPATIENT_CLINIC_OR_DEPARTMENT_OTHER): Payer: Self-pay | Admitting: Emergency Medicine

## 2022-09-25 DIAGNOSIS — Z91040 Latex allergy status: Secondary | ICD-10-CM | POA: Insufficient documentation

## 2022-09-25 DIAGNOSIS — N189 Chronic kidney disease, unspecified: Secondary | ICD-10-CM | POA: Diagnosis not present

## 2022-09-25 DIAGNOSIS — M25561 Pain in right knee: Secondary | ICD-10-CM | POA: Insufficient documentation

## 2022-09-25 DIAGNOSIS — Z21 Asymptomatic human immunodeficiency virus [HIV] infection status: Secondary | ICD-10-CM | POA: Diagnosis not present

## 2022-09-25 DIAGNOSIS — Z79899 Other long term (current) drug therapy: Secondary | ICD-10-CM | POA: Diagnosis not present

## 2022-09-25 DIAGNOSIS — Z7902 Long term (current) use of antithrombotics/antiplatelets: Secondary | ICD-10-CM | POA: Insufficient documentation

## 2022-09-25 DIAGNOSIS — Z85528 Personal history of other malignant neoplasm of kidney: Secondary | ICD-10-CM | POA: Diagnosis not present

## 2022-09-25 MED ORDER — ACETAMINOPHEN 500 MG PO TABS
1000.0000 mg | ORAL_TABLET | Freq: Once | ORAL | Status: AC
  Administered 2022-09-25: 1000 mg via ORAL
  Filled 2022-09-25: qty 2

## 2022-09-25 NOTE — ED Triage Notes (Signed)
Pt in with R anterior knee pain x 1 mo. States the pain has gotten worse through the day. Ambulatory into triage, took tylenol at 2pm today, and it did not relieve the pain. Denies any known injuries

## 2022-09-25 NOTE — ED Provider Notes (Signed)
[facility]  Provider Note  CSN: [id number] Arrival date & time: 09/25/22 0028  History Chief Complaint  Patient presents with   Knee Pain    Drew Cuevas is a 52 y.o. male with history of HIV on meds, prior renal cell carcinoma with CKD reports R knee pain worsening for about a month. Mostly anterior, worse with moving from sitting to standing. He is on his feet at work, does not do any floor work. Denies any falls or injuries. No fever or swelling. Has not seen his doctor for same.    Home Medications Prior to Admission medications   Medication Sig Start Date End Date Taking? Authorizing Provider  abacavir-dolutegravir-lamiVUDine (TRIUMEQ) 600-50-300 MG tablet Take 1 tablet by mouth daily. 08/21/22   Michel Bickers, MD  amLODipine (NORVASC) 10 MG tablet Take 1 tablet (10 mg total) by mouth daily. 08/21/22   Ladell Pier, MD  atorvastatin (LIPITOR) 80 MG tablet Take 1 tablet (80 mg total) by mouth daily. 08/21/22   Ladell Pier, MD  carvedilol (COREG) 6.25 MG tablet Take 1 tablet (6.25 mg total) by mouth 2 (two) times daily with a meal. 08/21/22   Ladell Pier, MD  clopidogrel (PLAVIX) 75 MG tablet TAKE 1 TABLET BY MOUTH EVERY DAY 08/28/22   Ladell Pier, MD  methocarbamol (ROBAXIN) 500 MG tablet TAKE 1 TABLET TWICE A DAY AS NEEDED FOR MUSCLE SPASMS 10/16/21   Ladell Pier, MD  Misc. Devices MISC 1 home blood pressure monitor 01/06/21   Ladell Pier, MD  ondansetron (ZOFRAN) 4 MG tablet Take 1 tablet (4 mg total) by mouth daily as needed for nausea or vomiting. 04/12/22   Ladell Pier, MD  ondansetron (ZOFRAN-ODT) 4 MG disintegrating tablet Take 1 tablet (4 mg total) by mouth daily. Prn for nausea or vomiting. 04/29/22   Nyoka Lint, PA-C  traZODone (DESYREL) 50 MG tablet TAKE 1/2 TABLET(25 MG) BY MOUTH AT BEDTIME 12/24/20   Ladell Pier, MD     Allergies    Ibuprofen, Penicillins, and  Latex   Review of Systems   Review of Systems Please see HPI for pertinent positives and negatives  Physical Exam BP 129/88 (BP Location: Right Arm)   Pulse 82   Temp (!) 96.7 F (35.9 C)   Resp 18   SpO2 99%   Physical Exam Vitals and nursing note reviewed.  HENT:     Head: Normocephalic.     Nose: Nose normal.  Eyes:     Extraocular Movements: Extraocular movements intact.  Pulmonary:     Effort: Pulmonary effort is normal.  Musculoskeletal:        General: Tenderness (anterior R knee) present. No swelling or deformity. Normal range of motion.     Cervical back: Neck supple.  Skin:    Findings: No rash (on exposed skin).  Neurological:     Mental Status: He is alert and oriented to person, place, and time.  Psychiatric:        Mood and Affect: Mood normal.     ED Results / Procedures / Treatments   EKG None  Procedures Procedures  Medications Ordered in the ED Medications  acetaminophen (TYLENOL) tablet 1,000 mg (has no administration in time range)    Initial Impression and Plan  Patient here with atraumatic knee pain, no signs of infection. I personally viewed the images from radiology studies and agree with radiologist interpretation: Xray is normal. Suspect bursitis, but  given his CKD will avoid NSAIDs. Plan knee brace, APAP for pain, rest and Ortho follow up.   ED Course       MDM Rules/Calculators/A&P Medical Decision Making Problems Addressed: Acute pain of right knee: acute illness or injury  Amount and/or Complexity of Data Reviewed Radiology: ordered and independent interpretation performed. Decision-making details documented in ED Course.  Risk OTC drugs.     Final Clinical Impression(s) / ED Diagnoses Final diagnoses:  Acute pain of right knee    Rx / DC Orders ED Discharge Orders     None        Truddie Hidden, MD 09/25/22 [phone]

## 2022-09-25 NOTE — ED Notes (Signed)
Patient verbalizes understanding of discharge instructions. Opportunity for questioning and answers were provided. Armband removed by staff, pt discharged from ED. Ambulated out to lobby  

## 2022-10-10 ENCOUNTER — Other Ambulatory Visit: Payer: BLUE CROSS/BLUE SHIELD

## 2022-10-10 ENCOUNTER — Other Ambulatory Visit: Payer: Self-pay

## 2022-10-10 DIAGNOSIS — Z79899 Other long term (current) drug therapy: Secondary | ICD-10-CM

## 2022-10-10 DIAGNOSIS — Z113 Encounter for screening for infections with a predominantly sexual mode of transmission: Secondary | ICD-10-CM

## 2022-10-10 DIAGNOSIS — B20 Human immunodeficiency virus [HIV] disease: Secondary | ICD-10-CM

## 2022-10-11 LAB — URINE CYTOLOGY ANCILLARY ONLY
Chlamydia: NEGATIVE
Comment: NEGATIVE
Comment: NORMAL
Neisseria Gonorrhea: NEGATIVE

## 2022-10-11 LAB — T-HELPER CELL (CD4) - (RCID CLINIC ONLY)
CD4 % Helper T Cell: 36 % (ref 33–65)
CD4 T Cell Abs: 1172 /uL (ref 400–1790)

## 2022-10-12 LAB — COMPLETE METABOLIC PANEL WITH GFR
AG Ratio: 1.5 (calc) (ref 1.0–2.5)
ALT: 44 U/L (ref 9–46)
AST: 20 U/L (ref 10–35)
Albumin: 4 g/dL (ref 3.6–5.1)
Alkaline phosphatase (APISO): 97 U/L (ref 35–144)
BUN/Creatinine Ratio: 5 (calc) — ABNORMAL LOW (ref 6–22)
BUN: 7 mg/dL (ref 7–25)
CO2: 27 mmol/L (ref 20–32)
Calcium: 9 mg/dL (ref 8.6–10.3)
Chloride: 108 mmol/L (ref 98–110)
Creat: 1.34 mg/dL — ABNORMAL HIGH (ref 0.70–1.30)
Globulin: 2.7 g/dL (calc) (ref 1.9–3.7)
Glucose, Bld: 87 mg/dL (ref 65–99)
Potassium: 4.2 mmol/L (ref 3.5–5.3)
Sodium: 142 mmol/L (ref 135–146)
Total Bilirubin: 0.4 mg/dL (ref 0.2–1.2)
Total Protein: 6.7 g/dL (ref 6.1–8.1)
eGFR: 65 mL/min/{1.73_m2} (ref >=60)

## 2022-10-12 LAB — LIPID PANEL
Cholesterol: 114 mg/dL (ref <200)
HDL: 31 mg/dL — ABNORMAL LOW (ref >=40)
LDL Cholesterol (Calc): 60 mg/dL (calc)
Non-HDL Cholesterol (Calc): 83 mg/dL (calc) (ref <130)
Total CHOL/HDL Ratio: 3.7 (calc) (ref <5.0)
Triglycerides: 156 mg/dL — ABNORMAL HIGH (ref <150)

## 2022-10-12 LAB — CBC WITH DIFFERENTIAL/PLATELET
Absolute Monocytes: 516 cells/uL (ref 200–950)
Basophils Absolute: 60 cells/uL (ref 0–200)
Basophils Relative: 0.9 %
Eosinophils Absolute: 201 cells/uL (ref 15–500)
Eosinophils Relative: 3 %
HCT: 43.2 % (ref 38.5–50.0)
Hemoglobin: 14.6 g/dL (ref 13.2–17.1)
Lymphs Abs: 3149 cells/uL (ref 850–3900)
MCH: 31.9 pg (ref 27.0–33.0)
MCHC: 33.8 g/dL (ref 32.0–36.0)
MCV: 94.5 fL (ref 80.0–100.0)
MPV: 11.6 fL (ref 7.5–12.5)
Monocytes Relative: 7.7 %
Neutro Abs: 2774 cells/uL (ref 1500–7800)
Neutrophils Relative %: 41.4 %
Platelets: 226 10*3/uL (ref 140–400)
RBC: 4.57 10*6/uL (ref 4.20–5.80)
RDW: 13.9 % (ref 11.0–15.0)
Total Lymphocyte: 47 %
WBC: 6.7 10*3/uL (ref 3.8–10.8)

## 2022-10-12 LAB — RPR: RPR Ser Ql: NONREACTIVE

## 2022-10-12 LAB — HIV-1 RNA QUANT-NO REFLEX-BLD
HIV 1 RNA Quant: NOT DETECTED Copies/mL
HIV-1 RNA Quant, Log: NOT DETECTED Log cps/mL

## 2022-10-23 ENCOUNTER — Other Ambulatory Visit: Payer: Self-pay | Admitting: Internal Medicine

## 2022-10-23 ENCOUNTER — Ambulatory Visit
Admission: RE | Admit: 2022-10-23 | Discharge: 2022-10-23 | Disposition: A | Discharge status: Home / Self Care | Payer: BLUE CROSS/BLUE SHIELD | Source: Ambulatory Visit | Attending: Internal Medicine | Admitting: Internal Medicine

## 2022-10-23 ENCOUNTER — Ambulatory Visit: Payer: BLUE CROSS/BLUE SHIELD | Attending: Internal Medicine | Admitting: Internal Medicine

## 2022-10-23 VITALS — BP 114/75 | HR 75 | Temp 98.2°F | Ht 66.0 in | Wt 238.0 lb

## 2022-10-23 DIAGNOSIS — E119 Type 2 diabetes mellitus without complications: Secondary | ICD-10-CM

## 2022-10-23 DIAGNOSIS — G8929 Other chronic pain: Secondary | ICD-10-CM

## 2022-10-23 DIAGNOSIS — Z23 Encounter for immunization: Secondary | ICD-10-CM

## 2022-10-23 DIAGNOSIS — I10 Essential (primary) hypertension: Secondary | ICD-10-CM | POA: Diagnosis not present

## 2022-10-23 DIAGNOSIS — M25552 Pain in left hip: Secondary | ICD-10-CM

## 2022-10-23 DIAGNOSIS — B20 Human immunodeficiency virus [HIV] disease: Secondary | ICD-10-CM

## 2022-10-23 DIAGNOSIS — I6930 Unspecified sequelae of cerebral infarction: Secondary | ICD-10-CM

## 2022-10-23 DIAGNOSIS — N182 Chronic kidney disease, stage 2 (mild): Secondary | ICD-10-CM

## 2022-10-23 DIAGNOSIS — C641 Malignant neoplasm of right kidney, except renal pelvis: Secondary | ICD-10-CM

## 2022-10-23 DIAGNOSIS — Q181 Preauricular sinus and cyst: Secondary | ICD-10-CM

## 2022-10-23 DIAGNOSIS — M25561 Pain in right knee: Secondary | ICD-10-CM

## 2022-10-23 DIAGNOSIS — Z6838 Body mass index (BMI) 38.0-38.9, adult: Secondary | ICD-10-CM

## 2022-10-23 DIAGNOSIS — M25512 Pain in left shoulder: Secondary | ICD-10-CM

## 2022-10-23 DIAGNOSIS — Z87891 Personal history of nicotine dependence: Secondary | ICD-10-CM

## 2022-10-23 LAB — POCT GLYCOSYLATED HEMOGLOBIN (HGB A1C): HbA1c, POC (controlled diabetic range): 5.6 % (ref 0.0–7.0)

## 2022-10-23 LAB — GLUCOSE, POCT (MANUAL RESULT ENTRY): POC Glucose: 101 mg/dL — AB (ref 70–99)

## 2022-10-23 MED ORDER — ACETAMINOPHEN 500 MG PO TABS: 500.0000 mg | ORAL_TABLET | Freq: Three times a day (TID) | ORAL | 1 refills | Status: DC | PRN

## 2022-10-23 NOTE — Telephone Encounter (Signed)
Pt is requesting the medication acetaminophen (TYLENOL) 500 MG tablet be transferred to the pharmacy below for pickup.   [facility] [id number] - [place], [place]  [address] [place] [address]  Phone: [phone] Fax: [phone]  Hours: Open 24 hours   Please advise.

## 2022-10-23 NOTE — Patient Instructions (Addendum)
Please call [facility] to schedule your colonoscopy.  The location and phone number: [facility] [address], [place] [address] PH# [phone]  Please go to [facility] imaging at [address] to have the x-rays done as ordered.  We have referred you to orthopedics.

## 2022-10-23 NOTE — Progress Notes (Signed)
Patient ID: Drew Cuevas, male    DOB: [birth year]  MRN: [id number]  CC: Hypertension (HTN f/u. Med refill. Neoma Laming received flu vax. Yes to shingles.)   Subjective: Drew Cuevas is a 52 y.o. male who presents for chronic ds management His concerns today include:  HTN, HL, CVA affecting LT side, Obesity, HIV,OA RT knee, rectal fistulectomy, Renal cell CA s/p partial nephrology, depression, CKD stage2-3, .past hx of DM cured   HTN:   Currently taking: see medication list.  He is on amlodipine 10 mg daily and carvedilol 6.25 mg twice a day.  Limits salt. No device to check BP  Hx CVA/HL:  taking Plavix and Lipitor  C/o pain RT shoulder to elbow and LT thigh to knee.  Constant.  Hurts to move shoulder jt especially reaching up and behind -feels like a pull, real sharp pain.  Started 1 yr after the stroke.  Pain in the right thigh he feels is in the hip joint. Pain in RT knee also x 6-7 mths. No known injury.  Seen ER 09/25/22 for same.  X-ray negative.  Hurts to sit and stand, or squat down.  Pain is constant.    Obesity/Cure DM:  BS 101/A1C 5.6 not going to gym due to pain described above Stopped smoking >6 mths ago.  Trying to eat healthy - stopped sodas and juice; eating smaller portions.   Wgh up 7 lbs  HIV: Followed by ID.  Recent HIV RNA level done 2 weeks ago was nondetectable.  Compliant with Triumeq.  Reports having small bump behind the left ear that appeared about 1 month ago.  It is sore to touch and itches.  History of renal cell CA.  He has had partial nephrectomy in the past.  No hematuria at this time.  No flank pain.  HM:  due for shingrix and c-scope.  Referred on last visit for c-scope.  missed call from GI and did not call back.  Due for urine microalbumin.  Last eye exam June 2023 at [facility]..   Patient Active Problem List   Diagnosis Date Noted   Class 2 severe obesity due to excess calories with serious comorbidity and body mass index (BMI) of 38.0 to  38.9 in adult [facility]) 04/12/2022   Recurrent boils 07/18/2021   Cerebellar stroke ([place]) 01/03/2021   COVID-19 virus infection 01/03/2021   Diverticulitis of large intestine with abscess 09/27/2020   Depression 06/19/2019   Other insomnia 06/19/2019   Chronic kidney disease, stage 3a ([place]) 04/15/2018   Tobacco dependence 01/13/2018   Primary osteoarthritis of right knee 04/03/2017   Dyslipidemia 06/14/2016   Renal cell carcinoma of right kidney ([place]) 08/19/2013   Renal cyst 05/21/2013   Obesity (BMI 30-39.9) 05/26/2012   Internal hemorrhoids 12/01/2008   Hypertension 04/28/2008   Major depressive disorder, single episode, severe ([place]) 12/20/2006   Human immunodeficiency virus (HIV) disease ([place]) 08/28/2006     Current Outpatient Medications on File Prior to Visit  Medication Sig Dispense Refill   abacavir-dolutegravir-lamiVUDine (TRIUMEQ) 600-50-300 MG tablet Take 1 tablet by mouth daily. 30 tablet 0   amLODipine (NORVASC) 10 MG tablet Take 1 tablet (10 mg total) by mouth daily. 90 tablet 0   atorvastatin (LIPITOR) 80 MG tablet Take 1 tablet (80 mg total) by mouth daily. 30 tablet 1   carvedilol (COREG) 6.25 MG tablet Take 1 tablet (6.25 mg total) by mouth 2 (two) times daily with a meal. 180 tablet 0   clopidogrel (PLAVIX)  75 MG tablet TAKE 1 TABLET BY MOUTH EVERY DAY 30 tablet 2   methocarbamol (ROBAXIN) 500 MG tablet TAKE 1 TABLET TWICE A DAY AS NEEDED FOR MUSCLE SPASMS 40 tablet 0   Misc. Devices MISC 1 home blood pressure monitor 1 Device 0   ondansetron (ZOFRAN) 4 MG tablet Take 1 tablet (4 mg total) by mouth daily as needed for nausea or vomiting. 12 tablet 1   ondansetron (ZOFRAN-ODT) 4 MG disintegrating tablet Take 1 tablet (4 mg total) by mouth daily. Prn for nausea or vomiting. 20 tablet 1   traZODone (DESYREL) 50 MG tablet TAKE 1/2 TABLET(25 MG) BY MOUTH AT BEDTIME 15 tablet 0   No current facility-administered medications on file prior to visit.    Allergies  Allergen  Reactions   Ibuprofen Anaphylaxis, Swelling and Other (See Comments)    Dehydration Swelling of the "moist areas" throat, mouth   Penicillins Hives    Has patient had a PCN reaction causing immediate rash, facial/tongue/throat swelling, SOB or lightheadedness with hypotension: yes Has patient had a PCN reaction causing severe rash involving mucus membranes or skin necrosis: no-- pt had hives Has patient had a PCN reaction that required hospitalization no Has patient had a PCN reaction occurring within the last 10 years: no If all of the above answers are "NO", then may proceed with Cephalosporin use.    Latex Rash    Social History   Socioeconomic History   Marital status: Single    Spouse name: Not on file   Number of children: Not on file   Years of education: Not on file   Highest education level: Associate degree: occupational, Hotel manager, or vocational program  Occupational History   Not on file  Tobacco Use   Smoking status: Every Day    Packs/day: .25    Types: Cigarettes   Smokeless tobacco: Never   Tobacco comments:    stopped May 2017  Vaping Use   Vaping Use: Never used  Substance and Sexual Activity   Alcohol use: No    Comment: stopped 10-12 years ago   Drug use: No   Sexual activity: Not Currently    Partners: Male    Comment: declined condoms - 6.22.22  Other Topics Concern   Not on file  Social History Narrative   Not on file   Social Determinants of Health   Financial Resource Strain: Medium Risk (10/23/2022)   Overall Financial Resource Strain (CARDIA)    Difficulty of Paying Living Expenses: Somewhat hard  Food Insecurity: Food Insecurity Present (10/23/2022)   Hunger Vital Sign    Worried About Running Out of Food in the Last Year: Sometimes true    Ran Out of Food in the Last Year: Never true  Transportation Needs: No Transportation Needs (10/23/2022)   PRAPARE - Hydrologist (Medical): No    Lack of Transportation  (Non-Medical): No  Physical Activity: Insufficiently Active (10/23/2022)   Exercise Vital Sign    Days of Exercise per Week: 3 days    Minutes of Exercise per Session: 30 min  Stress: Stress Concern Present (10/23/2022)   [place]    Feeling of Stress : To some extent  Social Connections: Moderately Integrated (10/23/2022)   Social Connection and Isolation Panel [NHANES]    Frequency of Communication with Friends and Family: Three times a week    Frequency of Social Gatherings with Friends and Family: Once a week  Attends Religious Services: 1 to 4 times per year    Active Member of Clubs or Organizations: No    Attends Music therapist: Not on file    Marital Status: Living with partner  Intimate Partner Violence: Not on file    Family History  Problem Relation Age of Onset   Hypertension Mother    Hypertension Sister    Hypertension Brother     Past Surgical History:  Procedure Laterality Date   NO PAST SURGERIES     ROBOT ASSISTED LAPAROSCOPIC NEPHRECTOMY Right 08/19/2013   Procedure: ROBOTIC ASSISTED LAPAROSCOPIC RIGHT PARTIAL NEPHRECTOMY;  Surgeon: Ardis Hughs, MD;  Location: [facility];  Service: Urology;  Laterality: Right;    ROS: Review of Systems Negative except as stated above  PHYSICAL EXAM: BP 114/75 (BP Location: Right Arm, Patient Position: Sitting, Cuff Size: Large)   Pulse 75   Temp 98.2 F (36.8 C) (Oral)   Ht 5\' 6"  (1.676 m)   Wt 238 lb (108 kg)   SpO2 98%   BMI 38.41 kg/m   Wt Readings from Last 3 Encounters:  10/23/22 238 lb (108 kg)  04/12/22 231 lb 4.8 oz (104.9 kg)  12/31/21 234 lb (106.1 kg)    Physical Exam   General appearance - alert, well appearing, middle-age obese African-American male and in no distress Mental status - normal mood, behavior, speech, dress, motor activity, and thought processes Neck - supple, no significant adenopathy Chest -  clear to auscultation, no wheezes, rales or rhonchi, symmetric air entry Heart - normal rate, regular rhythm, normal S1, S2, no murmurs, rubs, clicks or gallops Musculoskeletal -left shoulder: No point tenderness.  Mild discomfort with elevation posterior movement.  Drop arm test negative. Right knee: No point tenderness.  Good range of motion.  Left hip: Slow range of motion.  He walks with an antalgic gait. Extremities -no lower extremity edema. Skin: 0.5 cm soft movable cyst noted behind the left earlobe midportion.  Slightly sore to touch.  No erythema.     04/12/2022    3:26 PM 09/05/2021    3:18 PM 05/05/2021    4:19 PM  Depression screen PHQ 2/9  Decreased Interest 0 0 0  Down, Depressed, Hopeless 0 0 0  PHQ - 2 Score 0 0 0  Altered sleeping 2    Tired, decreased energy 0    Change in appetite 0    Feeling bad or failure about yourself  0    Trouble concentrating 0    Moving slowly or fidgety/restless 0    Suicidal thoughts 0    PHQ-9 Score 2        04/12/2022    3:27 PM 05/05/2021    4:19 PM 01/26/2021   10:53 AM 12/30/2020    3:38 PM  GAD 7 : Generalized Anxiety Score  Nervous, Anxious, on Edge 0 0 0 0  Control/stop worrying 0 0 0 0  Worry too much - different things 0 0 0 0  Trouble relaxing 0 0 0 0  Restless 0 0 0 0  Easily annoyed or irritable 0 0 0 0  Afraid - awful might happen 0 0 0 0  Total GAD 7 Score 0 0 0 0        Latest Ref Rng & Units 10/10/2022   10:12 AM 12/31/2021    8:03 AM 12/26/2021    8:43 PM  CMP  Glucose 65 - 99 mg/dL 87  100  81   BUN  7 - 25 mg/dL 7  15  16    Creatinine 0.70 - 1.30 mg/dL 1.34  1.47  1.38   Sodium 135 - 146 mmol/L 142  137  143   Potassium 3.5 - 5.3 mmol/L 4.2  4.5  3.7   Chloride 98 - 110 mmol/L 108  108  113   CO2 20 - 32 mmol/L 27  22  23    Calcium 8.6 - 10.3 mg/dL 9.0  8.9  9.0   Total Protein 6.1 - 8.1 g/dL 6.7  6.9  7.1   Total Bilirubin 0.2 - 1.2 mg/dL 0.4  0.8  0.5   Alkaline Phos 38 - 126 U/L  82  91   AST 10 - 35  U/L 20  32  20   ALT 9 - 46 U/L 44  40  30    Lipid Panel     Component Value Date/Time   CHOL 114 10/10/2022 1012   CHOL 201 (H) 01/13/2018 1653   TRIG 156 (H) 10/10/2022 1012   HDL 31 (L) 10/10/2022 1012   HDL 35 (L) 01/13/2018 1653   CHOLHDL 3.7 10/10/2022 1012   VLDL 35 01/04/2021 0116   LDLCALC 60 10/10/2022 1012    CBC    Component Value Date/Time   WBC 6.7 10/10/2022 1012   RBC 4.57 10/10/2022 1012   HGB 14.6 10/10/2022 1012   HGB 15.5 01/26/2021 1205   HCT 43.2 10/10/2022 1012   HCT 46.0 01/26/2021 1205   PLT 226 10/10/2022 1012   PLT 256 01/26/2021 1205   MCV 94.5 10/10/2022 1012   MCV 96 01/26/2021 1205   MCH 31.9 10/10/2022 1012   MCHC 33.8 10/10/2022 1012   RDW 13.9 10/10/2022 1012   RDW 13.6 01/26/2021 1205   LYMPHSABS 3,149 10/10/2022 1012   MONOABS 1.0 12/26/2021 2043   EOSABS 201 10/10/2022 1012   BASOSABS 60 10/10/2022 1012    ASSESSMENT AND PLAN: 1. Essential hypertension At goal.  Continue carvedilol and Norvasc.  2. History of CVA with residual deficit Continue Plavix and atorvastatin.  3. Class 2 severe obesity with serious comorbidity and body mass index (BMI) of 38.0 to 38.9 in adult, unspecified obesity type ([place]) Encouraged him to continue healthy eating habits.  He is agreeable to seeing a nutritionist for further dietary counseling. Will get him in with orthopedics for evaluation of his left hip, right knee and left shoulder so that he can get back to some form of exercise. - Amb ref to Medical Nutrition Therapy-MNT  4. Diabetes mellitus type 2, diet-controlled ([place]) Has been diet controlled for years.  Encouraged him to continue healthy eating habits. Fax sent to [facility] to get documentation of his eye exam which he states he had done last summer. - POCT glucose (manual entry) - POCT glycosylated hemoglobin (Hb A1C) - Microalbumin / creatinine urine ratio  5. Human immunodeficiency virus (HIV) disease ([place]) Stable and  undetectable on Triumeq.  Followed by ID.  6. Renal cell carcinoma of right kidney (HCC) No signs of recurrence  7. CKD (chronic kidney disease) stage 2, GFR 60-89 ml/min We will continue to monitor kidney function.  Most recent GFR had improved with level being 65.  8. Chronic left shoulder pain Tylenol as needed. - DG Shoulder Left; Future - Ambulatory referral to Orthopedics  9. Chronic pain of right knee Tylenol as needed.  Weight loss will help. - Ambulatory referral to Orthopedics  10. Chronic hip pain, left Weight loss will help. -  Ambulatory referral to Orthopedics  11. Cyst on ear - Ambulatory referral to Dermatology  12. Need for shingles vaccine Discussed Shingrix vaccine and possible side effects including injection site reaction and rare side effect of Gilliam Barr syndrome.  Patient agreeable to receiving first vaccine today.  8.  Former smoker Commended him on quitting.  Patient was given the opportunity to ask questions.  Patient verbalized understanding of the plan and was able to repeat key elements of the plan.   This documentation was completed using Radio producer.  Any transcriptional errors are unintentional.  No orders of the defined types were placed in this encounter.    Requested Prescriptions    No prescriptions requested or ordered in this encounter    No follow-ups on file.  Karle Plumber, MD, FACP

## 2022-10-23 NOTE — Telephone Encounter (Signed)
Resending to requested pharmacy as provider ordered.  Requested Prescriptions  Pending Prescriptions Disp Refills   acetaminophen (TYLENOL) 500 MG tablet 100 tablet 1    Sig: Take 1 tablet (500 mg total) by mouth every 8 (eight) hours as needed.     Over the counter: OTC - acetaminophen Failed - 10/23/2022 12:39 PM      Failed - Cr in normal range and within 360 days    Creat  Date Value Ref Range Status  10/10/2022 1.34 (H) 0.70 - 1.30 mg/dL Final         Passed - ALT in normal range and within 360 days    ALT  Date Value Ref Range Status  10/10/2022 44 9 - 46 U/L Final         Passed - AST in normal range and within 360 days    AST  Date Value Ref Range Status  10/10/2022 20 10 - 35 U/L Final         Passed - Valid encounter within last 12 months    Recent Outpatient Visits           Today Essential hypertension   [place], MD   6 months ago Essential hypertension   [place], Deborah B, MD   1 year ago Essential hypertension   [place], Deborah B, MD   1 year ago Essential hypertension   [place], RPH-CPP   1 year ago Hospital discharge follow-up   [place], MD       Future Appointments             Tomorrow Michel Bickers, [place] for Infectious Disease, [facility]   In 4 months Ladell Pier, MD [place]

## 2022-10-24 ENCOUNTER — Encounter: Payer: Self-pay | Admitting: Internal Medicine

## 2022-10-24 ENCOUNTER — Ambulatory Visit (INDEPENDENT_AMBULATORY_CARE_PROVIDER_SITE_OTHER): Payer: BLUE CROSS/BLUE SHIELD | Admitting: Internal Medicine

## 2022-10-24 ENCOUNTER — Other Ambulatory Visit: Payer: Self-pay

## 2022-10-24 ENCOUNTER — Other Ambulatory Visit: Payer: Self-pay | Admitting: Internal Medicine

## 2022-10-24 VITALS — BP 119/81 | HR 83 | Temp 98.2°F | Ht 66.0 in | Wt 241.0 lb

## 2022-10-24 DIAGNOSIS — B20 Human immunodeficiency virus [HIV] disease: Secondary | ICD-10-CM | POA: Diagnosis not present

## 2022-10-24 LAB — MICROALBUMIN / CREATININE URINE RATIO
Creatinine, Urine: 194 mg/dL
Microalb/Creat Ratio: 3 mg/g creat (ref 0–29)
Microalbumin, Urine: 6.5 ug/mL

## 2022-10-24 MED ORDER — TRIUMEQ 600-50-300 MG PO TABS: 1.0000 | ORAL_TABLET | Freq: Every day | ORAL | 11 refills | Status: DC

## 2022-10-24 NOTE — Progress Notes (Signed)
Patient Active Problem List   Diagnosis Date Noted   Recurrent boils 07/18/2021    Priority: High   Cerebellar stroke ([place]) 01/03/2021    Priority: High   COVID-19 virus infection 01/03/2021    Priority: High   Human immunodeficiency virus (HIV) disease ([place]) 08/28/2006    Priority: High   Renal cell carcinoma of right kidney ([place]) 08/19/2013    Priority: Medium    Hypertension 04/28/2008    Priority: Medium    Class 2 severe obesity due to excess calories with serious comorbidity and body mass index (BMI) of 38.0 to 38.9 in adult ([place]) 04/12/2022   Diverticulitis of large intestine with abscess 09/27/2020   Depression 06/19/2019   Other insomnia 06/19/2019   Chronic kidney disease, stage 3a ([place]) 04/15/2018   Tobacco dependence 01/13/2018   Primary osteoarthritis of right knee 04/03/2017   Dyslipidemia 06/14/2016   Renal cyst 05/21/2013   Obesity (BMI 30-39.9) 05/26/2012   Internal hemorrhoids 12/01/2008   Major depressive disorder, single episode, severe ([place]) 12/20/2006    Patient's Medications  New Prescriptions   No medications on file  Previous Medications   ACETAMINOPHEN (TYLENOL) 500 MG TABLET    Take 1 tablet (500 mg total) by mouth every 8 (eight) hours as needed.   AMLODIPINE (NORVASC) 10 MG TABLET    Take 1 tablet (10 mg total) by mouth daily.   ATORVASTATIN (LIPITOR) 80 MG TABLET    Take 1 tablet (80 mg total) by mouth daily.   CARVEDILOL (COREG) 6.25 MG TABLET    Take 1 tablet (6.25 mg total) by mouth 2 (two) times daily with a meal.   CLOPIDOGREL (PLAVIX) 75 MG TABLET    TAKE 1 TABLET BY MOUTH EVERY DAY   METHOCARBAMOL (ROBAXIN) 500 MG TABLET    TAKE 1 TABLET TWICE A DAY AS NEEDED FOR MUSCLE SPASMS   MISC. DEVICES MISC    1 home blood pressure monitor   ONDANSETRON (ZOFRAN) 4 MG TABLET    Take 1 tablet (4 mg total) by mouth daily as needed for nausea or vomiting.   ONDANSETRON (ZOFRAN-ODT) 4 MG DISINTEGRATING TABLET    Take 1 tablet (4 mg total)  by mouth daily. Prn for nausea or vomiting.   TRAZODONE (DESYREL) 50 MG TABLET    TAKE 1/2 TABLET(25 MG) BY MOUTH AT BEDTIME  Modified Medications   Modified Medication Previous Medication   ABACAVIR-DOLUTEGRAVIR-LAMIVUDINE (TRIUMEQ) 600-50-300 MG TABLET abacavir-dolutegravir-lamiVUDine (TRIUMEQ) 600-50-300 MG tablet      Take 1 tablet by mouth daily.    Take 1 tablet by mouth daily.  Discontinued Medications   No medications on file    Subjective: Drew Cuevas is in with his significant other, Marcello Moores, for his routine HIV follow-up visit.  He has not had any problems obtaining, taking or tolerating his Triumeq and does not recall missing doses.  He is feeling well.  Review of Systems: Review of Systems  Constitutional:  Negative for weight loss.  Psychiatric/Behavioral:  Negative for depression.     Past Medical History:  Diagnosis Date   Bronchitis    LAST FLARE UP WAS NEW YEARS 2015   Cancer [facility])    kidney   Diverticulitis    GERD (gastroesophageal reflux disease)    NO MEDS   HIV (human immunodeficiency virus infection) ([place])    UNDER CONTROL WITH MEDICATIONS   Hypertension    Immune deficiency disorder ([place])    Renal insufficiency 05/17/2015  Renal mass, right     Social History   Tobacco Use   Smoking status: Every Day    Packs/day: .25    Types: Cigarettes   Smokeless tobacco: Never   Tobacco comments:    stopped May 2017  Vaping Use   Vaping Use: Never used  Substance Use Topics   Alcohol use: No    Comment: stopped 10-12 years ago   Drug use: No    Family History  Problem Relation Age of Onset   Hypertension Mother    Hypertension Sister    Hypertension Brother       Health Maintenance  Topic Date Due   FOOT EXAM  Never done   OPHTHALMOLOGY EXAM  Never done   Diabetic kidney evaluation - Urine ACR  Never done   COLONOSCOPY (Pts 45-39yrs Insurance coverage will need to be confirmed)  Never done   COVID-19 Vaccine (4 - 2023-24 season) 03/30/2022    Zoster Vaccines- Shingrix (2 of 2) 12/18/2022   HEMOGLOBIN A1C  04/25/2023   Diabetic kidney evaluation - eGFR measurement  10/10/2023   DTaP/Tdap/Td (5 - Td or Tdap) 07/24/2027   INFLUENZA VACCINE  Completed   Hepatitis C Screening  Completed   HIV Screening  Completed   HPV VACCINES  Aged Out    Objective:  Vitals:   10/24/22 1046  BP: 119/81  Pulse: 83  Temp: 98.2 F (36.8 C)  TempSrc: Oral  SpO2: 96%  Weight: 241 lb (109.3 kg)  Height: 5\' 6"  (1.676 m)   Body mass index is 38.9 kg/m.  Physical Exam Constitutional:      Comments: His spirits are good.  Cardiovascular:     Rate and Rhythm: Normal rate.     Pulses: Normal pulses.  Psychiatric:        Mood and Affect: Mood normal.     Lab Results Lab Results  Component Value Date   WBC 6.7 10/10/2022   HGB 14.6 10/10/2022   HCT 43.2 10/10/2022   MCV 94.5 10/10/2022   PLT 226 10/10/2022    Lab Results  Component Value Date   CREATININE 1.34 (H) 10/10/2022   BUN 7 10/10/2022   NA 142 10/10/2022   K 4.2 10/10/2022   CL 108 10/10/2022   CO2 27 10/10/2022    Lab Results  Component Value Date   ALT 44 10/10/2022   AST 20 10/10/2022   ALKPHOS 82 12/31/2021   BILITOT 0.4 10/10/2022    Lab Results  Component Value Date   CHOL 114 10/10/2022   HDL 31 (L) 10/10/2022   LDLCALC 60 10/10/2022   TRIG 156 (H) 10/10/2022   CHOLHDL 3.7 10/10/2022   Lab Results  Component Value Date   LABRPR NON-REACTIVE 10/10/2022   RPRTITER 1:1 (H) 08/21/2021   HIV 1 RNA Quant (Copies/mL)  Date Value  10/10/2022 Not Detected  08/21/2021 Not Detected  01/18/2021 Not Detected   CD4 T Cell Abs (/uL)  Date Value  10/10/2022 1,172  08/21/2021 1,259  06/07/2020 934     Problem List Items Addressed This Visit       High   Human immunodeficiency virus (HIV) disease ([place]) - Primary    His infection has been under excellent, long-term control.  He will continue Triumeq and follow-up after lab work in 1 year.       Relevant Medications   abacavir-dolutegravir-lamiVUDine (TRIUMEQ) 600-50-300 MG tablet   Other Relevant Orders   CBC   T-helper cells (CD4) count (not at  [place])   Comprehensive metabolic panel   Lipid panel   RPR   HIV-1 RNA quant-no reflex-bld      Michel Bickers, MD [facility] for Infectious [place] [phone] pager   [phone] cell 10/24/2022, 11:08 AM

## 2022-10-24 NOTE — Assessment & Plan Note (Signed)
His infection has been under excellent, long-term control.  He will continue Triumeq and follow-up after lab work in 1 year. °

## 2022-11-05 ENCOUNTER — Ambulatory Visit: Payer: BLUE CROSS/BLUE SHIELD | Admitting: Physician Assistant

## 2022-11-05 ENCOUNTER — Encounter: Payer: Self-pay | Admitting: Internal Medicine

## 2022-11-05 ENCOUNTER — Other Ambulatory Visit: Payer: Self-pay | Admitting: Internal Medicine

## 2022-11-05 DIAGNOSIS — G8929 Other chronic pain: Secondary | ICD-10-CM

## 2022-11-17 ENCOUNTER — Other Ambulatory Visit: Payer: Self-pay | Admitting: Internal Medicine

## 2022-11-17 DIAGNOSIS — Z8673 Personal history of transient ischemic attack (TIA), and cerebral infarction without residual deficits: Secondary | ICD-10-CM

## 2022-11-17 DIAGNOSIS — I10 Essential (primary) hypertension: Secondary | ICD-10-CM

## 2022-11-19 NOTE — Telephone Encounter (Signed)
Requested Prescriptions  Pending Prescriptions Disp Refills   clopidogrel (PLAVIX) 75 MG tablet [Pharmacy Med Name: CLOPIDOGREL  TABLETS] 90 tablet 0    Sig: TAKE 1 TABLET(75 MG) BY MOUTH DAILY     Hematology: Antiplatelets - clopidogrel Failed - 11/17/2022 11:37 AM      Failed - Cr in normal range and within 360 days    Creat  Date Value Ref Range Status  10/10/2022 1.34 (H) 0.70 - 1.30 mg/dL Final         Passed - HCT in normal range and within 180 days    HCT  Date Value Ref Range Status  10/10/2022 43.2 38.5 - 50.0 % Final   Hematocrit  Date Value Ref Range Status  01/26/2021 46.0 37.5 - 51.0 % Final         Passed - HGB in normal range and within 180 days    Hemoglobin  Date Value Ref Range Status  10/10/2022 14.6 13.2 - 17.1 g/dL Final  40/98/1191 47.8 13.0 - 17.7 g/dL Final         Passed - PLT in normal range and within 180 days    Platelets  Date Value Ref Range Status  10/10/2022 226 140 - 400 Thousand/uL Final  01/26/2021 256 150 - 450 x10E3/uL Final         Passed - Valid encounter within last 6 months    Recent Outpatient Visits           3 weeks ago Essential hypertension   [place] [facility] Marcine Matar, MD   7 months ago Essential hypertension   [place] [facility] Jonah Blue B, MD   1 year ago Essential hypertension   [place] [facility] Jonah Blue B, MD   1 year ago Essential hypertension   [facility] [place], Cornelius Moras, RPH-CPP   1 year ago Hospital discharge follow-up   [facility] Marcine Matar, MD       Future Appointments             In 3 months Marcine Matar, MD [facility]             carvedilol (COREG) 6.25 MG tablet [Pharmacy Med Name: CARVEDILOL 6.25MG  TABLETS] 180 tablet 0    Sig: TAKE 1 TABLET(6.25 MG)  BY MOUTH TWICE DAILY WITH A MEAL     Cardiovascular: Beta Blockers 3 Failed - 11/17/2022 11:37 AM      Failed - Cr in normal range and within 360 days    Creat  Date Value Ref Range Status  10/10/2022 1.34 (H) 0.70 - 1.30 mg/dL Final         Passed - AST in normal range and within 360 days    AST  Date Value Ref Range Status  10/10/2022 20 10 - 35 U/L Final         Passed - ALT in normal range and within 360 days    ALT  Date Value Ref Range Status  10/10/2022 44 9 - 46 U/L Final         Passed - Last BP in normal range    BP Readings from Last 1 Encounters:  10/24/22 119/81         Passed - Last Heart Rate in normal range    Pulse Readings from Last 1  Encounters:  10/24/22 83         Passed - Valid encounter within last 6 months    Recent Outpatient Visits           3 weeks ago Essential hypertension   [place] [facility] Marcine Matar, MD   7 months ago Essential hypertension   [place] [facility] Marcine Matar, MD   1 year ago Essential hypertension   [place] [facility] Marcine Matar, MD   1 year ago Essential hypertension   [place] [facility] Drucilla Chalet, RPH-CPP   1 year ago Hospital discharge follow-up   [facility] Marcine Matar, MD       Future Appointments             In 3 months Marcine Matar, MD [facility]             amLODipine (NORVASC) 10 MG tablet [Pharmacy Med Name: AMLODIPINE BESYLATE  TABLETS] 90 tablet 0    Sig: TAKE 1 TABLET(10 MG) BY MOUTH DAILY     Cardiovascular: Calcium Channel Blockers 2 Passed - 11/17/2022 11:37 AM      Passed - Last BP in normal range    BP Readings from Last 1 Encounters:  10/24/22 119/81         Passed - Last Heart Rate in normal range    Pulse Readings from Last 1 Encounters:  10/24/22  83         Passed - Valid encounter within last 6 months    Recent Outpatient Visits           3 weeks ago Essential hypertension   [place] [facility] Marcine Matar, MD   7 months ago Essential hypertension   [place] [facility] Marcine Matar, MD   1 year ago Essential hypertension   [facility] Marcine Matar, MD   1 year ago Essential hypertension   [place] Drucilla Chalet, RPH-CPP   1 year ago Hospital discharge follow-up   [facility] Marcine Matar, MD       Future Appointments             In 3 months Laural Benes Binnie Rail, MD [facility]

## 2022-11-25 ENCOUNTER — Emergency Department (HOSPITAL_COMMUNITY)
Admission: EM | Admit: 2022-11-25 | Discharge: 2022-11-25 | Disposition: A | Discharge status: Home / Self Care | Payer: BLUE CROSS/BLUE SHIELD | Attending: Emergency Medicine | Admitting: Emergency Medicine

## 2022-11-25 ENCOUNTER — Other Ambulatory Visit: Payer: Self-pay

## 2022-11-25 ENCOUNTER — Encounter (HOSPITAL_COMMUNITY): Payer: Self-pay | Admitting: *Deleted

## 2022-11-25 DIAGNOSIS — L03317 Cellulitis of buttock: Secondary | ICD-10-CM | POA: Diagnosis present

## 2022-11-25 DIAGNOSIS — Z21 Asymptomatic human immunodeficiency virus [HIV] infection status: Secondary | ICD-10-CM | POA: Insufficient documentation

## 2022-11-25 DIAGNOSIS — I10 Essential (primary) hypertension: Secondary | ICD-10-CM | POA: Insufficient documentation

## 2022-11-25 MED ORDER — SULFAMETHOXAZOLE-TRIMETHOPRIM 800-160 MG PO TABS: 1.0000 | ORAL_TABLET | Freq: Two times a day (BID) | ORAL | 0 refills | Status: AC

## 2022-11-25 MED ORDER — SULFAMETHOXAZOLE-TRIMETHOPRIM 800-160 MG PO TABS
1.0000 | ORAL_TABLET | Freq: Once | ORAL | Status: AC
  Administered 2022-11-25: 1 via ORAL
  Filled 2022-11-25: qty 1

## 2022-11-25 NOTE — ED Triage Notes (Signed)
PT with abscess to buttock x 1 week.  No fevers.

## 2022-11-25 NOTE — Discharge Instructions (Addendum)
Drew Cuevas  Thank you for allowing [place] to take care of you today.  You came to the Emergency Department today because you have a history of abscesses on your buttocks and you feel like you may had another abscess on your buttocks today.  Fortunately on exam we are not seeing any evidence of a pocket of infection, we did not see any pocket of infection when we did an ultrasound of the painful area, the tissue does look inflamed, this is most consistent with a cellulitis which is an infection of the tissue rather than a abscess which is a pocket of infection, sometimes cellulitis can turn into abscesses, but this has not yet happened based on your exam.  We gave you a dose of antibiotics here in the emergency department, we will give you a prescription for antibiotics.  Since this is something that is recurrent for you, we are including the number of a surgeon, surgeons sometimes also can manage these recurrent abscesses, particularly if you tend to get the abscesses in the same place.  Should you have concern for recurrent abscesses in the same place, you could consider following up with the general surgeon.  We are giving you a prescription for Bactrim, an antibiotic, please take this twice a day for the next 7 days.  To-Do: 1. Please follow-up with your primary doctor within 2 days / as soon as possible.   Please return to the Emergency Department or call 911 if you experience have worsening of your symptoms, or do not get better, chest pain, shortness of breath, severe or significantly worsening pain, high fever, severe confusion, pass out or have any reason to think that you need emergency medical care.   We hope you feel better soon.   Curley Spice, MD Department of Emergency Medicine [facility]

## 2022-11-25 NOTE — ED Provider Notes (Signed)
[place] EMERGENCY DEPARTMENT AT [facility] Provider Note  Medical Decision Making   HPI: Drew Cuevas is a 52 y.o. male with history perinent for CVA, HTN, HIV adherent to ART obesity, prior pilonidal abscess who presents complaining of concern for pilonidal abscess. Patient arrived via POV.  History provided by patient.  No interpreter required for this encounter  Patient reports that he has a history of pilonidal abscesses, does not state that he has ever had to have drainage of abscesses by surgeon before, no history of Crohn's.  Reports that he has not missed a dose of his antiretroviral therapy in years.  Reports that he sometimes will have abscesses that resolved spontaneously, other times he will have medical management with antibiotics, and has also previously had I&D of pilonidal abscesses.  Reports that he felt that he had a pilonidal abscess on his gluteal cleft on the right buttocks beginning last week.  Reports that it spontaneously improved, however it has had worsening pain for the past 3 days, therefore he is concerned for an abscess.  Denies any fever, chills, nausea, vomiting, diarrhea, chest pain, shortness of breath, other systemic symptoms.  Patient does not take Bactrim prophylaxis for his HIV.  ROS: As per HPI. Please see MAR for complete past medical history, surgical history, and social history.   Physical exam is pertinent for warmth, induration, tenderness palpation and approximately 4 cm distribution at right gluteal cleft.   The differential includes but is not limited to abscess, cellulitis, fistula.  Additional history obtained from: None External records from outside source obtained and reviewed including: None  ED provider interpretation of ECG: Not indicated  ED provider interpretation of radiology/imaging: Performed bedside POCUS to assess for fluid collection, I do not appreciate organized fluid collection, I do appreciate some superficial  cobblestoning of the tissue  Labs ordered were interpreted by myself as well as my attending and were incorporated into the medical decision making process for this patient.  ED provider interpretation of labs: Not indicated  Interventions: Bactrim  See the EMR for full details regarding lab and imaging results.  Patient overall well-appearing on exam, given patient is adherent to his medications, doubt immunosuppression.  Patient is not systemically ill.  On exam there is no fluctuant area concerning for abscess, POCUS performed and is not reveal fluid collection consistent with abscess, does reveal tissue changes consistent with cellulitis.  No evidence of tracking fistula on exam, overall discussed these findings with patient, recommended medical management with Bactrim, first dose provided in the ED with paper prescription for remainder of course.  Provided telephone number for local general surgeon for evaluation should patient develop further of recurrent pilonidal abscesses.     Consults: Not indicated  Disposition: DISCHARGE: I believe that the patient is safe for discharge home with outpatient follow-up. Patient was informed of all pertinent physical exam, laboratory, and imaging findings. Patient's suspected etiology of their symptom presentation was discussed with the patient and all questions were answered. We discussed following up with PCP, surgery as needed. I provided thorough ED return precautions. The patient feels safe and comfortable with this plan.  The plan for this patient was discussed with Dr. Suezanne Jacquet, who voiced agreement and who oversaw evaluation and treatment of this patient.  Clinical Impression:  1. Cellulitis of buttock    Discharge  Therapies: These medications and interventions were provided for the patient while in the ED. Medications  sulfamethoxazole-trimethoprim (BACTRIM DS) 800-160 MG per tablet 1 tablet (1  tablet Oral Given 11/25/22 1644)    MDM  generated using voice dictation software and may contain dictation errors.  Please contact me for any clarification or with any questions.  Clinical Complexity A medically appropriate history, review of systems, and physical exam was performed.  Collateral history obtained from: None I personally reviewed the labs, EKG, imaging as discussed above. Patient's presentation is most consistent with acute, uncomplicated illness Considered and ruled out life and body threatening conditions  Treatment: Outpatient follow-up Medications: Prescription Discussed patient's care with providers from the following different specialties: None  Physical Exam   ED Triage Vitals  Enc Vitals Group     BP --      Pulse Rate 11/25/22 1558 81     Resp 11/25/22 1558 16     Temp 11/25/22 1558 98.1 F (36.7 C)     Temp Source 11/25/22 1558 Oral     SpO2 11/25/22 1601 98 %     Weight 11/25/22 1601 236 lb (107 kg)     Height 11/25/22 1601 5\' 6"  (1.676 m)     Head Circumference --      Peak Flow --      Pain Score 11/25/22 1601 10     Pain Loc --      Pain Edu? --      Excl. in GC? --      Physical Exam Vitals and nursing note reviewed. Exam conducted with a chaperone present.  Constitutional:      General: He is not in acute distress.    Appearance: He is well-developed.  HENT:     Head: Normocephalic and atraumatic.  Eyes:     Conjunctiva/sclera: Conjunctivae normal.  Cardiovascular:     Rate and Rhythm: Normal rate and regular rhythm.     Heart sounds: No murmur heard. Pulmonary:     Effort: Pulmonary effort is normal. No respiratory distress.     Breath sounds: Normal breath sounds.  Abdominal:     Palpations: Abdomen is soft.     Tenderness: There is no abdominal tenderness.  Genitourinary:      Comments: Approximately 4 cm area of tenderness, induration, mild calor Musculoskeletal:        General: No swelling.     Cervical back: Neck supple.  Skin:    General: Skin is warm and dry.      Capillary Refill: Capillary refill takes less than 2 seconds.  Neurological:     Mental Status: He is alert.  Psychiatric:        Mood and Affect: Mood normal.       Procedure Note  Procedures  No orders to display    Julianne Rice, MD Emergency Medicine, PGY-2   Curley Spice, MD 11/25/22 1919    Lonell Grandchild, MD 11/27/22 [phone]

## 2022-12-17 ENCOUNTER — Ambulatory Visit: Payer: BLUE CROSS/BLUE SHIELD | Admitting: Skilled Nursing Facility1

## 2023-01-15 ENCOUNTER — Other Ambulatory Visit: Payer: Self-pay

## 2023-01-15 ENCOUNTER — Emergency Department (HOSPITAL_BASED_OUTPATIENT_CLINIC_OR_DEPARTMENT_OTHER)
Admission: EM | Admit: 2023-01-15 | Discharge: 2023-01-15 | Disposition: A | Discharge status: Home / Self Care | Payer: BLUE CROSS/BLUE SHIELD | Attending: Emergency Medicine | Admitting: Emergency Medicine

## 2023-01-15 ENCOUNTER — Encounter (HOSPITAL_BASED_OUTPATIENT_CLINIC_OR_DEPARTMENT_OTHER): Payer: Self-pay

## 2023-01-15 DIAGNOSIS — L259 Unspecified contact dermatitis, unspecified cause: Secondary | ICD-10-CM | POA: Diagnosis not present

## 2023-01-15 DIAGNOSIS — R21 Rash and other nonspecific skin eruption: Secondary | ICD-10-CM | POA: Diagnosis present

## 2023-01-15 DIAGNOSIS — Z79899 Other long term (current) drug therapy: Secondary | ICD-10-CM | POA: Insufficient documentation

## 2023-01-15 MED ORDER — TRIAMCINOLONE ACETONIDE 0.1 % EX CREA: 1.0000 | TOPICAL_CREAM | Freq: Two times a day (BID) | CUTANEOUS | 0 refills | Status: AC

## 2023-01-15 NOTE — ED Notes (Signed)
Pt verbalized understanding of d/c instructions, meds, and followup care. Denies questions. VSS, no distress noted. Steady gait to exit with all belongings.  ?

## 2023-01-15 NOTE — ED Provider Notes (Signed)
[place] EMERGENCY DEPARTMENT AT [facility] Provider Note   CSN: [id number] Arrival date & time: 01/15/23  1932     History  Chief Complaint  Patient presents with  . Rash    Drew Cuevas is a 52 y.o. male.  He is here with a complaint of rash on his right forearm that is been going on for about 5 days.  He said it started with a few bumps down at his wrist and now is starting to extend up his forearm.  It is itchy.  He tried some topical alcohol to it and some hydrocortisone cream.  He does not think there was any type of bite.  No fevers.  No new medications or other contacts that he can think of.  Has not been doing any yard work.  Rash on arm  The history is provided by the patient.  Rash Location:  Shoulder/arm Shoulder/arm rash location:  R forearm Quality: itchiness and redness   Severity:  Moderate Onset quality:  Gradual Duration:  5 days Timing:  Constant Progression:  Worsening Chronicity:  New Relieved by:  Nothing Worsened by:  Nothing Ineffective treatments:  Topical steroids Associated symptoms: no fever, no nausea, no shortness of breath, no throat swelling, no tongue swelling and not vomiting        Home Medications Prior to Admission medications   Medication Sig Start Date End Date Taking? Authorizing Provider  abacavir-dolutegravir-lamiVUDine (TRIUMEQ) 600-50-300 MG tablet Take 1 tablet by mouth daily. 10/24/22   Cliffton Asters, MD  acetaminophen (TYLENOL) 500 MG tablet Take 1 tablet (500 mg total) by mouth every 8 (eight) hours as needed. 10/23/22   Marcine Matar, MD  amLODipine (NORVASC) 10 MG tablet TAKE 1 TABLET(10 MG) BY MOUTH DAILY 11/19/22   Marcine Matar, MD  atorvastatin (LIPITOR) 80 MG tablet TAKE 1 TABLET(80 MG) BY MOUTH DAILY 10/24/22   Marcine Matar, MD  carvedilol (COREG) 6.25 MG tablet TAKE 1 TABLET(6.25 MG) BY MOUTH TWICE DAILY WITH A MEAL 11/19/22   Marcine Matar, MD  clopidogrel (PLAVIX) 75 MG tablet TAKE 1  TABLET(75 MG) BY MOUTH DAILY 11/19/22   Marcine Matar, MD  methocarbamol (ROBAXIN) 500 MG tablet TAKE 1 TABLET TWICE A DAY AS NEEDED FOR MUSCLE SPASMS 10/16/21   Marcine Matar, MD  Misc. Devices MISC 1 home blood pressure monitor 01/06/21   Marcine Matar, MD  ondansetron (ZOFRAN) 4 MG tablet Take 1 tablet (4 mg total) by mouth daily as needed for nausea or vomiting. 04/12/22   Marcine Matar, MD  ondansetron (ZOFRAN-ODT) 4 MG disintegrating tablet Take 1 tablet (4 mg total) by mouth daily. Prn for nausea or vomiting. 04/29/22   Ellsworth Lennox, PA-C  traZODone (DESYREL) 50 MG tablet TAKE 1/2 TABLET(25 MG) BY MOUTH AT BEDTIME 12/24/20   Marcine Matar, MD      Allergies    Ibuprofen, Penicillins, and Latex    Review of Systems   Review of Systems  Constitutional:  Negative for fever.  HENT:  Negative for trouble swallowing.   Respiratory:  Negative for shortness of breath.   Cardiovascular:  Negative for chest pain.  Gastrointestinal:  Negative for nausea and vomiting.  Skin:  Positive for rash. Negative for wound.    Physical Exam Updated Vital Signs BP (!) 124/90 (BP Location: Right Arm)   Pulse 83   Temp (!) 97.3 F (36.3 C) (Temporal)   Resp 18   Ht 5\' 5"  (1.651 m)  Wt 106.6 kg   SpO2 100%   BMI 39.11 kg/m  Physical Exam Vitals and nursing note reviewed.  Constitutional:      Appearance: Normal appearance. He is well-developed.  HENT:     Head: Normocephalic and atraumatic.  Eyes:     Conjunctiva/sclera: Conjunctivae normal.  Pulmonary:     Effort: Pulmonary effort is normal.  Musculoskeletal:        General: No deformity. Normal range of motion.     Cervical back: Neck supple.  Skin:    General: Skin is warm and dry.     Findings: Rash present.     Comments: He has some small papules on his right forearm extending from his wrist up to about the mid forearm.  There is no significant erythema no weeping or drainage no warmth no open wounds.   Neurological:     General: No focal deficit present.     Mental Status: He is alert.     GCS: GCS eye subscore is 4. GCS verbal subscore is 5. GCS motor subscore is 6.    ED Results / Procedures / Treatments   Labs (all labs ordered are listed, but only abnormal results are displayed) Labs Reviewed - No data to display  EKG None  Radiology No results found.  Procedures Procedures    Medications Ordered in ED Medications - No data to display  ED Course/ Medical Decision Making/ A&P                             Medical Decision Making Risk Prescription drug management.   This patient complains of rash on arm; this involves an extensive number of treatment Options and is a complaint that carries with it a high risk of complications and morbidity. The differential includes contact dermatitis, rash, cellulitis  Previous records obtained and reviewed in epic no recent admissions Social determinants considered, patient with smoking, stress, food insecurity Critical Interventions: None  After the interventions stated above, I reevaluated the patient and found patient to be well-appearing in no distress Admission and further testing considered, no indications for admission or further workup at this time.  Will trial patient on stronger topical steroid and close follow-up with PCP.  Return instructions discussed          Final Clinical Impression(s) / ED Diagnoses Final diagnoses:  Contact dermatitis, unspecified contact dermatitis type, unspecified trigger    Rx / DC Orders ED Discharge Orders          Ordered    triamcinolone cream (KENALOG) 0.1 %  2 times daily        01/15/23 1947              Terrilee Files, MD 01/15/23 2135

## 2023-01-15 NOTE — ED Triage Notes (Signed)
Patient here POV from Home.  Notes Rash to Right Forearm that began a week ago. Worsened today. No Drainage. No Fevers. Some Discomfort.   NAD Noted during Triage. A&Ox4. Gcs 15. Ambulatory.

## 2023-01-15 NOTE — Discharge Instructions (Signed)
You are seen in the emergency department for rash on your forearm.  This is likely some sort of rash due to some exposure on your skin.  We are prescribing you a stronger topical steroid.  Use gentle soap and water.  Follow-up with your doctor.  Return to the emergency department if any worsening or concerning symptoms.

## 2023-01-16 ENCOUNTER — Ambulatory Visit (HOSPITAL_COMMUNITY): Payer: BLUE CROSS/BLUE SHIELD

## 2023-02-18 ENCOUNTER — Other Ambulatory Visit: Payer: Self-pay | Admitting: Internal Medicine

## 2023-02-18 DIAGNOSIS — I10 Essential (primary) hypertension: Secondary | ICD-10-CM

## 2023-02-19 NOTE — Telephone Encounter (Signed)
Requested Prescriptions  Pending Prescriptions Disp Refills   carvedilol (COREG) 6.25 MG tablet [Pharmacy Med Name: CARVEDILOL 6.25MG  TABLETS] 180 tablet 0    Sig: TAKE 1 TABLET(6.25 MG) BY MOUTH TWICE DAILY WITH A MEAL     Cardiovascular: Beta Blockers 3 Failed - 02/18/2023  8:43 AM      Failed - Cr in normal range and within 360 days    Creat  Date Value Ref Range Status  10/10/2022 1.34 (H) 0.70 - 1.30 mg/dL Final         Failed - Last BP in normal range    BP Readings from Last 1 Encounters:  01/15/23 (!) 124/90         Passed - AST in normal range and within 360 days    AST  Date Value Ref Range Status  10/10/2022 20 10 - 35 U/L Final         Passed - ALT in normal range and within 360 days    ALT  Date Value Ref Range Status  10/10/2022 44 9 - 46 U/L Final         Passed - Last Heart Rate in normal range    Pulse Readings from Last 1 Encounters:  01/15/23 83         Passed - Valid encounter within last 6 months    Recent Outpatient Visits           3 months ago Essential hypertension   [place] [facility] Marcine Matar, MD   10 months ago Essential hypertension   [place] [facility] Marcine Matar, MD   1 year ago Essential hypertension   [place] [facility] Marcine Matar, MD   2 years ago Essential hypertension   [place] [facility] [place], Cornelius Moras, RPH-CPP   2 years ago Hospital discharge follow-up   [facility] Marcine Matar, MD       Future Appointments             In 3 days Marcine Matar, MD [facility]             amLODipine (NORVASC) 10 MG tablet [Pharmacy Med Name: AMLODIPINE BESYLATE 10MG  TABLETS] 90 tablet 0    Sig: TAKE 1 TABLET(10 MG) BY MOUTH DAILY     Cardiovascular: Calcium Channel Blockers 2 Failed - 02/18/2023  8:43 AM       Failed - Last BP in normal range    BP Readings from Last 1 Encounters:  01/15/23 (!) 124/90         Passed - Last Heart Rate in normal range    Pulse Readings from Last 1 Encounters:  01/15/23 83         Passed - Valid encounter within last 6 months    Recent Outpatient Visits           3 months ago Essential hypertension   [place] [facility] Marcine Matar, MD   10 months ago Essential hypertension   [place] [facility] Marcine Matar, MD   1 year ago Essential hypertension    [facility] Marcine Matar, MD   2 years ago Essential hypertension   [facility] [place], Cornelius Moras, RPH-CPP  2 years ago Hospital discharge follow-up   [facility] Marcine Matar, MD       Future Appointments             In 3 days Marcine Matar, MD [facility]

## 2023-02-21 ENCOUNTER — Other Ambulatory Visit: Payer: Self-pay | Admitting: Internal Medicine

## 2023-02-21 DIAGNOSIS — I10 Essential (primary) hypertension: Secondary | ICD-10-CM

## 2023-02-21 NOTE — Telephone Encounter (Signed)
Requested by interface surescripts. Duplicate request. Receipt confirmed by pharmacy 02/19/23 at 12:28 pm.  Requested Prescriptions  Refused Prescriptions Disp Refills   amLODipine (NORVASC) 10 MG tablet [Pharmacy Med Name: AMLODIPINE BESYLATE 10MG  TABLETS] 90 tablet 0    Sig: TAKE 1 TABLET(10 MG) BY MOUTH DAILY     Cardiovascular: Calcium Channel Blockers 2 Failed - 02/21/2023  8:08 AM      Failed - Last BP in normal range    BP Readings from Last 1 Encounters:  01/15/23 (!) 124/90         Passed - Last Heart Rate in normal range    Pulse Readings from Last 1 Encounters:  01/15/23 83         Passed - Valid encounter within last 6 months    Recent Outpatient Visits           4 months ago Essential hypertension   [place] [facility] Marcine Matar, MD   10 months ago Essential hypertension   [place] [facility] Marcine Matar, MD   1 year ago Essential hypertension   [place] [facility] Jonah Blue B, MD   2 years ago Essential hypertension   [place] [facility] [place], Cornelius Moras, RPH-CPP   2 years ago Hospital discharge follow-up   [facility] Marcine Matar, MD       Future Appointments             Tomorrow Marcine Matar, MD [facility]             carvedilol (COREG) 6.25 MG tablet [Pharmacy Med Name: CARVEDILOL 6.25MG  TABLETS] 180 tablet 0    Sig: TAKE 1 TABLET(6.25 MG) BY MOUTH TWICE DAILY WITH A MEAL     Cardiovascular: Beta Blockers 3 Failed - 02/21/2023  8:08 AM      Failed - Cr in normal range and within 360 days    Creat  Date Value Ref Range Status  10/10/2022 1.34 (H) 0.70 - 1.30 mg/dL Final         Failed - Last BP in normal range    BP Readings from Last 1 Encounters:  01/15/23 (!) 124/90         Passed - AST in normal range and within 360 days     AST  Date Value Ref Range Status  10/10/2022 20 10 - 35 U/L Final         Passed - ALT in normal range and within 360 days    ALT  Date Value Ref Range Status  10/10/2022 44 9 - 46 U/L Final         Passed - Last Heart Rate in normal range    Pulse Readings from Last 1 Encounters:  01/15/23 83         Passed - Valid encounter within last 6 months    Recent Outpatient Visits           4 months ago Essential hypertension   [place] [facility] Marcine Matar, MD   10 months ago Essential hypertension   [place] [facility] Marcine Matar, MD   1 year ago Essential hypertension   [place] [facility] Marcine Matar, MD   2 years ago Essential hypertension   [facility] Drucilla Chalet, RPH-CPP   2 years ago Hospital discharge follow-up   [facility] Marcine Matar, MD       Future Appointments             Tomorrow Marcine Matar, MD [facility]

## 2023-02-22 ENCOUNTER — Ambulatory Visit: Payer: BLUE CROSS/BLUE SHIELD | Admitting: Internal Medicine

## 2023-02-22 ENCOUNTER — Other Ambulatory Visit: Payer: Self-pay

## 2023-02-22 ENCOUNTER — Emergency Department (HOSPITAL_BASED_OUTPATIENT_CLINIC_OR_DEPARTMENT_OTHER)
Admission: EM | Admit: 2023-02-22 | Discharge: 2023-02-22 | Disposition: A | Discharge status: Home / Self Care | Payer: BLUE CROSS/BLUE SHIELD | Attending: Emergency Medicine | Admitting: Emergency Medicine

## 2023-02-22 ENCOUNTER — Encounter (HOSPITAL_BASED_OUTPATIENT_CLINIC_OR_DEPARTMENT_OTHER): Payer: Self-pay

## 2023-02-22 DIAGNOSIS — Z91040 Latex allergy status: Secondary | ICD-10-CM | POA: Diagnosis not present

## 2023-02-22 DIAGNOSIS — M25531 Pain in right wrist: Secondary | ICD-10-CM | POA: Insufficient documentation

## 2023-02-22 NOTE — ED Notes (Signed)
Dr.Wickline informed that pt. Walked out. MD stated he was going to d/c pt. anyway.

## 2023-02-22 NOTE — ED Notes (Signed)
Pt. Was seen walking out of ED at this time. Waiting to see if pt returns

## 2023-02-22 NOTE — ED Notes (Signed)
Pt. Left prior to receiving discharge papers.

## 2023-02-22 NOTE — ED Triage Notes (Signed)
Right wrist pain, no known injury 9/10 started yesterday

## 2023-02-22 NOTE — ED Provider Notes (Signed)
[place] EMERGENCY DE[facility]MENT AT [facility] Provider Note   CSN: [id number] Arrival date & time: 02/22/23  0205     History  Chief Complaint  Patient presents with   Wrist Pain    Right wrist pain started yesterday no known injury    Manard Gailes is a 52 y.o. male.  The history is provided by the patient.  Wrist Pain This is a new problem. The current episode started 3 to 5 hours ago. The problem occurs constantly. The problem has not changed since onset.Nothing relieves the symptoms.  Patient reports atraumatic right wrist pain.  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  No trauma, no injuries to the wrist.  No swelling.  No history of gout     Home Medications Prior to Admission medications   Medication Sig Start Date End Date Taking? Authorizing Provider  abacavir-dolutegravir-lamiVUDine (TRIUMEQ) 600-50-300 MG tablet Take 1 tablet by mouth daily. 10/24/22   Cliffton Asters, MD  acetaminophen (TYLENOL) 500 MG tablet Take 1 tablet (500 mg total) by mouth every 8 (eight) hours as needed. 10/23/22   Marcine Matar, MD  amLODipine (NORVASC) 10 MG tablet TAKE 1 TABLET(10 MG) BY MOUTH DAILY 02/19/23   Marcine Matar, MD  atorvastatin (LIPITOR) 80 MG tablet TAKE 1 TABLET(80 MG) BY MOUTH DAILY 10/24/22   Marcine Matar, MD  carvedilol (COREG) 6.25 MG tablet TAKE 1 TABLET(6.25 MG) BY MOUTH TWICE DAILY WITH A MEAL 02/19/23   Marcine Matar, MD  clopidogrel (PLAVIX) 75 MG tablet TAKE 1 TABLET(75 MG) BY MOUTH DAILY 11/19/22   Marcine Matar, MD  methocarbamol (ROBAXIN) 500 MG tablet TAKE 1 TABLET TWICE A DAY AS NEEDED FOR MUSCLE SPASMS 10/16/21   Marcine Matar, MD  Misc. Devices MISC 1 home blood pressure monitor 01/06/21   Marcine Matar, MD  ondansetron (ZOFRAN) 4 MG tablet Take 1 tablet (4 mg total) by mouth daily as needed for nausea or vomiting. 04/12/22   Marcine Matar, MD  ondansetron (ZOFRAN-ODT) 4  MG disintegrating tablet Take 1 tablet (4 mg total) by mouth daily. Prn for nausea or vomiting. 04/29/22   Ellsworth Lennox, PA-C  traZODone (DESYREL) 50 MG tablet TAKE 1/2 TABLET(25 MG) BY MOUTH AT BEDTIME 12/24/20   Marcine Matar, MD  triamcinolone cream (KENALOG) 0.1 % Apply 1 Application topically 2 (two) times daily. 01/15/23   Terrilee Files, MD      Allergies    Ibuprofen, Penicillins, and Latex    Review of Systems   Review of Systems  Constitutional:  Negative for fever.  Musculoskeletal:  Positive for arthralgias. Negative for joint swelling.    Physical Exam Updated Vital Signs BP (!) 119/94 (BP Location: Left Arm)   Pulse 83   Temp 98 F (36.7 C) (Oral)   Resp 18   Ht 1.651 m (5\' 5" )   Wt 102.1 kg   SpO2 100%   BMI 37.44 kg/m  Physical Exam CONSTITUTIONAL: Well developed/well nourished HEAD: Normocephalic/atraumatic  EXTREMITIES: pulses normal/equal, full ROM Distal pulses equal and intact in upper extremities.  There is no tenderness to the right wrist.  There is no erythema, no swelling, no warmth.  No bruising or signs of trauma.  No crepitus. He can make a fist without difficulty.  He can flex and extend the right wrist without difficulty SKIN: warm, color normal PSYCH: no abnormalities of mood noted, alert and oriented to situation  ED Results / Procedures / Treatments   Labs (all labs ordered are listed, but only abnormal results are displayed) Labs Reviewed - No data to display  EKG None  Radiology No results found.  Procedures Procedures    Medications Ordered in ED Medications - No data to display  ED Course/ Medical Decision Making/ A&P                             Medical Decision Making  Patient presents with atraumatic wrist pain.  I offered x-ray but he declined.  Offered ibuprofen but he declined due to allergy His exam is overall unremarkable.  Advised follow with orthopedics.  Patient left before paperwork was  given        Final Clinical Impression(s) / ED Diagnoses Final diagnoses:  Right wrist pain    Rx / DC Orders ED Discharge Orders     None         Zadie Rhine, MD 02/22/23 [phone]

## 2023-04-08 ENCOUNTER — Other Ambulatory Visit: Payer: Self-pay | Admitting: Internal Medicine

## 2023-04-08 DIAGNOSIS — Z8673 Personal history of transient ischemic attack (TIA), and cerebral infarction without residual deficits: Secondary | ICD-10-CM

## 2023-06-18 ENCOUNTER — Ambulatory Visit: Payer: BLUE CROSS/BLUE SHIELD | Admitting: Internal Medicine

## 2023-07-03 ENCOUNTER — Other Ambulatory Visit: Payer: Self-pay | Admitting: Internal Medicine

## 2023-07-03 DIAGNOSIS — Z8673 Personal history of transient ischemic attack (TIA), and cerebral infarction without residual deficits: Secondary | ICD-10-CM

## 2023-08-13 ENCOUNTER — Other Ambulatory Visit (HOSPITAL_COMMUNITY): Payer: Self-pay

## 2023-08-26 ENCOUNTER — Encounter: Payer: Self-pay | Admitting: Internal Medicine

## 2023-08-26 ENCOUNTER — Telehealth: Payer: BLUE CROSS/BLUE SHIELD | Admitting: Internal Medicine

## 2023-08-26 ENCOUNTER — Encounter (HOSPITAL_BASED_OUTPATIENT_CLINIC_OR_DEPARTMENT_OTHER): Payer: BLUE CROSS/BLUE SHIELD | Admitting: Internal Medicine

## 2023-08-26 DIAGNOSIS — N182 Chronic kidney disease, stage 2 (mild): Secondary | ICD-10-CM

## 2023-08-26 DIAGNOSIS — L0232 Furuncle of buttock: Secondary | ICD-10-CM

## 2023-08-26 DIAGNOSIS — Z1211 Encounter for screening for malignant neoplasm of colon: Secondary | ICD-10-CM

## 2023-08-26 DIAGNOSIS — I6930 Unspecified sequelae of cerebral infarction: Secondary | ICD-10-CM

## 2023-08-26 DIAGNOSIS — I10 Essential (primary) hypertension: Secondary | ICD-10-CM

## 2023-08-26 DIAGNOSIS — I129 Hypertensive chronic kidney disease with stage 1 through stage 4 chronic kidney disease, or unspecified chronic kidney disease: Secondary | ICD-10-CM

## 2023-08-26 DIAGNOSIS — G4709 Other insomnia: Secondary | ICD-10-CM | POA: Diagnosis not present

## 2023-08-26 DIAGNOSIS — B20 Human immunodeficiency virus [HIV] disease: Secondary | ICD-10-CM

## 2023-08-26 MED ORDER — AMLODIPINE BESYLATE 10 MG PO TABS
10.0000 mg | ORAL_TABLET | Freq: Every day | ORAL | 1 refills | Status: AC
Start: 2023-08-26 — End: ?

## 2023-08-26 MED ORDER — CARVEDILOL 6.25 MG PO TABS
6.2500 mg | ORAL_TABLET | Freq: Two times a day (BID) | ORAL | 1 refills | Status: AC
Start: 2023-08-26 — End: ?

## 2023-08-26 MED ORDER — ATORVASTATIN CALCIUM 80 MG PO TABS
80.0000 mg | ORAL_TABLET | Freq: Every day | ORAL | 1 refills | Status: AC
Start: 2023-08-26 — End: ?

## 2023-08-26 MED ORDER — TRAZODONE HCL 50 MG PO TABS
ORAL_TABLET | ORAL | 0 refills | Status: DC
Start: 2023-08-26 — End: 2023-10-09

## 2023-08-26 MED ORDER — CLOPIDOGREL BISULFATE 75 MG PO TABS
75.0000 mg | ORAL_TABLET | Freq: Every day | ORAL | 1 refills | Status: AC
Start: 2023-08-26 — End: ?

## 2023-08-26 NOTE — Progress Notes (Signed)
Patient ID: Drew Cuevas, male   DOB: 08-Feb-1971, 52 y.o.   MRN: [id number] Virtual Visit via Video Note  I connected with Drew Cuevas on 08/26/2023 at 2:52 PM by a video enabled telemedicine application and verified that I am speaking with the correct person using two identifiers.  Location: Patient: home Provider: Office   I discussed the limitations of evaluation and management by telemedicine and the availability of in person appointments. The patient expressed understanding and agreed to proceed.  History of Present Illness: HTN, HL, CVA affecting LT side, Obesity, HIV,OA RT knee, rectal fistulectomy, Renal cell CA s/p partial nephrology, depression, CKD stage2-3, .past hx of DM cured  This is f/u for chronic ds management.  Pt request telehealth as he lack transportation to get here today  Discussed the use of AI scribe software for clinical note transcription with the patient, who gave verbal consent to proceed.  History of Present Illness   The patient, with a history of hypertension, stroke, and hyperlipidemia, presents for a routine follow-up. He reports adherence to his prescribed medications, including amlodipine 10mg , carvedilol 6.25mg  twice daily, Plavix, and atorvastatin. His blood pressure is checked weekly by a neighbor, with the most recent reading being 117/86.  He tells me that his diastolic blood pressure however usually runs 79-81.  He denies any chest pain or shortness of breath. He has successfully maintained smoking cessation for over a year.   He continues to take Triumeq as prescribed by infectious disease for HIV.  He has his next annual visit with ID in March.    We have been keeping an eye on his kidney function.  GFR has been in the 60s.  He is requesting refills on all of his medications including trazodone which he takes for insomnia.    HM: He received his flu shot and Pfizer COVID-19 vaccine in October at [facility]. He has no family history of colon cancer but  does have a family history of prostate cancer.  We discussed colon cancer screening.  He prefers to do Cologuard test.       Outpatient Encounter Medications as of 08/26/2023  Medication Sig   abacavir-dolutegravir-lamiVUDine (TRIUMEQ) 600-50-300 MG tablet Take 1 tablet by mouth daily.   acetaminophen (TYLENOL) 500 MG tablet Take 1 tablet (500 mg total) by mouth every 8 (eight) hours as needed.   amLODipine (NORVASC) 10 MG tablet TAKE 1 TABLET(10 MG) BY MOUTH DAILY   atorvastatin (LIPITOR) 80 MG tablet TAKE 1 TABLET(80 MG) BY MOUTH DAILY   carvedilol (COREG) 6.25 MG tablet TAKE 1 TABLET(6.25 MG) BY MOUTH TWICE DAILY WITH A MEAL   clopidogrel (PLAVIX) 75 MG tablet TAKE 1 TABLET(75 MG) BY MOUTH DAILY   methocarbamol (ROBAXIN) 500 MG tablet TAKE 1 TABLET TWICE A DAY AS NEEDED FOR MUSCLE SPASMS   Misc. Devices MISC 1 home blood pressure monitor   ondansetron (ZOFRAN) 4 MG tablet Take 1 tablet (4 mg total) by mouth daily as needed for nausea or vomiting.   ondansetron (ZOFRAN-ODT) 4 MG disintegrating tablet Take 1 tablet (4 mg total) by mouth daily. Prn for nausea or vomiting.   traZODone (DESYREL) 50 MG tablet TAKE 1/2 TABLET(25 MG) BY MOUTH AT BEDTIME   triamcinolone cream (KENALOG) 0.1 % Apply 1 Application topically 2 (two) times daily.   No facility-administered encounter medications on file as of 08/26/2023.      Observations/Objective: Middle age AAM sitting in chair in NAD  Assessment and Plan: 1. Essential hypertension (Primary) We will have  him continue amlodipine 10 mg daily and carvedilol 6.25 mg twice a day.  Continue to monitor blood pressure at least twice a week with goal being 130/80 or lower. - Comprehensive metabolic panel; Future - Lipid panel; Future - amLODipine (NORVASC) 10 MG tablet; Take 1 tablet (10 mg total) by mouth daily.  Dispense: 90 tablet; Refill: 1 - carvedilol (COREG) 6.25 MG tablet; Take 1 tablet (6.25 mg total) by mouth 2 (two) times daily with a meal.   Dispense: 180 tablet; Refill: 1  2. Other insomnia - traZODone (DESYREL) 50 MG tablet; TAKE 1/2 TABLET(25 MG) BY MOUTH AT BEDTIME  Dispense: 15 tablet; Refill: 0  3. History of CVA with residual deficit Continue atorvastatin and Plavix. - Lipid panel; Future - clopidogrel (PLAVIX) 75 MG tablet; Take 1 tablet (75 mg total) by mouth daily.  Dispense: 90 tablet; Refill: 1 - atorvastatin (LIPITOR) 80 MG tablet; Take 1 tablet (80 mg total) by mouth daily.  Dispense: 90 tablet; Refill: 1  4. CKD (chronic kidney disease) stage 2, GFR 60-89 ml/min Stable.  He will come to the lab when he has transportation to have his blood test done including chemistry.  5. Human immunodeficiency virus (HIV) disease (HCC) Continue his medication and keep upcoming appointment with ID  6. Screening for colon cancer Cologuard ordered.   Follow Up Instructions: 4 mths   I discussed the assessment and treatment plan with the patient. The patient was provided an opportunity to ask questions and all were answered. The patient agreed with the plan and demonstrated an understanding of the instructions.   The patient was advised to call back or seek an in-person evaluation if the symptoms worsen or if the condition fails to improve as anticipated.  I spent 17 minutes dedicated to the care of this patient on the date of this encounter to include previsit review of his chart including my last office note, face-to-face time with patient discussing diagnosis and management and post visit entering of orders.  This note has been created with Education officer, environmental. Any transcriptional errors are unintentional.  Jonah Blue, MD

## 2023-09-03 ENCOUNTER — Ambulatory Visit: Payer: Medicaid Other | Attending: Internal Medicine

## 2023-09-03 DIAGNOSIS — I6930 Unspecified sequelae of cerebral infarction: Secondary | ICD-10-CM

## 2023-09-03 DIAGNOSIS — I10 Essential (primary) hypertension: Secondary | ICD-10-CM | POA: Diagnosis not present

## 2023-09-04 ENCOUNTER — Encounter: Payer: Self-pay | Admitting: Internal Medicine

## 2023-09-04 LAB — COMPREHENSIVE METABOLIC PANEL
ALT: 26 [IU]/L (ref 0–44)
AST: 20 [IU]/L (ref 0–40)
Albumin: 4 g/dL (ref 3.8–4.9)
Alkaline Phosphatase: 102 [IU]/L (ref 44–121)
BUN/Creatinine Ratio: 7 — ABNORMAL LOW (ref 9–20)
BUN: 10 mg/dL (ref 6–24)
Bilirubin Total: <0.2 mg/dL (ref 0.0–1.2)
CO2: 22 mmol/L (ref 20–29)
Calcium: 9.4 mg/dL (ref 8.7–10.2)
Chloride: 104 mmol/L (ref 96–106)
Creatinine, Ser: 1.49 mg/dL — ABNORMAL HIGH (ref 0.76–1.27)
Globulin, Total: 2.4 g/dL (ref 1.5–4.5)
Glucose: 96 mg/dL (ref 70–99)
Potassium: 4.4 mmol/L (ref 3.5–5.2)
Sodium: 141 mmol/L (ref 134–144)
Total Protein: 6.4 g/dL (ref 6.0–8.5)
eGFR: 56 mL/min/{1.73_m2} — ABNORMAL LOW (ref >59)

## 2023-09-04 LAB — LIPID PANEL
Chol/HDL Ratio: 5.4 {ratio} — ABNORMAL HIGH (ref 0.0–5.0)
Cholesterol, Total: 205 mg/dL — ABNORMAL HIGH (ref 100–199)
HDL: 38 mg/dL — ABNORMAL LOW (ref >39)
LDL Chol Calc (NIH): 144 mg/dL — ABNORMAL HIGH (ref 0–99)
Triglycerides: 128 mg/dL (ref 0–149)
VLDL Cholesterol Cal: 23 mg/dL (ref 5–40)

## 2023-09-04 MED ORDER — SULFAMETHOXAZOLE-TRIMETHOPRIM 800-160 MG PO TABS: 1.0000 | ORAL_TABLET | Freq: Two times a day (BID) | ORAL | 0 refills | Status: DC

## 2023-09-04 NOTE — Telephone Encounter (Signed)
Please see the MyChart message reply(ies) for my assessment and plan.    This patient gave consent for this Medical Advice Message and is aware that it may result in a bill to their insurance company, as well as the possibility of receiving a bill for a co-payment or deductible. They are an established patient, but are not seeking medical advice exclusively about a problem treated during an in person or video visit in the last seven days. I did not recommend an in person or video visit within seven days of my reply.    I spent a total of 5 minutes cumulative time within 7 days through MyChart messaging.  Mariem Skolnick, MD   

## 2023-09-09 ENCOUNTER — Other Ambulatory Visit: Payer: Self-pay | Admitting: Internal Medicine

## 2023-09-09 MED ORDER — EZETIMIBE 10 MG PO TABS: 10.0000 mg | ORAL_TABLET | Freq: Every day | ORAL | 3 refills | Status: AC

## 2023-09-23 ENCOUNTER — Ambulatory Visit: Payer: BLUE CROSS/BLUE SHIELD | Admitting: Internal Medicine

## 2023-10-09 ENCOUNTER — Other Ambulatory Visit: Payer: Self-pay | Admitting: Internal Medicine

## 2023-10-09 DIAGNOSIS — G4709 Other insomnia: Secondary | ICD-10-CM

## 2023-10-21 ENCOUNTER — Ambulatory Visit: Payer: BLUE CROSS/BLUE SHIELD | Admitting: Infectious Disease

## 2023-10-29 ENCOUNTER — Other Ambulatory Visit: Payer: Self-pay

## 2023-10-29 DIAGNOSIS — B20 Human immunodeficiency virus [HIV] disease: Secondary | ICD-10-CM

## 2023-10-29 DIAGNOSIS — Z113 Encounter for screening for infections with a predominantly sexual mode of transmission: Secondary | ICD-10-CM

## 2023-11-04 ENCOUNTER — Other Ambulatory Visit (HOSPITAL_COMMUNITY)
Admission: RE | Admit: 2023-11-04 | Discharge: 2023-11-04 | Disposition: A | Discharge status: Home / Self Care | Source: Ambulatory Visit | Attending: Infectious Disease | Admitting: Infectious Disease

## 2023-11-04 ENCOUNTER — Other Ambulatory Visit: Payer: Self-pay

## 2023-11-04 ENCOUNTER — Other Ambulatory Visit

## 2023-11-04 DIAGNOSIS — Z113 Encounter for screening for infections with a predominantly sexual mode of transmission: Secondary | ICD-10-CM

## 2023-11-04 DIAGNOSIS — B20 Human immunodeficiency virus [HIV] disease: Secondary | ICD-10-CM

## 2023-11-05 LAB — URINE CYTOLOGY ANCILLARY ONLY
Chlamydia: NEGATIVE
Comment: NEGATIVE
Comment: NORMAL
Neisseria Gonorrhea: NEGATIVE

## 2023-11-05 LAB — T-HELPER CELL (CD4) - (RCID CLINIC ONLY)
CD4 % Helper T Cell: 35 % (ref 33–65)
CD4 T Cell Abs: 982 /uL (ref 400–1790)

## 2023-11-06 LAB — CBC WITH DIFFERENTIAL/PLATELET
Absolute Lymphocytes: 3016 {cells}/uL (ref 850–3900)
Absolute Monocytes: 656 {cells}/uL (ref 200–950)
Basophils Absolute: 56 {cells}/uL (ref 0–200)
Basophils Relative: 0.7 %
Eosinophils Absolute: 168 {cells}/uL (ref 15–500)
Eosinophils Relative: 2.1 %
HCT: 43 % (ref 38.5–50.0)
Hemoglobin: 14.6 g/dL (ref 13.2–17.1)
MCH: 32.1 pg (ref 27.0–33.0)
MCHC: 34 g/dL (ref 32.0–36.0)
MCV: 94.5 fL (ref 80.0–100.0)
MPV: 11.3 fL (ref 7.5–12.5)
Monocytes Relative: 8.2 %
Neutro Abs: 4104 {cells}/uL (ref 1500–7800)
Neutrophils Relative %: 51.3 %
Platelets: 249 10*3/uL (ref 140–400)
RBC: 4.55 10*6/uL (ref 4.20–5.80)
RDW: 13.8 % (ref 11.0–15.0)
Total Lymphocyte: 37.7 %
WBC: 8 10*3/uL (ref 3.8–10.8)

## 2023-11-06 LAB — RPR: RPR Ser Ql: NONREACTIVE

## 2023-11-06 LAB — HIV-1 RNA QUANT-NO REFLEX-BLD
HIV 1 RNA Quant: NOT DETECTED {copies}/mL
HIV-1 RNA Quant, Log: NOT DETECTED {Log_copies}/mL

## 2023-11-17 NOTE — Progress Notes (Deleted)
 Chief complaint: follow-up for HIV disease on medications  Subjective:    Patient ID: Drew Cuevas, male    DOB: 30-Jul-1971, 53 y.o.   MRN: [id number]  HPI   Past Medical History:  Diagnosis Date   Bronchitis    LAST FLARE UP WAS NEW YEARS 2015   Cancer [facility])    kidney   Diverticulitis    GERD (gastroesophageal reflux disease)    NO MEDS   HIV (human immunodeficiency virus infection) (HCC)    UNDER CONTROL WITH MEDICATIONS   Hypertension    Immune deficiency disorder (HCC)    Renal insufficiency 05/17/2015   Renal mass, right     Past Surgical History:  Procedure Laterality Date   NO PAST SURGERIES     ROBOT ASSISTED LAPAROSCOPIC NEPHRECTOMY Right 08/19/2013   Procedure: ROBOTIC ASSISTED LAPAROSCOPIC RIGHT PARTIAL NEPHRECTOMY;  Surgeon: Andrez Banker, MD;  Location: [facility];  Service: Urology;  Laterality: Right;    Family History  Problem Relation Age of Onset   Hypertension Mother    Hypertension Sister    Hypertension Brother       Social History   Socioeconomic History   Marital status: Single    Spouse name: Not on file   Number of children: Not on file   Years of education: Not on file   Highest education level: Associate degree: occupational, Scientist, product/process development, or vocational program  Occupational History   Not on file  Tobacco Use   Smoking status: Former    Current packs/day: 0.25    Types: Cigarettes   Smokeless tobacco: Never   Tobacco comments:    stopped May 2017  Vaping Use   Vaping status: Never Used  Substance and Sexual Activity   Alcohol use: No    Comment: stopped 10-12 years ago   Drug use: Yes    Frequency: 7.0 times per week    Types: Marijuana   Sexual activity: Not Currently    Partners: Male    Comment: declined condoms - 6.22.22  Other Topics Concern   Not on file  Social History Narrative   Not on file   Social Drivers of Health   Financial Resource Strain: Medium Risk (10/23/2022)   Overall Financial Resource Strain  (CARDIA)    Difficulty of Paying Living Expenses: Somewhat hard  Food Insecurity: Food Insecurity Present (10/23/2022)   Hunger Vital Sign    Worried About Running Out of Food in the Last Year: Sometimes true    Ran Out of Food in the Last Year: Never true  Transportation Needs: No Transportation Needs (10/23/2022)   PRAPARE - Administrator, Civil Service (Medical): No    Lack of Transportation (Non-Medical): No  Physical Activity: Insufficiently Active (10/23/2022)   Exercise Vital Sign    Days of Exercise per Week: 3 days    Minutes of Exercise per Session: 30 min  Stress: Stress Concern Present (10/23/2022)   [employer] of Occupational Health - Occupational Stress Questionnaire    Feeling of Stress : To some extent  Social Connections: Moderately Integrated (10/23/2022)   Social Connection and Isolation Panel [NHANES]    Frequency of Communication with Friends and Family: Three times a week    Frequency of Social Gatherings with Friends and Family: Once a week    Attends Religious Services: 1 to 4 times per year    Active Member of [employer] or Organizations: No    Attends Banker Meetings: Not on file  Marital Status: Living with partner    Allergies  Allergen Reactions   Ibuprofen Anaphylaxis, Swelling and Other (See Comments)    Dehydration Swelling of the "moist areas" (throat, mouth)   Penicillins Hives    Has patient had a PCN reaction causing immediate rash, facial/tongue/throat swelling, SOB or lightheadedness with hypotension: {yes} Has patient had a PCN reaction causing severe rash involving mucus membranes or skin necrosis: {no-- pt had hives} Has patient had a PCN reaction that required hospitalization {no} Has patient had a PCN reaction occurring within the last 10 years: {no} If all of the above answers are "NO", then may proceed with Cephalosporin use.    Latex Rash     Current Outpatient Medications:     abacavir -dolutegravir -lamiVUDine  (TRIUMEQ ) 600-50-300 MG tablet, Take 1 tablet by mouth daily., Disp: 30 tablet, Rfl: 11   acetaminophen  (TYLENOL ) 500 MG tablet, Take 1 tablet (500 mg total) by mouth every 8 (eight) hours as needed., Disp: 100 tablet, Rfl: 1   amLODipine  (NORVASC ) 10 MG tablet, Take 1 tablet (10 mg total) by mouth daily., Disp: 90 tablet, Rfl: 1   atorvastatin  (LIPITOR ) 80 MG tablet, Take 1 tablet (80 mg total) by mouth daily., Disp: 90 tablet, Rfl: 1   carvedilol  (COREG ) 6.25 MG tablet, Take 1 tablet (6.25 mg total) by mouth 2 (two) times daily with a meal., Disp: 180 tablet, Rfl: 1   clopidogrel  (PLAVIX ) 75 MG tablet, Take 1 tablet (75 mg total) by mouth daily., Disp: 90 tablet, Rfl: 1   ezetimibe  (ZETIA ) 10 MG tablet, Take 1 tablet (10 mg total) by mouth daily., Disp: 90 tablet, Rfl: 3   methocarbamol  (ROBAXIN ) 500 MG tablet, TAKE 1 TABLET TWICE A DAY AS NEEDED FOR MUSCLE SPASMS, Disp: 40 tablet, Rfl: 0   Misc. Devices MISC, 1 home blood pressure monitor, Disp: 1 Device, Rfl: 0   ondansetron  (ZOFRAN ) 4 MG tablet, Take 1 tablet (4 mg total) by mouth daily as needed for nausea or vomiting., Disp: 12 tablet, Rfl: 1   sulfamethoxazole -trimethoprim  (BACTRIM  DS) 800-160 MG tablet, Take 1 tablet by mouth 2 (two) times daily., Disp: 14 tablet, Rfl: 0   traZODone  (DESYREL ) 50 MG tablet, TAKE 1/2 TABLET(25 MG) BY MOUTH AT BEDTIME, Disp: 45 tablet, Rfl: 0   triamcinolone  cream (KENALOG ) 0.1 %, Apply 1 Application topically 2 (two) times daily., Disp: 30 g, Rfl: 0   Review of Systems     Objective:   Physical Exam        Assessment & Plan:

## 2023-11-18 ENCOUNTER — Other Ambulatory Visit: Payer: Self-pay

## 2023-11-18 ENCOUNTER — Ambulatory Visit: Admitting: Infectious Disease

## 2023-11-18 DIAGNOSIS — E785 Hyperlipidemia, unspecified: Secondary | ICD-10-CM

## 2023-11-18 DIAGNOSIS — B20 Human immunodeficiency virus [HIV] disease: Secondary | ICD-10-CM

## 2023-11-18 DIAGNOSIS — I10 Essential (primary) hypertension: Secondary | ICD-10-CM

## 2023-11-18 MED ORDER — TRIUMEQ 600-50-300 MG PO TABS: 1.0000 | ORAL_TABLET | Freq: Every day | ORAL | 0 refills | Status: DC

## 2023-11-21 ENCOUNTER — Other Ambulatory Visit: Payer: Self-pay

## 2023-11-21 ENCOUNTER — Encounter (HOSPITAL_COMMUNITY): Payer: Self-pay

## 2023-11-21 ENCOUNTER — Emergency Department (HOSPITAL_COMMUNITY)

## 2023-11-21 ENCOUNTER — Emergency Department (HOSPITAL_COMMUNITY)
Admission: EM | Admit: 2023-11-21 | Discharge: 2023-11-21 | Discharge status: Against Medical Advice | Attending: Emergency Medicine | Admitting: Emergency Medicine

## 2023-11-21 DIAGNOSIS — R202 Paresthesia of skin: Secondary | ICD-10-CM | POA: Insufficient documentation

## 2023-11-21 DIAGNOSIS — Z5321 Procedure and treatment not carried out due to patient leaving prior to being seen by health care provider: Secondary | ICD-10-CM | POA: Insufficient documentation

## 2023-11-21 DIAGNOSIS — Z8673 Personal history of transient ischemic attack (TIA), and cerebral infarction without residual deficits: Secondary | ICD-10-CM | POA: Diagnosis not present

## 2023-11-21 DIAGNOSIS — Z21 Asymptomatic human immunodeficiency virus [HIV] infection status: Secondary | ICD-10-CM | POA: Diagnosis not present

## 2023-11-21 DIAGNOSIS — I129 Hypertensive chronic kidney disease with stage 1 through stage 4 chronic kidney disease, or unspecified chronic kidney disease: Secondary | ICD-10-CM | POA: Insufficient documentation

## 2023-11-21 DIAGNOSIS — M542 Cervicalgia: Secondary | ICD-10-CM | POA: Insufficient documentation

## 2023-11-21 DIAGNOSIS — N189 Chronic kidney disease, unspecified: Secondary | ICD-10-CM | POA: Insufficient documentation

## 2023-11-21 DIAGNOSIS — M79602 Pain in left arm: Secondary | ICD-10-CM | POA: Diagnosis not present

## 2023-11-21 DIAGNOSIS — R079 Chest pain, unspecified: Secondary | ICD-10-CM | POA: Diagnosis not present

## 2023-11-21 DIAGNOSIS — M79603 Pain in arm, unspecified: Secondary | ICD-10-CM | POA: Diagnosis not present

## 2023-11-21 LAB — BASIC METABOLIC PANEL WITH GFR
Anion gap: 10 (ref 5–15)
BUN: 9 mg/dL (ref 6–20)
CO2: 23 mmol/L (ref 22–32)
Calcium: 8.9 mg/dL (ref 8.9–10.3)
Chloride: 104 mmol/L (ref 98–111)
Creatinine, Ser: 1.41 mg/dL — ABNORMAL HIGH (ref 0.61–1.24)
GFR, Estimated: >60 mL/min (ref >60)
Glucose, Bld: 147 mg/dL — ABNORMAL HIGH (ref 70–99)
Potassium: 3.5 mmol/L (ref 3.5–5.1)
Sodium: 137 mmol/L (ref 135–145)

## 2023-11-21 LAB — CBC
HCT: 44.5 % (ref 39.0–52.0)
Hemoglobin: 15.1 g/dL (ref 13.0–17.0)
MCH: 32.6 pg (ref 26.0–34.0)
MCHC: 33.9 g/dL (ref 30.0–36.0)
MCV: 96.1 fL (ref 80.0–100.0)
Platelets: 247 10*3/uL (ref 150–400)
RBC: 4.63 MIL/uL (ref 4.22–5.81)
RDW: 14.1 % (ref 11.5–15.5)
WBC: 8.8 10*3/uL (ref 4.0–10.5)
nRBC: 0 % (ref 0.0–0.2)

## 2023-11-21 LAB — TROPONIN I (HIGH SENSITIVITY)
Troponin I (High Sensitivity): 7 ng/L (ref <18)
Troponin I (High Sensitivity): 5 ng/L (ref <18)

## 2023-11-21 NOTE — ED Provider Triage Note (Signed)
 Emergency Medicine Provider Triage Evaluation Note  Drew Cuevas , a 53 y.o. male  was evaluated in triage.  Pt complains of arm pain. Report pain to L side of neck radiates down to L arm and sometimes chest ongoing for several months.  Pain worsen with certain movement, occasional tingling sensation to L hand.  No treatment tried.  Hx of HTN, HIV, CKD.  No headache. Hx of prior stroke  Review of Systems  Positive: As above Negative: As above  Physical Exam  BP (!) 134/93 (BP Location: Right Arm)   Pulse 95   Temp 98.3 F (36.8 C)   Resp 18   Ht 5\' 5"  (1.651 m)   Wt 102.1 kg   SpO2 100%   BMI 37.44 kg/m  Gen:   Awake, no distress   Resp:  Normal effort  MSK:   Moves extremities without difficulty  Other:    Medical Decision Making  Medically screening exam initiated at 3:30 PM.  Appropriate orders placed.  Drew Cuevas was informed that the remainder of the evaluation will be completed by another provider, this initial triage assessment does not replace that evaluation, and the importance of remaining in the ED until their evaluation is complete.     Debbra Fairy, PA-C 11/21/23 1531

## 2023-11-21 NOTE — ED Notes (Signed)
 NA for vitals or registration

## 2023-11-21 NOTE — ED Triage Notes (Signed)
 Reports has a stroke several years ago and it affected his left side.  Reports having left neck pain that goes down to left elbow and worse with movement.

## 2023-11-21 NOTE — ED Notes (Signed)
 Pt not answering to multiple calls for vitals in the lobby

## 2023-12-18 ENCOUNTER — Encounter (HOSPITAL_COMMUNITY): Payer: Self-pay

## 2023-12-18 ENCOUNTER — Other Ambulatory Visit: Payer: Self-pay

## 2023-12-18 ENCOUNTER — Emergency Department (HOSPITAL_COMMUNITY)

## 2023-12-18 ENCOUNTER — Emergency Department (HOSPITAL_COMMUNITY)
Admission: EM | Admit: 2023-12-18 | Discharge: 2023-12-18 | Disposition: A | Discharge status: Home / Self Care | Attending: Emergency Medicine | Admitting: Emergency Medicine

## 2023-12-18 DIAGNOSIS — I10 Essential (primary) hypertension: Secondary | ICD-10-CM | POA: Diagnosis not present

## 2023-12-18 DIAGNOSIS — Z7902 Long term (current) use of antithrombotics/antiplatelets: Secondary | ICD-10-CM | POA: Insufficient documentation

## 2023-12-18 DIAGNOSIS — K5732 Diverticulitis of large intestine without perforation or abscess without bleeding: Secondary | ICD-10-CM | POA: Insufficient documentation

## 2023-12-18 DIAGNOSIS — Z8673 Personal history of transient ischemic attack (TIA), and cerebral infarction without residual deficits: Secondary | ICD-10-CM | POA: Diagnosis not present

## 2023-12-18 DIAGNOSIS — Z91040 Latex allergy status: Secondary | ICD-10-CM | POA: Insufficient documentation

## 2023-12-18 DIAGNOSIS — R1032 Left lower quadrant pain: Secondary | ICD-10-CM | POA: Diagnosis present

## 2023-12-18 DIAGNOSIS — Z79899 Other long term (current) drug therapy: Secondary | ICD-10-CM | POA: Diagnosis not present

## 2023-12-18 DIAGNOSIS — Z21 Asymptomatic human immunodeficiency virus [HIV] infection status: Secondary | ICD-10-CM | POA: Insufficient documentation

## 2023-12-18 DIAGNOSIS — R7989 Other specified abnormal findings of blood chemistry: Secondary | ICD-10-CM | POA: Insufficient documentation

## 2023-12-18 DIAGNOSIS — K5792 Diverticulitis of intestine, part unspecified, without perforation or abscess without bleeding: Secondary | ICD-10-CM

## 2023-12-18 DIAGNOSIS — R748 Abnormal levels of other serum enzymes: Secondary | ICD-10-CM | POA: Diagnosis not present

## 2023-12-18 LAB — COMPREHENSIVE METABOLIC PANEL WITH GFR
ALT: 38 U/L (ref 0–44)
AST: 21 U/L (ref 15–41)
Albumin: 3.5 g/dL (ref 3.5–5.0)
Alkaline Phosphatase: 101 U/L (ref 38–126)
Anion gap: 8 (ref 5–15)
BUN: 13 mg/dL (ref 6–20)
CO2: 20 mmol/L — ABNORMAL LOW (ref 22–32)
Calcium: 8.7 mg/dL — ABNORMAL LOW (ref 8.9–10.3)
Chloride: 106 mmol/L (ref 98–111)
Creatinine, Ser: 1.38 mg/dL — ABNORMAL HIGH (ref 0.61–1.24)
GFR, Estimated: >60 mL/min (ref >60)
Glucose, Bld: 78 mg/dL (ref 70–99)
Potassium: 3.9 mmol/L (ref 3.5–5.1)
Sodium: 134 mmol/L — ABNORMAL LOW (ref 135–145)
Total Bilirubin: 0.7 mg/dL (ref 0.0–1.2)
Total Protein: 7.2 g/dL (ref 6.5–8.1)

## 2023-12-18 LAB — LIPASE, BLOOD: Lipase: 66 U/L — ABNORMAL HIGH (ref 11–51)

## 2023-12-18 LAB — CBC
HCT: 43.6 % (ref 39.0–52.0)
Hemoglobin: 14.4 g/dL (ref 13.0–17.0)
MCH: 32.4 pg (ref 26.0–34.0)
MCHC: 33 g/dL (ref 30.0–36.0)
MCV: 98 fL (ref 80.0–100.0)
Platelets: 230 10*3/uL (ref 150–400)
RBC: 4.45 MIL/uL (ref 4.22–5.81)
RDW: 14.1 % (ref 11.5–15.5)
WBC: 9.8 10*3/uL (ref 4.0–10.5)
nRBC: 0 % (ref 0.0–0.2)

## 2023-12-18 LAB — URINALYSIS, ROUTINE W REFLEX MICROSCOPIC
Bilirubin Urine: NEGATIVE
Glucose, UA: NEGATIVE mg/dL
Hgb urine dipstick: NEGATIVE
Ketones, ur: NEGATIVE mg/dL
Leukocytes,Ua: NEGATIVE
Nitrite: NEGATIVE
Protein, ur: NEGATIVE mg/dL
Specific Gravity, Urine: 1.025 (ref 1.005–1.030)
pH: 5 (ref 5.0–8.0)

## 2023-12-18 LAB — CBG MONITORING, ED: Glucose-Capillary: 85 mg/dL (ref 70–99)

## 2023-12-18 MED ORDER — HYDROCODONE-ACETAMINOPHEN 5-325 MG PO TABS
2.0000 | ORAL_TABLET | Freq: Once | ORAL | Status: AC
  Administered 2023-12-18: 2 via ORAL
  Filled 2023-12-18: qty 2

## 2023-12-18 MED ORDER — IOHEXOL 300 MG/ML  SOLN: 100.0000 mL | Freq: Once | INTRAMUSCULAR | Status: DC | PRN

## 2023-12-18 MED ORDER — HYDROCODONE-ACETAMINOPHEN 5-325 MG PO TABS: 1.0000 | ORAL_TABLET | ORAL | 0 refills | Status: AC | PRN

## 2023-12-18 MED ORDER — CIPROFLOXACIN HCL 500 MG PO TABS: 500.0000 mg | ORAL_TABLET | Freq: Two times a day (BID) | ORAL | 0 refills | Status: DC

## 2023-12-18 MED ORDER — IOHEXOL 300 MG/ML  SOLN
100.0000 mL | Freq: Once | INTRAMUSCULAR | Status: AC | PRN
  Administered 2023-12-18: 100 mL via INTRAVENOUS

## 2023-12-18 MED ORDER — CIPROFLOXACIN HCL 500 MG PO TABS
500.0000 mg | ORAL_TABLET | Freq: Once | ORAL | Status: AC
  Administered 2023-12-18: 500 mg via ORAL
  Filled 2023-12-18: qty 1

## 2023-12-18 MED ORDER — METRONIDAZOLE 500 MG PO TABS: 500.0000 mg | ORAL_TABLET | Freq: Two times a day (BID) | ORAL | 0 refills | Status: DC

## 2023-12-18 MED ORDER — METRONIDAZOLE 500 MG PO TABS
500.0000 mg | ORAL_TABLET | Freq: Once | ORAL | Status: AC
  Administered 2023-12-18: 500 mg via ORAL
  Filled 2023-12-18: qty 1

## 2023-12-18 NOTE — ED Provider Triage Note (Signed)
 Emergency Medicine Provider Triage Evaluation Note  Jhamal Plucinski , a 53 y.o. male  was evaluated in triage.  Pt complains of LLQ pain for past week with pain/difficulty with urination and bowel movements.  Review of Systems  Positive: LLQ pain Negative: Fevers, N/V/D  Physical Exam  BP 122/79   Pulse 98   Temp 98.8 F (37.1 C) (Oral)   Resp 18   Ht 5\' 5"  (1.651 m)   Wt 95.3 kg   SpO2 99%   BMI 34.95 kg/m  Gen:   Awake, no distress   Resp:  Normal effort  MSK:   Moves extremities without difficulty  Other:    Medical Decision Making  Medically screening exam initiated at 7:37 PM.  Appropriate orders placed.  Rande Roylance was informed that the remainder of the evaluation will be completed by another provider, this initial triage assessment does not replace that evaluation, and the importance of remaining in the ED until their evaluation is complete.   Sonnie Dusky, PA-C 12/18/23 [phone]

## 2023-12-18 NOTE — ED Triage Notes (Signed)
 Lower abdominal pain for 1 week. No vomiting or diarrhea. Pt states he has a hx of diverticulitis and this feels the same.

## 2023-12-18 NOTE — Discharge Instructions (Addendum)
 Follow-up with your primary care doctor.  Your lipase was mildly elevated.  This should be rechecked by your doctor.  Return to the emergency room if you have any worsening symptoms.

## 2023-12-18 NOTE — ED Provider Notes (Signed)
 [place] EMERGENCY DEPARTMENT AT [facility] Provider Note   CSN: [id number] Arrival date & time: 12/18/23  1858     History  Chief Complaint  Patient presents with   Abdominal Pain    Drew Cuevas is a 53 y.o. male.  Patient is a 53 year old male with a history of HIV, well-controlled on medications, hypertension, cerebellar stroke who presents with abdominal pain.  He said it started few days ago.  Says left lower abdomen.  He denies any nausea or vomiting.  No fevers.  He says he has some increased pain with bowel movements but no bloody stools.  No rectal pain.  No urinary symptoms.  He has had a prior history of diverticulitis.       Home Medications Prior to Admission medications   Medication Sig Start Date End Date Taking? Authorizing Provider  ciprofloxacin  (CIPRO ) 500 MG tablet Take 1 tablet (500 mg total) by mouth every 12 (twelve) hours. 12/18/23  Yes Hershel Los, MD  HYDROcodone -acetaminophen  (NORCO/VICODIN) 5-325 MG tablet Take 1-2 tablets by mouth every 4 (four) hours as needed. 12/18/23  Yes Hershel Los, MD  metroNIDAZOLE  (FLAGYL ) 500 MG tablet Take 1 tablet (500 mg total) by mouth 2 (two) times daily. 12/18/23  Yes Hershel Los, MD  abacavir -dolutegravir -lamiVUDine  (TRIUMEQ ) 600-50-300 MG tablet Take 1 tablet by mouth daily. 11/18/23   Charolette Copier, MD  amLODipine  (NORVASC ) 10 MG tablet Take 1 tablet (10 mg total) by mouth daily. 08/26/23   Lawrance Presume, MD  atorvastatin  (LIPITOR ) 80 MG tablet Take 1 tablet (80 mg total) by mouth daily. 08/26/23   Lawrance Presume, MD  carvedilol  (COREG ) 6.25 MG tablet Take 1 tablet (6.25 mg total) by mouth 2 (two) times daily with a meal. 08/26/23   Lawrance Presume, MD  clopidogrel  (PLAVIX ) 75 MG tablet Take 1 tablet (75 mg total) by mouth daily. 08/26/23   Lawrance Presume, MD  ezetimibe  (ZETIA ) 10 MG tablet Take 1 tablet (10 mg total) by mouth daily. 09/09/23   Lawrance Presume, MD   methocarbamol  (ROBAXIN ) 500 MG tablet TAKE 1 TABLET TWICE A DAY AS NEEDED FOR MUSCLE SPASMS 10/16/21   Lawrance Presume, MD  Misc. Devices MISC 1 home blood pressure monitor 01/06/21   Lawrance Presume, MD  ondansetron  (ZOFRAN ) 4 MG tablet Take 1 tablet (4 mg total) by mouth daily as needed for nausea or vomiting. 04/12/22   Lawrance Presume, MD  traZODone  (DESYREL ) 50 MG tablet TAKE 1/2 TABLET(25 MG) BY MOUTH AT BEDTIME 10/09/23   Lawrance Presume, MD  triamcinolone  cream (KENALOG ) 0.1 % Apply 1 Application topically 2 (two) times daily. 01/15/23   Tonya Fredrickson, MD      Allergies    Ibuprofen, Penicillins, and Latex    Review of Systems   Review of Systems  Constitutional:  Negative for chills, diaphoresis, fatigue and fever.  HENT:  Negative for congestion, rhinorrhea and sneezing.   Eyes: Negative.   Respiratory:  Negative for cough, chest tightness and shortness of breath.   Cardiovascular:  Negative for chest pain and leg swelling.  Gastrointestinal:  Positive for abdominal pain. Negative for blood in stool, diarrhea, nausea and vomiting.  Genitourinary:  Negative for difficulty urinating, flank pain, frequency and hematuria.  Musculoskeletal:  Negative for arthralgias and back pain.  Skin:  Negative for rash.  Neurological:  Negative for dizziness, speech difficulty, weakness, numbness and headaches.    Physical Exam Updated Vital Signs BP  122/79   Pulse 98   Temp 98.8 F (37.1 C) (Oral)   Resp 18   Ht 5\' 5"  (1.651 m)   Wt 95.3 kg   SpO2 99%   BMI 34.95 kg/m  Physical Exam Constitutional:      Appearance: He is well-developed.  HENT:     Head: Normocephalic and atraumatic.  Eyes:     Pupils: Pupils are equal, round, and reactive to light.  Cardiovascular:     Rate and Rhythm: Normal rate and regular rhythm.     Heart sounds: Normal heart sounds.  Pulmonary:     Effort: Pulmonary effort is normal. No respiratory distress.     Breath sounds: Normal breath  sounds. No wheezing or rales.  Chest:     Chest wall: No tenderness.  Abdominal:     General: Bowel sounds are normal.     Palpations: Abdomen is soft.     Tenderness: There is abdominal tenderness in the left lower quadrant. There is no guarding or rebound.  Musculoskeletal:        General: Normal range of motion.     Cervical back: Normal range of motion and neck supple.  Lymphadenopathy:     Cervical: No cervical adenopathy.  Skin:    General: Skin is warm and dry.     Findings: No rash.  Neurological:     Mental Status: He is alert and oriented to person, place, and time.     ED Results / Procedures / Treatments   Labs (all labs ordered are listed, but only abnormal results are displayed) Labs Reviewed  LIPASE, BLOOD - Abnormal; Notable for the following components:      Result Value   Lipase 66 (*)    All other components within normal limits  COMPREHENSIVE METABOLIC PANEL WITH GFR - Abnormal; Notable for the following components:   Sodium 134 (*)    CO2 20 (*)    Creatinine, Ser 1.38 (*)    Calcium  8.7 (*)    All other components within normal limits  CBC  URINALYSIS, ROUTINE W REFLEX MICROSCOPIC  CBG MONITORING, ED    EKG None  Radiology CT ABDOMEN PELVIS W CONTRAST Result Date: 12/18/2023 CLINICAL DATA:  Left lower quadrant pain for 1 week, initial encounter EXAM: CT ABDOMEN AND PELVIS WITH CONTRAST TECHNIQUE: Multidetector CT imaging of the abdomen and pelvis was performed using the standard protocol following bolus administration of intravenous contrast. RADIATION DOSE REDUCTION: This exam was performed according to the departmental dose-optimization program which includes automated exposure control, adjustment of the mA and/or kV according to patient size and/or use of iterative reconstruction technique. CONTRAST:  OMNIPAQUE  IOHEXOL  300 MG/ML  SOLN COMPARISON:  12/31/2021 FINDINGS: Lower chest: No acute abnormality. Hepatobiliary: No focal liver  abnormality is seen. No gallstones, gallbladder wall thickening, or biliary dilatation. Pancreas: Unremarkable. No pancreatic ductal dilatation or surrounding inflammatory changes. Spleen: Normal in size without focal abnormality. Adrenals/Urinary Tract: Adrenal glands are within normal limits. Kidneys demonstrate a normal enhancement pattern bilaterally. No renal calculi or obstructive changes are seen. Simple cyst is noted in the left kidney. No further follow-up is recommended. The bladder is decompressed. Stomach/Bowel: Diverticular change of the colon is noted with wall thickening and pericolonic inflammatory change in the sigmoid colon consistent with diverticulitis. No perforation or abscess formation is seen at this time. Proximal colon appears within normal limits. The appendix is unremarkable. Small bowel and stomach are within normal limits. Vascular/Lymphatic: Aortic atherosclerosis. No enlarged abdominal  or pelvic lymph nodes. Reproductive: Prostate is unremarkable. Other: No abdominal wall hernia or abnormality. No abdominopelvic ascites. Musculoskeletal: No acute or significant osseous findings. IMPRESSION: Changes consistent with diverticulitis Electronically Signed   By: Violeta Grey M.D.   On: 12/18/2023 21:43    Procedures Procedures    Medications Ordered in ED Medications  ciprofloxacin  (CIPRO ) tablet 500 mg (has no administration in time range)  metroNIDAZOLE  (FLAGYL ) tablet 500 mg (has no administration in time range)  HYDROcodone -acetaminophen  (NORCO/VICODIN) 5-325 MG per tablet 2 tablet (has no administration in time range)  iohexol  (OMNIPAQUE ) 300 MG/ML solution 100 mL (100 mLs Intravenous Contrast Given 12/18/23 2115)    ED Course/ Medical Decision Making/ A&P                                 Medical Decision Making Problems Addressed: Diverticulitis: acute illness or injury  Amount and/or Complexity of Data Reviewed Independent Historian:     Details: Significant  other External Data Reviewed: notes. Labs: ordered. Decision-making details documented in ED Course. Radiology: ordered and independent interpretation performed. Decision-making details documented in ED Course.  Risk Prescription drug management. Decision regarding hospitalization.   Patient is a 53 year old who presents with left lower quadrant abdominal pain.  His labs are overall nonconcerning.  His creatinine is mildly elevated but on chart review, similar to prior values.  His lipase is mildly elevated but he does not have any tenderness to his upper abdomen.  CT scan shows evidence of diverticulitis.  There is no complicating features of abscess or perforation.  No other acute abnormalities.  He was started on antibiotics.  No indication for hospitalization.  He was discharged home in good condition.  He was given prescription for antibiotics and a short course of hydrocodone .  He was encouraged to follow-up with his primary care doctor for recheck.  He was advised to have his lipase followed as well.  Return precautions were given.  Final Clinical Impression(s) / ED Diagnoses Final diagnoses:  Diverticulitis    Rx / DC Orders ED Discharge Orders          Ordered    ciprofloxacin  (CIPRO ) 500 MG tablet  Every 12 hours        12/18/23 2222    metroNIDAZOLE  (FLAGYL ) 500 MG tablet  2 times daily        12/18/23 2222    HYDROcodone -acetaminophen  (NORCO/VICODIN) 5-325 MG tablet  Every 4 hours PRN        12/18/23 2222              Hershel Los, MD 12/18/23 2225

## 2023-12-24 ENCOUNTER — Ambulatory Visit: Payer: BLUE CROSS/BLUE SHIELD | Admitting: Internal Medicine

## 2023-12-27 ENCOUNTER — Other Ambulatory Visit: Payer: Self-pay | Admitting: Infectious Disease

## 2023-12-27 DIAGNOSIS — B20 Human immunodeficiency virus [HIV] disease: Secondary | ICD-10-CM

## 2024-01-16 ENCOUNTER — Encounter: Payer: Self-pay | Admitting: Internal Medicine

## 2024-01-16 ENCOUNTER — Telehealth: Admitting: Internal Medicine

## 2024-01-16 ENCOUNTER — Other Ambulatory Visit: Payer: Self-pay

## 2024-01-16 DIAGNOSIS — K5732 Diverticulitis of large intestine without perforation or abscess without bleeding: Secondary | ICD-10-CM

## 2024-01-16 DIAGNOSIS — B20 Human immunodeficiency virus [HIV] disease: Secondary | ICD-10-CM | POA: Diagnosis not present

## 2024-01-16 DIAGNOSIS — Z113 Encounter for screening for infections with a predominantly sexual mode of transmission: Secondary | ICD-10-CM

## 2024-01-16 DIAGNOSIS — I2585 Chronic coronary microvascular dysfunction: Secondary | ICD-10-CM

## 2024-01-16 MED ORDER — BICTEGRAVIR-EMTRICITAB-TENOFOV 50-200-25 MG PO TABS: 1.0000 | ORAL_TABLET | Freq: Every day | ORAL | 11 refills | Status: AC

## 2024-01-16 NOTE — Progress Notes (Signed)
 Patient Active Problem List   Diagnosis Date Noted   Class 2 severe obesity due to excess calories with serious comorbidity and body mass index (BMI) of 38.0 to 38.9 in adult [facility]) 04/12/2022   Recurrent boils 07/18/2021   Cerebellar stroke (HCC) 01/03/2021   COVID-19 virus infection 01/03/2021   Diverticulitis of large intestine with abscess 09/27/2020   Depression 06/19/2019   Other insomnia 06/19/2019   Chronic kidney disease, stage 3a (HCC) 04/15/2018   Tobacco dependence 01/13/2018   Primary osteoarthritis of right knee 04/03/2017   Dyslipidemia 06/14/2016   Renal cell carcinoma of right kidney (HCC) 08/19/2013   Renal cyst 05/21/2013   Obesity (BMI 30-39.9) 05/26/2012   Internal hemorrhoids 12/01/2008   Hypertension 04/28/2008   Major depressive disorder, single episode, severe (HCC) 12/20/2006   Human immunodeficiency virus (HIV) disease (HCC) 08/28/2006    Patient's Medications  New Prescriptions   No medications on file  Previous Medications   AMLODIPINE  (NORVASC ) 10 MG TABLET    Take 1 tablet (10 mg total) by mouth daily.   ATORVASTATIN  (LIPITOR ) 80 MG TABLET    Take 1 tablet (80 mg total) by mouth daily.   CARVEDILOL  (COREG ) 6.25 MG TABLET    Take 1 tablet (6.25 mg total) by mouth 2 (two) times daily with a meal.   CIPROFLOXACIN  (CIPRO ) 500 MG TABLET    Take 1 tablet (500 mg total) by mouth every 12 (twelve) hours.   CLOPIDOGREL  (PLAVIX ) 75 MG TABLET    Take 1 tablet (75 mg total) by mouth daily.   EZETIMIBE  (ZETIA ) 10 MG TABLET    Take 1 tablet (10 mg total) by mouth daily.   HYDROCODONE -ACETAMINOPHEN  (NORCO/VICODIN) 5-325 MG TABLET    Take 1-2 tablets by mouth every 4 (four) hours as needed.   METHOCARBAMOL  (ROBAXIN ) 500 MG TABLET    TAKE 1 TABLET TWICE A DAY AS NEEDED FOR MUSCLE SPASMS   METRONIDAZOLE  (FLAGYL ) 500 MG TABLET    Take 1 tablet (500 mg total) by mouth 2 (two) times daily.   MISC. DEVICES MISC    1 home blood pressure monitor   ONDANSETRON   (ZOFRAN ) 4 MG TABLET    Take 1 tablet (4 mg total) by mouth daily as needed for nausea or vomiting.   TRAZODONE  (DESYREL ) 50 MG TABLET    TAKE 1/2 TABLET(25 MG) BY MOUTH AT BEDTIME   TRIAMCINOLONE  CREAM (KENALOG ) 0.1 %    Apply 1 Application topically 2 (two) times daily.   TRIUMEQ  600-50-300 MG TABLET    TAKE 1 TABLET BY MOUTH DAILY  Modified Medications   No medications on file  Discontinued Medications   No medications on file    Subjective: Drew Cuevas is in with his significant other, Drew Cuevas, for his routine HIV follow-up visit.  He has not had any problems obtaining, taking or tolerating his Triumeq  and does not recall missing doses.  He is feeling well.   01/16/24 id clinic First visit with me Televisit Reviewed 10/2023 labs Hiv viral load ud; rpr and urine gc/chlam negative;  Review of Systems: Review of Systems  Constitutional:  Negative for weight loss.  Psychiatric/Behavioral:  Negative for depression.     Past Medical History:  Diagnosis Date   Bronchitis    LAST FLARE UP WAS NEW YEARS 2015   Cancer [facility])    kidney   Diverticulitis    GERD (gastroesophageal reflux disease)    NO MEDS   HIV (human immunodeficiency  virus infection) (HCC)    UNDER CONTROL WITH MEDICATIONS   Hypertension    Immune deficiency disorder (HCC)    Renal insufficiency 05/17/2015   Renal mass, right     Social History   Tobacco Use   Smoking status: Former    Current packs/day: 0.25    Types: Cigarettes   Smokeless tobacco: Never   Tobacco comments:    stopped May 2017  Vaping Use   Vaping status: Never Used  Substance Use Topics   Alcohol use: No    Comment: stopped 10-12 years ago   Drug use: Yes    Frequency: 7.0 times per week    Types: Marijuana    Family History  Problem Relation Age of Onset   Hypertension Mother    Hypertension Sister    Hypertension Brother       Health Maintenance  Topic Date Due   FOOT EXAM  Never done   OPHTHALMOLOGY EXAM  Never done    Colonoscopy  Never done   COVID-19 Vaccine (4 - 2024-25 season) 03/31/2023   HEMOGLOBIN A1C  04/25/2023   Pneumococcal Vaccine 4-58 Years old (4 of 4 - PCV20 or PCV21) 05/01/2023   Diabetic kidney evaluation - Urine ACR  10/23/2023   INFLUENZA VACCINE  02/28/2024   Diabetic kidney evaluation - eGFR measurement  12/17/2024   DTaP/Tdap/Td (5 - Td or Tdap) 07/24/2027   Hepatitis C Screening  Completed   HIV Screening  Completed   Zoster Vaccines- Shingrix   Completed   HPV VACCINES  Aged Out   Meningococcal B Vaccine  Aged Out    Objective:  There were no vitals filed for this visit.  There is no height or weight on file to calculate BMI.  PE: No distress Conversant Normal respiratory effort No rash  Lab Results Lab Results  Component Value Date   WBC 9.8 12/18/2023   HGB 14.4 12/18/2023   HCT 43.6 12/18/2023   MCV 98.0 12/18/2023   PLT 230 12/18/2023    Lab Results  Component Value Date   CREATININE 1.38 (H) 12/18/2023   BUN 13 12/18/2023   NA 134 (L) 12/18/2023   K 3.9 12/18/2023   CL 106 12/18/2023   CO2 20 (L) 12/18/2023    Lab Results  Component Value Date   ALT 38 12/18/2023   AST 21 12/18/2023   ALKPHOS 101 12/18/2023   BILITOT 0.7 12/18/2023    Lab Results  Component Value Date   CHOL 205 (H) 09/03/2023   HDL 38 (L) 09/03/2023   LDLCALC 144 (H) 09/03/2023   TRIG 128 09/03/2023   CHOLHDL 5.4 (H) 09/03/2023   Lab Results  Component Value Date   LABRPR NON-REACTIVE 11/04/2023   RPRTITER 1:1 (H) 08/21/2021   HIV 1 RNA Quant  Date Value  11/04/2023 NOT DETECTED copies/mL  10/10/2022 Not Detected Copies/mL  08/21/2021 Not Detected Copies/mL   CD4 T Cell Abs (/uL)  Date Value  11/04/2023 982  10/10/2022 1,172  08/21/2021 1,259   HIV: 11/04/23       ud      /       982 (35%)   Problem List Items Addressed This Visit   None   #hiv #elevated cv risk/reprieve trial -dx'ed 2008; msm risk; routine screening -cd4 nadir 381 -hx  oi -therapy Atripla started 2008 --> triumeq   01/16/24 no missed dose last 4 weeks. Switching to biktarvy in setting of heart disease/cv risk  -on atorva 80 mg already and will  continue -discussed u=u -encourage compliance -continue current HIV medication triumeq  then start biktarvy when receives  -labs reviewed -f/u in 1 year   #sigmoid diverticulitis 4 weeks ago seen in [facility] for abd pain presumed diverticulitis and given cipro  flagyl  which he finished Reviewed 12/18/23 ct Sigmoid diverticular disease with changes consistent with diverticulitis; no perforation/abscess   #[facility]: First visit with me reviewed 12/2023 Hx cva (cerebellar) Htn/hlp OA right knee Hx right kidney rcc - s/p  partial nephrectomy 2015; no chemo Ckd3 Hx tobacco use - quit 2025 Depression  #social Lives with husband who is also seropositive   #hcm -vaccination Utd except meningococcal booster -hepatitis Hep b serology last checked 2008 not immune; vaccine given 2009  Will repeat hep b serology on next visit -std screen Negative rpr urine gc/chlam 10/2023 -tb screening -cancer screening Defer to pcp for age appropriate cancer screening    I connected with  Drew Cuevas on 01/16/2024  I verified that I was speaking with the correct person using two identifiers. Due to the COVID-19 Pandemic, this service was provided via telemedicine using audio/visual media.   The patient was located at home. The provider was located in the office. The patient did consent to this visit and is aware of charges through their insurance as well as the limitations of evaluation and management by telemedicine. Other persons participating in this telemedicine service were none. Time spent on visit was greater than 40 minutes on media and in coordination of care   Jamesetta Mcbride, MD [facility] [phone] pager   [phone] cell 01/16/2024, 1:35 PM

## 2024-01-20 ENCOUNTER — Other Ambulatory Visit (HOSPITAL_COMMUNITY): Payer: Self-pay

## 2024-01-21 ENCOUNTER — Other Ambulatory Visit (HOSPITAL_COMMUNITY): Payer: Self-pay

## 2024-01-27 ENCOUNTER — Other Ambulatory Visit: Payer: Self-pay | Admitting: Infectious Disease

## 2024-01-27 DIAGNOSIS — B20 Human immunodeficiency virus [HIV] disease: Secondary | ICD-10-CM

## 2024-04-03 ENCOUNTER — Ambulatory Visit
Admission: EM | Admit: 2024-04-03 | Discharge: 2024-04-03 | Disposition: A | Discharge status: Home / Self Care | Attending: Family Medicine | Admitting: Family Medicine

## 2024-04-03 ENCOUNTER — Encounter: Payer: Self-pay | Admitting: Emergency Medicine

## 2024-04-03 ENCOUNTER — Telehealth: Payer: Self-pay | Admitting: Emergency Medicine

## 2024-04-03 DIAGNOSIS — K0889 Other specified disorders of teeth and supporting structures: Secondary | ICD-10-CM | POA: Diagnosis not present

## 2024-04-03 MED ORDER — CLINDAMYCIN HCL 300 MG PO CAPS: 300.0000 mg | ORAL_CAPSULE | Freq: Three times a day (TID) | ORAL | 0 refills | Status: DC

## 2024-04-03 MED ORDER — CLINDAMYCIN HCL 300 MG PO CAPS: 300.0000 mg | ORAL_CAPSULE | Freq: Three times a day (TID) | ORAL | 0 refills | Status: AC

## 2024-04-03 NOTE — Telephone Encounter (Signed)
 Pt requested medication be sent to different location. [facility] on [address]. No further action required.

## 2024-04-03 NOTE — ED Provider Notes (Signed)
 [facility]    CSN: [id number] Arrival date & time: 04/03/24  0803      History   Chief Complaint Chief Complaint  Patient presents with   Dental Pain    HPI Drew Cuevas is a 53 y.o. male.    Dental Pain  Patient is here for right posterior dental pain for a while, but  worse this week.  Right ear pain and head pain.  He used an otc filling which came out, then he replaced it and started with pain.  No fevers or chills.  Taking tylenol  for pain.  He does not have a dentist.        Past Medical History:  Diagnosis Date   Bronchitis    LAST FLARE UP WAS NEW YEARS 2015   Cancer (HCC)    kidney   Diverticulitis    GERD (gastroesophageal reflux disease)    NO MEDS   HIV (human immunodeficiency virus infection) (HCC)    UNDER CONTROL WITH MEDICATIONS   Hypertension    Immune deficiency disorder (HCC)    Renal insufficiency 05/17/2015   Renal mass, right     Patient Active Problem List   Diagnosis Date Noted   Class 2 severe obesity due to excess calories with serious comorbidity and body mass index (BMI) of 38.0 to 38.9 in adult (HCC) 04/12/2022   Recurrent boils 07/18/2021   Cerebellar stroke (HCC) 01/03/2021   COVID-19 virus infection 01/03/2021   Diverticulitis of large intestine with abscess 09/27/2020   Depression 06/19/2019   Other insomnia 06/19/2019   Chronic kidney disease, stage 3a (HCC) 04/15/2018   Tobacco dependence 01/13/2018   Primary osteoarthritis of right knee 04/03/2017   Dyslipidemia 06/14/2016   Renal cell carcinoma of right kidney (HCC) 08/19/2013   Renal cyst 05/21/2013   Obesity (BMI 30-39.9) 05/26/2012   Internal hemorrhoids 12/01/2008   Hypertension 04/28/2008   Major depressive disorder, single episode, severe (HCC) 12/20/2006   Human immunodeficiency virus (HIV) disease (HCC) 08/28/2006    Past Surgical History:  Procedure Laterality Date   NO PAST SURGERIES     ROBOT ASSISTED LAPAROSCOPIC NEPHRECTOMY  Right 08/19/2013   Procedure: ROBOTIC ASSISTED LAPAROSCOPIC RIGHT PARTIAL NEPHRECTOMY;  Surgeon: Morene LELON Salines, MD;  Location: [facility];  Service: Urology;  Laterality: Right;       Home Medications    Prior to Admission medications   Medication Sig Start Date End Date Taking? Authorizing Provider  amLODipine  (NORVASC ) 10 MG tablet Take 1 tablet (10 mg total) by mouth daily. 08/26/23   Vicci Barnie NOVAK, MD  atorvastatin  (LIPITOR ) 80 MG tablet Take 1 tablet (80 mg total) by mouth daily. 08/26/23   Vicci Barnie NOVAK, MD  bictegravir-emtricitabine-tenofovir  AF (BIKTARVY) 50-200-25 MG TABS tablet Take 1 tablet by mouth daily. 01/16/24   Vu, Constance DASEN, MD  carvedilol  (COREG ) 6.25 MG tablet Take 1 tablet (6.25 mg total) by mouth 2 (two) times daily with a meal. 08/26/23   Vicci Barnie NOVAK, MD  ciprofloxacin  (CIPRO ) 500 MG tablet Take 1 tablet (500 mg total) by mouth every 12 (twelve) hours. 12/18/23   Lenor Hollering, MD  clopidogrel  (PLAVIX ) 75 MG tablet Take 1 tablet (75 mg total) by mouth daily. 08/26/23   Vicci Barnie NOVAK, MD  ezetimibe  (ZETIA ) 10 MG tablet Take 1 tablet (10 mg total) by mouth daily. 09/09/23   Vicci Barnie NOVAK, MD  HYDROcodone -acetaminophen  (NORCO/VICODIN) 5-325 MG tablet Take 1-2 tablets by mouth every 4 (four) hours as needed. 12/18/23  Lenor Hollering, MD  methocarbamol  (ROBAXIN ) 500 MG tablet TAKE 1 TABLET TWICE A DAY AS NEEDED FOR MUSCLE SPASMS 10/16/21   Vicci Barnie NOVAK, MD  metroNIDAZOLE  (FLAGYL ) 500 MG tablet Take 1 tablet (500 mg total) by mouth 2 (two) times daily. 12/18/23   Lenor Hollering, MD  Misc. Devices MISC 1 home blood pressure monitor 01/06/21   Vicci Barnie NOVAK, MD  ondansetron  (ZOFRAN ) 4 MG tablet Take 1 tablet (4 mg total) by mouth daily as needed for nausea or vomiting. 04/12/22   Vicci Barnie NOVAK, MD  traZODone  (DESYREL ) 50 MG tablet TAKE 1/2 TABLET(25 MG) BY MOUTH AT BEDTIME 10/09/23   Vicci Barnie NOVAK, MD  triamcinolone  cream (KENALOG ) 0.1 % Apply  1 Application topically 2 (two) times daily. 01/15/23   Towana Ozell BROCKS, MD    Family History Family History  Problem Relation Age of Onset   Hypertension Mother    Hypertension Sister    Hypertension Brother     Social History Social History   Tobacco Use   Smoking status: Former    Current packs/day: 0.25    Types: Cigarettes   Smokeless tobacco: Never   Tobacco comments:    stopped May 2017  Vaping Use   Vaping status: Never Used  Substance Use Topics   Alcohol use: No    Comment: stopped 10-12 years ago   Drug use: Yes    Frequency: 7.0 times per week    Types: Marijuana     Allergies   Ibuprofen, Penicillins, and Latex   Review of Systems Review of Systems  Constitutional: Negative.   HENT:  Positive for dental problem.   Respiratory: Negative.    Cardiovascular: Negative.   Gastrointestinal: Negative.   Musculoskeletal: Negative.   Psychiatric/Behavioral: Negative.       Physical Exam Triage Vital Signs ED Triage Vitals  Encounter Vitals Group     BP 04/03/24 0823 (!) 141/94     Girls Systolic BP Percentile --      Girls Diastolic BP Percentile --      Boys Systolic BP Percentile --      Boys Diastolic BP Percentile --      Pulse Rate 04/03/24 0823 94     Resp 04/03/24 0823 16     Temp 04/03/24 0823 98.4 F (36.9 C)     Temp Source 04/03/24 0823 Oral     SpO2 04/03/24 0823 98 %     Weight --      Height --      Head Circumference --      Peak Flow --      Pain Score 04/03/24 0822 10     Pain Loc --      Pain Education --      Exclude from Growth Chart --    No data found.  Updated Vital Signs BP (!) 141/94 (BP Location: Left Arm)   Pulse 94   Temp 98.4 F (36.9 C) (Oral)   Resp 16   SpO2 98%   Visual Acuity Right Eye Distance:   Left Eye Distance:   Bilateral Distance:    Right Eye Near:   Left Eye Near:    Bilateral Near:     Physical Exam Constitutional:      Appearance: Normal appearance. He is normal weight.   HENT:     Nose: Nose normal.     Mouth/Throat:     Mouth: Mucous membranes are moist.     Comments: Right lower back  molar has filling in place;  Cardiovascular:     Rate and Rhythm: Normal rate and regular rhythm.  Pulmonary:     Effort: Pulmonary effort is normal.     Breath sounds: Normal breath sounds.  Neurological:     General: No focal deficit present.     Mental Status: He is alert.  Psychiatric:        Mood and Affect: Mood normal.      UC Treatments / Results  Labs (all labs ordered are listed, but only abnormal results are displayed) Labs Reviewed - No data to display  EKG   Radiology No results found.  Procedures Procedures (including critical care time)  Medications Ordered in UC Medications - No data to display  Initial Impression / Assessment and Plan / UC Course  I have reviewed the triage vital signs and the nursing notes.  Pertinent labs & imaging results that were available during my care of the patient were reviewed by me and considered in my medical decision making (see chart for details).   Final Clinical Impressions(s) / UC Diagnoses   Final diagnoses:  Pain, dental     Discharge Instructions      You were seen today for dental pain.  I have sent out an oral antibiotic for you today.  You may use tylenol /motrin for pain.  Please follow up with a dentist for further care and treatment.       [facility] Adult Primary Care, Pharmacy [address] [place], [place] [address] Visit Website [phone]  [facility] Adult Primary Care, Mental Health [address] [place], [place] [address] Visit Website [facility] [phone]  [facility] [address] [place] [address] Visit Website Leonce Elon Augusta Jeralyn Corinda [phone]  [facility] Adult Primary Care, Dental, Pharmacy, Mental Health [address] [place], [place] [address] Visit  Website Brunilda Tressa Joshua Juna [phone]  [facility] Adult Primary Care, Mental Health [address] [place], [place] [address] Visit Website Toney Bock, [place], [place], [place] [phone]  [facility] Adult Primary Care, Pharmacy, Mental Health [address] [place], [place] [address] Visit Website Madelynn Ardeen Derril Rudolpho [place], [place] [phone]  [facility] Adult Primary Care, Dental, Pharmacy [address], [place], [place], [place]  Visit Website Sarles [place], [place] [phone]  [facility] Adult Primary Care, Dental, Pharmacy [address], [place] [address] Visit Website Juna Joshua [phone]  [facility] [employer] [address] [place], [place] [address] Visit Website Falcon [phone]  [facility] Adult Primary Care [address] [place], [place] [address] Visit Website [place], [place], [place] [phone]  [facility] Adult Primary Care, Dental, Pharmacy, Mental Health [address] [place], [place] [address] Visit Website Erik Deter, [place] [phone]  [facility] Adult Primary Care, Mental Health [address] [place], [place] [address] Visit Website Mathis Kawasaki, Bennett Drew Meliton Merilee Andrey [phone]  [facility] Adult Primary Care, Dental, Pharmacy, Mental Health [address] [place], [place] [address] Visit Website [place], Eliodoro Bernardine Cypher, [place]  [phone]  [facility], Pharmacy [address] [place], [place] [address] Visit Website [place] Buster, Roslynn Leonce Jeralyn [phone]  [facility] [place] Adult Primary Care, Pharmacy, Mental Health [address] [place], [place] [address] Visit Website  [address], [place] Leonce Jeralyn Corinda [phone]  [facility] Adult Primary Care, Dental, Pharmacy [address] [place], [place] [address] Visit Website Liam Paige Romney [phone]  [facility] Adult Primary Care, Pharmacy, Mental Health [address] [place], [place] [address] Visit Website Lloyd Kristene Erik Raford [phone]  [facility] Adult Primary Care, Pharmacy [address] [place], [place] [address] Visit Website [place] [phone]  [facility] Adult Primary Care, Pharmacy [address] [place], [place] [address] Visit Website [place] [phone]  [facility]) Adult Primary Care, Dental [phone] [place], [place] [address] Visit Website Toney Paige [phone]  [facility] Pharmacy [address] [place], [place] [address] Visit Website Erik Razor [phone]  [facility] Adult Primary Care [address] [place], [place] [address] Visit Website [place], [place]osn, [place], [place], [place], [place] [phone]  [facility] [address] [facility] Adult Primary Care, Mental Health [address] [place], [place] [address] Visit Website Paige [phone]  [facility] Adult Primary Care, Pharmacy, Mental Health [address], [place] [address] Visit Website [place] [phone]  [facility] Adult Primary Care, Pharmacy, Mental Health [address] [place], [place] [address] Visit Website Charlott Mediate, [place] [phone]  [facility] Adult Primary Care, Pharmacy, Mental Health [address] [place], [place] [address] Visit Website Dann Marsa Mau, [address], [place], [place] Charline Debera Merilee Antonetta [phone]  [facility] [employer] Adult Primary Care, Dental, Pharmacy, Mental Health [address] [place], [place] [address] Visit  Website Katha Marsa Dann Mau [place], [place] [phone]  [facility] Adult Primary Care [address], [place] [address] Visit Website [place] [phone]  [facility] Adult Primary Care, Pharmacy, Mental Health [address] [place], [place] [address] Visit Website Candyce [phone]  Healing with LOYAL Hail. Dental, Mental Health  [address] [place], [place] [address] Visit Website [place] [phone]  [facility] [address] ([facility]) Adult Primary Care, Pharmacy [address] [place], [place] [address] Visit Website [place], [place], [place], [place], [place] Delice Sawyers [phone]  [facility] Pharmacy [address] [place], [place] [address] Visit Website [place] Toney Romney [phone]  [facility] Adult Primary Care, Pharmacy, Mental Health [address] [place], [place] [address] Visit Website Paige [phone]  [facility] Adult Primary Care, Pharmacy [address] [place] [address] Visit Website [place], [place] [phone]  [facility] Adult Primary Care, Pharmacy, Mental Health [address] [place], [place] [address] Visit Website Mau [phone]  [facility] Adult Primary Care, Pharmacy [address] [place], [place] [address] Visit Website Leonardo Ervin Rodney [phone]  [facility] Adult Primary Care, Pharmacy, Mental Health [address] [place], [place] [address] Visit Website Toney Maximino [place] Paige, [place] [phone]  [facility] Pharmacy [address] [place], [place] [address] Visit Website Mathis Bennett Serge [phone]  Nor[facility] MYRTIS [facility] Adult Primary Care, Pharmacy [address] [place], [place] [address] Visit Website [place] [phone]  [facility] Adult Primary Care, Mental  Health [address] [address] [place] [address] Visit Website June [phone]  [facility] Adult Primary Care, Dental, Pharmacy, Mental Health [address] [place],  [place] [address] Visit Website Toney Feather [phone]  [facility] Pharmacy [address] [place], [place] [address] Visit Website Lloyd Gibbs, Kristene Erik Raford [place], [place] [phone]  [facility] Adult Primary Care, Dental, Pharmacy [address] [place], [place] [address] Visit Website Tressa Joshua [place] [phone]  [place] [facility] Adult Primary Care, Pharmacy, Mental Health [address] [place], [place] [address] Visit Website Georgina Ashton Phlegm [phone]  [facility] Adult Primary Care, Mental Health [address] [place], [place] [address] Visit Website [place] [phone]  [facility] [address] [place], [place] [address] Visit Website [place], [place] 100 counties [phone]  [facility] Adult Primary Care, Mental Health [address] [place], [place] [address] Visit Website [place] [phone]  [facility] [address] [place], [place] [address] Visit Website Lannis Buys, Abdul Danie Lunger [phone]  [facility] Adult Primary Care, Pharmacy [address] [place], [place] [address] Visit Website [place], [place], [place], [place], [place] [phone]  [facility] [address] [place], [place] [address] Visit Website Tressa Buys, Brunilda Joshua [place], [place] [phone]  [facility] [address] [place], [place] [address] Visit Website [place] [phone]  [facility] Adult Primary Care, Pharmacy [address] [place], [place] [address] Visit Website Erik Kristene Elena Lloyd Paige,  [place], [place], Julian Massa [phone]  [facility] Adult Primary Care, Pharmacy [address], [place] [address] Visit Website [place] [phone]  [facility] Adult Primary Care, Pharmacy [address] [place], [place] [address] Visit Website June Rower [phone]  [facility] Adult Primary Care, Pharmacy, Mental Health [address] [place], [place] [address] Visit Website [place] [phone]  [employer] [facility] Adult Primary Care, Pharmacy, Mental Health [address] [place], [place], [place] [address] Visit Website [facility] [phone]  Adult Primary Care, Pharmacy, Mental Health [address] [place], [place] [address] Visit Website [address] [place], [place], [place], [place], [place], [place], [place], [place] [phone]  [facility] Adult Primary Care 1 Medical [address] [place], [place], [place] [address] Visit Website Erik Kristene, Elena, [address] [place], [place], [place], [place], Julian Andrey Massa [phone]  [facility] Dental [address] [place], [place] [address] Visit Website [facility] [phone]  [facility] Adult Primary Care, Pharmacy [address] [place], [place] [address] Visit Website Ensign [phone]     ED Prescriptions     Medication Sig Dispense Auth. Provider   clindamycin  (CLEOCIN ) 300 MG capsule Take 1 capsule (300 mg total) by mouth 3 (three) times daily for 7 days. 21 capsule Darral Longs, MD      PDMP not reviewed this encounter.   Darral Longs, MD 04/03/24 [phone]

## 2024-04-03 NOTE — Discharge Instructions (Signed)
 You were seen today for dental pain.  I have sent out an oral antibiotic for you today.  You may use tylenol /motrin for pain.  Please follow up with a dentist for further care and treatment.       [facility] Adult Primary Care, Pharmacy [address] [place], [place] [address] Visit Website [phone]  [facility] Adult Primary Care, Mental Health [address] [place], [place] [address] Visit Website [employer] [facility] [phone]  [facility] [address] [place] [address] Visit Website Leonce Elon Augusta Jeralyn Corinda [phone]  [facility] Adult [facility], Dental, Pharmacy, Mental Health [address] [place], [place] [address] Visit Website Brunilda Tressa Joshua Juna [phone]  [facility] Adult Primary Care, Mental Health [address] [place], [place] [address] Visit Website Toney Bock, [place], [place], [place] [phone]  [facility] Adult Primary Care, Pharmacy, Mental Health [address] [place], [place] [address] Visit Website Madelynn Ardeen Derril Rudolpho [place], [place] [phone]  [facility] Adult Primary Care, Dental, Pharmacy [address] [place], [place], [place]  Visit Website [place], [place] [phone]  [facility] Adult Primary Care, Dental, Pharmacy [address] [place] [address], [place] [address] Visit Website Juna Joshua [phone]  [facility] [address] [place], [place] [address] Visit Website [place] [phone]  [facility] Adult Primary Care [address] [place], [place] [address] Visit Website [place], [place], [place] [phone]  [facility] Adult Primary Care, Dental, Pharmacy, Mental Health [address] [place], [place] [address] Visit Website Erik Deter,  [place] [phone]  [facility] Adult Primary Care, Mental Health [address] [place], [place] [address] Visit Website [place], [place], Bennett Valery Meliton Merilee Andrey [phone]  [facility] Adult Primary Care, Dental, Pharmacy, Mental Health [address] [place]oreene [place], [place] [address] Visit Website [place], Eliodoro Bernardine Cypher, [place]  [phone]  [facility], Pharmacy [address] [place], [place] [address] Visit Website Augusta Buster, Roslynn Leonce Jeralyn [phone]  [facility] Adult Primary Care, Pharmacy, Mental Health [address] [place], [place] [address] Visit Website Augusta Elon Charlott Leonce Jeralyn Corinda [phone]  [facility] Adult Primary Care, Dental, Pharmacy [address] [place], [place] [address] Visit Website Liam Paige Romney [phone]  [facility] Adult Primary Care, Pharmacy, Mental Health [address] [place], [place] [address] Visit Website Lloyd Kristene Erik [place] [phone]  [facility] Adult Primary Care, Pharmacy [address] [place], [place] [address] Visit Website [place] [phone]  [facility] Adult Primary Care, Pharmacy [address] [place], [place] [address] Visit Website Gregory [place] [phone]  [facility] [place] [address] [place] [facility]) Adult Primary Care, Dental [phone] [place], [place] [address] Visit Website Toney Paige [phone]  [facility] Pharmacy [address] [place], [place] [address] Visit Website Erik Razor [phone]  [facility] Adult Primary Care [address] [place], [place] [address] Visit Website [place], [place], [place], [place], [place], [place] [phone]  [facility] [address] [facility] Adult Primary Care, Mental Health [address] [place], [place] [address] Visit Website Paige [phone]  [facility] Adult Primary Care, Pharmacy, Mental Health [address], [place] [address] Visit Website  [place] [phone]  [facility] Adult Primary Care, Pharmacy, Mental Health [address] [place], [place] [address] Visit Website Charlott Mediate, [employer] [phone]  [facility] Adult Primary Care, Pharmacy, Mental Health [address] [place], [place] [address] Visit Website Dann Marsa Mau, [address], [place], Euell Charline Debera Merilee Antonetta [phone]  [facility] [employer] Adult Primary Care, Dental, Pharmacy, Mental Health [address] [place], [place] [address] Visit Website Katha Marsa Dann Mau [place], [place] [phone]  [facility] Adult Primary Care [address], [place] [address] Visit Website [place] [phone]  [facility] Adult Primary Care, Pharmacy, Mental Health [address] [place], [place] [address] Visit Website Candyce [phone]  [facility]aling with LOYAL Hail. Dental, Mental Health  [address] [place], [place] [address] Visit Website [place] [phone]  [facility] [address] ([facility]) Adult Primary Care, Pharmacy [address], [place] [address] Visit Website [place], [place], [place], [place], [place] Delice Sawyers [phone]  [facility] [address], [place] [address] Visit Website Gregory Hollis Toney Dalene [phone]  [facility] Adult Primary Care, Pharmacy, Mental Health [address] [place], [place] [address] Visit Website Paige [phone]  [facility] Adult Primary Care, Pharmacy [address] [place] [address] Visit Website [place], [place] [phone]  [facility] Adult Primary Care, Pharmacy, Mental Health [address] [place], [place] [address] Visit Website Mau [phone]  [facility] Adult Primary Care, Pharmacy [address] [place], [place] [address] Visit Website Leonardo Ervin Rodney [phone]  [facility] Adult Primary Care, Pharmacy, Mental Health [address] [place], [place] [address] Visit Website Toney Maximino Euell Paige, [place] [phone]  [facility] Pharm[facility] [address] [place], [place] [address] Visit Website Mathis Bennett Serge [phone]  [facility]RT[facility] Adult Primary Care, Pharmacy [address] [place], [place] [address] Visit Website [place] [phone]  [facility] Adult Primary Care, Mental Health [address] [address] [place] [address] Visit Website June [phone]  [facility] Adult Primary Care, Dental, Pharmacy, Mental Health [address], [place] [address] Visit Website [place], [place] [phone]  [facility] Pharmacy [address] [place], [place] [address] Visit Website Lloyd Gibbs, Kristene Erik [place]ord [place], [place] [phone]  [facility] Adult Primary Care, Dental, Pharmacy [address] [place], [place] [address] Visit Website Rodney Joshua Leonardo [phone]  [facility] Adult Primary Care, Pharmacy, Mental Health [address] [place], [place] [address] Visit Website Georgina Ashton Phlegm [phone]  [facility] Adult Primary Care, Mental Health [address] [place], [place] [address] Visit Website [place] [phone]  [facility] [address] [place], [place] [address] Visit Website [place], [place] 100 counties [phone]  [facility] Adult Primary Care, Mental Health [address] [place], [place] [address] Visit  Website [place] [phone]  [facility] [address] [place], [place] [address] Visit Website [place] Ervin Abdul Danie Gladis [phone]  [facility] Adult Primary Care, Pharmacy [address] [place], [place] [address] Visit Website [place], [place], [place], [place], [place] [phone]  [facility] [address] [place], [place] [address] Visit Website [place],  [address], [place], Joshua [place], [place] [phone]  [facility] Pharm[facility] [address] [place], [place] [address] Visit Website [place] [phone]  [facility] Adult Primary Care, Pharmacy [address] [place], [place] [address] Visit Website Erik Bunde, Elena, [address] [place], [place], [place], [place] Massa [phone]  [facility] Adult Primary Care, Pharmacy [address], [place] [address] Visit Website [place] [phone]  [facility] Adult Primary Care, Pharmacy [address] [place] [place], [place] [address] Visit Website June Rower [phone]  [facility] Adult Primary Care, Pharmacy, Mental Health [address] [place], [place] [address] Visit Website [place] [phone]  [facility] Adult Primary Care, Pharmacy, Mental Health [address] [place], [place], [place] [address] Visit Website [facility] [phone]  Adult Primary Care, Pharmacy, Mental Health [address] [place], [place] [address] Visit Website [place], [place], [place], [place], [place], [place], [place], [place] [phone]  [facility] Adult Primary Care 1 Medical [address] [place], [place], [place] [address] Visit Website Erik Bunde, Elena, [address], [place], [place], [place], [place], [place] Andrey Massa [phone]  [facility] [address] [place], [place] [address] Visit Website [facility] [phone]  [facility] Adult Primary Care, Pharmacy [address] [place], [place] [address] Visit Website [place] [phone]

## 2024-04-03 NOTE — ED Triage Notes (Signed)
 Pt c/o right posterior dental pain that been a while but got worse this week. Now causing ear and right side of head pain. Tried Tylenol 

## 2024-06-16 ENCOUNTER — Emergency Department (HOSPITAL_COMMUNITY)
Admission: EM | Admit: 2024-06-16 | Discharge: 2024-06-17 | Disposition: A | Attending: Emergency Medicine | Admitting: Emergency Medicine

## 2024-06-16 DIAGNOSIS — I10 Essential (primary) hypertension: Secondary | ICD-10-CM | POA: Diagnosis not present

## 2024-06-16 DIAGNOSIS — Z21 Asymptomatic human immunodeficiency virus [HIV] infection status: Secondary | ICD-10-CM | POA: Diagnosis not present

## 2024-06-16 DIAGNOSIS — R1030 Lower abdominal pain, unspecified: Secondary | ICD-10-CM | POA: Insufficient documentation

## 2024-06-16 DIAGNOSIS — R1032 Left lower quadrant pain: Secondary | ICD-10-CM | POA: Diagnosis not present

## 2024-06-16 DIAGNOSIS — R109 Unspecified abdominal pain: Secondary | ICD-10-CM

## 2024-06-16 MED ORDER — MORPHINE SULFATE (PF) 4 MG/ML IV SOLN
4.0000 mg | Freq: Once | INTRAVENOUS | Status: AC
  Administered 2024-06-16: 4 mg via INTRAVENOUS
  Filled 2024-06-16: qty 1

## 2024-06-16 MED ORDER — ONDANSETRON HCL 4 MG/2ML IJ SOLN
4.0000 mg | Freq: Once | INTRAMUSCULAR | Status: AC
  Administered 2024-06-16: 4 mg via INTRAVENOUS
  Filled 2024-06-16: qty 2

## 2024-06-16 MED ORDER — LACTATED RINGERS IV BOLUS
1000.0000 mL | Freq: Once | INTRAVENOUS | Status: AC
  Administered 2024-06-16: 1000 mL via INTRAVENOUS

## 2024-06-16 NOTE — ED Provider Notes (Signed)
 [place] EMERGENCY DEPARTMENT AT [facility] Provider Note   CSN: [id number] Arrival date & time: 06/16/24  2259     History Chief Complaint  Patient presents with   Abdominal Pain    HPI Drew Cuevas is a 53 y.o. male presenting for chief complaint of abdominal pain.  54 year old male history of HIV compliant on medication.  States he has a history of recurrent diverticulitis.  Left lower quadrant pain for 3 days.  Worsening tonight.  Subjective fevers.  No nausea vomiting.  Is endorsing diarrhea.  Otherwise ambulatory tolerating p.o. intake. Patient's recorded medical, surgical, social, medication list and allergies were reviewed in the Snapshot window as part of the initial history.   Review of Systems   Review of Systems  Constitutional:  Negative for chills and fever.  HENT:  Negative for ear pain and sore throat.   Eyes:  Negative for pain and visual disturbance.  Respiratory:  Negative for cough and shortness of breath.   Cardiovascular:  Negative for chest pain and palpitations.  Gastrointestinal:  Positive for abdominal pain, diarrhea and nausea. Negative for vomiting.  Genitourinary:  Negative for dysuria and hematuria.  Musculoskeletal:  Negative for arthralgias and back pain.  Skin:  Negative for color change and rash.  Neurological:  Negative for seizures and syncope.  All other systems reviewed and are negative.   Physical Exam Updated Vital Signs BP 124/85   Pulse 69   Temp 98.1 F (36.7 C)   Resp 16   Ht 5' 5 (1.651 m)   Wt 104.3 kg   SpO2 100%   BMI 38.27 kg/m  Physical Exam Vitals and nursing note reviewed.  Constitutional:      General: He is not in acute distress.    Appearance: He is well-developed.  HENT:     Head: Normocephalic and atraumatic.  Eyes:     Conjunctiva/sclera: Conjunctivae normal.  Cardiovascular:     Rate and Rhythm: Normal rate and regular rhythm.     Heart sounds: No murmur heard. Pulmonary:     Effort:  Pulmonary effort is normal. No respiratory distress.     Breath sounds: Normal breath sounds.  Abdominal:     Palpations: Abdomen is soft.     Tenderness: There is abdominal tenderness. There is no guarding.  Musculoskeletal:        General: No swelling.     Cervical back: Neck supple.  Skin:    General: Skin is warm and dry.     Capillary Refill: Capillary refill takes less than 2 seconds.  Neurological:     Mental Status: He is alert.  Psychiatric:        Mood and Affect: Mood normal.      ED Course/ Medical Decision Making/ A&P    Procedures Procedures   Medications Ordered in ED Medications  oxyCODONE  (Oxy IR/ROXICODONE ) immediate release tablet 5 mg (has no administration in time range)  acetaminophen  (TYLENOL ) tablet 1,000 mg (has no administration in time range)  lactated ringers  bolus 1,000 mL (0 mLs Intravenous Stopped 06/17/24 0323)  ondansetron  (ZOFRAN ) injection 4 mg (4 mg Intravenous Given 06/16/24 2353)  morphine  (PF) 4 MG/ML injection 4 mg (4 mg Intravenous Given 06/16/24 2353)  iohexol  (OMNIPAQUE ) 300 MG/ML solution 100 mL (100 mLs Intravenous Contrast Given 06/17/24 0206)  ciprofloxacin  (CIPRO ) tablet 500 mg (500 mg Oral Given 06/17/24 0323)  metroNIDAZOLE  (FLAGYL ) tablet 500 mg (500 mg Oral Given 06/17/24 0323)   Medical Decision Making:   Drew  Cuevas is a 53 y.o. male who presented to the ED today with abdominal pain, detailed above.    Additional history discussed with patient's family/caregivers.  Patient placed on continuous vitals and telemetry monitoring while in ED which was reviewed periodically.  Complete initial physical exam performed, notably the patient  was HDS in NAD.     Reviewed and confirmed nursing documentation for past medical history, family history, social history.    Initial Assessment:   With the patient's presentation of abdominal pain, most likely diagnosis is nonspecific etiology. Other diagnoses were considered including  (but not limited to) gastroenteritis, colitis, small bowel obstruction, appendicitis, cholecystitis, pancreatitis, nephrolithiasis, UTI, pyleonephritis. These are considered less likely due to history of present illness and physical exam findings.   This is most consistent with an acute life/limb threatening illness complicated by underlying chronic conditions.   Initial Plan:  CBC/CMP to evaluate for underlying infectious/metabolic etiology for patient's abdominal pain  Lipase to evaluate for pancreatitis  EKG to evaluate for cardiac source of pain  CTAB/Pelvis with contrast to evaluate for structural/surgical etiology of patients' severe abdominal pain.  Urinalysis and repeat physical assessment to evaluate for UTI/Pyelonpehritis  Empiric management of symptoms with escalating pain control and antiemetics as needed.   Initial Study Results:   Laboratory  All laboratory results reviewed without evidence of clinically relevant pathology.    EKG EKG was reviewed independently. Rate, rhythm, axis, intervals all examined and without medically relevant abnormality. ST segments without concerns for elevations.    Radiology All images reviewed independently. Agree with radiology report at this time.   CT ABDOMEN PELVIS W CONTRAST Result Date: 06/17/2024 EXAM: CT ABDOMEN AND PELVIS WITH CONTRAST 06/17/2024 02:24:24 AM TECHNIQUE: CT of the abdomen and pelvis was performed with the administration of 100 mL of iohexol  (OMNIPAQUE ) 300 MG/ML solution. Multiplanar reformatted images are provided for review. Automated exposure control, iterative reconstruction, and/or weight-based adjustment of the mA/kV was utilized to reduce the radiation dose to as low as reasonably achievable. COMPARISON: None available. CLINICAL HISTORY: Abdominal pain, acute, nonlocalized. FINDINGS: LOWER CHEST: No acute abnormality. LIVER: More hepatic steatosis. No enhancing intrahepatic mass. Scattered hepatic hypodensities are seen  which are too small to accurately characterize but likely represent tiny hepatic cysts. GALLBLADDER AND BILE DUCTS: Gallbladder is unremarkable. No biliary ductal dilatation. SPLEEN: No acute abnormality. PANCREAS: No acute abnormality. ADRENAL GLANDS: No acute abnormality. KIDNEYS, URETERS AND BLADDER: Simple cortical cysts seen within the left kidney for which no follow up imaging is recommended. Kidneys are otherwise unremarkable. No stones in the kidneys or ureters. No hydronephrosis. No perinephric or periureteral stranding. Urinary bladder is unremarkable. GI AND BOWEL: Severe sigmoid diverticulosis. Superimposed focal bowel wall thickening and pericolonic inflammatory stranding in keeping changes of acute, uncomplicated sigmoid diverticulitis. No evidence of obstruction or perforation. No loculated pericolonic fluid collections. The stomach, small bowel, and large bowel are otherwise unremarkable. Appendix normal. PERITONEUM AND RETROPERITONEUM: No ascites. No free air. VASCULATURE: Aorta is normal in caliber. LYMPH NODES: No lymphadenopathy. REPRODUCTIVE ORGANS: No acute abnormality. BONES AND SOFT TISSUES: Tiny fat-containing umbilical hernia. No acute osseous abnormality. No focal soft tissue abnormality. IMPRESSION: 1. Acute, uncomplicated sigmoid diverticulitis without obstruction, perforation, or loculated pericolonic fluid collection 2. Hepatic steatosis Electronically signed by: Dorethia Molt MD 06/17/2024 03:00 AM EST RP Workstation: [id number]     Final Reassessment and Plan:   Diverticulitis. No acute distress.  CTAP without perf/abscess. Medications sent to pharmacy.       Clinical Impression:  1. Abdominal pain, unspecified abdominal location      Data Unavailable   Final Clinical Impression(s) / ED Diagnoses Final diagnoses:  Abdominal pain, unspecified abdominal location    Rx / DC Orders ED Discharge Orders          Ordered    acetaminophen  (TYLENOL ) 500 MG tablet   Every 8 hours PRN        06/17/24 0326    ciprofloxacin  (CIPRO ) 500 MG tablet  Every 12 hours        06/17/24 0326    metroNIDAZOLE  (FLAGYL ) 500 MG tablet  2 times daily        06/17/24 0326    oxyCODONE  (ROXICODONE ) 5 MG immediate release tablet  Every 6 hours PRN        06/17/24 0326              Jerral Meth, MD 06/17/24 [phone]

## 2024-06-16 NOTE — ED Provider Notes (Incomplete)
  [place] EMERGENCY DEPARTMENT AT [facility] Provider Note   CSN: [id number] Arrival date & time: 06/16/24  2259     History Chief Complaint  Patient presents with  . Abdominal Pain    HPI Drew Cuevas is a 53 y.o. male presenting for chief complaint of abdominal pain for the past 48 hours.  53 year old male, history of renal cell carcinoma, HLD, CVA, diabetes, HIV but history of diverticulitis a few years ago.  States that he had multiple bowel movements today that were loose, severe cramping sensation in his abdomen.  Denies fevers chills nausea vomiting, syncope shortness of breath.  He is otherwise ambulatory but very poor p.o. tolerance today because of fear of worsening pain.  No other medications prior to arrival..  Patient's recorded medical, surgical, social, medication list and allergies were reviewed in the Snapshot window as part of the initial history.   Review of Systems   Review of Systems  Physical Exam Updated Vital Signs BP (!) 134/92 (BP Location: Left Arm)   Pulse 83   Temp 98 F (36.7 C) (Oral)   Resp 17   Ht 5' 5 (1.651 m)   Wt 104.3 kg   SpO2 99%   BMI 38.27 kg/m  Physical Exam   ED Course/ Medical Decision Making/ A&P    Procedures Procedures   Medications Ordered in ED Medications - No data to display  Medical Decision Making:   Drew Cuevas is a 53 y.o. male who presented to the ED today with *** detailed above.    {crccomplexity:27900} Complete initial physical exam performed, notably the patient  was ***.    Reviewed and confirmed nursing documentation for past medical history, family history, social history.    Initial Assessment:   With the patient's presentation of ***, most likely diagnosis is ***. Other diagnoses were considered including (but not limited to) ***. These are considered less likely due to history of present illness and physical exam findings.   {crccopa:27899}  Initial Plan:  ***  ***Screening  labs including CBC and Metabolic panel to evaluate for infectious or metabolic etiology of disease.  ***Urinalysis with reflex culture ordered to evaluate for UTI or relevant urologic/nephrologic pathology.  ***CXR to evaluate for structural/infectious intrathoracic pathology.  {crccardiactesting:32591::EKG to evaluate for cardiac pathology} Objective evaluation as below reviewed   Initial Study Results:   Laboratory  All laboratory results reviewed without evidence of clinically relevant pathology.   ***Exceptions include: ***   ***EKG EKG was reviewed independently. Rate, rhythm, axis, intervals all examined and without medically relevant abnormality. ST segments without concerns for elevations.    Radiology:  All images reviewed independently. ***Agree with radiology report at this time.   No results found.    Consults: Case discussed with ***.   Reassessment and Plan:   ***    ***  Clinical Impression: No diagnosis found.   Data Unavailable   Final Clinical Impression(s) / ED Diagnoses Final diagnoses:  None    Rx / DC Orders ED Discharge Orders     None

## 2024-06-16 NOTE — ED Triage Notes (Signed)
 Patient brought in by POV with c/o lower abdominal pain that started yesterday at 10am. Patient has a history of diverticulitis.

## 2024-06-17 ENCOUNTER — Emergency Department (HOSPITAL_COMMUNITY)

## 2024-06-17 ENCOUNTER — Encounter (HOSPITAL_COMMUNITY): Payer: Self-pay

## 2024-06-17 LAB — COMPREHENSIVE METABOLIC PANEL WITH GFR
ALT: 51 U/L — ABNORMAL HIGH (ref 0–44)
AST: 32 U/L (ref 15–41)
Albumin: 3.7 g/dL (ref 3.5–5.0)
Alkaline Phosphatase: 109 U/L (ref 38–126)
Anion gap: 8 (ref 5–15)
BUN: 12 mg/dL (ref 6–20)
CO2: 25 mmol/L (ref 22–32)
Calcium: 8.8 mg/dL — ABNORMAL LOW (ref 8.9–10.3)
Chloride: 110 mmol/L (ref 98–111)
Creatinine, Ser: 1.39 mg/dL — ABNORMAL HIGH (ref 0.61–1.24)
GFR, Estimated: >60 mL/min (ref >60)
Glucose, Bld: 75 mg/dL (ref 70–99)
Potassium: 3.8 mmol/L (ref 3.5–5.1)
Sodium: 142 mmol/L (ref 135–145)
Total Bilirubin: 0.2 mg/dL (ref 0.0–1.2)
Total Protein: 6.5 g/dL (ref 6.5–8.1)

## 2024-06-17 LAB — URINALYSIS, ROUTINE W REFLEX MICROSCOPIC
Bilirubin Urine: NEGATIVE
Glucose, UA: NEGATIVE mg/dL
Hgb urine dipstick: NEGATIVE
Ketones, ur: NEGATIVE mg/dL
Leukocytes,Ua: NEGATIVE
Nitrite: NEGATIVE
Protein, ur: NEGATIVE mg/dL
Specific Gravity, Urine: 1.017 (ref 1.005–1.030)
pH: 6 (ref 5.0–8.0)

## 2024-06-17 LAB — CBC
HCT: 40.1 % (ref 39.0–52.0)
Hemoglobin: 13.6 g/dL (ref 13.0–17.0)
MCH: 32.3 pg (ref 26.0–34.0)
MCHC: 33.9 g/dL (ref 30.0–36.0)
MCV: 95.2 fL (ref 80.0–100.0)
Platelets: 191 K/uL (ref 150–400)
RBC: 4.21 MIL/uL — ABNORMAL LOW (ref 4.22–5.81)
RDW: 13.2 % (ref 11.5–15.5)
WBC: 8.1 K/uL (ref 4.0–10.5)
nRBC: 0 % (ref 0.0–0.2)

## 2024-06-17 LAB — LIPASE, BLOOD: Lipase: 78 U/L — ABNORMAL HIGH (ref 11–51)

## 2024-06-17 MED ORDER — METRONIDAZOLE 500 MG PO TABS: 500.0000 mg | ORAL_TABLET | Freq: Two times a day (BID) | ORAL | 0 refills | Status: AC

## 2024-06-17 MED ORDER — OXYCODONE HCL 5 MG PO TABS
5.0000 mg | ORAL_TABLET | Freq: Once | ORAL | Status: DC
  Filled 2024-06-17: qty 1

## 2024-06-17 MED ORDER — CIPROFLOXACIN HCL 500 MG PO TABS
500.0000 mg | ORAL_TABLET | Freq: Once | ORAL | Status: AC
  Administered 2024-06-17: 500 mg via ORAL
  Filled 2024-06-17: qty 1

## 2024-06-17 MED ORDER — ACETAMINOPHEN 500 MG PO TABS: 1000.0000 mg | ORAL_TABLET | Freq: Three times a day (TID) | ORAL | 0 refills | Status: AC | PRN

## 2024-06-17 MED ORDER — IOHEXOL 300 MG/ML  SOLN
100.0000 mL | Freq: Once | INTRAMUSCULAR | Status: AC | PRN
  Administered 2024-06-17: 100 mL via INTRAVENOUS

## 2024-06-17 MED ORDER — CIPROFLOXACIN HCL 500 MG PO TABS
500.0000 mg | ORAL_TABLET | Freq: Two times a day (BID) | ORAL | 0 refills | Status: AC
Start: 2024-06-17 — End: ?

## 2024-06-17 MED ORDER — OXYCODONE HCL 5 MG PO TABS: 5.0000 mg | ORAL_TABLET | Freq: Four times a day (QID) | ORAL | 0 refills | Status: AC | PRN

## 2024-06-17 MED ORDER — ACETAMINOPHEN 500 MG PO TABS
1000.0000 mg | ORAL_TABLET | Freq: Once | ORAL | Status: DC
  Filled 2024-06-17: qty 2

## 2024-06-17 MED ORDER — METRONIDAZOLE 500 MG PO TABS
500.0000 mg | ORAL_TABLET | Freq: Once | ORAL | Status: AC
  Administered 2024-06-17: 500 mg via ORAL
  Filled 2024-06-17: qty 1
